# Patient Record
Sex: Female | Born: 1970
Health system: Southern US, Community
[De-identification: ages and names within clinical notes are randomized; demographics above are authoritative.]

## PROBLEM LIST (undated history)

## (undated) DIAGNOSIS — F32A Depression, unspecified: Secondary | ICD-10-CM

## (undated) DIAGNOSIS — M549 Dorsalgia, unspecified: Secondary | ICD-10-CM

## (undated) DIAGNOSIS — E559 Vitamin D deficiency, unspecified: Secondary | ICD-10-CM

## (undated) DIAGNOSIS — K219 Gastro-esophageal reflux disease without esophagitis: Secondary | ICD-10-CM

## (undated) DIAGNOSIS — T7840XA Allergy, unspecified, initial encounter: Secondary | ICD-10-CM

## (undated) DIAGNOSIS — K589 Irritable bowel syndrome without diarrhea: Secondary | ICD-10-CM

## (undated) DIAGNOSIS — N83209 Unspecified ovarian cyst, unspecified side: Secondary | ICD-10-CM

## (undated) DIAGNOSIS — Z15068 Genetic susceptibility to other malignant neoplasm of digestive system: Secondary | ICD-10-CM

## (undated) DIAGNOSIS — F329 Major depressive disorder, single episode, unspecified: Secondary | ICD-10-CM

## (undated) DIAGNOSIS — R600 Localized edema: Secondary | ICD-10-CM

## (undated) DIAGNOSIS — G43909 Migraine, unspecified, not intractable, without status migrainosus: Secondary | ICD-10-CM

## (undated) DIAGNOSIS — J45909 Unspecified asthma, uncomplicated: Secondary | ICD-10-CM

## (undated) DIAGNOSIS — C801 Malignant (primary) neoplasm, unspecified: Secondary | ICD-10-CM

## (undated) DIAGNOSIS — F419 Anxiety disorder, unspecified: Secondary | ICD-10-CM

## (undated) DIAGNOSIS — E669 Obesity, unspecified: Secondary | ICD-10-CM

## (undated) DIAGNOSIS — R0600 Dyspnea, unspecified: Secondary | ICD-10-CM

## (undated) DIAGNOSIS — K760 Fatty (change of) liver, not elsewhere classified: Secondary | ICD-10-CM

## (undated) DIAGNOSIS — Z8041 Family history of malignant neoplasm of ovary: Secondary | ICD-10-CM

## (undated) DIAGNOSIS — K8020 Calculus of gallbladder without cholecystitis without obstruction: Secondary | ICD-10-CM

## (undated) DIAGNOSIS — Z1509 Genetic susceptibility to other malignant neoplasm: Secondary | ICD-10-CM

## (undated) DIAGNOSIS — Z803 Family history of malignant neoplasm of breast: Secondary | ICD-10-CM

## (undated) DIAGNOSIS — Z808 Family history of malignant neoplasm of other organs or systems: Secondary | ICD-10-CM

## (undated) HISTORY — PX: COLONOSCOPY: SHX174

## (undated) HISTORY — DX: Irritable bowel syndrome, unspecified: K58.9

## (undated) HISTORY — DX: Anxiety disorder, unspecified: F41.9

## (undated) HISTORY — DX: Vitamin D deficiency, unspecified: E55.9

## (undated) HISTORY — PX: POLYPECTOMY: SHX149

## (undated) HISTORY — DX: Unspecified asthma, uncomplicated: J45.909

## (undated) HISTORY — DX: Family history of malignant neoplasm of breast: Z80.3

## (undated) HISTORY — DX: Calculus of gallbladder without cholecystitis without obstruction: K80.20

## (undated) HISTORY — DX: Migraine, unspecified, not intractable, without status migrainosus: G43.909

## (undated) HISTORY — DX: Family history of malignant neoplasm of ovary: Z80.41

## (undated) HISTORY — PX: WISDOM TOOTH EXTRACTION: SHX21

## (undated) HISTORY — DX: Dyspnea, unspecified: R06.00

## (undated) HISTORY — DX: Localized edema: R60.0

## (undated) HISTORY — DX: Allergy, unspecified, initial encounter: T78.40XA

## (undated) HISTORY — DX: Family history of malignant neoplasm of other organs or systems: Z80.8

## (undated) HISTORY — DX: Dorsalgia, unspecified: M54.9

## (undated) HISTORY — DX: Fatty (change of) liver, not elsewhere classified: K76.0

## (undated) HISTORY — DX: Obesity, unspecified: E66.9

---

## 1999-06-24 ENCOUNTER — Inpatient Hospital Stay (HOSPITAL_COMMUNITY): Admission: EM | Admit: 1999-06-24 | Discharge: 1999-06-28 | Payer: Self-pay | Admitting: *Deleted

## 1999-07-29 ENCOUNTER — Encounter: Payer: Self-pay | Admitting: Emergency Medicine

## 1999-07-29 ENCOUNTER — Emergency Department (HOSPITAL_COMMUNITY): Admission: EM | Admit: 1999-07-29 | Discharge: 1999-07-29 | Payer: Self-pay | Admitting: Emergency Medicine

## 1999-10-02 ENCOUNTER — Emergency Department (HOSPITAL_COMMUNITY): Admission: EM | Admit: 1999-10-02 | Discharge: 1999-10-02 | Payer: Self-pay

## 1999-12-08 ENCOUNTER — Emergency Department (HOSPITAL_COMMUNITY): Admission: EM | Admit: 1999-12-08 | Discharge: 1999-12-08 | Payer: Self-pay | Admitting: Emergency Medicine

## 2000-06-15 ENCOUNTER — Inpatient Hospital Stay (HOSPITAL_COMMUNITY): Admission: AD | Admit: 2000-06-15 | Discharge: 2000-06-15 | Payer: Self-pay | Admitting: Obstetrics and Gynecology

## 2000-06-15 ENCOUNTER — Encounter: Payer: Self-pay | Admitting: Obstetrics and Gynecology

## 2000-10-20 ENCOUNTER — Other Ambulatory Visit: Admission: RE | Admit: 2000-10-20 | Discharge: 2000-10-20 | Payer: Self-pay | Admitting: Obstetrics and Gynecology

## 2000-10-25 ENCOUNTER — Emergency Department (HOSPITAL_COMMUNITY): Admission: EM | Admit: 2000-10-25 | Discharge: 2000-10-26 | Payer: Self-pay | Admitting: Emergency Medicine

## 2000-10-26 ENCOUNTER — Encounter: Payer: Self-pay | Admitting: Emergency Medicine

## 2000-11-07 ENCOUNTER — Ambulatory Visit (HOSPITAL_COMMUNITY): Admission: RE | Admit: 2000-11-07 | Discharge: 2000-11-07 | Payer: Self-pay | Admitting: Obstetrics and Gynecology

## 2000-11-07 ENCOUNTER — Encounter: Payer: Self-pay | Admitting: Obstetrics and Gynecology

## 2001-11-21 ENCOUNTER — Other Ambulatory Visit: Admission: RE | Admit: 2001-11-21 | Discharge: 2001-11-21 | Payer: Self-pay | Admitting: Obstetrics and Gynecology

## 2003-03-10 ENCOUNTER — Other Ambulatory Visit: Admission: RE | Admit: 2003-03-10 | Discharge: 2003-03-10 | Payer: Self-pay | Admitting: Obstetrics and Gynecology

## 2003-04-17 ENCOUNTER — Inpatient Hospital Stay (HOSPITAL_COMMUNITY): Admission: AD | Admit: 2003-04-17 | Discharge: 2003-04-17 | Payer: Self-pay | Admitting: Obstetrics and Gynecology

## 2003-05-05 ENCOUNTER — Ambulatory Visit (HOSPITAL_COMMUNITY): Admission: RE | Admit: 2003-05-05 | Discharge: 2003-05-05 | Payer: Self-pay | Admitting: Obstetrics and Gynecology

## 2003-05-05 IMAGING — US US OB COMP +14 WK
1 series · 13 of 28 positions shown · non-contrast
Comparison: none

CLINICAL DATA: Assess fetal anatomy.

[Series 1: unknown · 0.28mm/px · 13 of 81 slices shown]
[im 3/81]
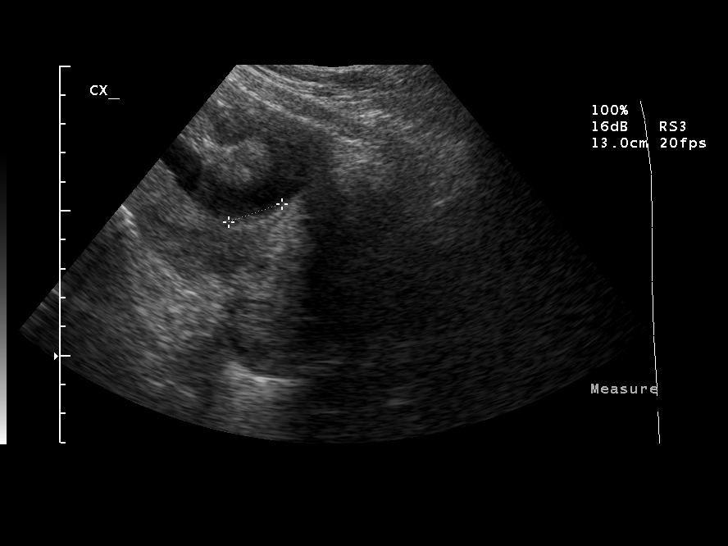
[im 9/81]
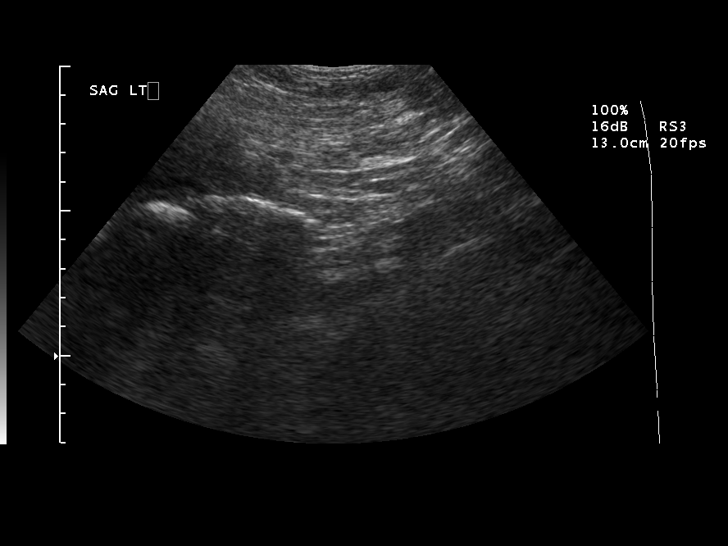
[im 15/81]
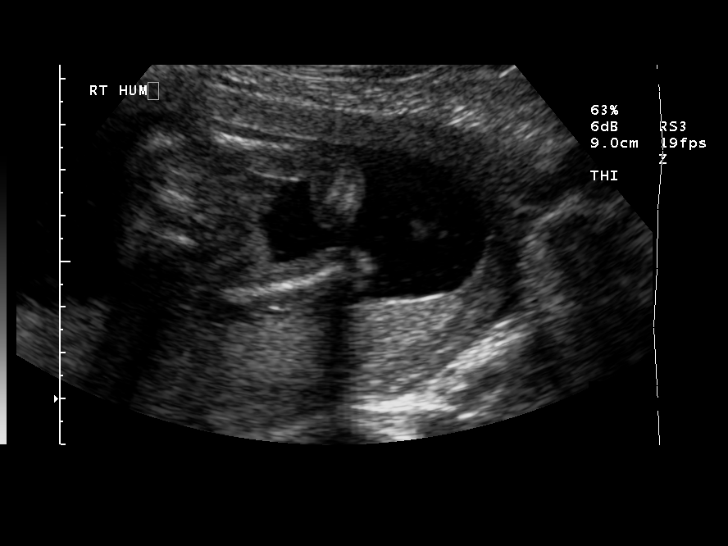
[im 21/81]
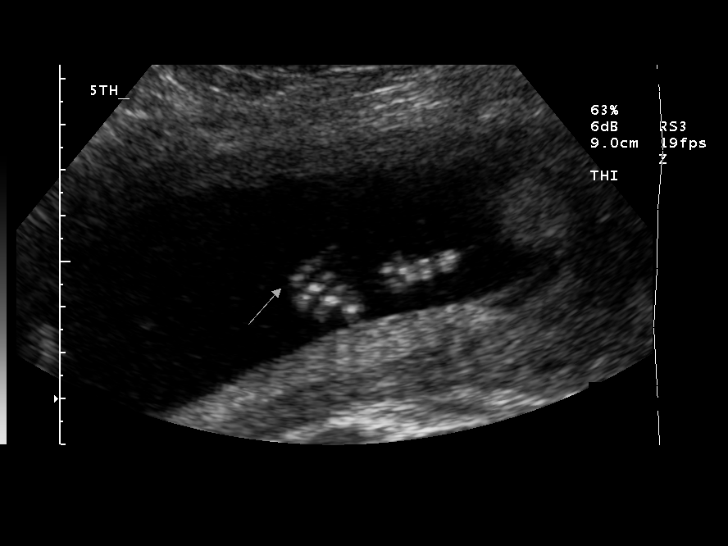
[im 27/81]
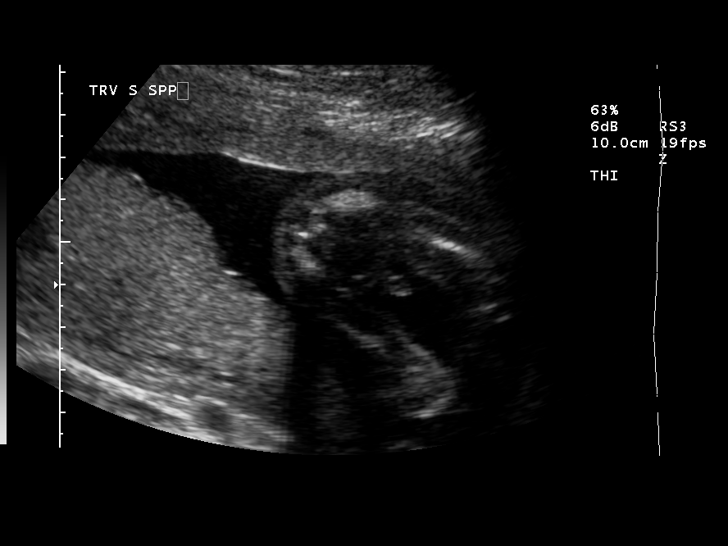
[im 33/81]
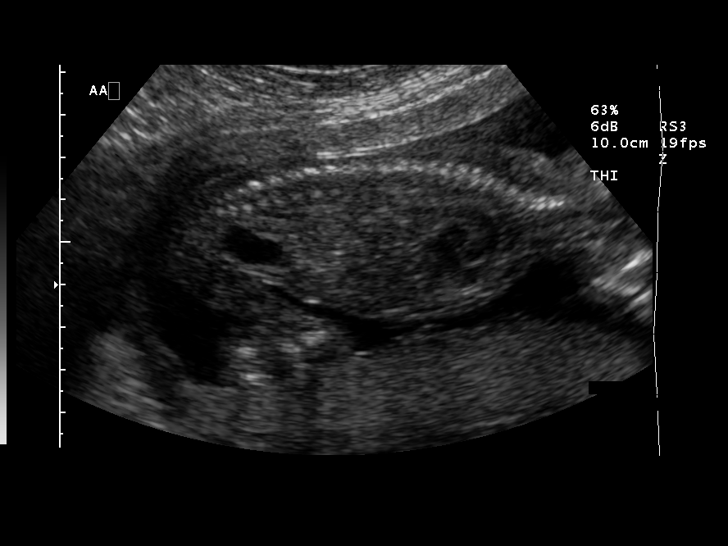
[im 42/81]
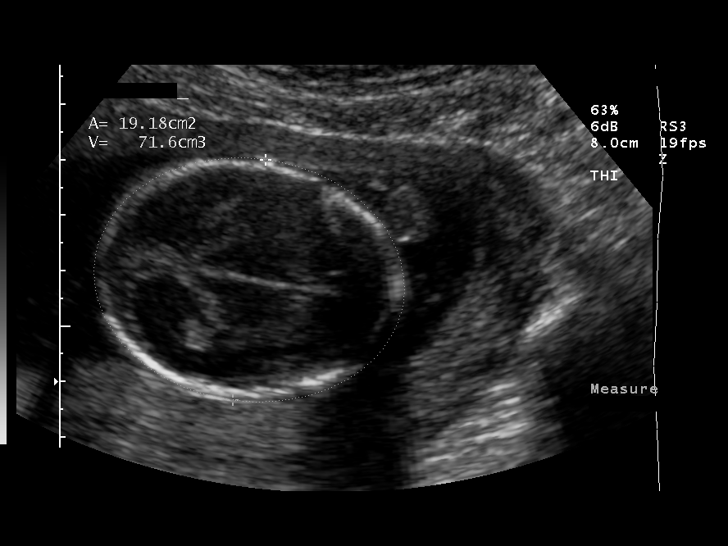
[im 48/81]
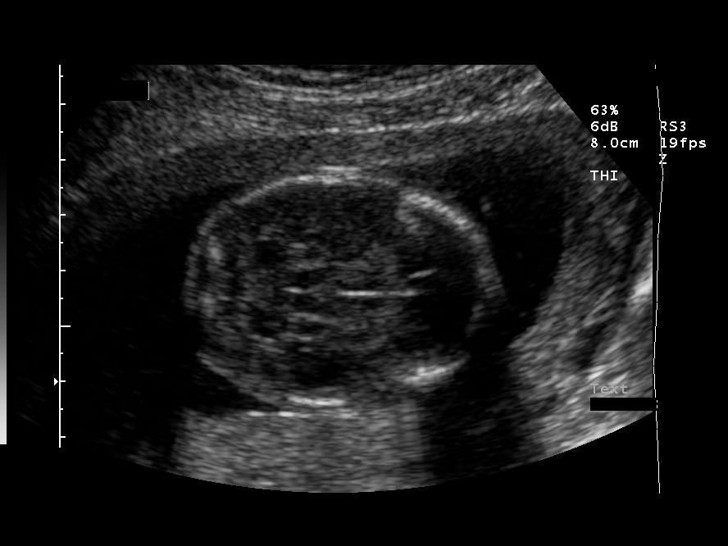
[im 54/81]
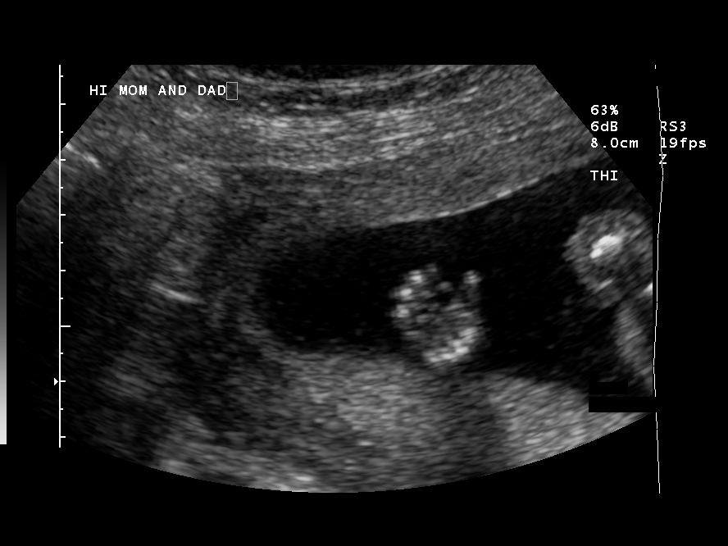
[im 60/81]
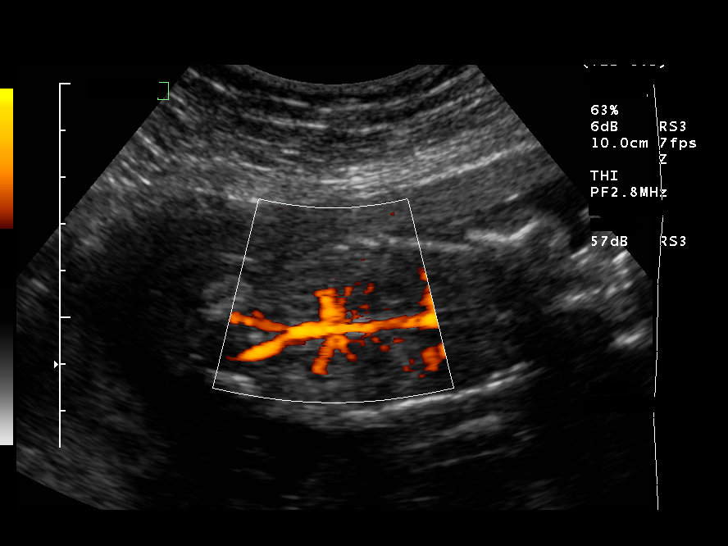
[im 66/81]
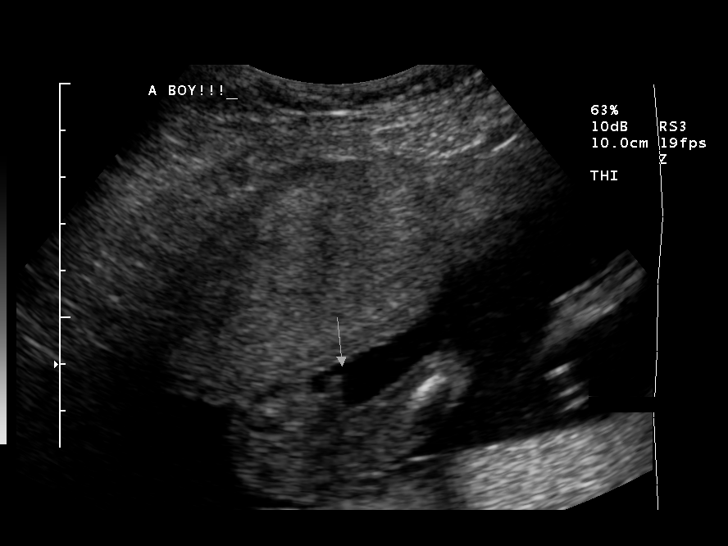
[im 72/81]
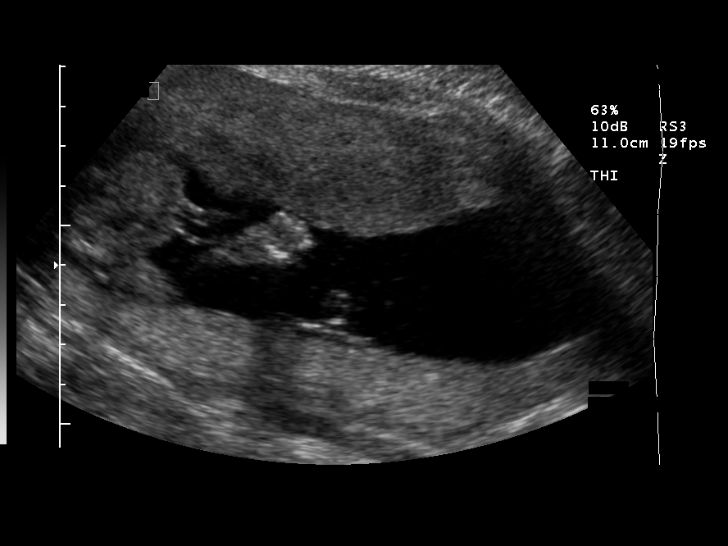
[im 78/81]
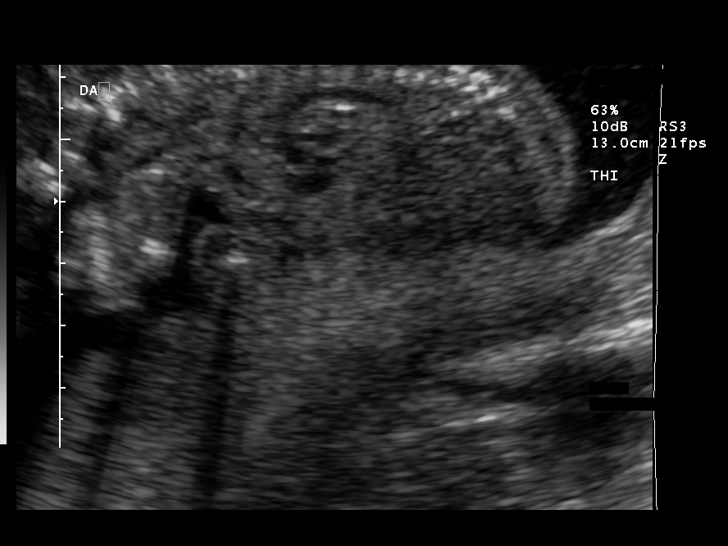

[13 of 28 positions shown; findings below may reference images not displayed]

OBSTETRICAL ULTRASOUND
Number of Fetuses:  1
Heart Rate:  137
Movement:  Yes
Breathing:  No  
Presentation:  Cephalic
Placental Location:  Posterior, low-lying 
Grade:  I
Previa:  No
Comment:  Noted is the presence of low-lying placenta with the inferior margin currently lying 1.9 cm from the internal cervical os.
Amniotic Fluid (Subjective):  Normal
Amniotic Fluid (Objective):   3.4 cm Vertical pocket 

FETAL BIOMETRY
BPD:  4.2 cm  18 w 4 d
HC:  15.7 cm  18 w 4 d
AC:  13.1 cm  18 w 5 d
FL:  2.9 cm  18 w 6 d

MEAN GA:  18 w 5 d
GA  BY LMP:  18 w 1 d

FETAL ANATOMY
Lateral Ventricles:    Visualized 
Thalami/CSP:      Visualized 
Posterior Fossa:  Visualized 
Nuchal Region:    Visualized 
Spine:      Visualized 
4 Chamber Heart on Left:      Visualized 
Stomach on Left:      Visualized 
3 Vessel Cord:    Visualized 
Cord Insertion site:    Visualized 
Kidneys:  Visualized 
Bladder:  Visualized 
Extremities:      Visualized 

ADDITIONAL ANATOMY VISUALIZED:  LVOT, RVOT, upper lip, orbits, profile, diaphragm, heel, 5th digit, ductal arch, aortic arch, and male genitalia

MATERNAL FINDINGS
Cervix:   3.2 cm Transabdominally
IMPRESSION: Single intrauterine pregnancy demonstrating an estimated gestational age by ultrasound of 18 weeks and 5 days.  Correlation with assigned gestational age by LMP of 18 weeks and 1 day suggests appropriate growth.  
No focal fetal abnormalities are noted with a good anatomic exam possible.  
Current low-lying placental position.  Follow-up evaluation at greater than 28 weeks is recommended for initial short term reassessment.

## 2003-07-07 ENCOUNTER — Inpatient Hospital Stay (HOSPITAL_COMMUNITY): Admission: AD | Admit: 2003-07-07 | Discharge: 2003-07-07 | Payer: Self-pay | Admitting: Obstetrics and Gynecology

## 2003-08-08 ENCOUNTER — Emergency Department (HOSPITAL_COMMUNITY): Admission: EM | Admit: 2003-08-08 | Discharge: 2003-08-08 | Payer: Self-pay | Admitting: Emergency Medicine

## 2003-09-15 ENCOUNTER — Inpatient Hospital Stay (HOSPITAL_COMMUNITY): Admission: AD | Admit: 2003-09-15 | Discharge: 2003-09-15 | Payer: Self-pay | Admitting: Obstetrics and Gynecology

## 2003-10-08 ENCOUNTER — Inpatient Hospital Stay (HOSPITAL_COMMUNITY): Admission: AD | Admit: 2003-10-08 | Discharge: 2003-10-12 | Payer: Self-pay | Admitting: Obstetrics and Gynecology

## 2003-10-08 IMAGING — US US OB FOLLOW-UP
1 series · 13 of 25 positions shown · non-contrast
Comparison: none

CLINICAL DATA: Postdates.  Assess growth, amniotic fluid volume, and fetal well being.  G3 P2.  LMP [DATE].

[Series 1: unknown · 0.32mm/px · 13 of 25 slices shown]
[im 1/25]
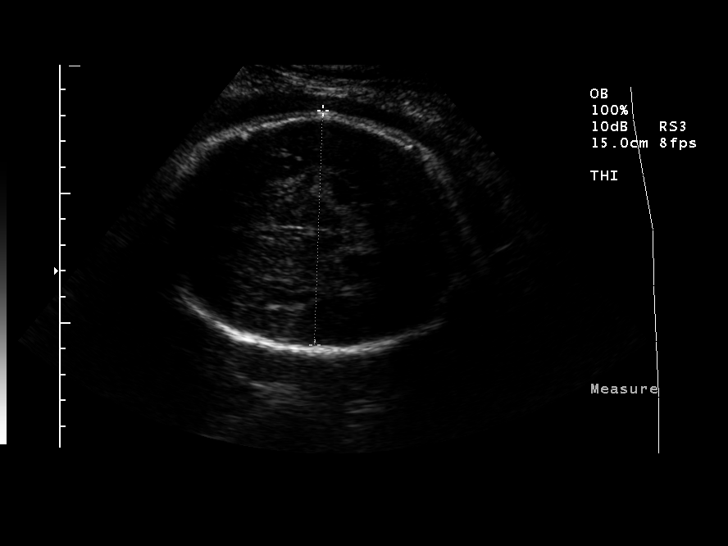
[im 3/25]
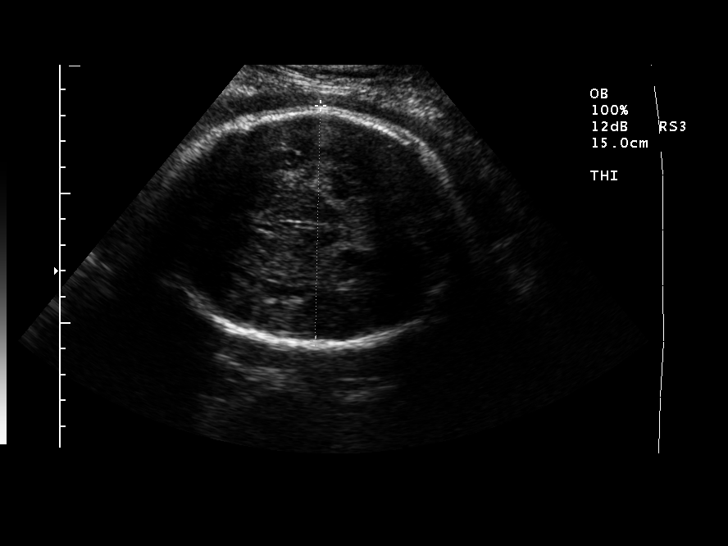
[im 5/25]
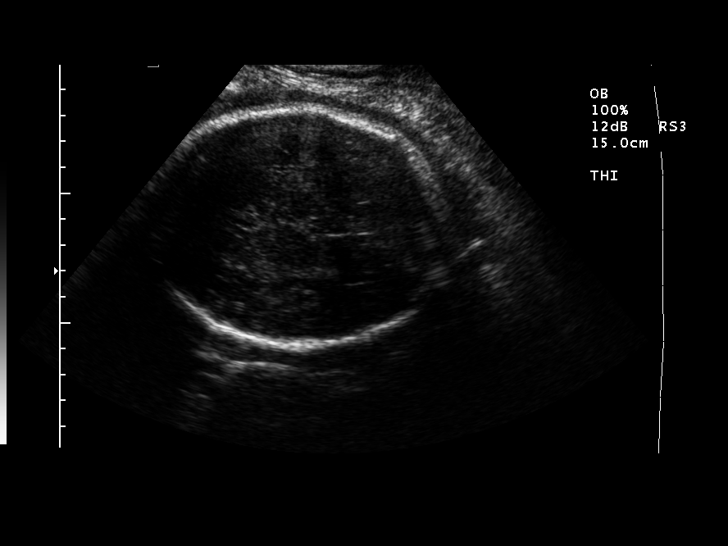
[im 7/25]
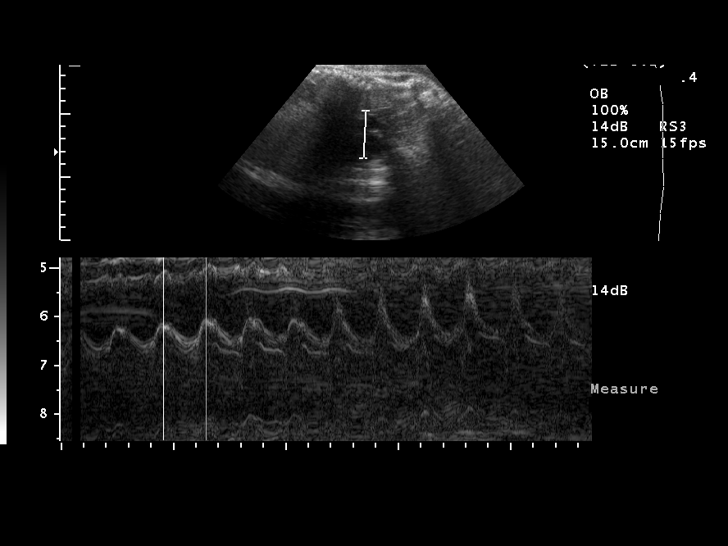
[im 9/25]
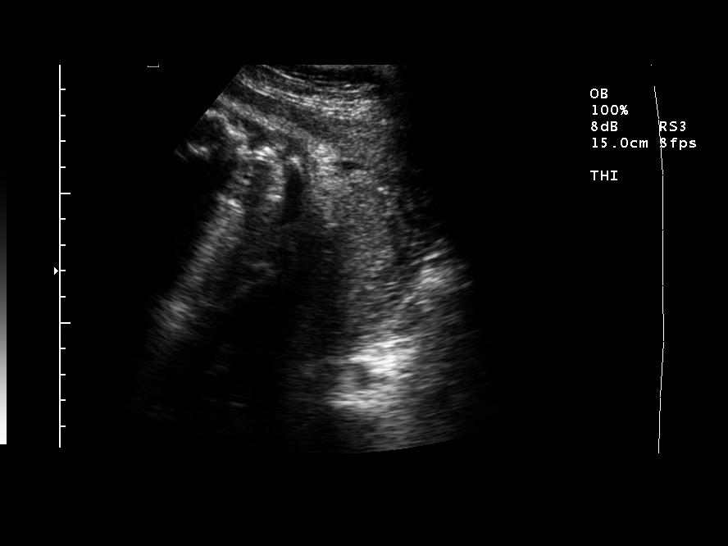
[im 11/25]
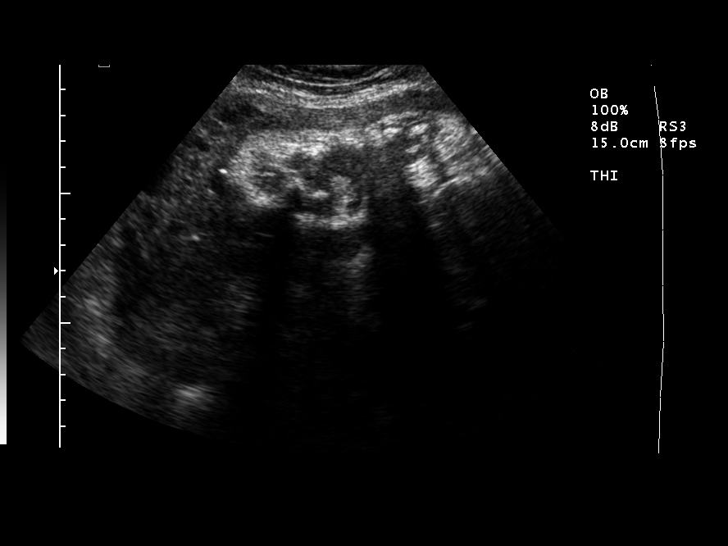
[im 13/25]
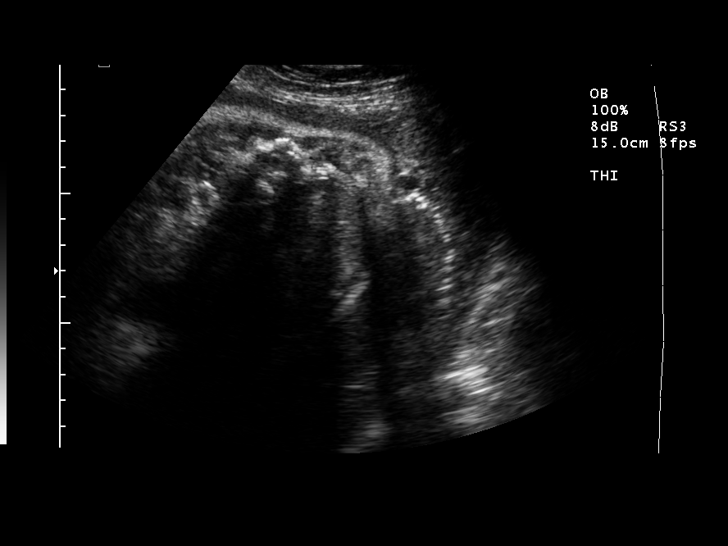
[im 15/25]
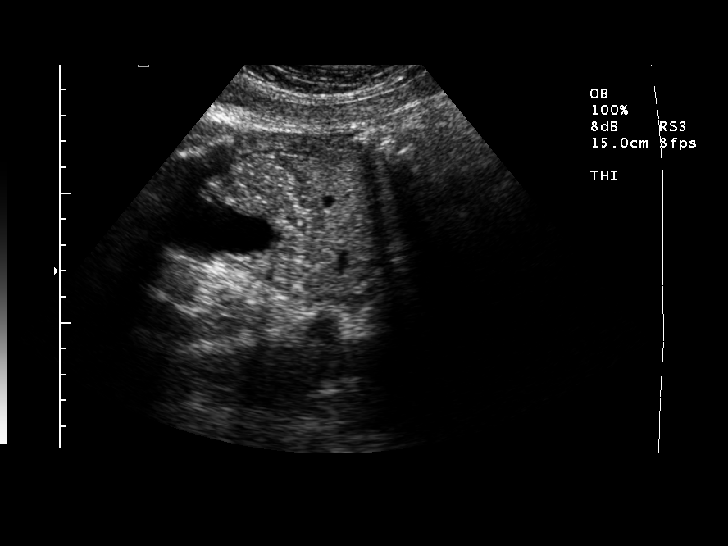
[im 17/25]
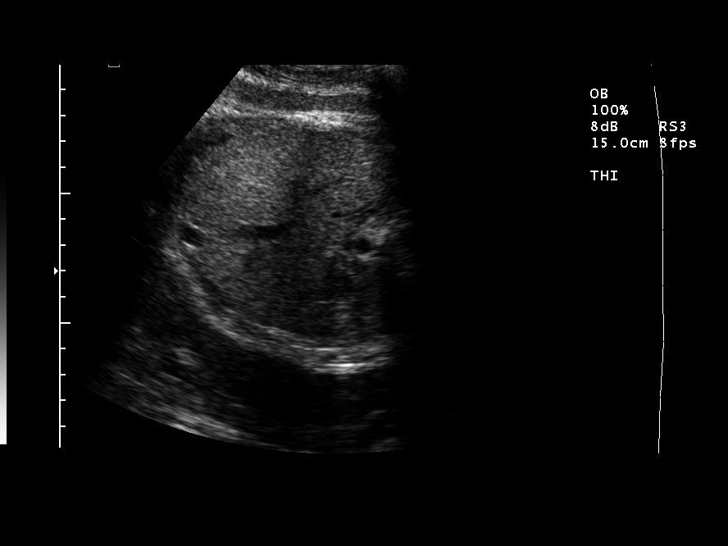
[im 19/25]
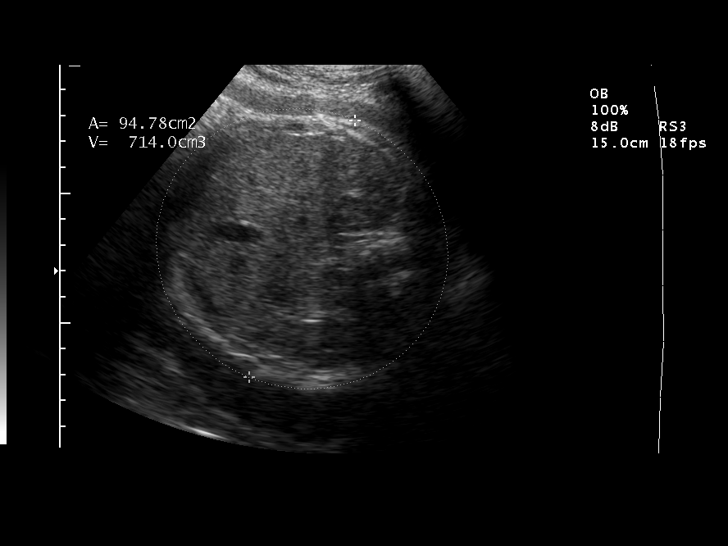
[im 21/25]
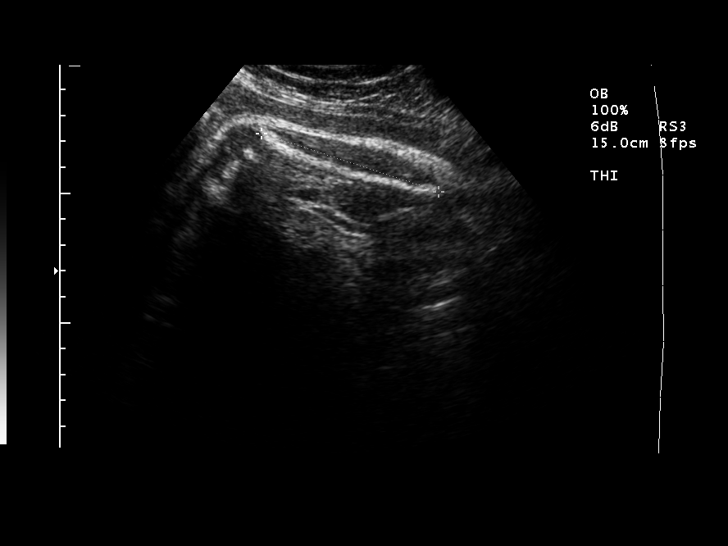
[im 23/25]
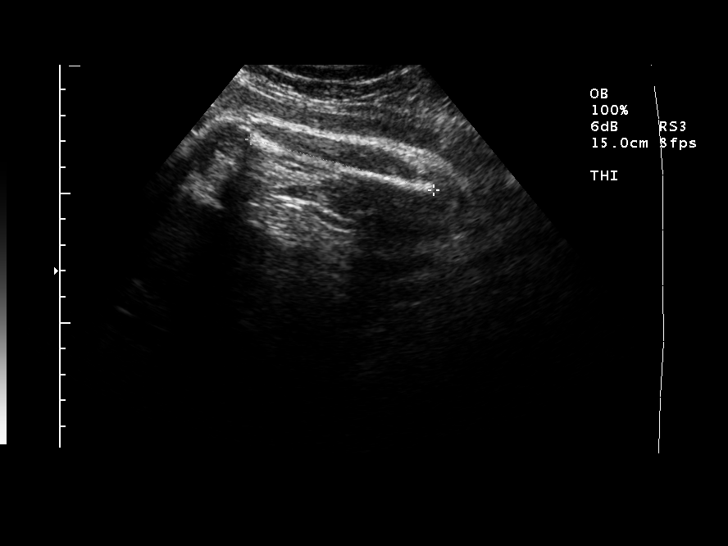
[im 25/25]
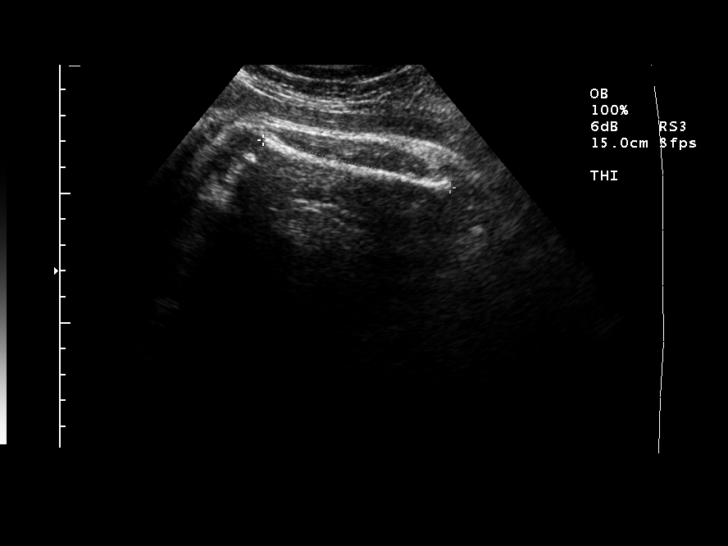

[13 of 25 positions shown; findings below may reference images not displayed]

OBSTETRICAL ULTRASOUND RE-EVALUATION
Number of Fetuses:  1
Heart Rate:  150 BPM
Movement:  Yes
Breathing:  Yes
Presentation:  Cephalic
Placental Location:  Posterior
Grade:  II
Previa:  No
Amniotic Fluid (subjective):  Decreased
Amniotic Fluid (objective):  AFI 0 cm ([JN] %ile = 7.1 to 21.4 cm for 40 weeks) 

FETAL BIOMETRY
BPD:  9.1 cm, 36 W 5 D
HC:  34.2 cm, 39 W 3 D
AC:  34.2 cm, 38 W 1D
FL:  7.4 cm, 37 W 5D

Mean GA:      38 W 0 D
Assigned GA:      40 W 5 D

EFW:  [JN] grams (H) [JN] %ile ([JN] ? [JN] g) for 40 weeks 

FETAL ANATOMY
Lateral Ventricles:  Visualized 
Thalami/CSP:  Previously seen 
Posterior Fossa:  Previously seen 
Nuchal Region:  Previously seen 
Spine:  Previously seen 
4 Chamber Heart on Left:  Previously seen   
Stomach on Left:  Visualized 
3 Vessel Cord:  Previously seen 
Cord Insertion Site:  Previously seen 
Kidneys:  Visualized 
Bladder:  Visualized 
Extremities:    Previously seen 

MATERNAL FINDINGS
Cervix:  Not evaluated.
IMPRESSION: Single living intrauterine fetus in cephalic presentation.  Patient is 40 weeks 3 days by LMP dating and measures 38 weeks today with a fetal weight of [JN] grams, falling between the 50th and 75th percentile for 40 weeks.  Growth is appropriate.
Markedly decreased amniotic fluid volume with AFI of 0.
BIOPHYSICAL PROFILE

Movement:  2  Time:  20 minutes
Breathing:  2
Tone:  2
Amniotic Fluid:  0

Total Score:  [DATE]
IMPRESSION 
Biophysical profile score is [DATE] in 20 minutes of scanning, with two points deducted for lack of a 2 cm pocket of fluid.

## 2003-10-09 ENCOUNTER — Encounter (INDEPENDENT_AMBULATORY_CARE_PROVIDER_SITE_OTHER): Payer: Self-pay | Admitting: *Deleted

## 2003-10-09 IMAGING — CR DG ABD PORTABLE 1V
1 series · 1 of 1 positions shown · non-contrast
Comparison: none

CLINICAL DATA: Status post stat C-section.  No pre instrument count.
 KUB, [DATE]
 Surgical clips are identified over the inferior portion of the abdomen.  No radiopaque foreign bodies are noted.  Stool is present throughout the colon.  Bony structures are intact.
 IMPRESSION 
 No radiographic evidence of foreign body.

[view not recorded]
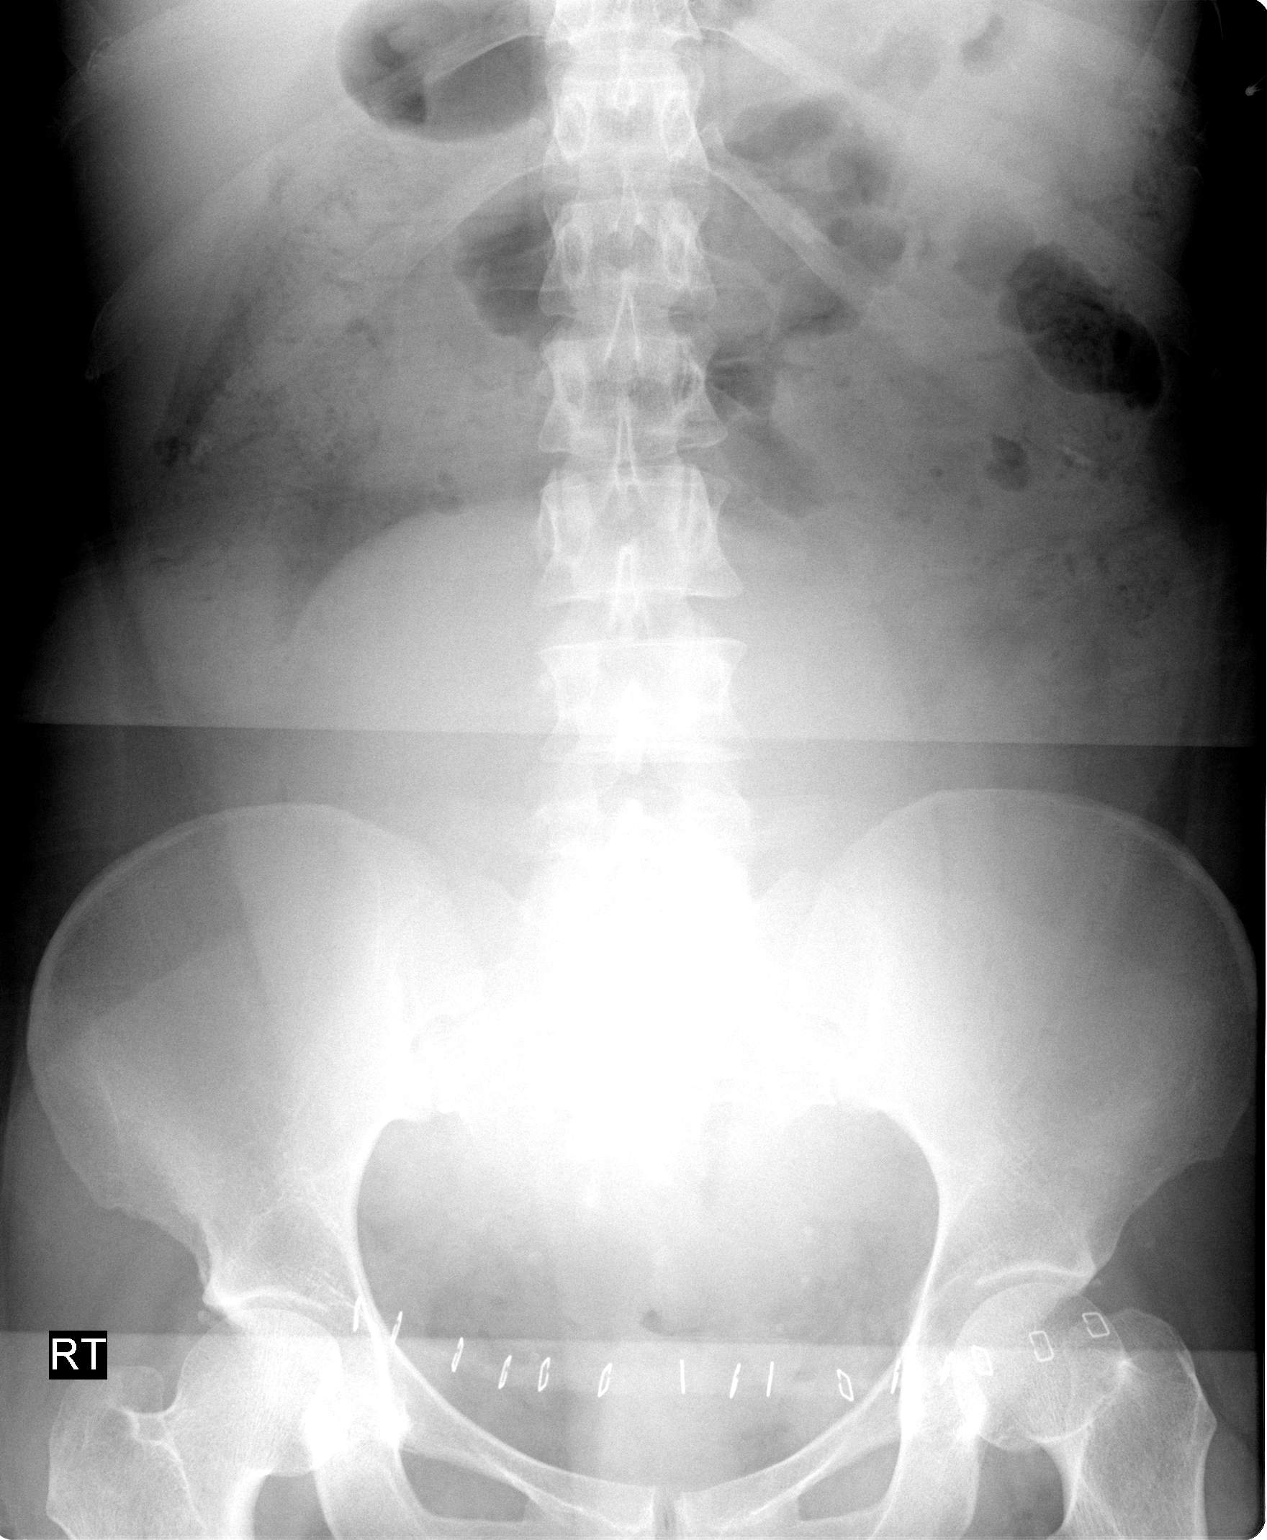

[1 of 1 positions shown; findings below may reference images not displayed]

## 2003-11-03 ENCOUNTER — Encounter: Admission: RE | Admit: 2003-11-03 | Discharge: 2003-12-03 | Payer: Self-pay | Admitting: Obstetrics and Gynecology

## 2003-12-31 ENCOUNTER — Other Ambulatory Visit: Admission: RE | Admit: 2003-12-31 | Discharge: 2003-12-31 | Payer: Self-pay | Admitting: Obstetrics and Gynecology

## 2004-11-09 ENCOUNTER — Encounter: Admission: RE | Admit: 2004-11-09 | Discharge: 2004-11-09 | Payer: Self-pay | Admitting: Obstetrics and Gynecology

## 2007-01-17 ENCOUNTER — Other Ambulatory Visit
Admission: RE | Admit: 2007-01-17 | Discharge: 2007-01-17 | Payer: Self-pay | Admitting: Physical Medicine & Rehabilitation

## 2007-10-01 ENCOUNTER — Emergency Department (HOSPITAL_COMMUNITY): Admission: EM | Admit: 2007-10-01 | Discharge: 2007-10-02 | Payer: Self-pay | Admitting: Emergency Medicine

## 2007-10-01 IMAGING — CT CT PELVIS W/ CM
2 of 5 series · 17 of 46 positions shown, 19 images · IV contrast (APPLIED)
Comparison: None available

CT ABDOMEN

CLINICAL DATA: Abdominal pain radiating to the back.  Pain on both
sides.  Nausea and vomiting.

CT ABDOMEN AND PELVIS WITH CONTRAST
TECHNIQUE: Multidetector CT imaging of the abdomen and pelvis was
performed using the standard protocol following bolus
administration of intravenous contrast.
Contrast: 100 ml [9X].  Omnipaque and water S P O
contrast.

[Series 2: abd/pelv with 5.0 b31f st · axial · 0.79mm/px · z∈[-258,+167]mm · 14 of 97 slices shown, 16 images]
[im 6/97  soft-tissue]
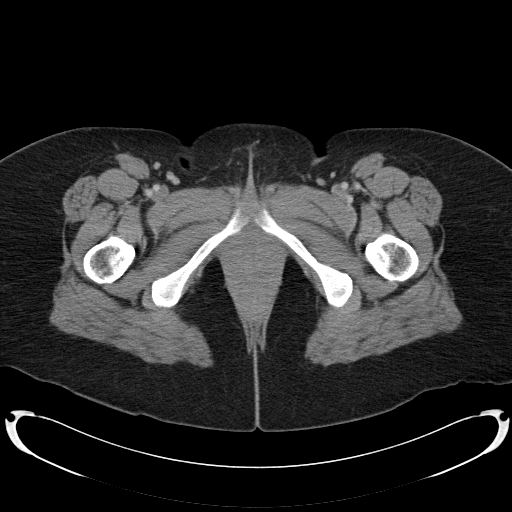
[im 6/97  bone]
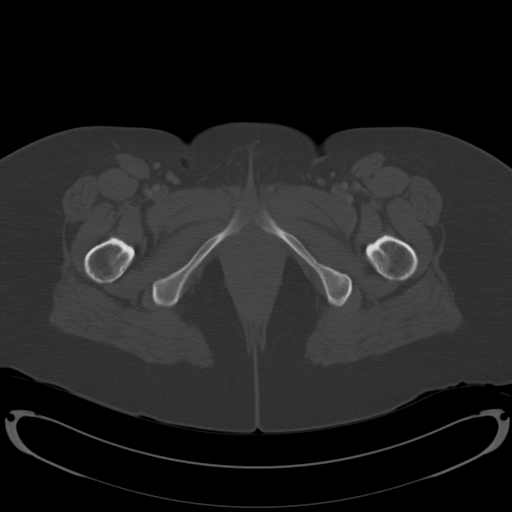
[im 11/97  soft-tissue]
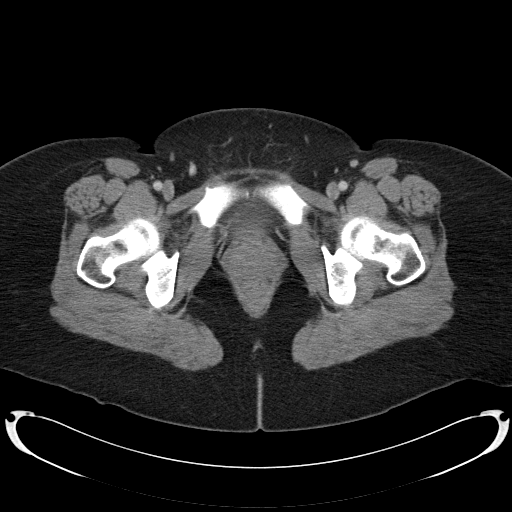
[im 22/97  soft-tissue]
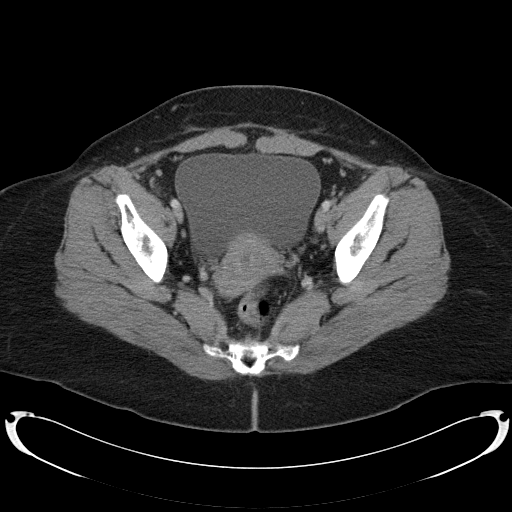
[im 27/97  soft-tissue]
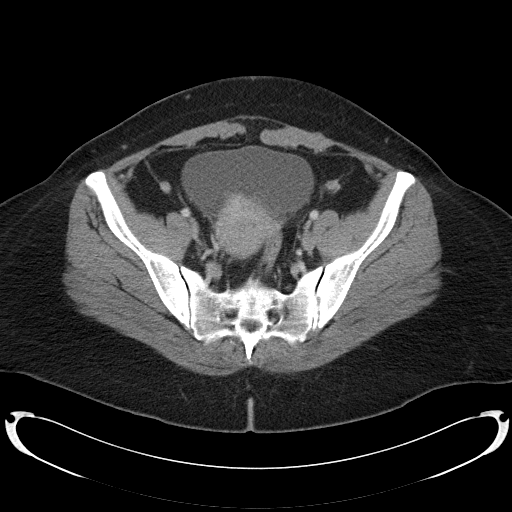
[im 33/97  soft-tissue]
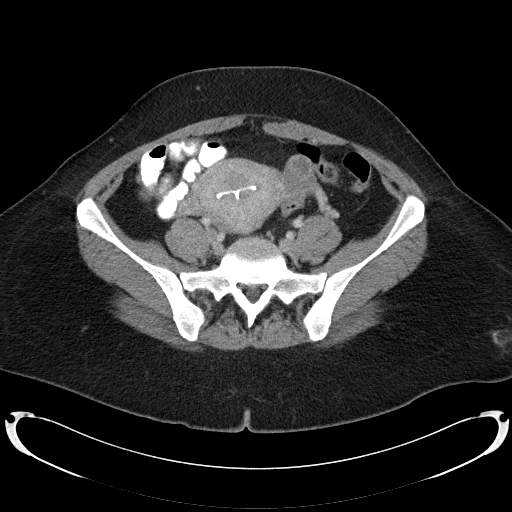
[im 38/97  soft-tissue]
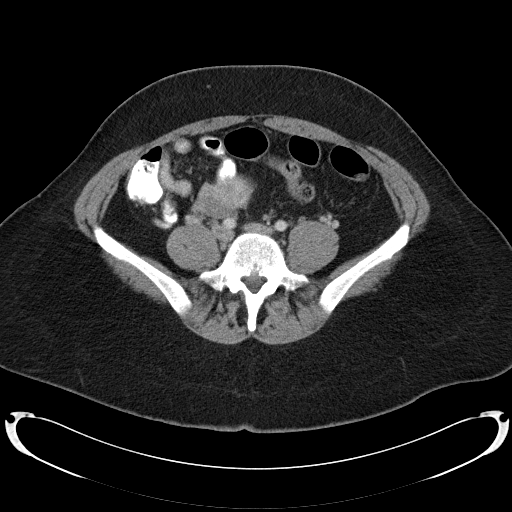
[im 43/97  soft-tissue]
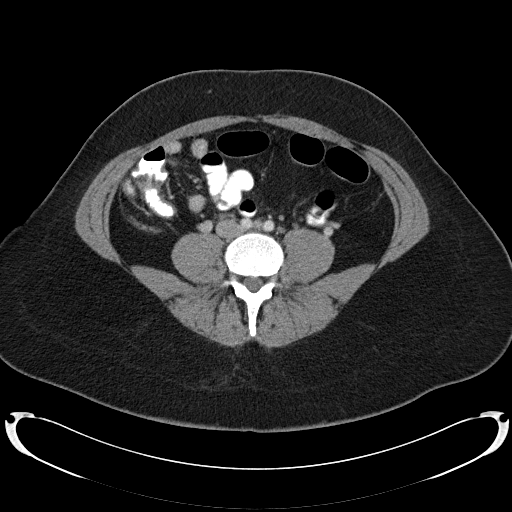
[im 54/97  soft-tissue]
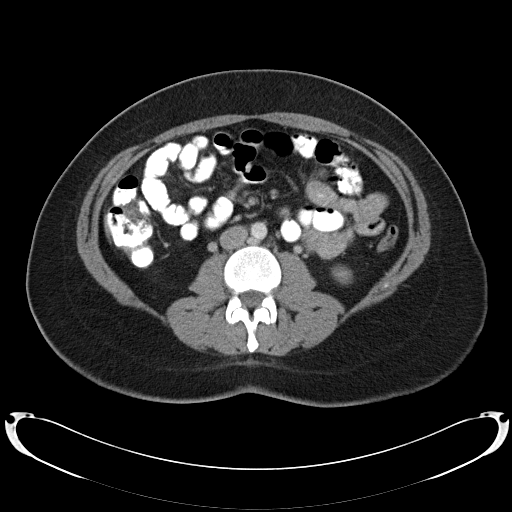
[im 59/97  soft-tissue]
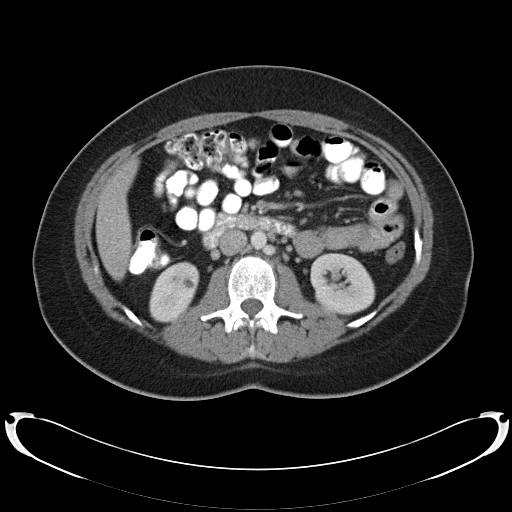
[im 59/97  bone]
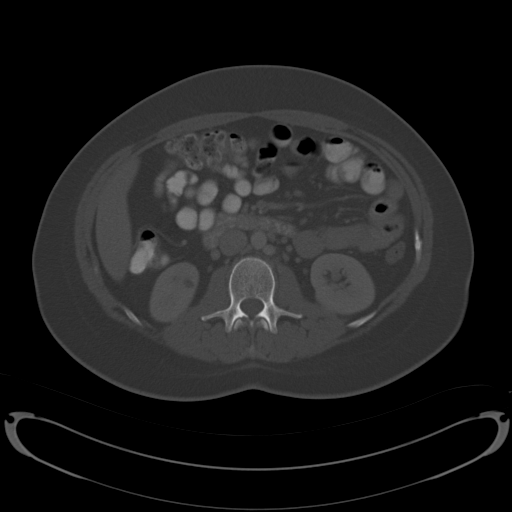
[im 65/97  soft-tissue]
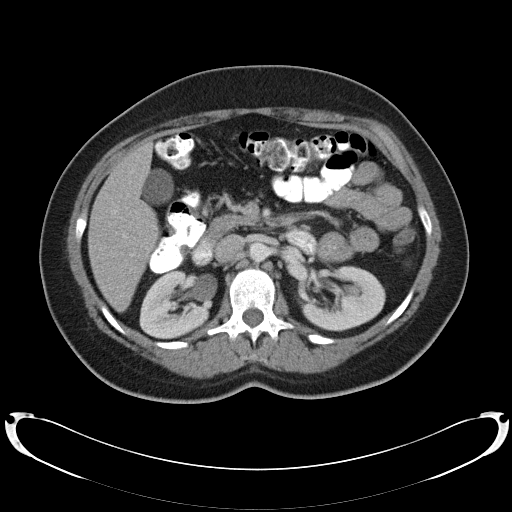
[im 70/97  soft-tissue]
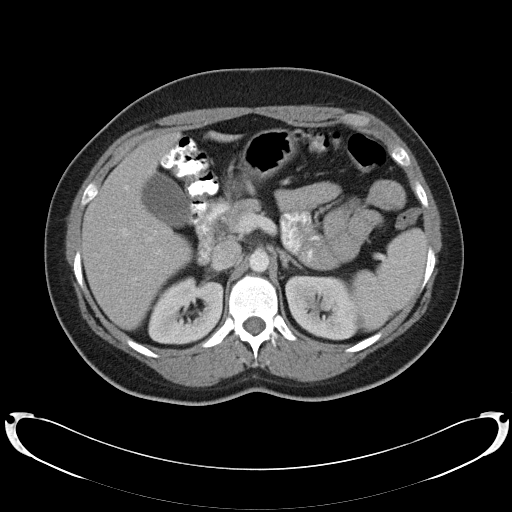
[im 75/97  soft-tissue]
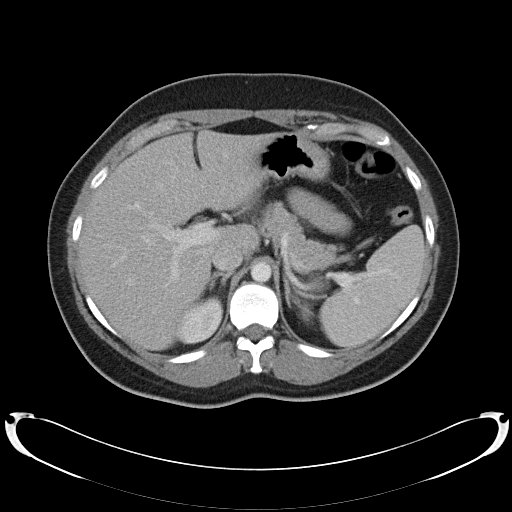
[im 86/97  soft-tissue]
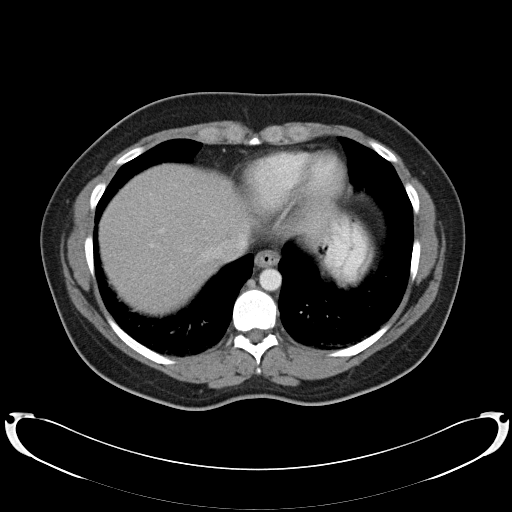
[im 91/97  soft-tissue]
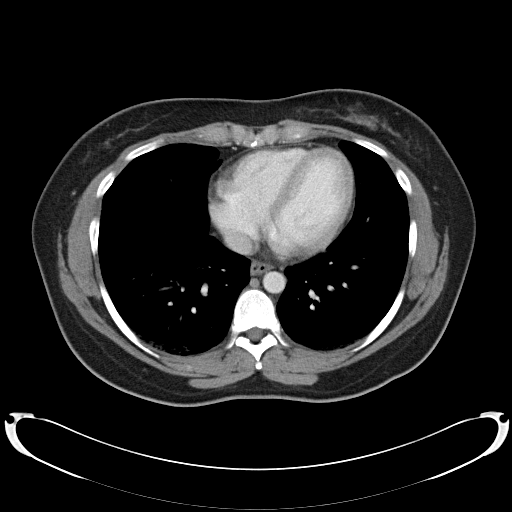

[Series 5: abd/pelv with 2.0 spo cor st · coronal · 0.94mm/px · 3 of 121 slices shown]
[im 41/121  soft-tissue]
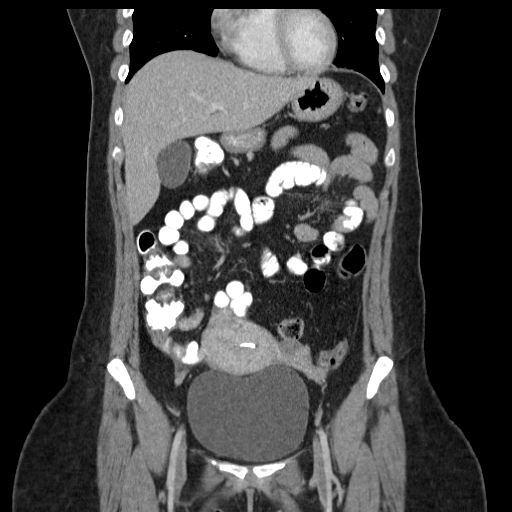
[im 54/121  soft-tissue]
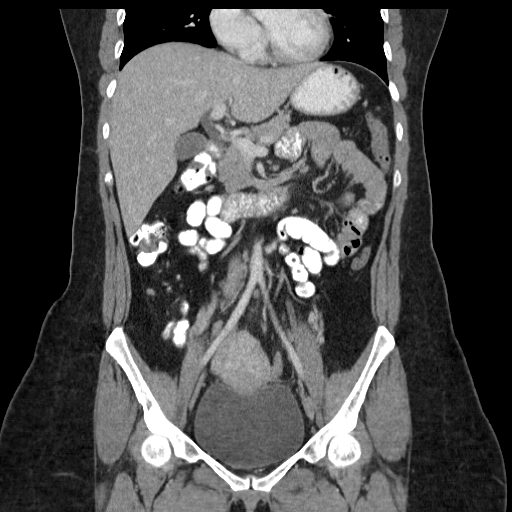
[im 67/121  soft-tissue]
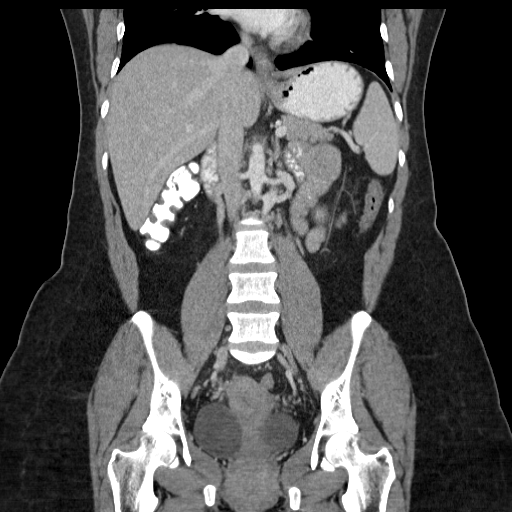

[17 of 46 positions shown; findings below may reference images not displayed]

FINDINGS: Dependent atelectasis is present in the lung bases.
Liver, gallbladder, liver, gallbladder and spleen appear within
normal limits.  Adrenals bilaterally are normal.  Simple left upper
and right lower pole renal cysts, each measuring under 1 cm.
Normal renal excretion of contrast on delayed images.  Mild
prominence of the distal common bile duct at 8 mm, which may relate
to prior passage of stone.  Just before entering the pancreas, it
measures 1 cm in diameter, which is abnormal for a patient with the
gallbladder.  It is difficult to exclude biliary obstruction.
Correlation with liver function studies is recommended, and a
follow-up MRCP may be useful. Other considerations include
choledochal cyst.  Stomach and proximal small bowel appears within
normal limits.  Small periaortic lymph nodes are identified,
without pathologic enlargement.
IMPRESSION: 1.  Dilation of the common bile duct.  Correlate with liver enzymes
as described above.  Consider follow-up abdominal ultrasound or
MRCP.
2.  No other significant abdominal abnormalities accounting for
pain.
3.  Bilateral simple renal cysts.

CT PELVIS
FINDINGS: Normal appendix is identified.  IUD is present within
the uterus in good position, without complicating features.
Physiologic appearance of the uterus and adnexa.  No free fluid is
identified.  Urinary bladder appears within normal limits.  Bone
windows appear within normal limits.
IMPRESSION: No acute pelvic abnormality.  IUD in good position.

## 2007-10-02 IMAGING — US US ABDOMEN COMPLETE
1 series · 14 of 25 positions shown · non-contrast
Comparison: CT of 1 day prior

CLINICAL DATA: Abdominal pain radiating to the back.

ABDOMEN ULTRASOUND
TECHNIQUE: Complete abdominal ultrasound examination was performed
including evaluation of the liver, gallbladder, bile ducts,
pancreas, kidneys, spleen, IVC, and abdominal aorta.

[Series 1: unknown · 0.34mm/px · 14 of 57 slices shown]
[im 1/57]
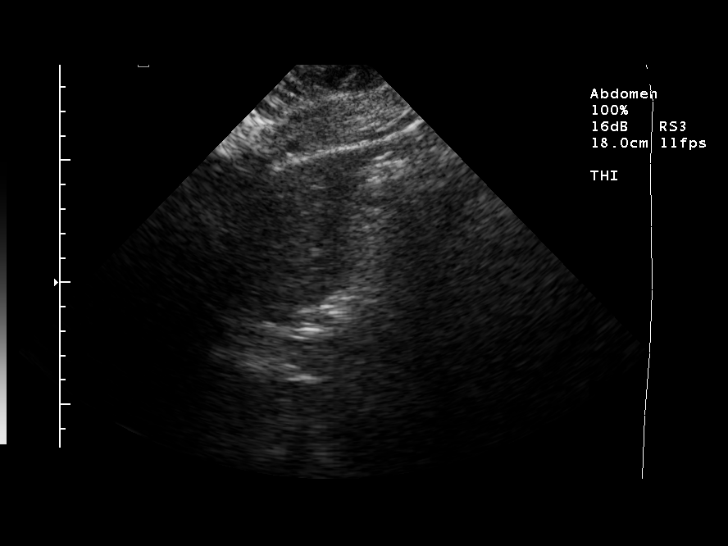
[im 5/57]
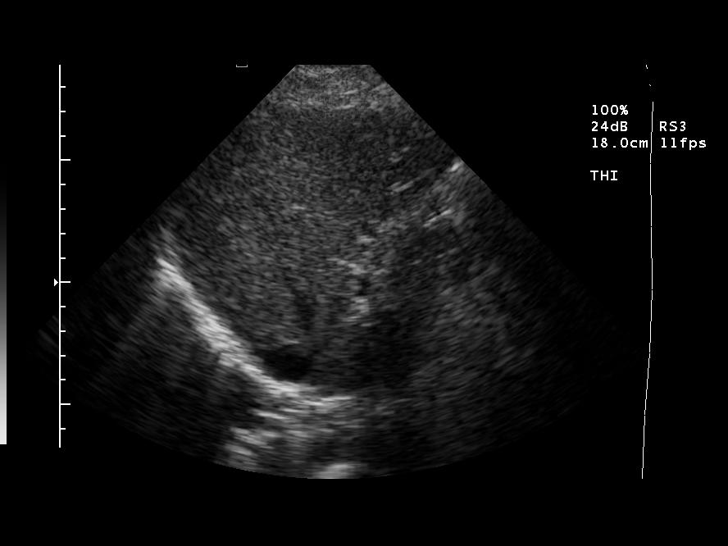
[im 10/57]
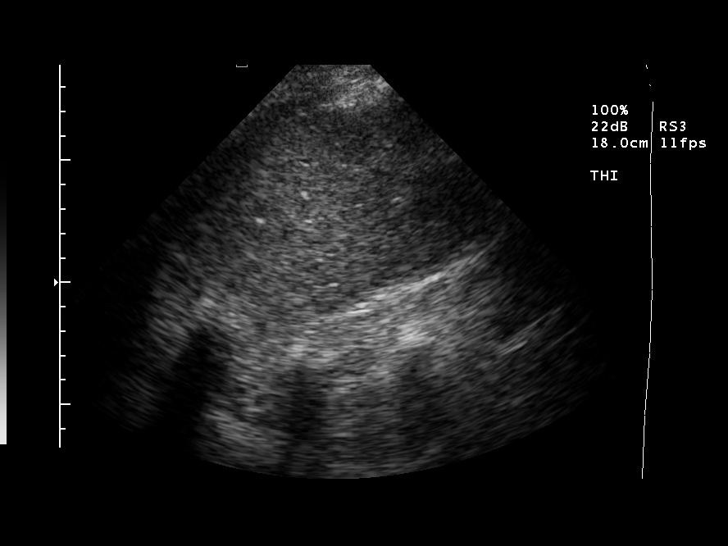
[im 15/57]
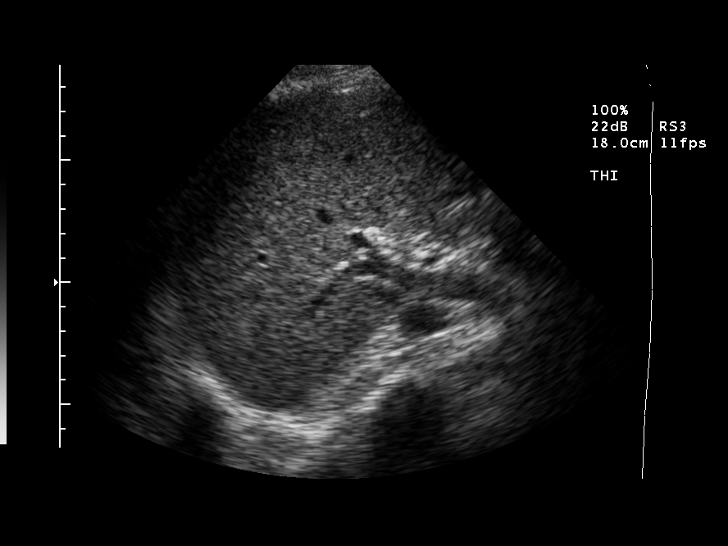
[im 19/57]
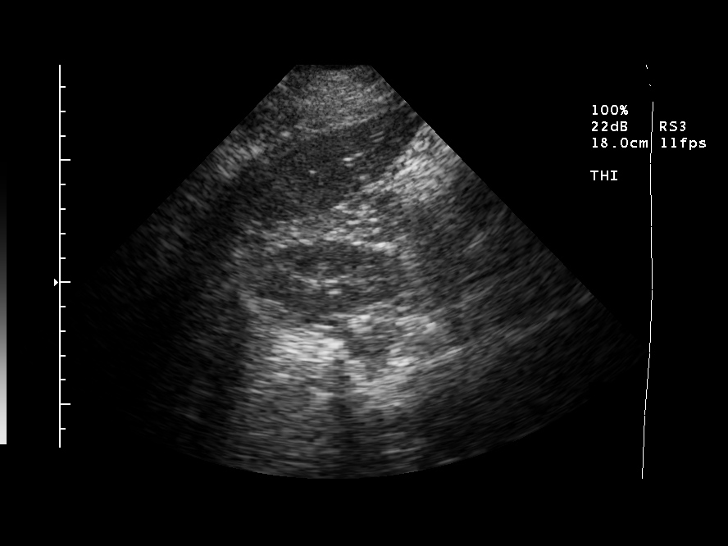
[im 22/57]
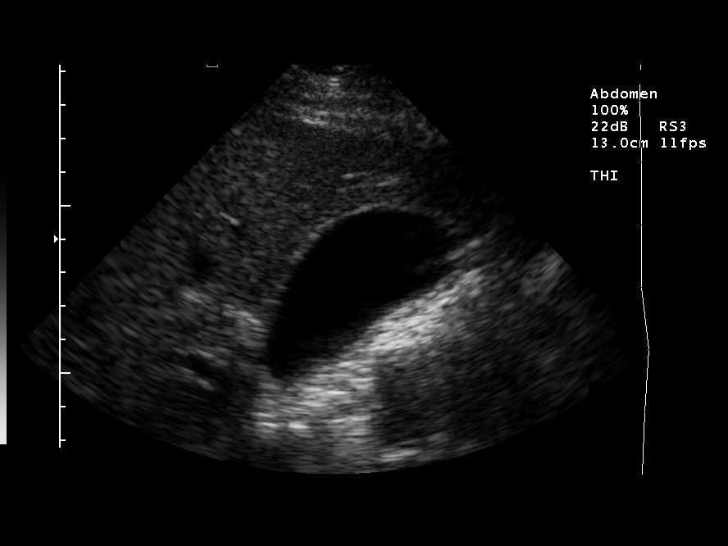
[im 26/57]
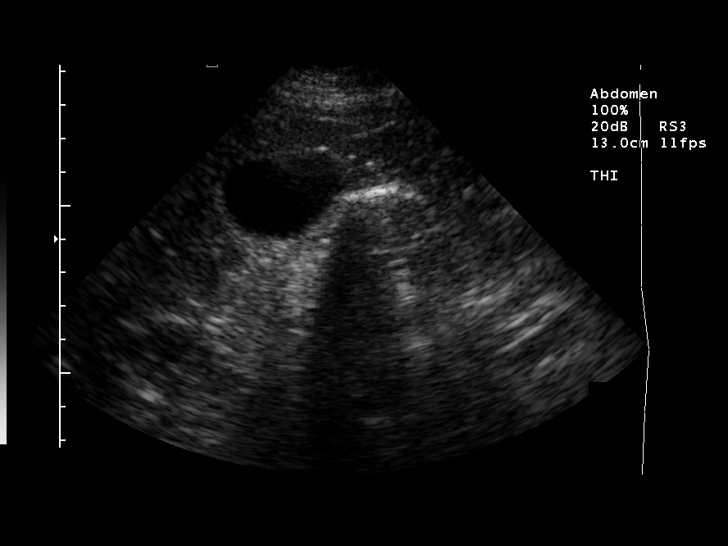
[im 31/57]
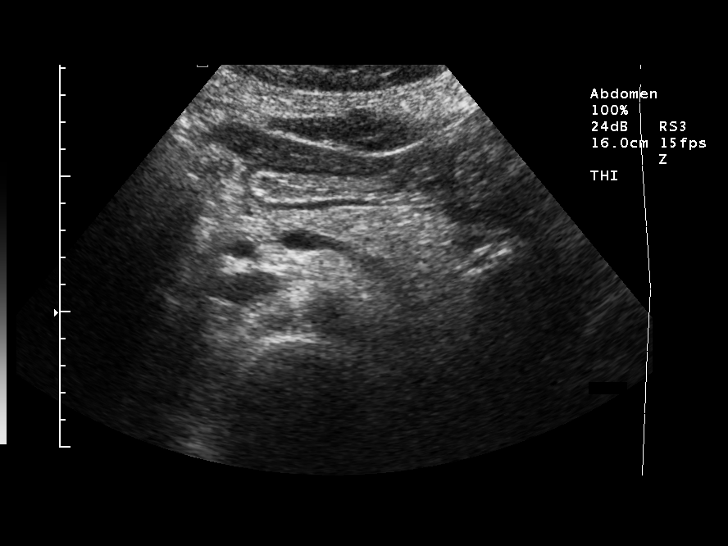
[im 36/57]
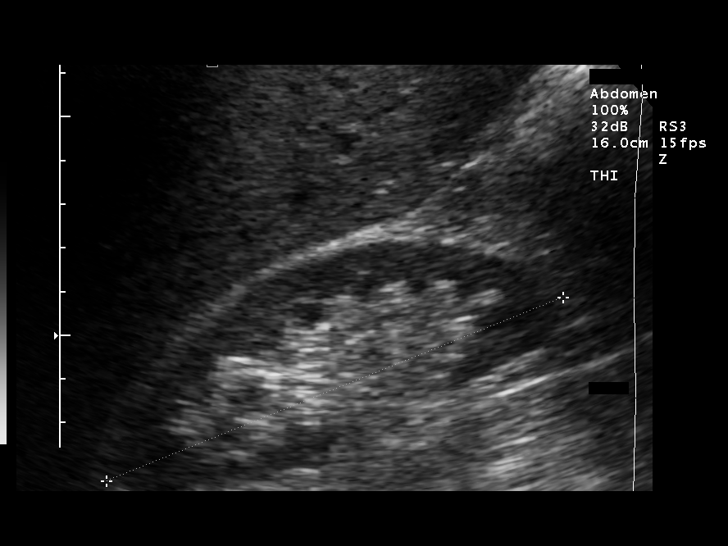
[im 38/57]
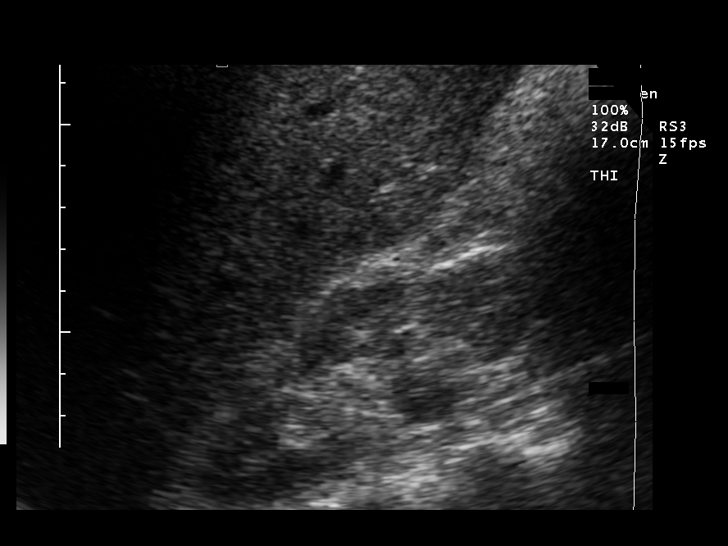
[im 43/57]
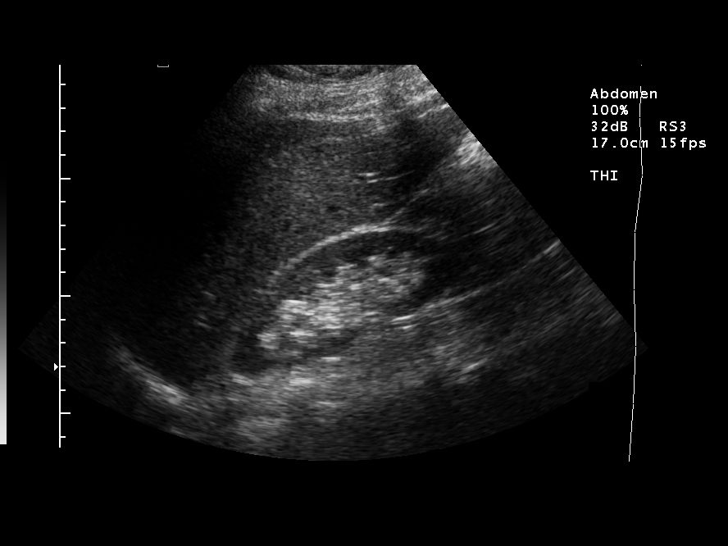
[im 47/57]
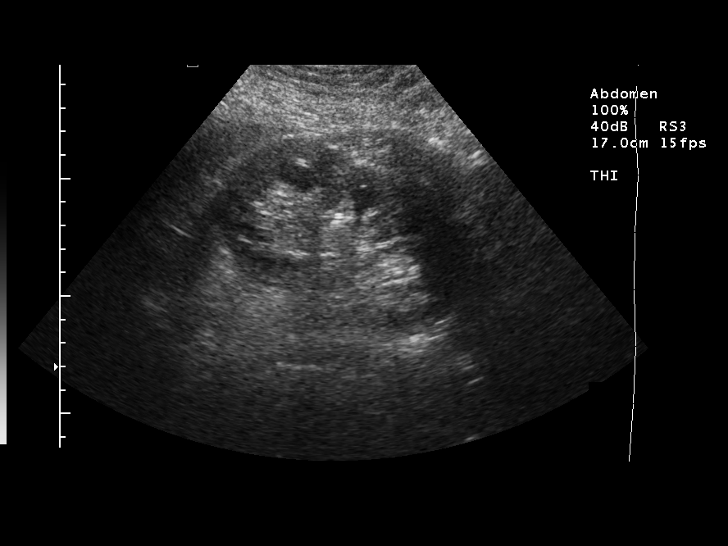
[im 52/57]
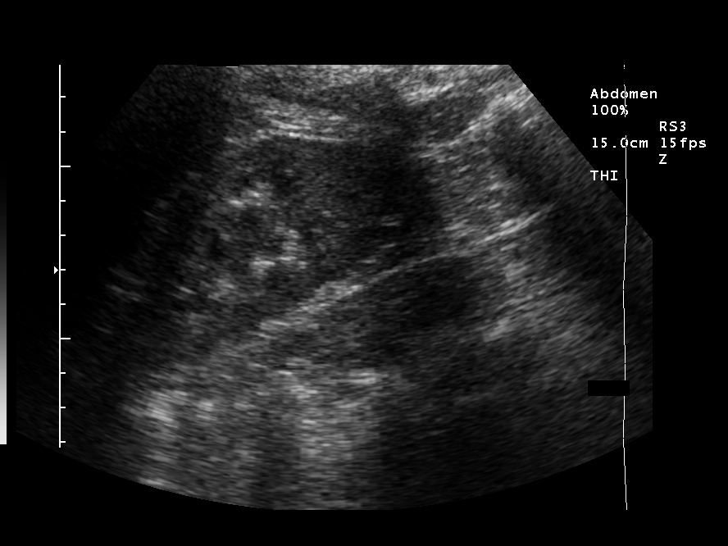
[im 57/57]
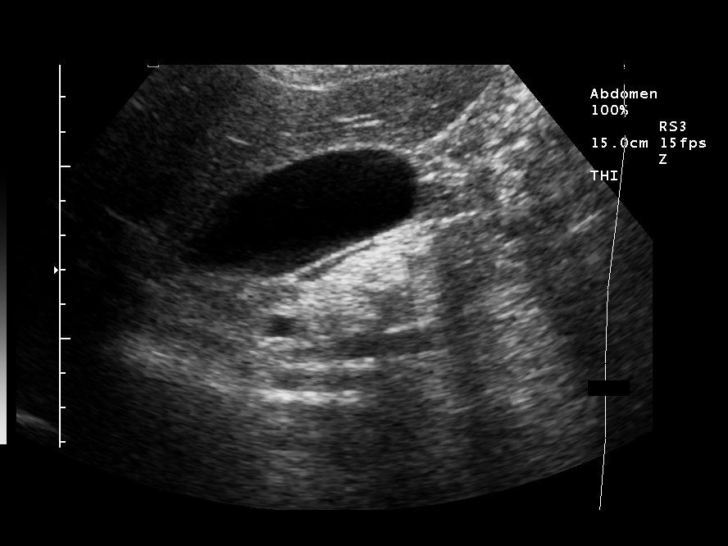

[14 of 25 positions shown; findings below may reference images not displayed]

FINDINGS: Gallbladder is normal without wall thickening, stone, or
pericholecystic fluid.  Sonographic Murphy's sign was not elicited.

No intrahepatic biliary ductal dilatation.  As described on CT, the
common duct is mildly prominent proximally.  This measures
maximally 8 mm on image 28.  Liver, IVC, pancreas all within normal
limits.

Spleen normal in size and echotexture.

Right kidney 11.8 cm and left kidney 11.9 cm.  No hydronephrosis.

Abdominal aorta nonaneurysmal without ascites.]
IMPRESSION: 1.  No evidence of cholelithiasis or cholecystitis.
2.  Mild/borderline common duct dilatation without intrahepatic
biliary ductal dilatation.  This may be within normal variation for
this patient.  If the bilirubin level is elevated, consider further
evaluation with non emergent MRCP.

## 2007-10-09 ENCOUNTER — Encounter: Admission: RE | Admit: 2007-10-09 | Discharge: 2007-10-09 | Payer: Self-pay | Admitting: Family Medicine

## 2007-10-09 IMAGING — US US ABDOMEN COMPLETE
1 series · 14 of 25 positions shown · non-contrast
Comparison: [REDACTED] abdominal CT [DATE]

CLINICAL DATA: Dilated distal common bile duct on [REDACTED] abdominal CT
[DATE] for follow-up.  Abdominal pain.  Nausea and vomiting.

ABDOMEN ULTRASOUND
TECHNIQUE: Complete abdominal ultrasound examination was performed
including evaluation of the liver, gallbladder, bile ducts,
pancreas, kidneys, spleen, IVC, and abdominal aorta.

[Series 1: us abdomen complete · 0.27mm/px · 14 of 62 slices shown]
[im 1/62]
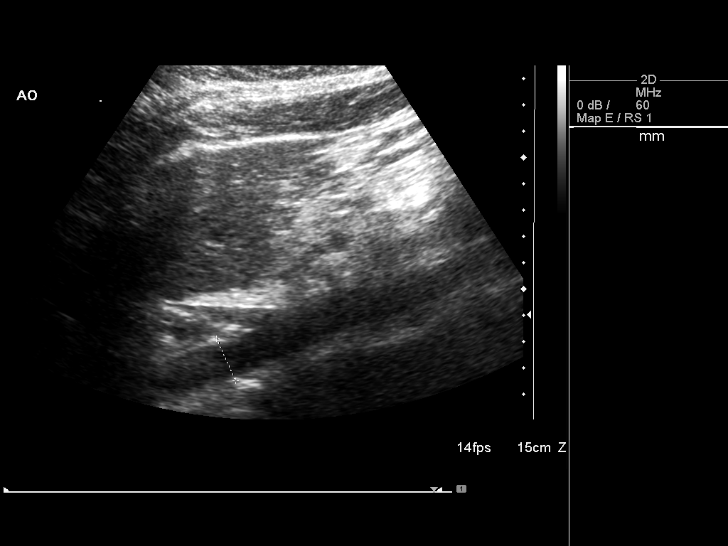
[im 6/62]
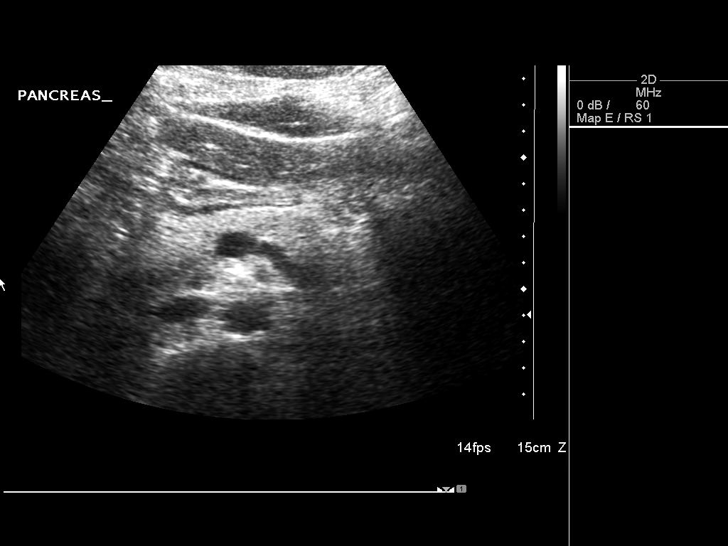
[im 11/62]
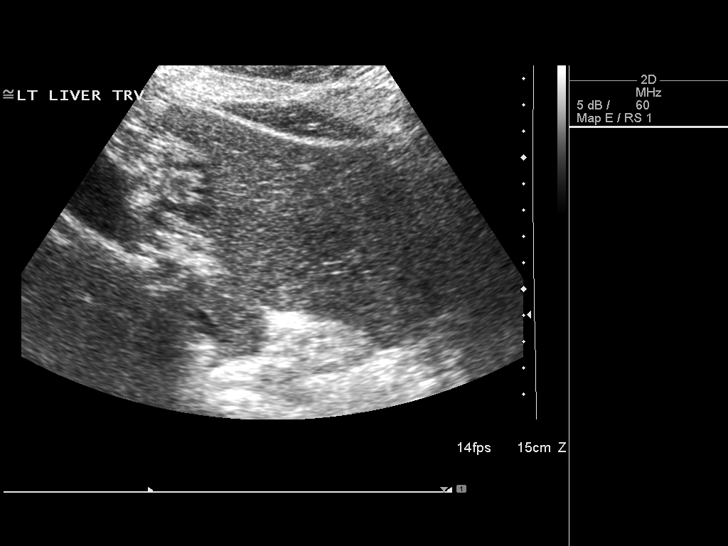
[im 16/62]
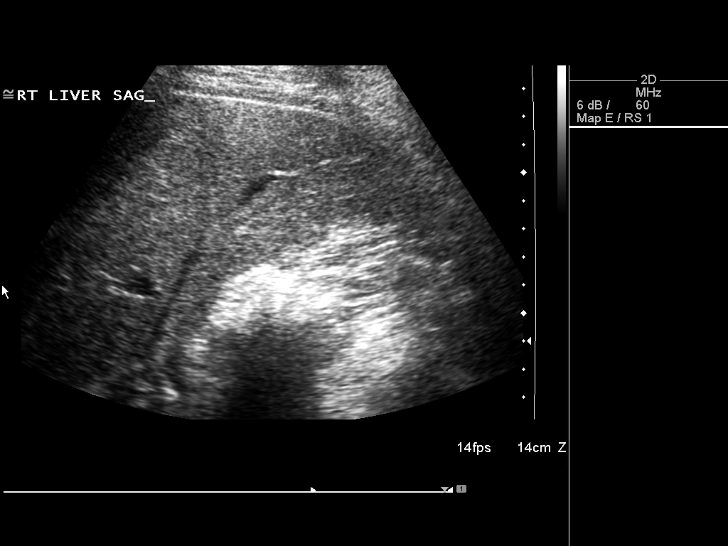
[im 21/62]
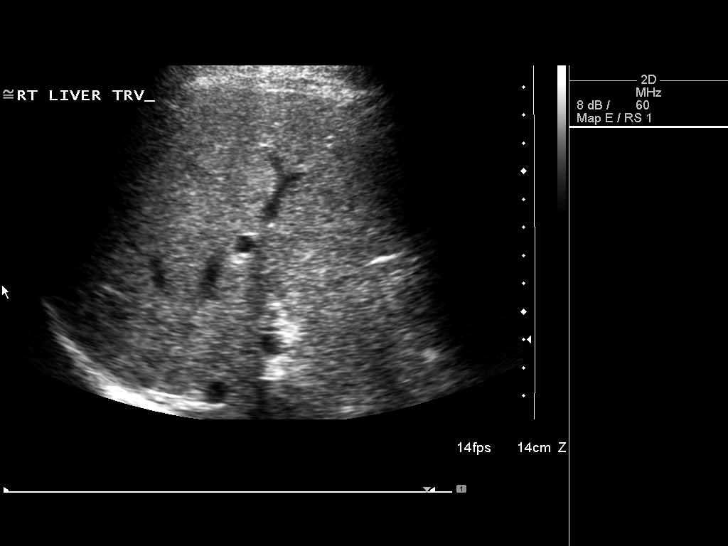
[im 23/62]
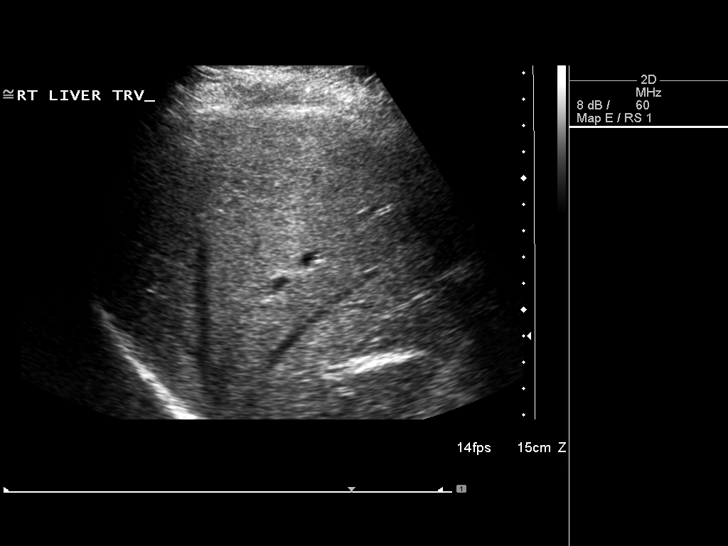
[im 28/62]
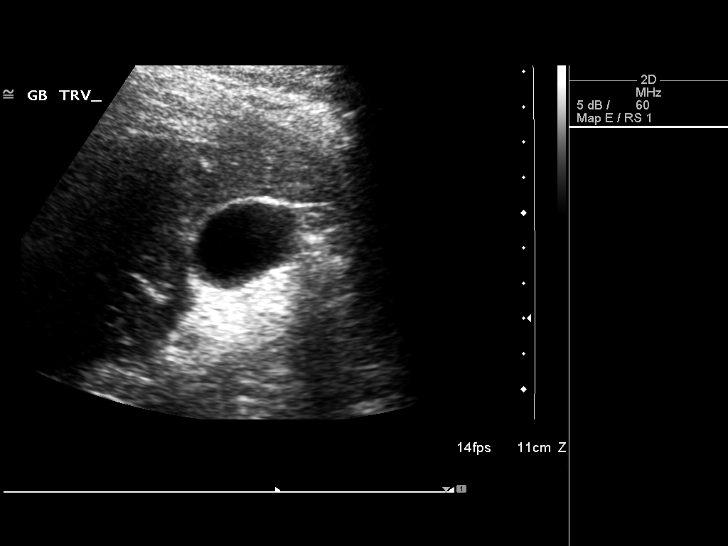
[im 34/62]
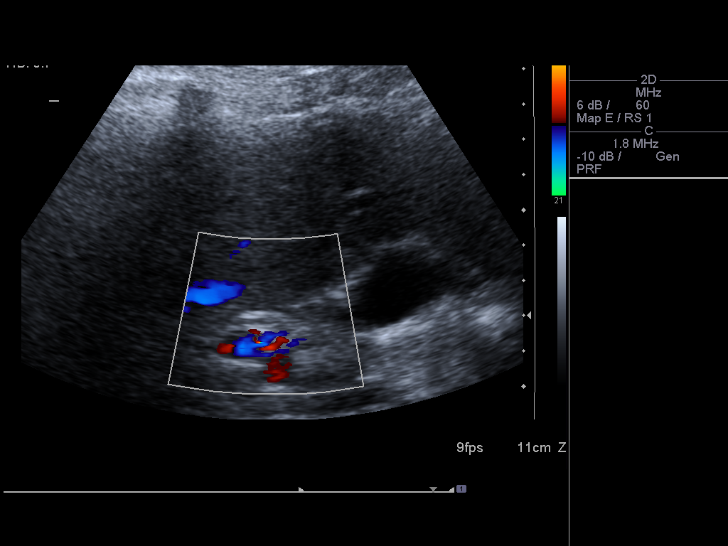
[im 39/62]
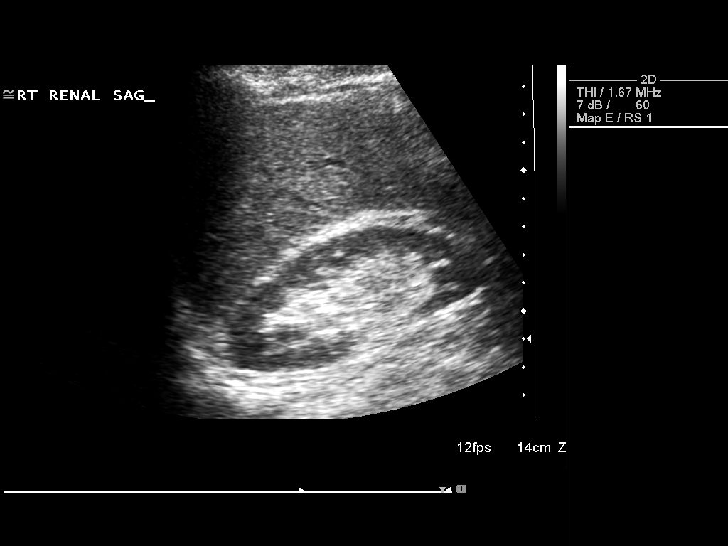
[im 41/62]
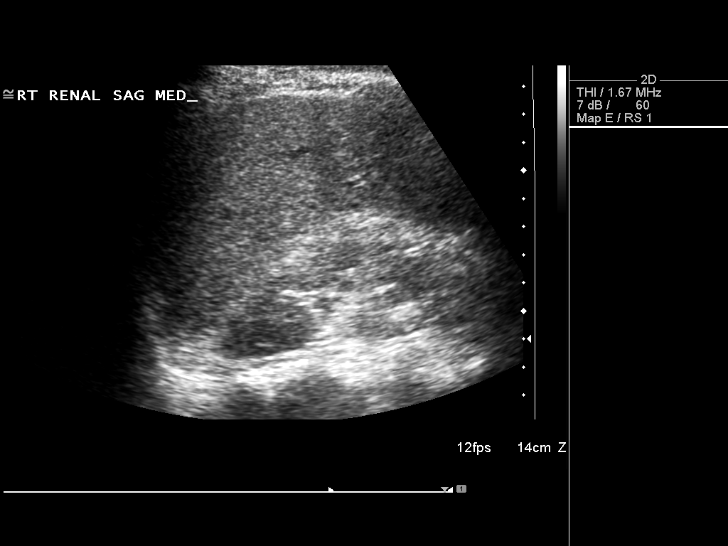
[im 46/62]
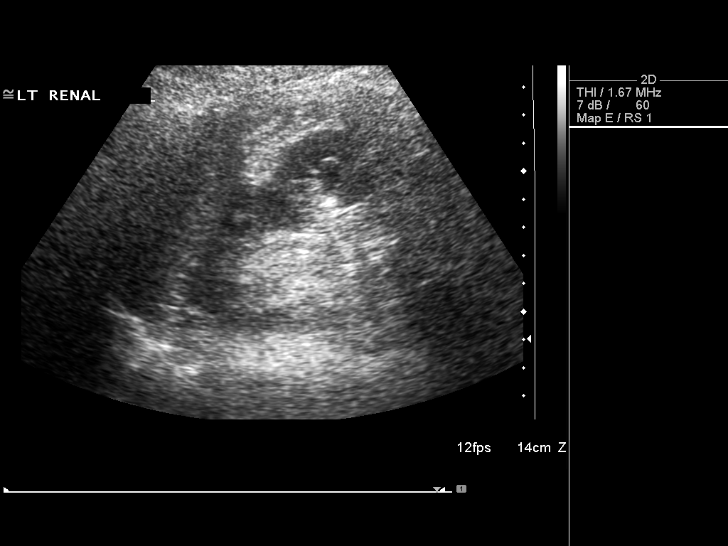
[im 51/62]
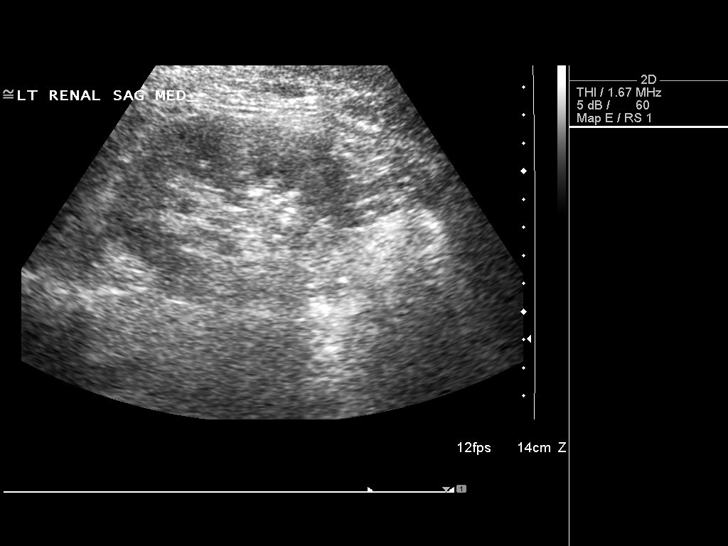
[im 56/62]
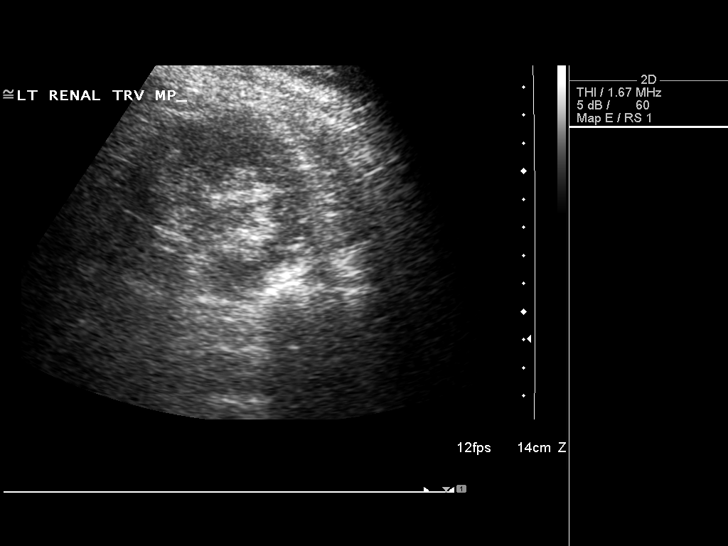
[im 62/62]
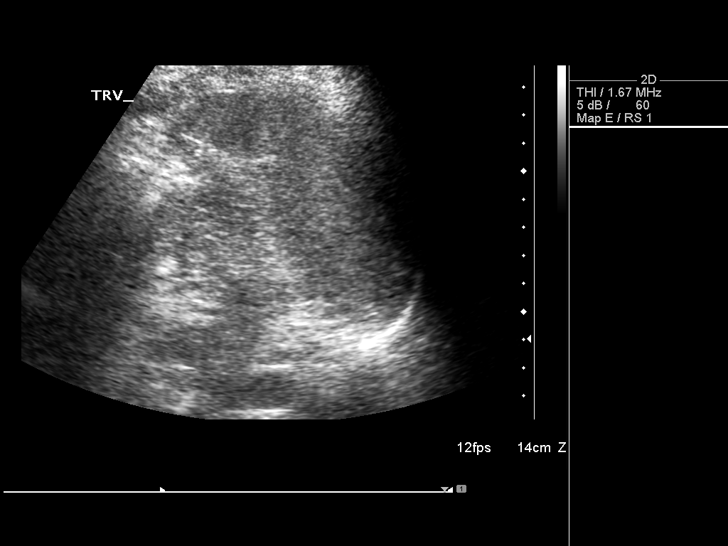

[14 of 25 positions shown; findings below may reference images not displayed]

FINDINGS: Gallbladder appears sonographically normal without sludge
or gallstones with normal 3 mm wall thickness.  No dilated
intrahepatic or extrahepatic bile ducts are seen with common bile
duct measuring upper limits of normal at 6 mm.  Visualized liver,
inferior vena cava, pancreas, spleen (11.1 cm long), right kidney
(11.1 cm long), left kidney (10.6 cm long), and abdominal aorta
(maximum diameter 1.7 cm) appear sonographically normal.
IMPRESSION: Normal - no distal common bile duct dilatation identified-- no
further imaging follow-up deemed necessary unless otherwise
clinically indicated.  (Tiny bilateral renal cysts on prior CT too
small to visualize on ultrasound modality).

## 2008-10-20 IMAGING — CR DG CHEST 2V
2 series · 2 of 2 positions shown · non-contrast
Comparison: None

CLINICAL DATA: Chest and abdominal pain

CHEST - 2 VIEW

[view not recorded (1 of 2)]
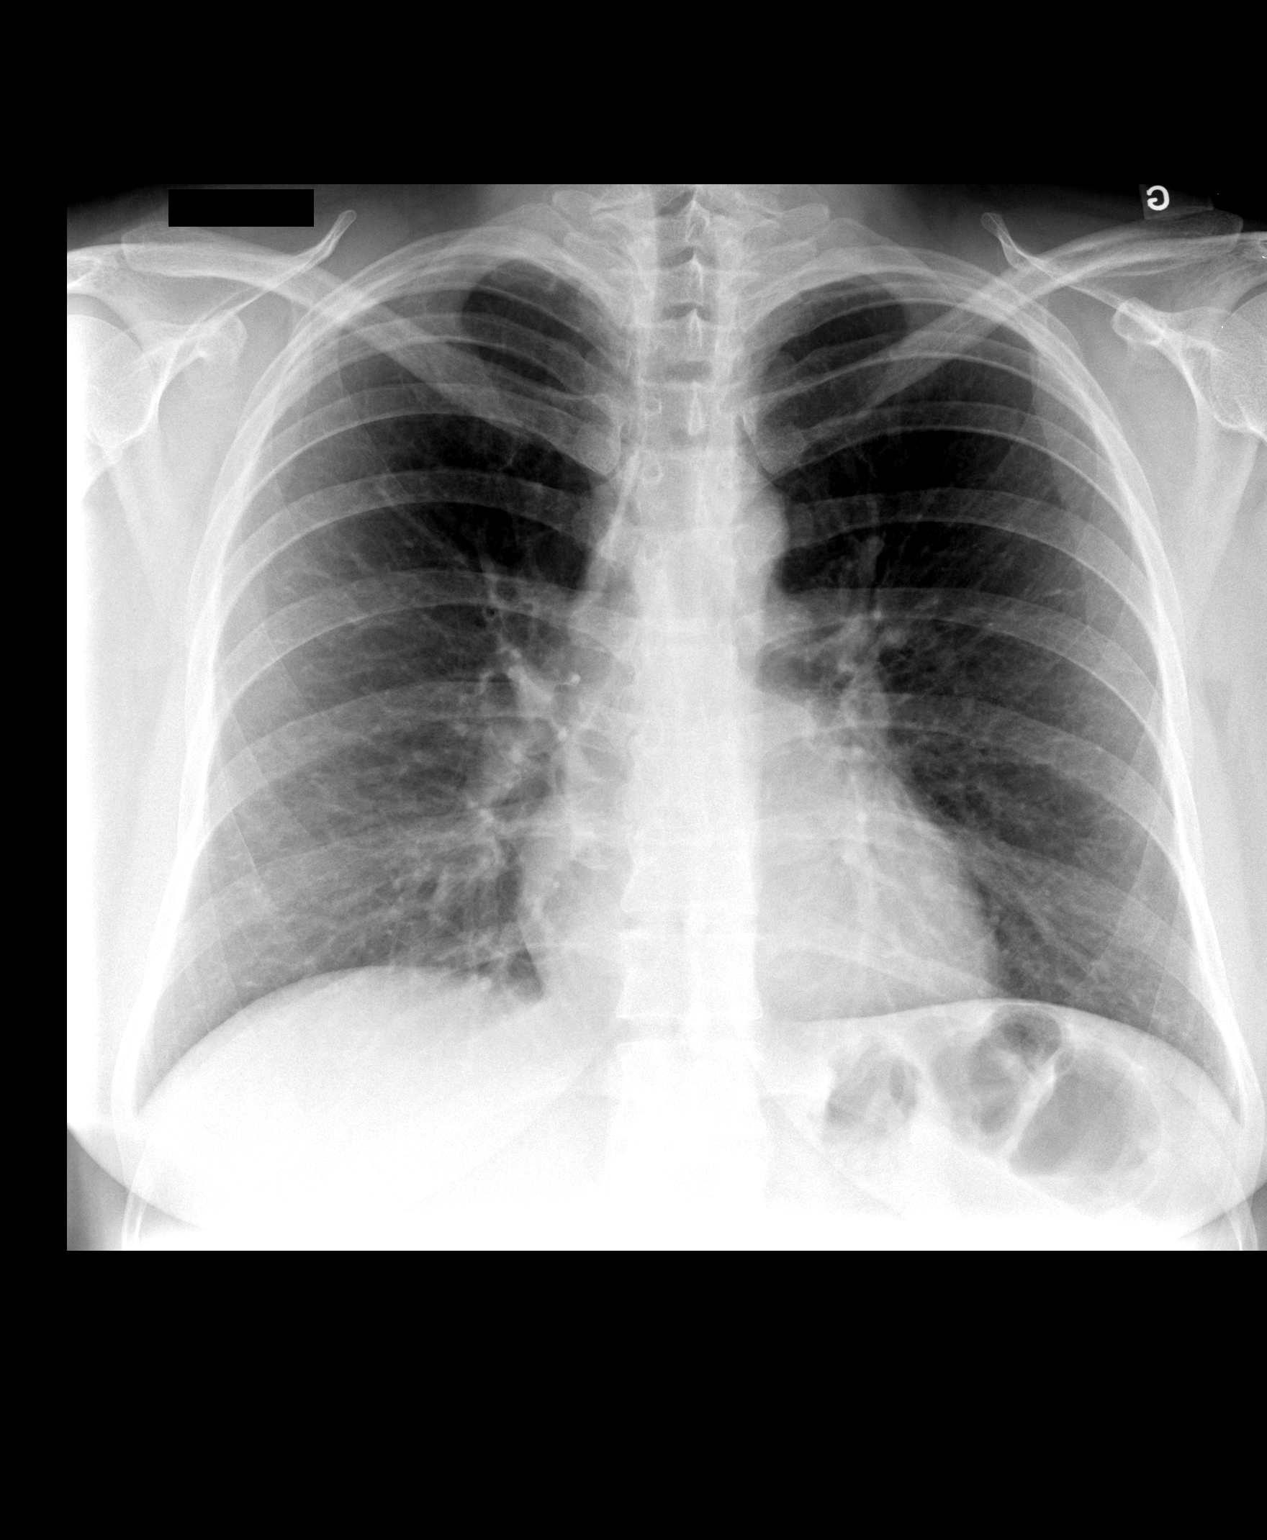

[view not recorded (2 of 2)]
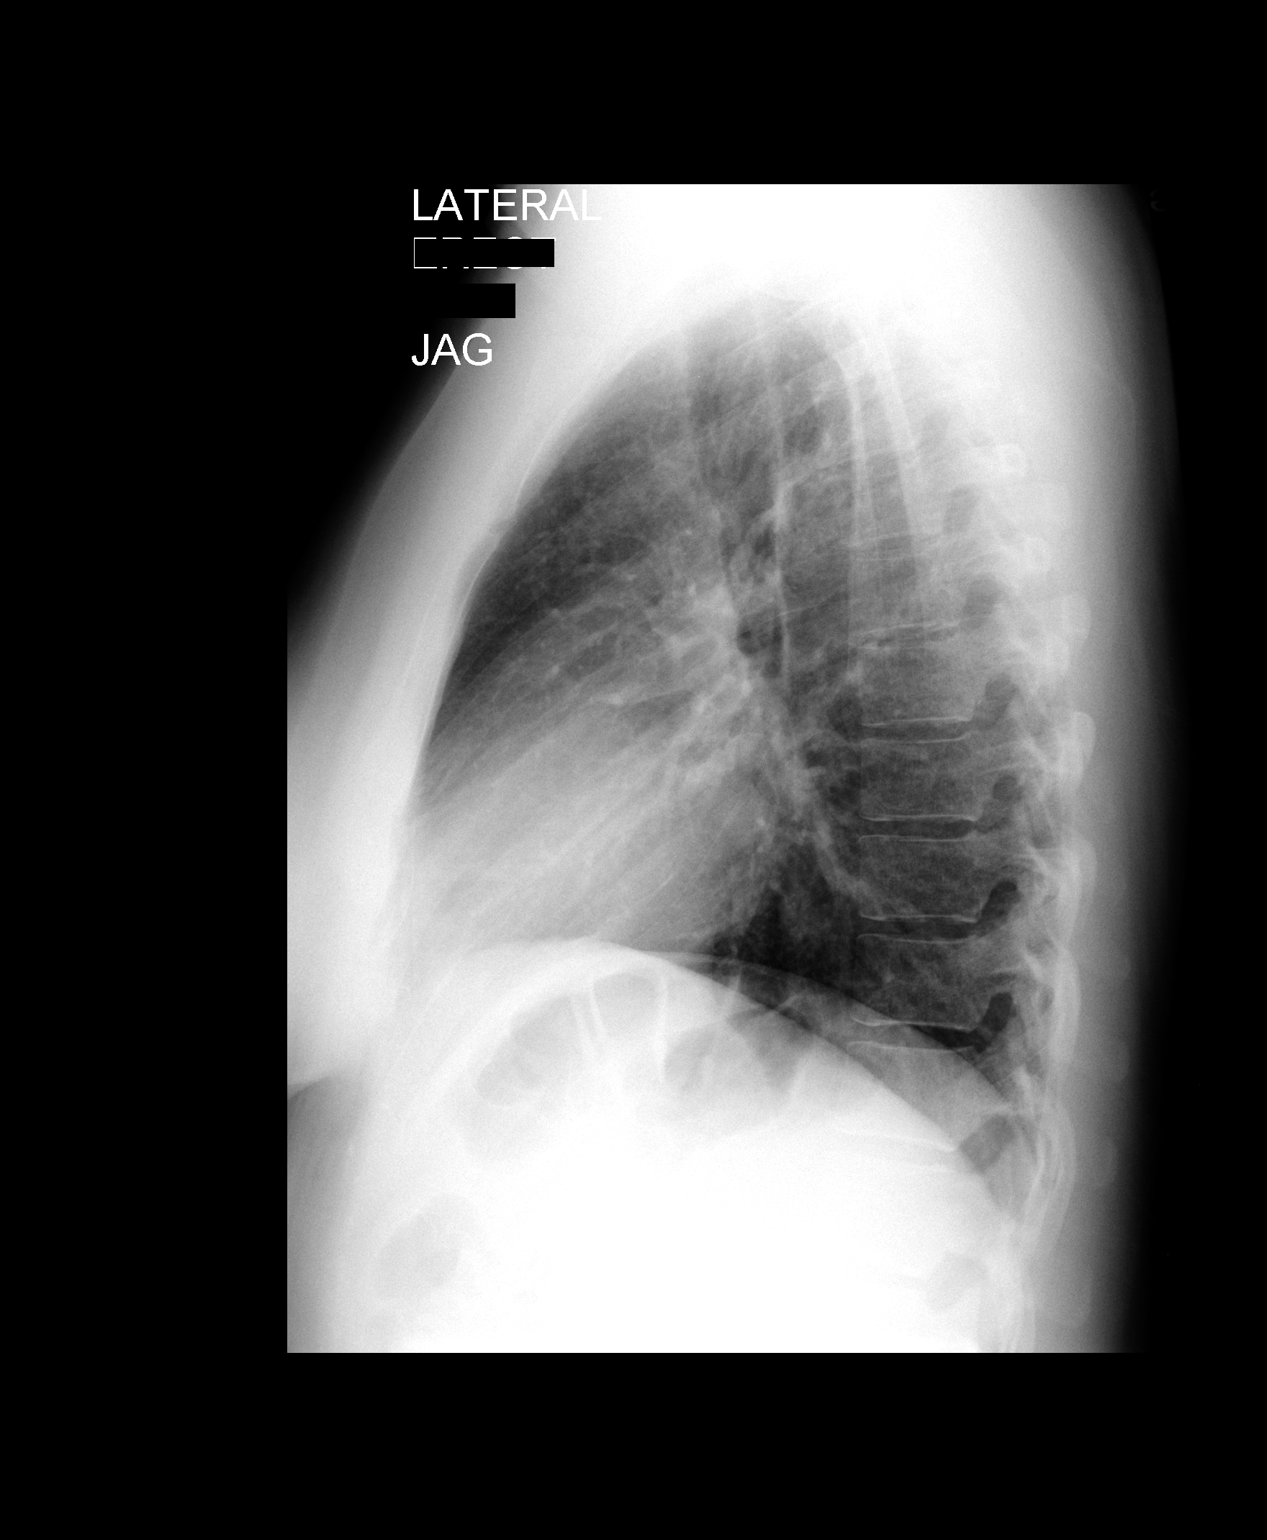

[2 of 2 positions shown; findings below may reference images not displayed]

FINDINGS: The cardiac silhouette, mediastinal and hilar contours
are within normal limits.  The lungs are clear.  The bony thorax is
intact.
IMPRESSION: 1.  No acute cardiopulmonary findings.

## 2009-01-02 ENCOUNTER — Other Ambulatory Visit: Admission: RE | Admit: 2009-01-02 | Discharge: 2009-01-02 | Payer: Self-pay | Admitting: Obstetrics and Gynecology

## 2009-09-12 ENCOUNTER — Emergency Department (HOSPITAL_COMMUNITY): Admission: EM | Admit: 2009-09-12 | Discharge: 2009-09-12 | Payer: Self-pay | Admitting: Emergency Medicine

## 2009-09-12 IMAGING — CT CT ABD-PELV W/ CM
2 of 4 series · 16 of 46 positions shown, 18 images · IV contrast (water & 100 ML OMNI 300)
Comparison: [DATE]

CLINICAL DATA: Lower abdominal pain, nausea, vomiting, elevated
white count, negative pregnancy test

CT ABDOMEN AND PELVIS WITH CONTRAST
TECHNIQUE: Multidetector CT imaging of the abdomen and pelvis was
performed following the standard protocol during bolus
administration of intravenous contrast.
Contrast: 100 ml [I6]

[Series 2: routine abdomen · axial · 0.81mm/px · z∈[-420,-35]mm · 13 of 85 slices shown, 15 images]
[im 4/85  soft-tissue]
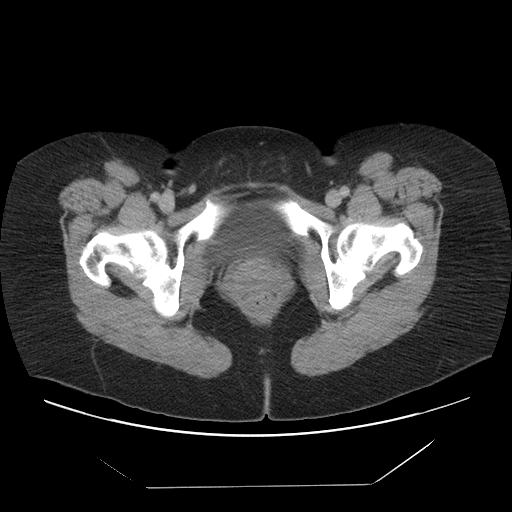
[im 4/85  bone]
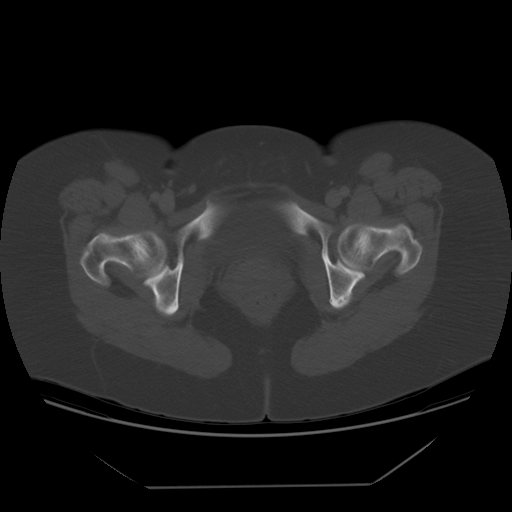
[im 11/85  soft-tissue]
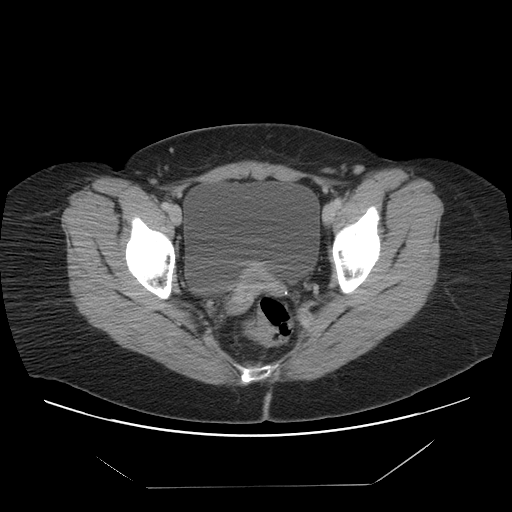
[im 19/85  soft-tissue]
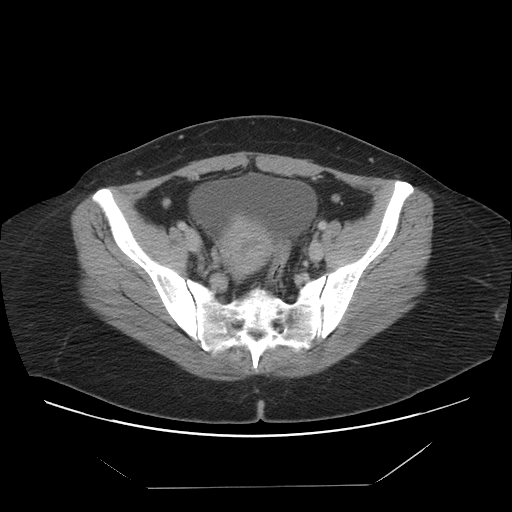
[im 22/85  soft-tissue]
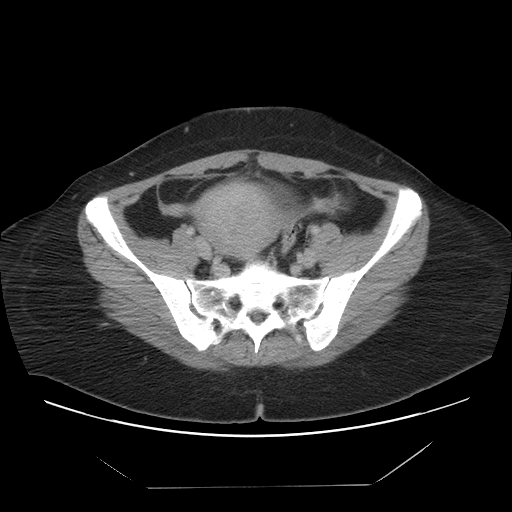
[im 30/85  soft-tissue]
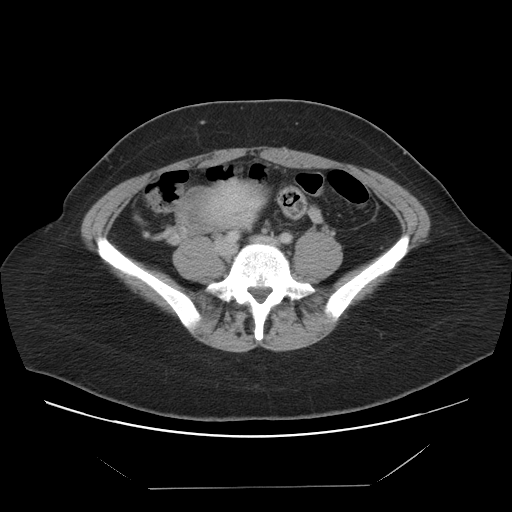
[im 37/85  soft-tissue]
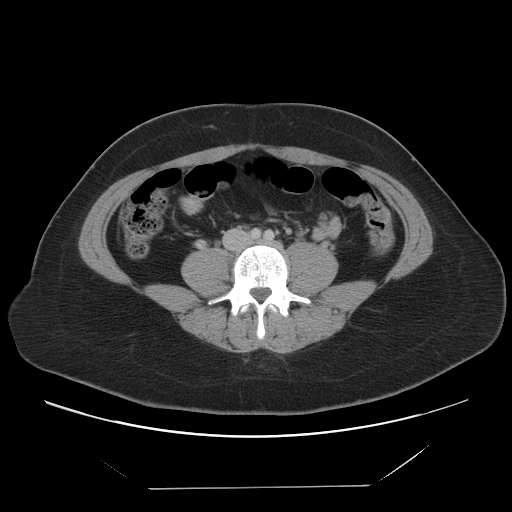
[im 44/85  soft-tissue]
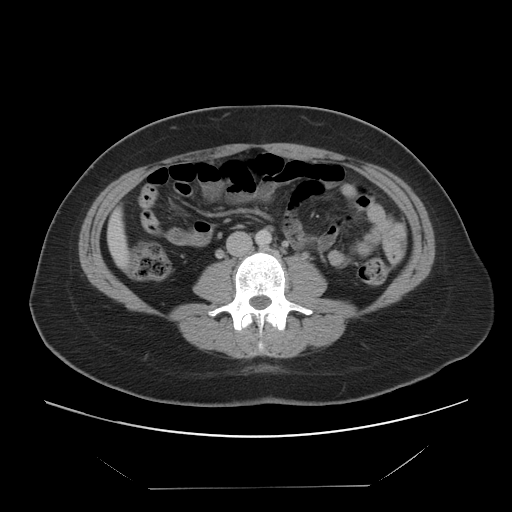
[im 48/85  soft-tissue]
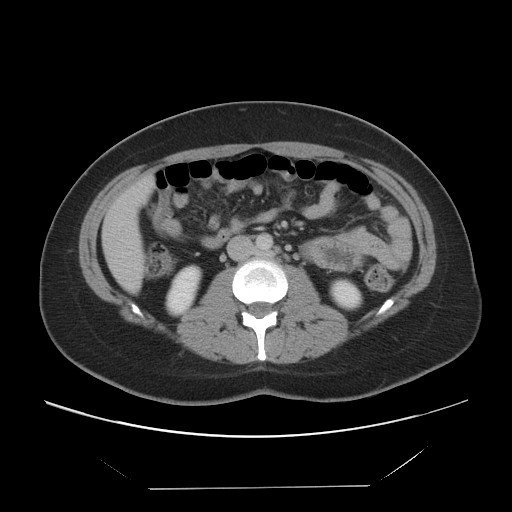
[im 55/85  soft-tissue]
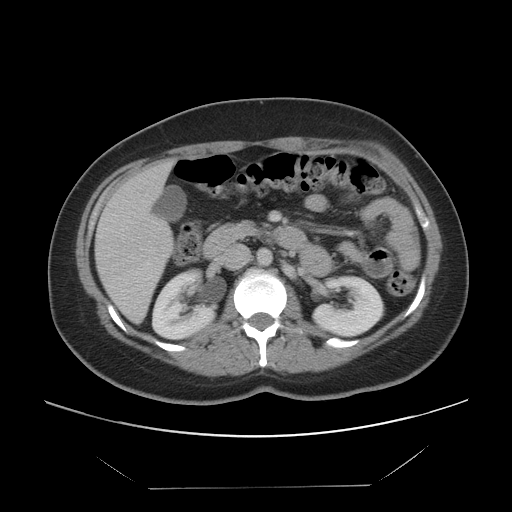
[im 55/85  bone]
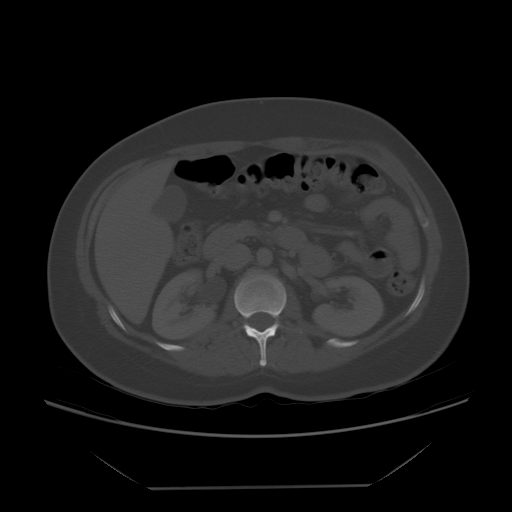
[im 63/85  soft-tissue]
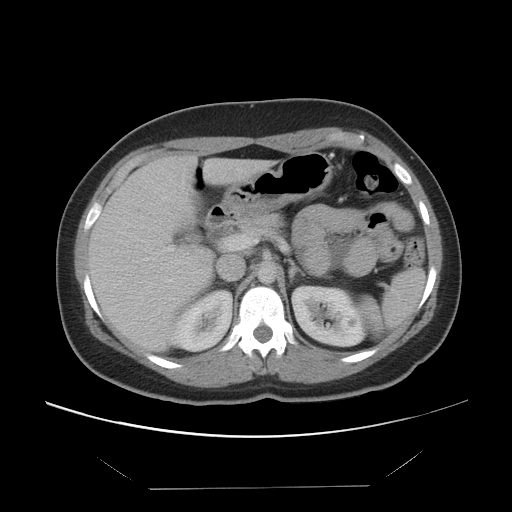
[im 66/85  soft-tissue]
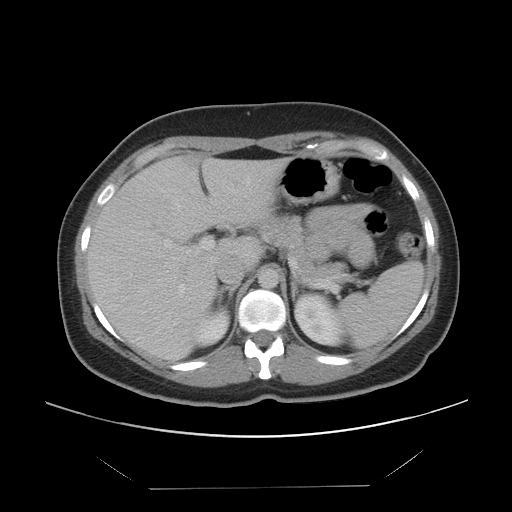
[im 74/85  soft-tissue]
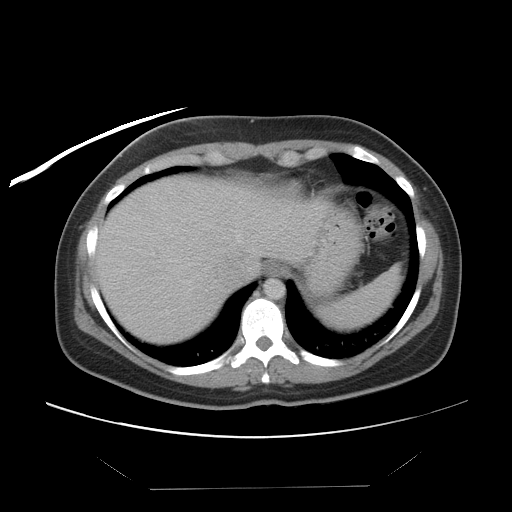
[im 81/85  soft-tissue]
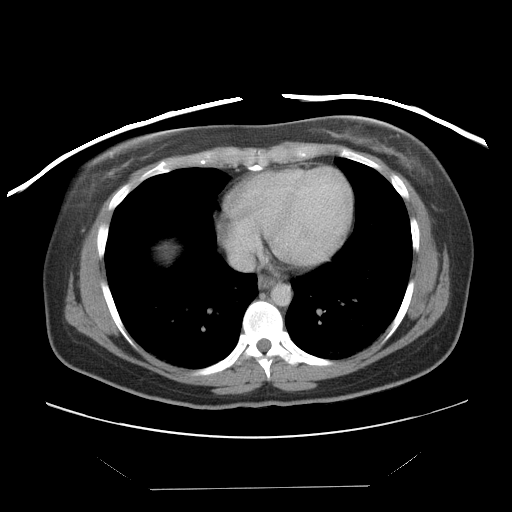

[Series 401: cor · coronal · 0.90mm/px · 3 of 86 slices shown]
[im 29/86  soft-tissue]
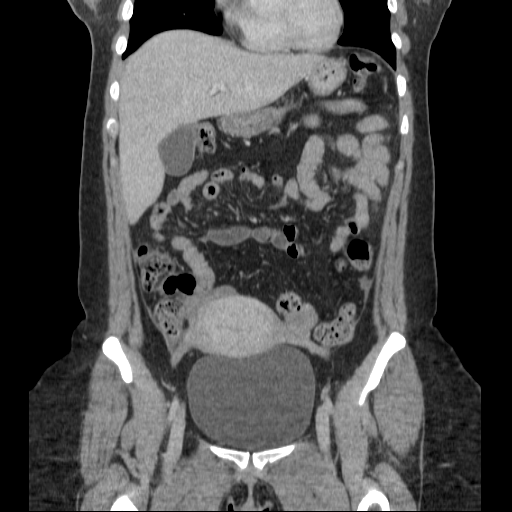
[im 38/86  soft-tissue]
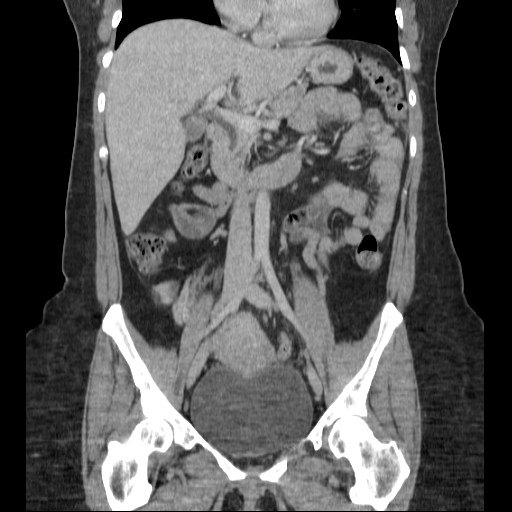
[im 48/86  soft-tissue]
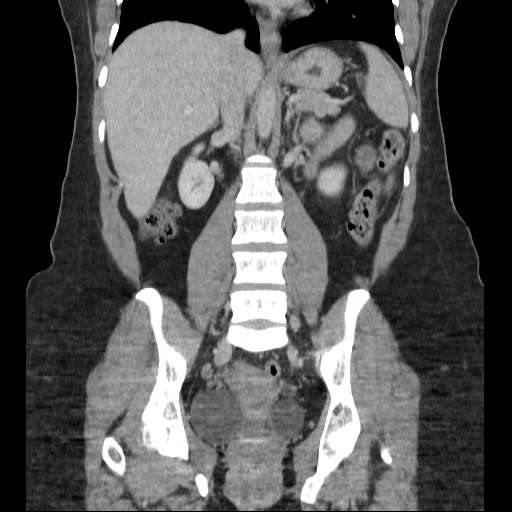

[16 of 46 positions shown; findings below may reference images not displayed]

FINDINGS: Minimal dependent basilar atelectasis.  Normal heart
size.  No pericardial or pleural effusion.

Abdomen:  Liver demonstrates focal fatty infiltration along the
falciform ligament, image 20.  No other hepatic abnormality.
Gallbladder, biliary system, pancreas, spleen, and adrenal glands
within normal limits.  Kidneys demonstrate small hypodense sub
centimeter lesions bilaterally suspicious for small renal cysts.
No acute obstruction or hydronephrosis.  In the left upper
quadrant, there is a small amount of free fluid along loops of
small bowel within the mesentery and along the spleen, nonspecific
in appearance.  No free air, bowel obstruction, dilatation, ileus.

Pelvis:  In the right lower quadrant, there is heterogeneous fluid
attenuation surrounding the right ovary suspicious for a component
of fluid and blood products, images 51 through 61.  Adjacent to
this area, the appendix appears normal.  Although no ovarian cystic
lesion is identified, this could be related to a ruptured
hemorrhagic cyst or simply physiologic free fluid in a menstruating
female.  Uterus and left ovary normal in size.  Urinary bladder
unremarkable.  Previous IUD has been removed.  No dependent pelvic
free fluid, fluid collection, hemorrhage or abscess.  No adenopathy
or inguinal hernia.

No acute osseous finding.
IMPRESSION: Heterogeneous fluid surrounding the right ovary with a small amount
of free fluid in the abdomen, these may be physiologic changes in a
menstruating female.  Other considerations would include a ruptured
ovarian cyst.

Normal appendix.

## 2010-04-23 ENCOUNTER — Other Ambulatory Visit: Payer: Self-pay | Admitting: Family Medicine

## 2010-04-23 ENCOUNTER — Other Ambulatory Visit: Payer: Self-pay

## 2010-04-30 ENCOUNTER — Ambulatory Visit
Admission: RE | Admit: 2010-04-30 | Discharge: 2010-04-30 | Disposition: A | Discharge status: Home / Self Care | Payer: No Typology Code available for payment source | Source: Ambulatory Visit

## 2010-04-30 IMAGING — US US ABDOMEN COMPLETE
1 series · 13 of 25 positions shown · non-contrast
Comparison: [HOSPITAL] abdominal pelvic CT [DATE] and
[HOSPITAL] at [REDACTED] ultrasound
[DATE].

CLINICAL DATA: Right upper and lower abdominal pain with nausea,
vomiting, diarrhea.

COMPLETE ABDOMINAL ULTRASOUND

[Series 1: us abdomen complete · 0.33mm/px · 13 of 67 slices shown]
[im 1/67]
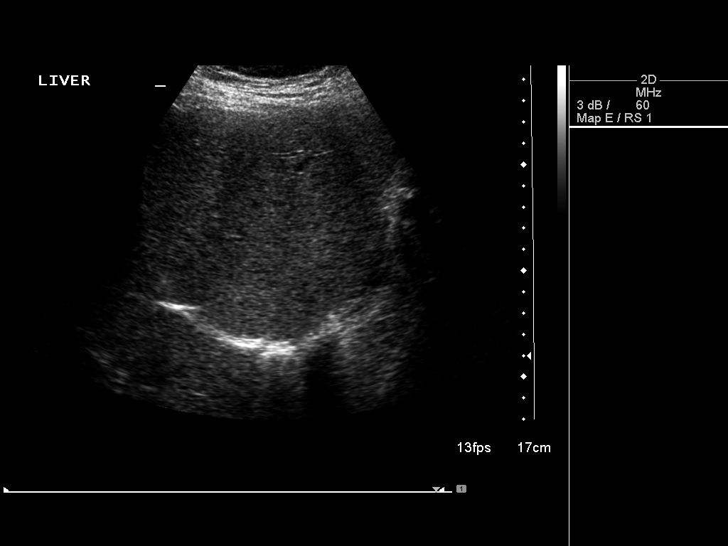
[im 6/67]
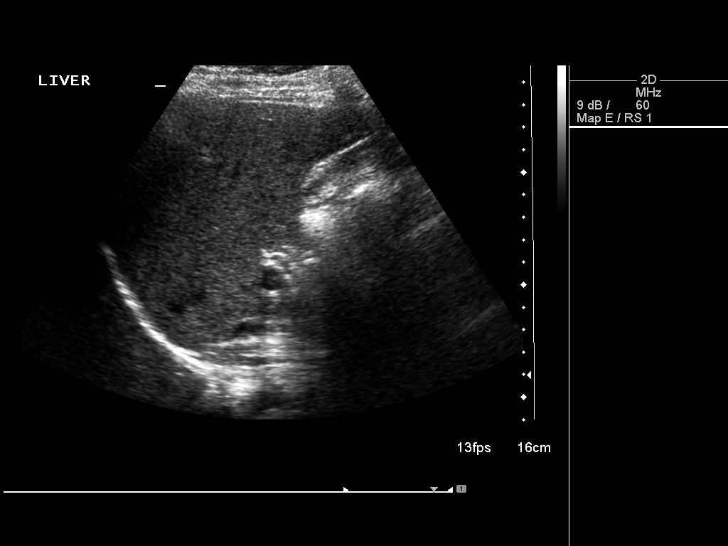
[im 12/67]
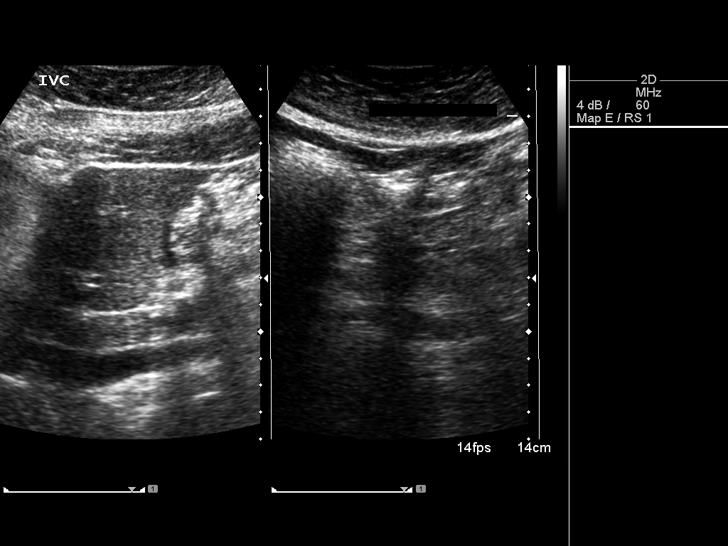
[im 17/67]
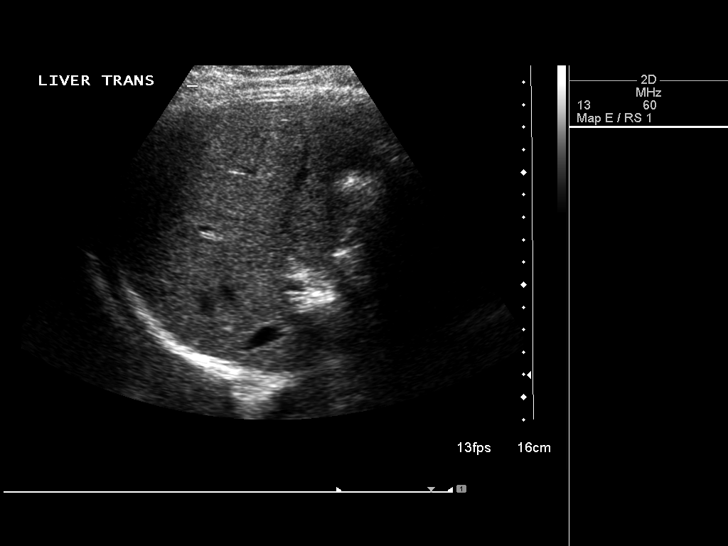
[im 23/67]
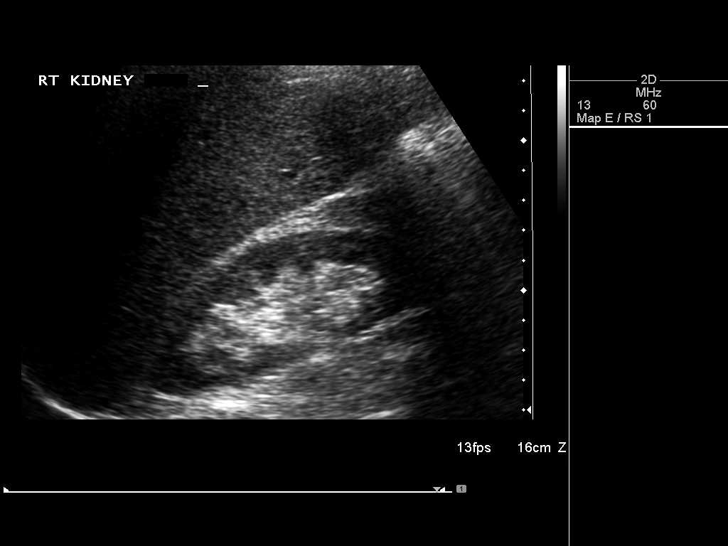
[im 28/67]
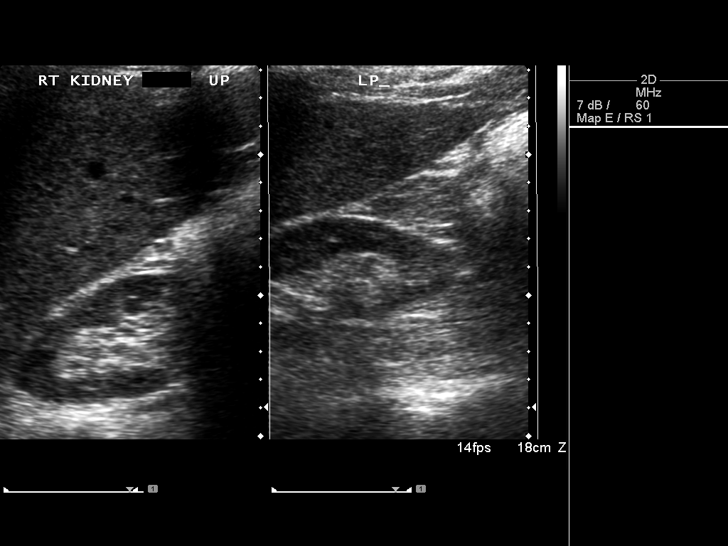
[im 34/67]
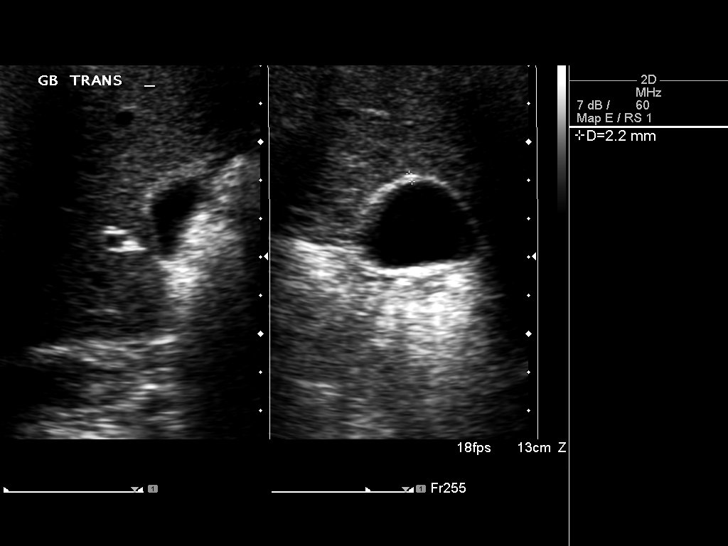
[im 39/67]
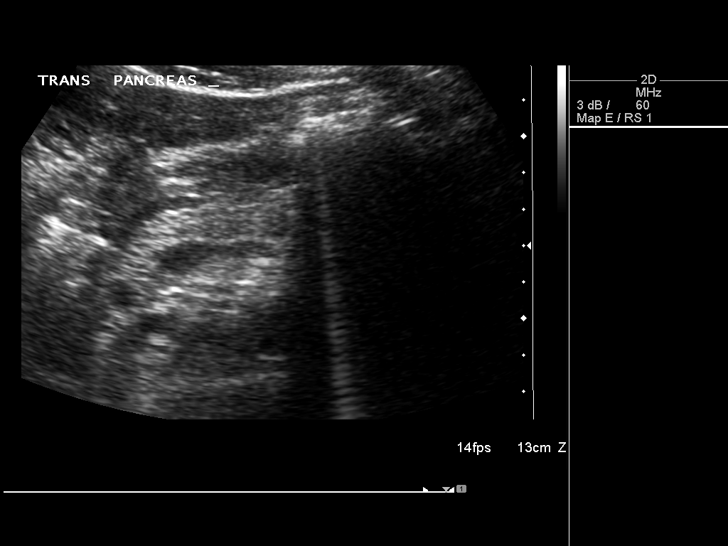
[im 45/67]
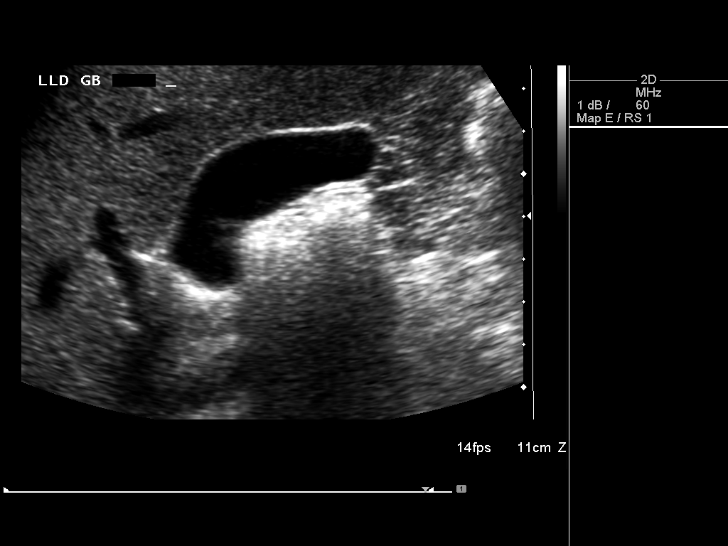
[im 50/67]
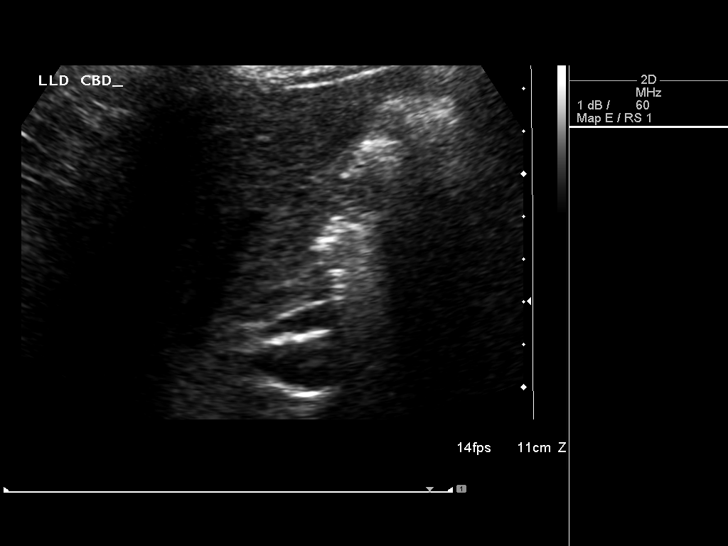
[im 56/67]
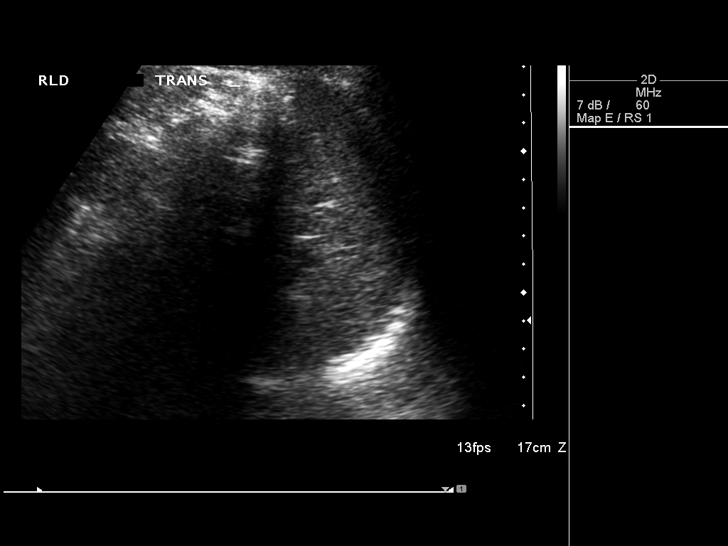
[im 61/67]
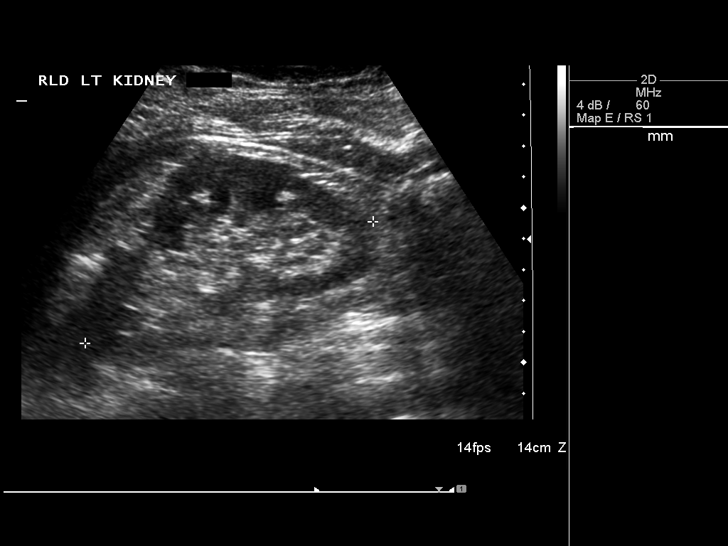
[im 67/67]
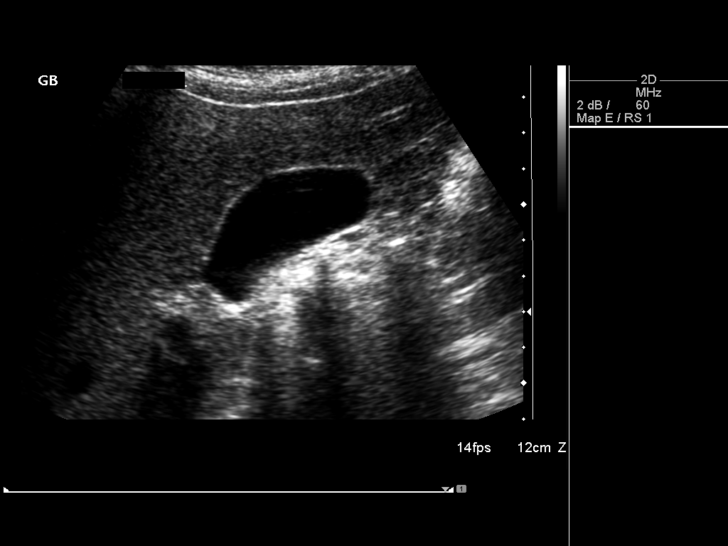

[13 of 25 positions shown; findings below may reference images not displayed]

FINDINGS: Gallbladder:  Sonographically normal without sludge or gallstones
with normal 2 mm wall thickness.

Common bile duct:  No dilated intrahepatic or extrahepatic bile
ducts with common bile duct measuring normally at 6 mm.

Liver:  Normal in size, echotexture with no focal lesions.

IVC:  Appears normal.

Pancreas:  Visualized pancreatic head and body appear normal with
tail obscured due to overlying gastrointestinal gas.

Spleen:  Sonographically normal measuring 3.2 cm long.

Right Kidney:  Sonographically normal measuring 10.8 cm long
(subcentimeter lower pole right renal cyst on CT too small to
visualize on current ultrasound).

Left Kidney:  Sonographically normal measuring 10.7 cm long with
subcentimeter upper pole CT manifest cyst not visualized on current
ultrasound.

Abdominal aorta:  No aneurysm identified with maximum proximal
diameter 1.7 cm.

No free fluid.
IMPRESSION: 1.  Tiny bilateral renal cysts on CT too small to resolve on
current ultrasound.
2.  Nonvisualization pancreatic tail.
3.  Otherwise, normal.

## 2010-05-05 ENCOUNTER — Other Ambulatory Visit (HOSPITAL_COMMUNITY): Payer: Self-pay | Admitting: Gastroenterology

## 2010-05-06 ENCOUNTER — Other Ambulatory Visit: Payer: Self-pay | Admitting: Gastroenterology

## 2010-05-13 ENCOUNTER — Ambulatory Visit (HOSPITAL_COMMUNITY)
Admission: RE | Admit: 2010-05-13 | Discharge: 2010-05-13 | Disposition: A | Discharge status: Home / Self Care | Payer: BC Managed Care – PPO | Source: Ambulatory Visit | Attending: Gastroenterology | Admitting: Gastroenterology

## 2010-05-13 DIAGNOSIS — R1084 Generalized abdominal pain: Secondary | ICD-10-CM | POA: Insufficient documentation

## 2010-05-13 DIAGNOSIS — R110 Nausea: Secondary | ICD-10-CM | POA: Insufficient documentation

## 2010-05-13 IMAGING — NM NM HEPATO W/GB/PHARM/[PERSON_NAME]
3 series · 13 of 13 positions shown · non-contrast
Comparison: Abdominal ultrasound [DATE] and abdominal CT
[DATE].

CLINICAL DATA: Abdominal pain.

NUCLEAR MEDICINE HEPATOBILIARY IMAGING WITH GALLBLADDER EF
TECHNIQUE: Sequential images of the abdomen were obtained [DATE]
minutes following intravenous administration of
radiopharmaceutical.  After the slow intravenous infusion of
uCg Cholecystokinin, the gallbladder ejection fraction was
determined.
Radiopharmaceutical:  5.4 mCi [NN] Choletec

[he hepatobiliary · 3.43mm/px · 6 of 46 frames shown (1 of 3)]
[frame 4/46]
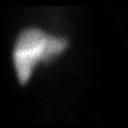
[frame 12/46]
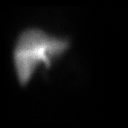
[frame 20/46]
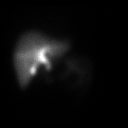
[frame 27/46]
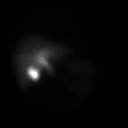
[frame 35/46]
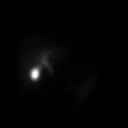
[frame 43/46]
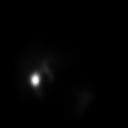

[he hepatobiliary · 1 of 1 slices shown (2 of 3)]
[im 1/1]
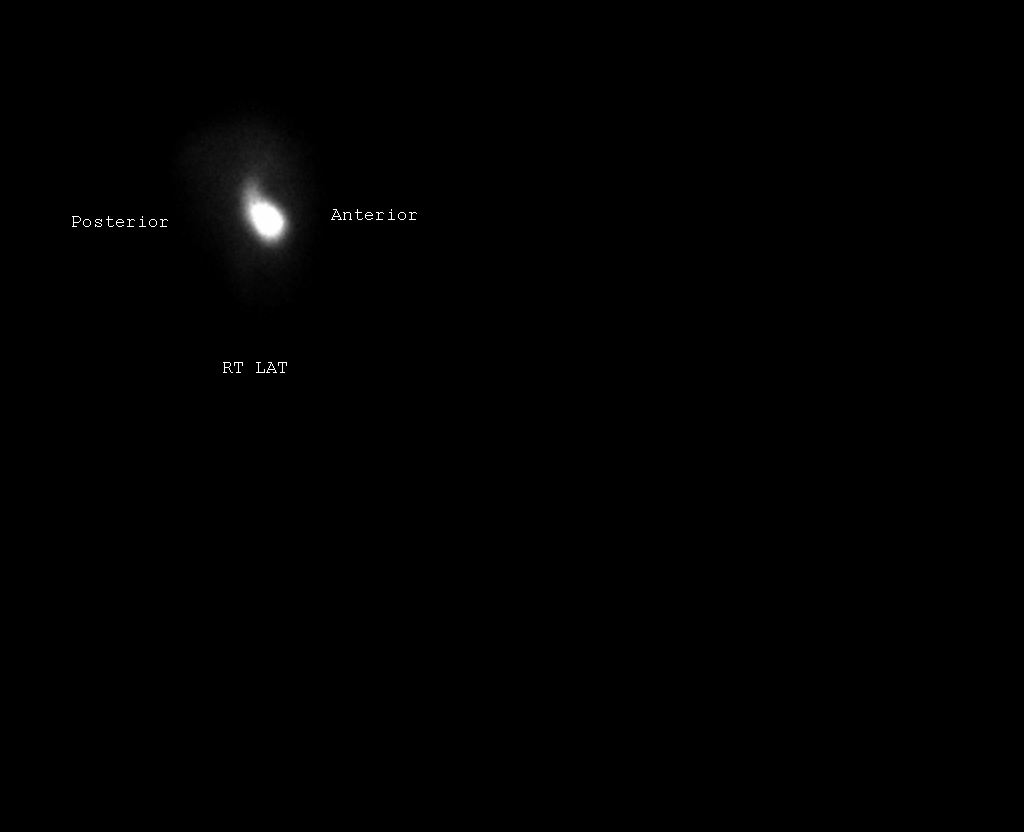

[he hepatobiliary · 3.43mm/px · 6 of 30 frames shown (3 of 3)]
[frame 3/30]
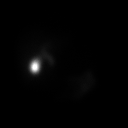
[frame 8/30]
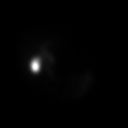
[frame 13/30]
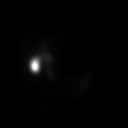
[frame 18/30]
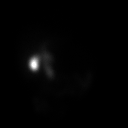
[frame 23/30]
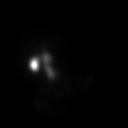
[frame 28/30]
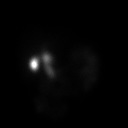

[13 of 13 positions shown; findings below may reference images not displayed]

FINDINGS: Initial images demonstrate homogenous hepatic activity
and prompt visualization of the gallbladder and biliary system.

The stimulated portion of the study demonstrates adequate
gallbladder contraction and progressive small bowel activity.  The
gallbladder ejection fraction calculated at approximately 30
minutes is 48.8%.  Normal ejection fractions are considered greater
than 30%.

The patient did experience symptoms (abdominal pain and nausea)
during CCK administration.
IMPRESSION: 1.  The cystic and common bile ducts are patent.
2.  Normal gallbladder ejection fraction of 49%.  The patient did
experience nausea and abdominal pain during CCK administration.

## 2010-05-13 MED ORDER — XENON XE 133 GAS
10.0000 | GAS_FOR_INHALATION | Freq: Once | RESPIRATORY_TRACT | Status: AC | PRN
  Administered 2010-05-13: 10 via RESPIRATORY_TRACT

## 2010-05-13 MED ORDER — TECHNETIUM TO 99M ALBUMIN AGGREGATED
6.0000 | Freq: Once | INTRAVENOUS | Status: AC | PRN
  Administered 2010-05-13: 6 via INTRAVENOUS

## 2010-05-13 MED ORDER — TECHNETIUM TC 99M MEBROFENIN IV KIT
5.0000 | PACK | Freq: Once | INTRAVENOUS | Status: AC | PRN
  Administered 2010-05-13: 5 via INTRAVENOUS

## 2010-06-06 LAB — BASIC METABOLIC PANEL WITH GFR
BUN: 9 mg/dL (ref 6–23)
CO2: 23 meq/L (ref 19–32)
Calcium: 9 mg/dL (ref 8.4–10.5)
Chloride: 106 meq/L (ref 96–112)
Creatinine, Ser: 0.74 mg/dL (ref 0.4–1.2)
GFR calc non Af Amer: >60 mL/min
Glucose, Bld: 89 mg/dL (ref 70–99)
Potassium: 3.8 meq/L (ref 3.5–5.1)
Sodium: 139 meq/L (ref 135–145)

## 2010-06-06 LAB — DIFFERENTIAL
Basophils Absolute: 0.1 10*3/uL (ref 0.0–0.1)
Basophils Relative: 0 % (ref 0–1)
Eosinophils Absolute: 0.1 10*3/uL (ref 0.0–0.7)
Eosinophils Relative: 1 % (ref 0–5)
Lymphocytes Relative: 16 % (ref 12–46)
Lymphs Abs: 2.2 10*3/uL (ref 0.7–4.0)
Monocytes Absolute: 0.6 10*3/uL (ref 0.1–1.0)
Monocytes Relative: 5 % (ref 3–12)
Neutro Abs: 10.5 10*3/uL — ABNORMAL HIGH (ref 1.7–7.7)
Neutrophils Relative %: 78 % — ABNORMAL HIGH (ref 43–77)

## 2010-06-06 LAB — CBC
HCT: 39.7 % (ref 36.0–46.0)
Hemoglobin: 14 g/dL (ref 12.0–15.0)
MCH: 33.2 pg (ref 26.0–34.0)
MCHC: 35.3 g/dL (ref 30.0–36.0)
MCV: 94.3 fL (ref 78.0–100.0)
Platelets: 138 10*3/uL — ABNORMAL LOW (ref 150–400)
RBC: 4.21 MIL/uL (ref 3.87–5.11)
RDW: 11.9 % (ref 11.5–15.5)
WBC: 13.5 10*3/uL — ABNORMAL HIGH (ref 4.0–10.5)

## 2010-06-06 LAB — HEPATIC FUNCTION PANEL
ALT: 21 U/L (ref 0–35)
AST: 22 U/L (ref 0–37)
Albumin: 3.8 g/dL (ref 3.5–5.2)
Alkaline Phosphatase: 62 U/L (ref 39–117)
Bilirubin, Direct: <0.1 mg/dL (ref 0.0–0.3)
Total Bilirubin: 0.7 mg/dL (ref 0.3–1.2)
Total Protein: 6.5 g/dL (ref 6.0–8.3)

## 2010-06-06 LAB — URINALYSIS, ROUTINE W REFLEX MICROSCOPIC
Glucose, UA: NEGATIVE mg/dL
Hgb urine dipstick: NEGATIVE
Ketones, ur: 15 mg/dL — AB
Nitrite: NEGATIVE
Protein, ur: NEGATIVE mg/dL
Specific Gravity, Urine: 1.029 (ref 1.005–1.030)
Urobilinogen, UA: 0.2 mg/dL (ref 0.0–1.0)
pH: 6 (ref 5.0–8.0)

## 2010-06-06 LAB — WET PREP, GENITAL
Trich, Wet Prep: NONE SEEN
WBC, Wet Prep HPF POC: NONE SEEN
Yeast Wet Prep HPF POC: NONE SEEN

## 2010-06-06 LAB — GC/CHLAMYDIA PROBE AMP, GENITAL
Chlamydia, DNA Probe: NEGATIVE
GC Probe Amp, Genital: NEGATIVE

## 2010-06-06 LAB — POCT PREGNANCY, URINE: Preg Test, Ur: NEGATIVE

## 2010-08-06 NOTE — Discharge Summary (Signed)
Lynn Simpson, Lynn Simpson                        ACCOUNT NO.:  [id number]   MEDICAL RECORD NO.:  [id number]                   PATIENT TYPE:  INP   LOCATION:  9126                                 FACILITY:  [facility]   PHYSICIAN:  Charles A. Sydnee Cabal, MD            DATE OF BIRTH:  September 25, 1970   DATE OF ADMISSION:  10/08/2003  DATE OF DISCHARGE:  10/12/2003                                 DISCHARGE SUMMARY   PRIMARY DISCHARGE DIAGNOSES:  1. Intrauterine pregnancy 40 weeks 3 days.  2. Oligohydramnios.   ADDITIONAL DIAGNOSIS:  Fetal intolerance of labor.   PROCEDURE:  Primary low transverse cesarean section.   DISPOSITION:  The patient discharged home to follow up in the office in 24  hours to have staples discontinued.  She was given precautions to notify for  temperature greater than 100 degrees.  Increased pain, bleeding, or drainage  from the incision to notify physician.  Convalesce at home.  No driving for  2 weeks.  No lifting greater than 25 pounds for 4 weeks.  She was given a  prescription for Percocet 1-2 p.o. q.4h. p.r.n. #40 and instructed to take  iron one tablet a day in addition to prenatal vitamins once a day.   LABORATORY DATA:  Postoperative hemoglobin 9.5, hematocrit 28.8.   HOSPITAL COURSE:  The patient was admitted, underwent induction secondary to  oligohydramnios.  She had Cytotec induction.  Was noted to have some  intolerance of induction at times but did dilate up to 6 cm.  At that time  had developed regular late decelerations and was taken for cesarean section.  She had a vigorous female, Apgars 5 and 9.  The baby's weight was 6 pounds 13  ounces.  Arterial cord pH returned 7.15.  Postoperative day #1 the patient  did well, Foley catheter was discontinued.  She voided without difficulty.  She was given general diet after flatus returned.  Pain was controlled with  p.o. pain medication.  She ambulated without difficulty postoperative day  #2, continued to do well.   P.o. pain medication was working well.  She  continued with routine postoperative care and was doing well, and was  discharged home on postoperative day #3.                                               Charles A. Sydnee Cabal, MD    CAD/MEDQ  D:  11/05/2003  T:  11/05/2003  Job:  [id number]

## 2010-08-06 NOTE — H&P (Signed)
Lynn Simpson, Lynn Simpson                        ACCOUNT NO.:  [id number]   MEDICAL RECORD NO.:  [id number]                     PATIENT TYPE:  MAT   LOCATION:  [facility]                                 FACILITY:  [facility]   PHYSICIAN:  Charles A. Sydnee Cabal, MD            DATE OF BIRTH:  [birth year]   DATE OF ADMISSION:  10/13/2003  DATE OF DISCHARGE:                                HISTORY & PHYSICAL   REASON FOR ADMISSION:  This patient to be admitted on October 13, 2003 to  undergo induction secondary to post dates - 41 weeks 1 day.  She is a 40-  year-old para 2-0-2-2 with [facility] October 05, 2003 based on LMP December 29, 2002  confirmed with 9-week 4-day ultrasound.  She notes active fetal movement at  this time.  She will have a growth ultrasound, AFI, and biophysical profile  at 40 weeks 4 days secondary to history of deep variables with term delivery  last pregnancy with suspected oligohydramnios.  Oligohydramnios was not  documented but she did develop deep variables during labor and for this  reason I do want to check the amniotic fluid as she is in her 40th week.  She currently denies rupture of membranes or bleeding or significant  contractions.   OBSTETRICAL LABORATORY DATA:  Hemoglobin on May 10 11.8.  Glucose challenge  126 on Aug 08, 2003.  Group B strep negative.  Quad screen negative.  TSH  normal.  Cystic fibrosis negative.  Hepatitis C negative.  GC and chlamydia  negative.  Pap:  Low-grade, high-risk HPV positive.  HIV negative.  Hepatitis B negative.  Rubella not immune.  VDRL negative.  Antibody screen  negative.  Blood type O positive.  Platelets initially 166, hemoglobin 12.6.   PAST MEDICAL HISTORY:  History of bladder infections.   SURGICAL HISTORY:  SVD x2, induced abortion x1, miscarriage x1.   MEDICATIONS:  [place] D.   ALLERGIES:  No known drug allergies.   SOCIAL HISTORY:  Smoking.  No alcohol or drug use or STD otherwise except  HPV exposure; this was not known to her.   She is married in a monogamous  relationship with her husband.   FAMILY HISTORY:  Her mom with some type of intestinal cancer; otherwise,  noncontributory.   REVIEW OF SYSTEMS:  As noted in HPI.   PHYSICAL EXAMINATION:  GENERAL:  Alert and oriented x3, no distress.  VITAL SIGNS:  Blood pressure today 120/78, respirations 20, pulse 90, weight  179.  HEENT:  Grossly within normal limits.  NECK:  Supple without thyromegaly or adenopathy.  LUNGS:  Clear bilaterally.  HEART:  Regular rate and rhythm, 2/6 systolic ejection murmur left sternal  border.  BREASTS:  No masses, tenderness, discharge, skin or nipple changes  bilaterally.  ABDOMEN:  Gravid, fundal height 38 cm, somewhat oblique lie with vertex in  the pelvis on Leopold's as well as cervical exam with  the baby's lie  somewhat over to the right obliquely.  PELVIC:  Cervix 1 cm dilated, 25% effaced, vertex intact, -2 station.  EXTREMITIES:  Mild edema, lower extremities bilaterally.  Deep tendon  reflexes 1-2+, symmetrical.   ASSESSMENT:  Intrauterine pregnancy at 40 weeks 1 day today, planned to be  41 weeks 1 day upon admission for induction secondary to post dates.   PLAN:  Admission.  Pitocin induction, high-dose Pitocin protocol planned at  0500 on October 13, 2003.  All questions are answered.  The patient will  proceed as outlined.                                               Charles A. Sydnee Cabal, MD    CAD/MEDQ  D:  10/06/2003  T:  10/06/2003  Job:  [id number]

## 2010-08-06 NOTE — Op Note (Signed)
Lynn Simpson, Lynn Simpson                        ACCOUNT NO.:  [id number]   MEDICAL RECORD NO.:  [id number]                   PATIENT TYPE:  INP   LOCATION:  9168                                 FACILITY:  [facility]   PHYSICIAN:  James A. Ashley Royalty, M.D.             DATE OF BIRTH:  October 02, 1970   DATE OF PROCEDURE:  DATE OF DISCHARGE:                                 OPERATIVE REPORT   PREOPERATIVE DIAGNOSES:  1. Intrauterine pregnancy at 40 weeks 3 days gestation.  2. Oligohydramnios.  3. Meconium-stained amniotic fluid.  4. Late decelerations.   POSTOPERATIVE DIAGNOSES:  1. Intrauterine pregnancy at 40 weeks 3 days gestation.  2. Oligohydramnios.  3. Meconium-stained amniotic fluid.  4. Late decelerations.  5. Pathology pending.   OPERATION/PROCEDURE:  Primary low transverse cesarean section.   SURGEON:  Rudy Jew. Ashley Royalty, M.D.   ANESTHESIA:  Epidural.   FINDINGS:  A 6 pound 13 ounce female, Apgars 5 at one minute, 9 at five  minutes.  Sent to the newborn nursery.  Cord pH 7.15, PCO2 61, PO2 16.6, bicarb 20.2, base excess -9.7.   ESTIMATED BLOOD LOSS:  800 mL.   COMPLICATIONS:  None.   PACKS AND DRAINS:  Foley catheter.  X-ray pending.   DESCRIPTION OF PROCEDURE:  The patient was taken to the operating room in an  emergent fashion.  She had a functioning epidural and a Foley catheter in  place.  The epidural was dosed to surgical levels and the patient was  prepped and draped in the usual manner for abdominal surgery.   Pfannenstiel incision was made down to the level of the fascia which was  nicked in the midline and incised transversely with Mayo scissors.  Underlying rectus muscle was separated from the overlying fashion using  sharp and blunt dissection.  The rectus muscles were separated in the  midline exposing the peritoneum which was elevated with hemostats and  __________  Metzenbaum scissors.  The incision was extended longitudinally.  The uterus was identified and a  bladder flap created __________  uterine  serosa and sharply and bluntly dissected the bladder inferiorly.  It was  held in place with a bladder blade.  The uterus was then entered through a  low transverse incision using sharp and blunt dissection.  Thick meconium  was encountered.  The fetal cortex was delivered.  DeLee trap was used to  suction the oropharynx and nasopharynx.  Full delivery was accomplished.  Cord was then triply clamped, cut and infant given immediately to the  waiting pediatrics team.  Arterial cord pH was obtained through an isolated  segment of cord.  Regular cord blood was obtained.  Placenta and membranes  were removed in their entirety and submitted to pathology for histologic  studies.  The uterus is exteriorized.  The uterus was then closed in two  running layers of #1 Vicryl.  The first was a running locking layer.  The  second was a running, intermittently locking, and imbricating layer.  Two  additional figure-of-eight sutures were required to obtain hemostasis.  Hemostasis was noted.  Uterus, tubes and ovaries were inspected and found to  be otherwise quite normal.  They are returned to the abdominal cavity.  Copious irrigation was accomplished.  Hemostasis was noted.   The fascia was then closed with 0 Vicryl in a running fashion.  The skin was  closed with staples.  The patient tolerated the procedure extremely well and  was returned to the recovery room in good condition.                                               James A. Ashley Royalty, M.D.    JAM/MEDQ  D:  10/09/2003  T:  10/09/2003  Job:  [id number]

## 2010-12-16 LAB — CBC
HCT: 41.8
Hemoglobin: 14.5
MCHC: 34.7
MCV: 91
Platelets: 136 — ABNORMAL LOW
RBC: 4.6
RDW: 12.1
WBC: 5.1

## 2010-12-16 LAB — DIFFERENTIAL
Basophils Absolute: 0
Basophils Relative: 0
Eosinophils Absolute: 0.1
Eosinophils Relative: 1
Lymphocytes Relative: 6 — ABNORMAL LOW
Lymphs Abs: 0.3 — ABNORMAL LOW
Monocytes Absolute: 0.3
Monocytes Relative: 6
Neutro Abs: 4.4
Neutrophils Relative %: 87 — ABNORMAL HIGH

## 2010-12-16 LAB — URINALYSIS, ROUTINE W REFLEX MICROSCOPIC
Bilirubin Urine: NEGATIVE
Glucose, UA: NEGATIVE
Hgb urine dipstick: NEGATIVE
Ketones, ur: NEGATIVE
Nitrite: NEGATIVE
Protein, ur: NEGATIVE
Specific Gravity, Urine: 1.021
Urobilinogen, UA: 0.2
pH: 6.5

## 2010-12-16 LAB — BASIC METABOLIC PANEL
BUN: 7
CO2: 26
Calcium: 9.1
Chloride: 103
Creatinine, Ser: 0.68
GFR calc Af Amer: >60
GFR calc non Af Amer: >60
Glucose, Bld: 80
Potassium: 3.8
Sodium: 137

## 2010-12-16 LAB — HEPATIC FUNCTION PANEL
ALT: 16
AST: 22
Albumin: 4.2
Alkaline Phosphatase: 72
Bilirubin, Direct: 0.2
Indirect Bilirubin: 0.6
Total Bilirubin: 0.8
Total Protein: 6.6

## 2010-12-16 LAB — WET PREP, GENITAL
Clue Cells Wet Prep HPF POC: NONE SEEN
Trich, Wet Prep: NONE SEEN
Yeast Wet Prep HPF POC: NONE SEEN

## 2010-12-16 LAB — LIPASE, BLOOD: Lipase: 22

## 2010-12-16 LAB — GC/CHLAMYDIA PROBE AMP, GENITAL
Chlamydia, DNA Probe: NEGATIVE
GC Probe Amp, Genital: NEGATIVE

## 2010-12-16 LAB — PREGNANCY, URINE: Preg Test, Ur: NEGATIVE

## 2013-06-04 ENCOUNTER — Other Ambulatory Visit: Payer: Self-pay | Admitting: Internal Medicine

## 2013-06-04 DIAGNOSIS — H539 Unspecified visual disturbance: Secondary | ICD-10-CM

## 2013-06-04 DIAGNOSIS — R209 Unspecified disturbances of skin sensation: Secondary | ICD-10-CM

## 2013-06-05 ENCOUNTER — Ambulatory Visit
Admission: RE | Admit: 2013-06-05 | Discharge: 2013-06-05 | Disposition: A | Discharge status: Home / Self Care | Payer: Private Health Insurance - Indemnity | Source: Ambulatory Visit | Attending: Internal Medicine | Admitting: Internal Medicine

## 2013-06-05 DIAGNOSIS — R209 Unspecified disturbances of skin sensation: Secondary | ICD-10-CM

## 2013-06-05 DIAGNOSIS — H539 Unspecified visual disturbance: Secondary | ICD-10-CM

## 2013-06-05 IMAGING — CT CT HEAD W/O CM
2 series · 16 of 30 positions shown, 20 images · non-contrast
Comparison: None.

CLINICAL DATA: Loss of vision in right eye 2 days ago, with
numbness in face and head for 1 week

EXAM:
CT HEAD WITHOUT CONTRAST
TECHNIQUE: Contiguous axial images were obtained from the base of the skull
through the vertex without intravenous contrast.

[Series 2: head w/o · axial · non-contrast · 0.49mm/px · z∈[+16,+137]mm · 13 of 28 slices shown, 17 images]
[im 2/28  brain]
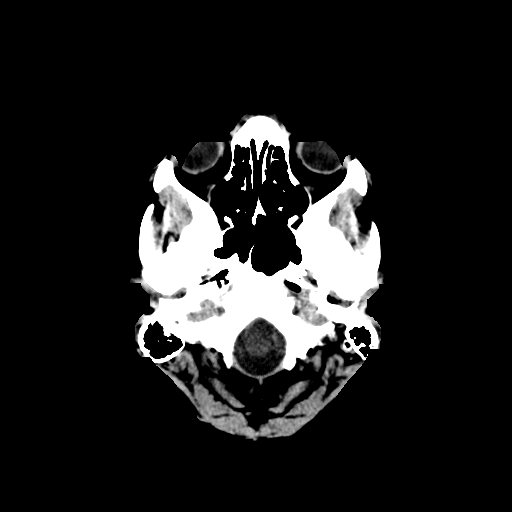
[im 2/28  bone]
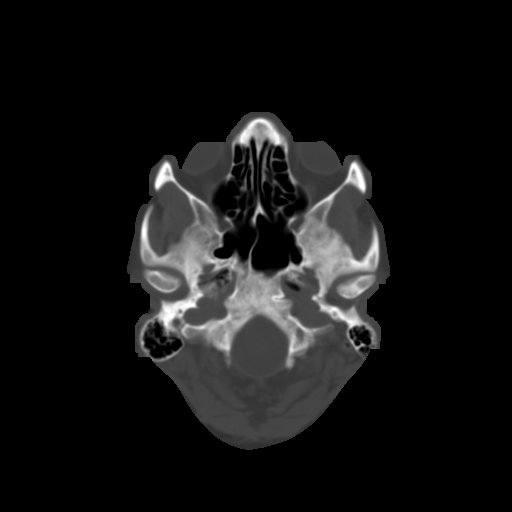
[im 4/28  brain]
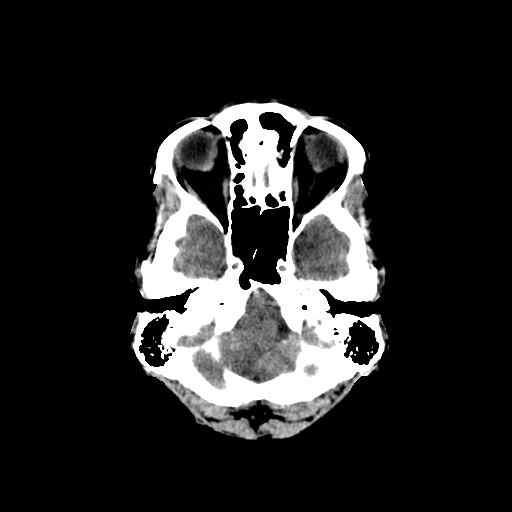
[im 6/28  brain]
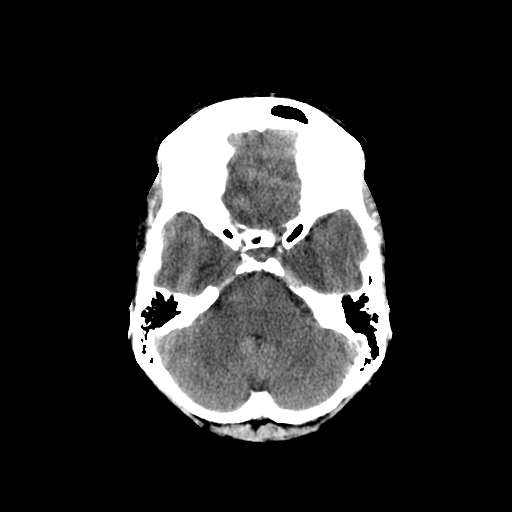
[im 8/28  brain]
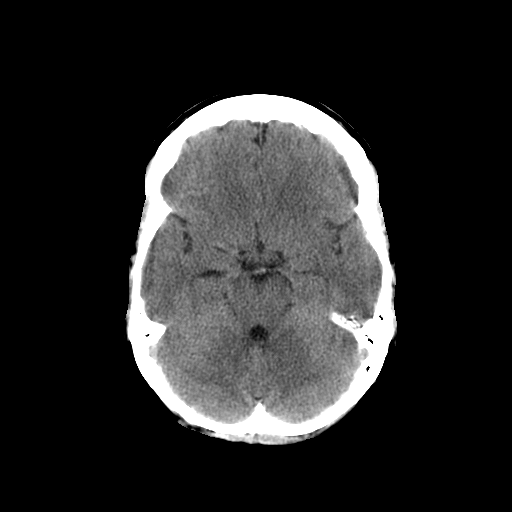
[im 10/28  brain]
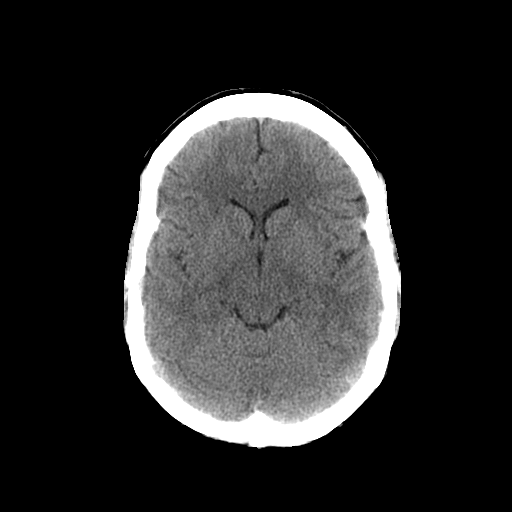
[im 10/28  bone]
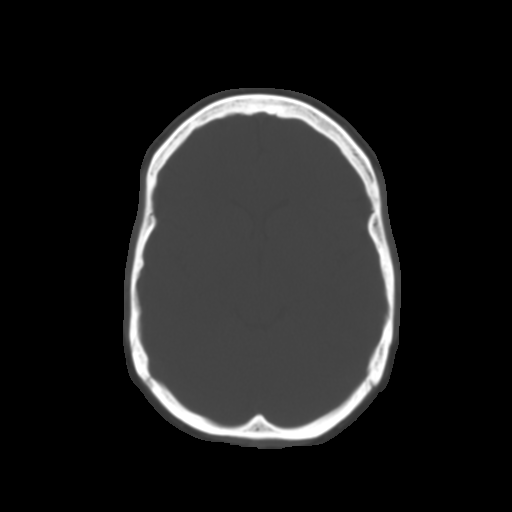
[im 12/28  brain]
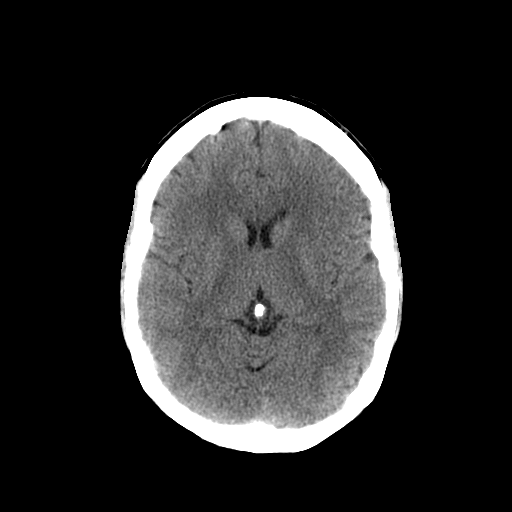
[im 14/28  brain]
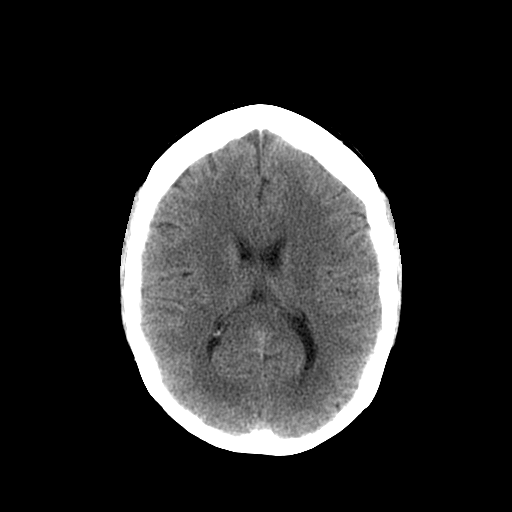
[im 16/28  brain]
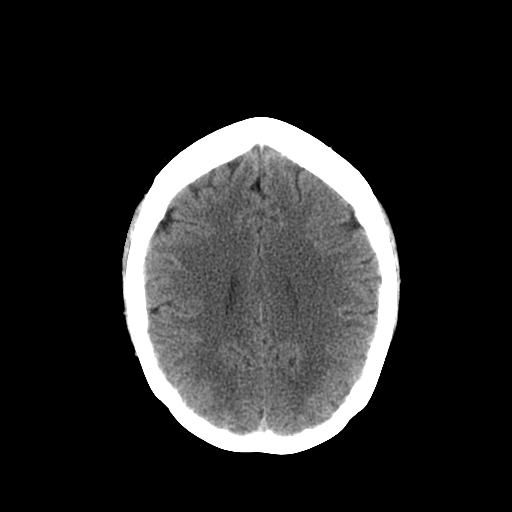
[im 18/28  brain]
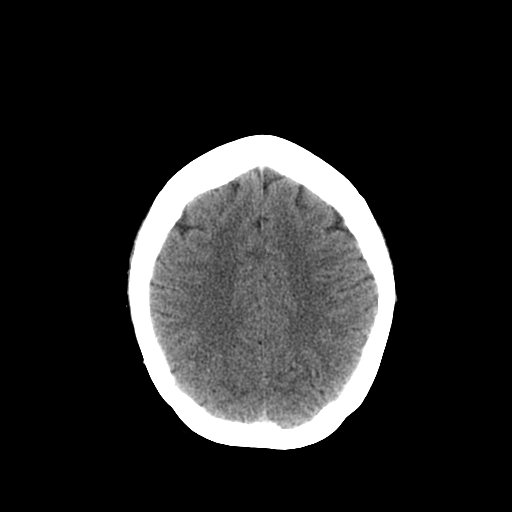
[im 18/28  bone]
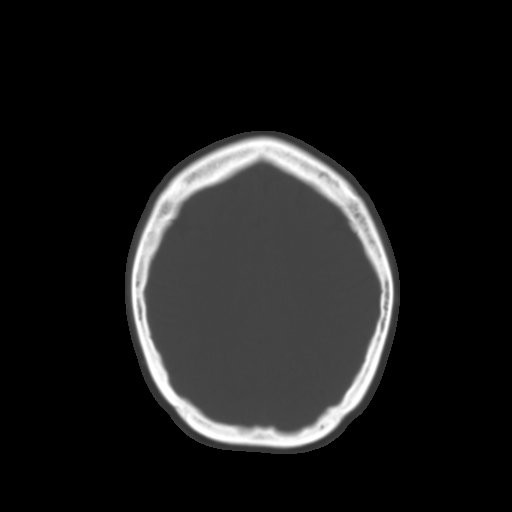
[im 20/28  brain]
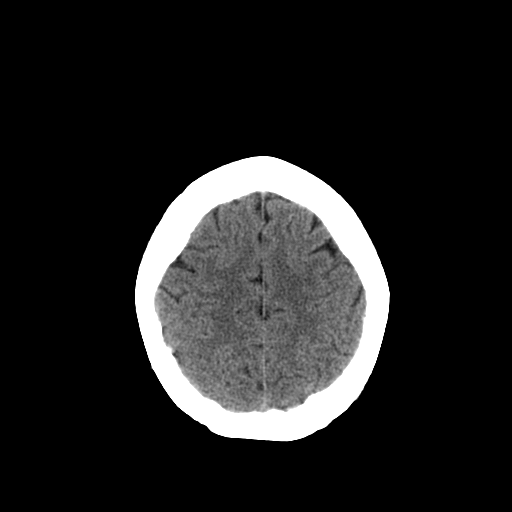
[im 22/28  brain]
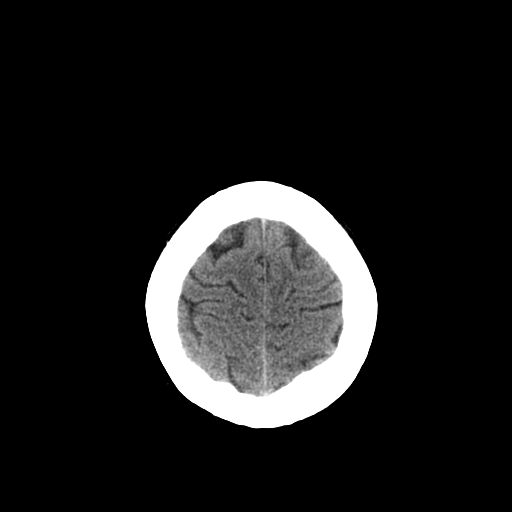
[im 24/28  brain]
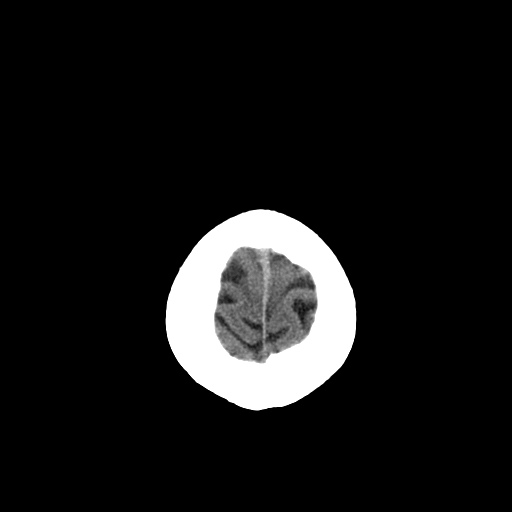
[im 26/28  brain]
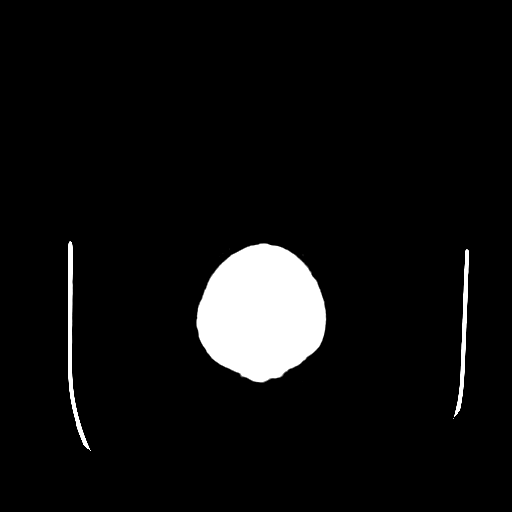
[im 26/28  bone]
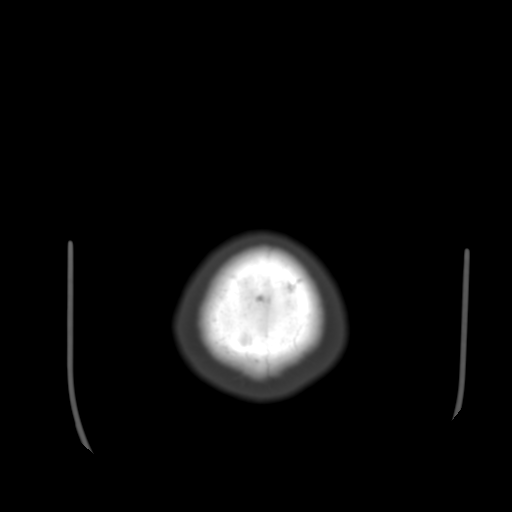

[Series 3: head bone · axial · 0.49mm/px · z∈[+16,+57]mm · 3 of 28 slices shown]
[im 2/28  bone]
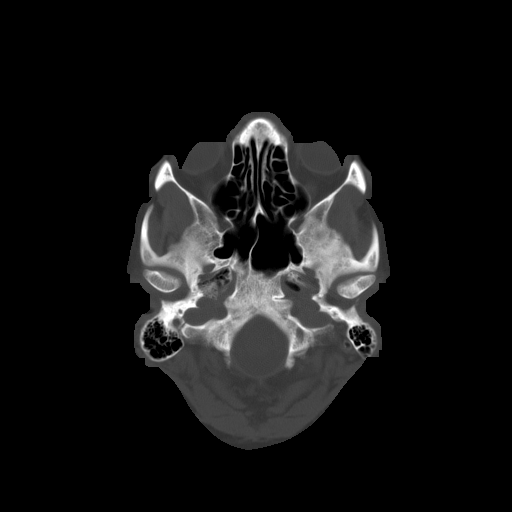
[im 6/28  bone]
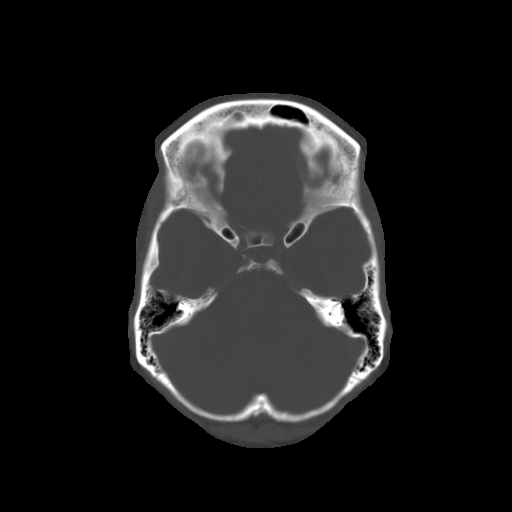
[im 10/28  bone]
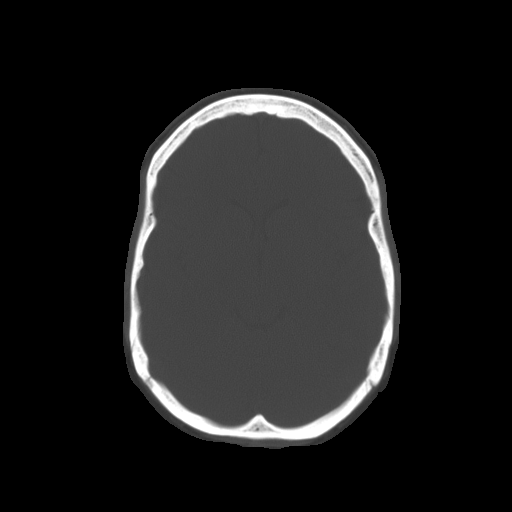

[16 of 30 positions shown; findings below may reference images not displayed]

FINDINGS: No mass lesion. No midline shift. No acute hemorrhage or hematoma.
No extra-axial fluid collections. No evidence of acute infarction.
No periorbital abnormalities. No abnormalities in the occipital
lobes or in the region of the sella turcica. Calvarium is intact.
IMPRESSION: Negative

## 2013-06-12 ENCOUNTER — Encounter (INDEPENDENT_AMBULATORY_CARE_PROVIDER_SITE_OTHER): Payer: Self-pay | Admitting: General Surgery

## 2013-06-20 ENCOUNTER — Ambulatory Visit (INDEPENDENT_AMBULATORY_CARE_PROVIDER_SITE_OTHER): Payer: Private Health Insurance - Indemnity | Admitting: General Surgery

## 2013-06-24 ENCOUNTER — Ambulatory Visit (INDEPENDENT_AMBULATORY_CARE_PROVIDER_SITE_OTHER): Payer: Private Health Insurance - Indemnity | Admitting: General Surgery

## 2013-06-24 ENCOUNTER — Encounter (INDEPENDENT_AMBULATORY_CARE_PROVIDER_SITE_OTHER): Payer: Self-pay | Admitting: General Surgery

## 2013-06-24 VITALS — BP 116/80 | HR 80 | Temp 98.7°F | Resp 14 | Ht 62.0 in | Wt 209.6 lb

## 2013-06-24 DIAGNOSIS — K644 Residual hemorrhoidal skin tags: Secondary | ICD-10-CM

## 2013-06-24 DIAGNOSIS — K648 Other hemorrhoids: Secondary | ICD-10-CM | POA: Insufficient documentation

## 2013-06-24 NOTE — Progress Notes (Signed)
Patient ID: Lynn Simpson, female   DOB: [birth year], 43 y.o.   MRN: [id number]  Chief Complaint  Patient presents with  . New Evaluation    eval hemms    HPI Lynn Simpson is a 43 y.o. female.  We are asked to see the patient in consultation by Dr. Matthew Saras to evaluate her for hemorrhoids. The patient is a 43 year old white female who states that she has had problems with hemorrhoids for the last 20 years since her children were born. She mostly reports periods of constipation words. Difficult to pass the stool. She will intermittently have episodes of rectal bleeding with bowel movements. She denies any itching. She does have some occasional burning. She denies any rectal pain today.  HPI  Past Medical History  Diagnosis Date  . Migraine   . IBS (irritable bowel syndrome)     Past Surgical History  Procedure Laterality Date  . Cesarean section      10/09/03    Family History  Problem Relation Age of Onset  . Cancer Mother 44    breast  . Cancer Father 61    lung  . Cancer Sister 70    breast  . Cancer Maternal Grandmother     breast    Social History History  Substance Use Topics  . Smoking status: Former Smoker    Quit date: 01/25/2012  . Smokeless tobacco: Not on file  . Alcohol Use: No    Allergies  Allergen Reactions  . Wellbutrin [Bupropion]     No current outpatient prescriptions on file.   No current facility-administered medications for this visit.    Review of Systems Review of Systems  Constitutional: Negative.   HENT: Negative.   Eyes: Negative.   Respiratory: Negative.   Cardiovascular: Negative.   Gastrointestinal: Positive for constipation and anal bleeding.  Endocrine: Negative.   Genitourinary: Negative.   Musculoskeletal: Negative.   Skin: Negative.   Allergic/Immunologic: Negative.   Neurological: Negative.   Hematological: Negative.   Psychiatric/Behavioral: Negative.     Blood pressure 116/80, pulse 80, temperature 98.7 F  (37.1 C), temperature source Temporal, resp. rate 14, height 5\' 2"  (1.575 m), weight 209 lb 9.6 oz (95.074 kg), last menstrual period 05/24/2013.  Physical Exam Physical Exam  Constitutional: She is oriented to person, place, and time. She appears well-developed and well-nourished.  HENT:  Head: Normocephalic and atraumatic.  Eyes: Conjunctivae and EOM are normal. Pupils are equal, round, and reactive to light.  Neck: Normal range of motion. Neck supple.  Cardiovascular: Normal rate, regular rhythm and normal heart sounds.   Pulmonary/Chest: Effort normal and breath sounds normal.  Abdominal: Soft. Bowel sounds are normal.  Genitourinary:  She has some small soft external hemorrhoidal skin tags. On digital exam she has good rectal tone and no palpable mass. She does have a lot of hard stool in the rectal vault. On anoscopic exam she did have some mild internal hemorrhoidal disease mostly posterior  Musculoskeletal: Normal range of motion.  Lymphadenopathy:    She has no cervical adenopathy.  Neurological: She is alert and oriented to person, place, and time.  Skin: Skin is warm and dry.  Psychiatric: She has a normal mood and affect. Her behavior is normal.    Data Reviewed As above  Assessment    The patient has some small soft external hemorrhoidal skin tags and I suspect most of her bleeding is coming from her significant constipation and small internal hemorrhoids. I suspect that if  she can Avoid periods of constipation her bowel function may be better and she may not bleed     Plan    At this point I will have her start on daily MiraLAX and she will call us if her symptoms do not improve.        TOTH III,Tyreona Panjwani S 06/24/2013, 1:44 PM

## 2013-06-24 NOTE — Patient Instructions (Signed)
Use miralax daily so stool is soft and moving daily

## 2013-07-04 ENCOUNTER — Ambulatory Visit (HOSPITAL_COMMUNITY)
Admission: RE | Admit: 2013-07-04 | Discharge: 2013-07-04 | Disposition: A | Discharge status: Home / Self Care | Payer: Private Health Insurance - Indemnity | Source: Ambulatory Visit | Attending: Vascular Surgery | Admitting: Vascular Surgery

## 2013-07-04 ENCOUNTER — Other Ambulatory Visit (HOSPITAL_COMMUNITY): Payer: Self-pay | Admitting: Internal Medicine

## 2013-07-04 DIAGNOSIS — G510 Bell's palsy: Secondary | ICD-10-CM | POA: Insufficient documentation

## 2013-07-04 DIAGNOSIS — H539 Unspecified visual disturbance: Secondary | ICD-10-CM | POA: Diagnosis present

## 2014-06-18 ENCOUNTER — Other Ambulatory Visit: Payer: Self-pay | Admitting: Obstetrics and Gynecology

## 2014-06-19 LAB — CYTOLOGY - PAP

## 2014-08-07 ENCOUNTER — Other Ambulatory Visit: Payer: Self-pay | Admitting: Obstetrics and Gynecology

## 2015-06-23 DIAGNOSIS — M545 Low back pain: Secondary | ICD-10-CM | POA: Diagnosis not present

## 2015-06-23 DIAGNOSIS — E669 Obesity, unspecified: Secondary | ICD-10-CM | POA: Diagnosis not present

## 2015-06-23 DIAGNOSIS — Z6841 Body Mass Index (BMI) 40.0 and over, adult: Secondary | ICD-10-CM | POA: Diagnosis not present

## 2015-06-23 DIAGNOSIS — R5383 Other fatigue: Secondary | ICD-10-CM | POA: Diagnosis not present

## 2015-06-23 DIAGNOSIS — R600 Localized edema: Secondary | ICD-10-CM | POA: Diagnosis not present

## 2015-06-23 DIAGNOSIS — R635 Abnormal weight gain: Secondary | ICD-10-CM | POA: Diagnosis not present

## 2015-07-31 DIAGNOSIS — R600 Localized edema: Secondary | ICD-10-CM | POA: Diagnosis not present

## 2015-07-31 DIAGNOSIS — J309 Allergic rhinitis, unspecified: Secondary | ICD-10-CM | POA: Diagnosis not present

## 2015-07-31 DIAGNOSIS — J449 Chronic obstructive pulmonary disease, unspecified: Secondary | ICD-10-CM | POA: Diagnosis not present

## 2015-07-31 DIAGNOSIS — F17200 Nicotine dependence, unspecified, uncomplicated: Secondary | ICD-10-CM | POA: Diagnosis not present

## 2015-09-01 DIAGNOSIS — J449 Chronic obstructive pulmonary disease, unspecified: Secondary | ICD-10-CM | POA: Diagnosis not present

## 2015-09-01 DIAGNOSIS — J309 Allergic rhinitis, unspecified: Secondary | ICD-10-CM | POA: Diagnosis not present

## 2015-09-01 DIAGNOSIS — F17200 Nicotine dependence, unspecified, uncomplicated: Secondary | ICD-10-CM | POA: Diagnosis not present

## 2015-09-01 DIAGNOSIS — J019 Acute sinusitis, unspecified: Secondary | ICD-10-CM | POA: Diagnosis not present

## 2015-09-28 DIAGNOSIS — Z3200 Encounter for pregnancy test, result unknown: Secondary | ICD-10-CM | POA: Diagnosis not present

## 2015-09-28 DIAGNOSIS — R1031 Right lower quadrant pain: Secondary | ICD-10-CM | POA: Diagnosis not present

## 2015-09-28 DIAGNOSIS — R102 Pelvic and perineal pain: Secondary | ICD-10-CM | POA: Diagnosis not present

## 2015-09-28 DIAGNOSIS — Z01419 Encounter for gynecological examination (general) (routine) without abnormal findings: Secondary | ICD-10-CM | POA: Diagnosis not present

## 2015-09-30 ENCOUNTER — Encounter (HOSPITAL_BASED_OUTPATIENT_CLINIC_OR_DEPARTMENT_OTHER): Payer: Self-pay

## 2015-09-30 ENCOUNTER — Emergency Department (HOSPITAL_BASED_OUTPATIENT_CLINIC_OR_DEPARTMENT_OTHER): Payer: BLUE CROSS/BLUE SHIELD

## 2015-09-30 ENCOUNTER — Emergency Department (HOSPITAL_BASED_OUTPATIENT_CLINIC_OR_DEPARTMENT_OTHER)
Admission: EM | Admit: 2015-09-30 | Discharge: 2015-09-30 | Disposition: A | Discharge status: Home / Self Care | Payer: BLUE CROSS/BLUE SHIELD | Attending: Emergency Medicine | Admitting: Emergency Medicine

## 2015-09-30 DIAGNOSIS — F17200 Nicotine dependence, unspecified, uncomplicated: Secondary | ICD-10-CM | POA: Diagnosis not present

## 2015-09-30 DIAGNOSIS — Z79899 Other long term (current) drug therapy: Secondary | ICD-10-CM | POA: Diagnosis not present

## 2015-09-30 DIAGNOSIS — R1031 Right lower quadrant pain: Secondary | ICD-10-CM | POA: Diagnosis not present

## 2015-09-30 DIAGNOSIS — D3502 Benign neoplasm of left adrenal gland: Secondary | ICD-10-CM | POA: Diagnosis not present

## 2015-09-30 HISTORY — DX: Unspecified ovarian cyst, unspecified side: N83.209

## 2015-09-30 LAB — CBC WITH DIFFERENTIAL/PLATELET
Basophils Absolute: 0 10*3/uL (ref 0.0–0.1)
Basophils Relative: 0 %
Eosinophils Absolute: 0.3 10*3/uL (ref 0.0–0.7)
Eosinophils Relative: 4 %
HCT: 42.4 % (ref 36.0–46.0)
Hemoglobin: 14.6 g/dL (ref 12.0–15.0)
Lymphocytes Relative: 30 %
Lymphs Abs: 2.4 10*3/uL (ref 0.7–4.0)
MCH: 31.2 pg (ref 26.0–34.0)
MCHC: 34.4 g/dL (ref 30.0–36.0)
MCV: 90.6 fL (ref 78.0–100.0)
Monocytes Absolute: 0.6 10*3/uL (ref 0.1–1.0)
Monocytes Relative: 7 %
Neutro Abs: 4.8 10*3/uL (ref 1.7–7.7)
Neutrophils Relative %: 59 %
Platelets: 194 10*3/uL (ref 150–400)
RBC: 4.68 MIL/uL (ref 3.87–5.11)
RDW: 12.4 % (ref 11.5–15.5)
WBC: 8.1 10*3/uL (ref 4.0–10.5)

## 2015-09-30 LAB — COMPREHENSIVE METABOLIC PANEL
ALT: 18 U/L (ref 14–54)
AST: 17 U/L (ref 15–41)
Albumin: 4.1 g/dL (ref 3.5–5.0)
Alkaline Phosphatase: 98 U/L (ref 38–126)
Anion gap: 7 (ref 5–15)
BUN: 9 mg/dL (ref 6–20)
CO2: 25 mmol/L (ref 22–32)
Calcium: 9.2 mg/dL (ref 8.9–10.3)
Chloride: 104 mmol/L (ref 101–111)
Creatinine, Ser: 0.63 mg/dL (ref 0.44–1.00)
GFR calc Af Amer: >60 mL/min (ref >60)
GFR calc non Af Amer: >60 mL/min (ref >60)
Glucose, Bld: 86 mg/dL (ref 65–99)
Potassium: 4 mmol/L (ref 3.5–5.1)
Sodium: 136 mmol/L (ref 135–145)
Total Bilirubin: 0.5 mg/dL (ref 0.3–1.2)
Total Protein: 7.2 g/dL (ref 6.5–8.1)

## 2015-09-30 LAB — URINALYSIS, ROUTINE W REFLEX MICROSCOPIC
Bilirubin Urine: NEGATIVE
Glucose, UA: NEGATIVE mg/dL
Hgb urine dipstick: NEGATIVE
Ketones, ur: NEGATIVE mg/dL
Leukocytes, UA: NEGATIVE
Nitrite: NEGATIVE
Protein, ur: NEGATIVE mg/dL
Specific Gravity, Urine: 1.018 (ref 1.005–1.030)
pH: 5.5 (ref 5.0–8.0)

## 2015-09-30 LAB — LIPASE, BLOOD: Lipase: 25 U/L (ref 11–51)

## 2015-09-30 LAB — PREGNANCY, URINE: Preg Test, Ur: NEGATIVE

## 2015-09-30 IMAGING — CT CT ABD-PELV W/ CM
2 of 5 series · 16 of 46 positions shown, 18 images · IV contrast (APPLIED)
Comparison: [DATE] there is mild bibasilar lung atelectasis.
Lung bases otherwise are clear.

CLINICAL DATA: Right lower quadrant pain for 9 days. Intermittent
diarrhea and constipation

EXAM:
CT ABDOMEN AND PELVIS WITH CONTRAST
TECHNIQUE: Multidetector CT imaging of the abdomen and pelvis was performed
using the standard protocol following bolus administration of
intravenous contrast. Oral contrast was also administered.
CONTRAST:  100mL [BN] IOPAMIDOL ([BN]) INJECTION 61%

[Series 2: axial st · axial · 0.98mm/px · z∈[-544,-24]mm · 13 of 116 slices shown, 15 images]
[im 6/116  soft-tissue]
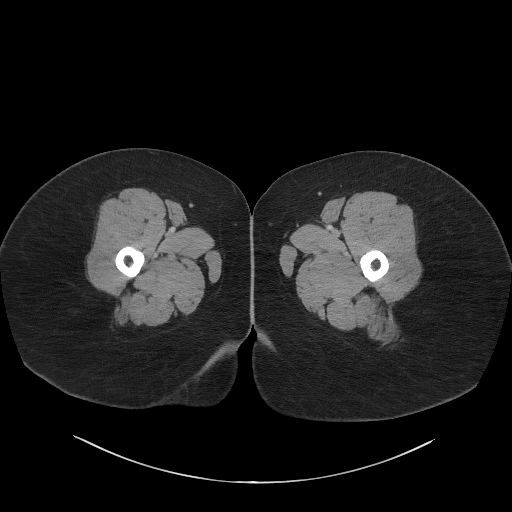
[im 6/116  bone]
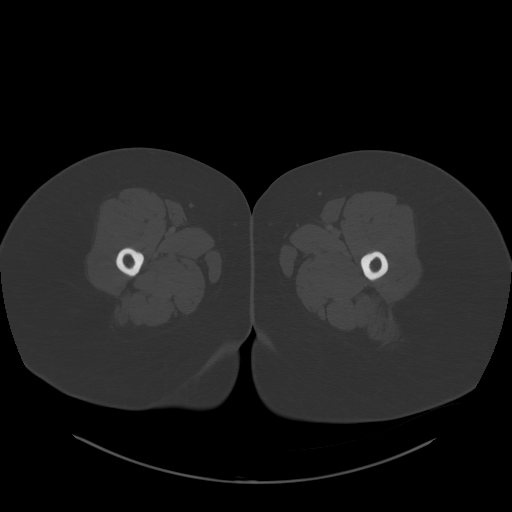
[im 18/116  soft-tissue]
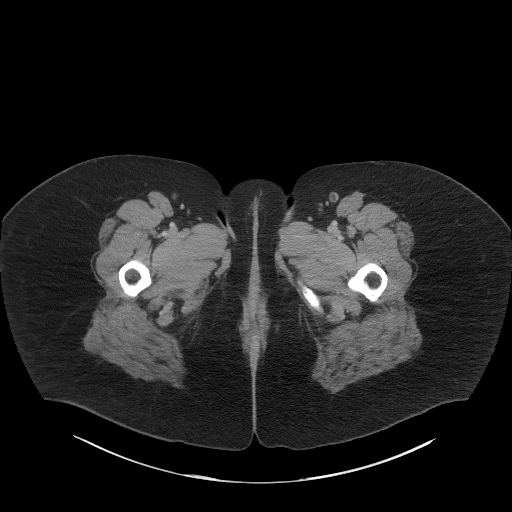
[im 24/116  soft-tissue]
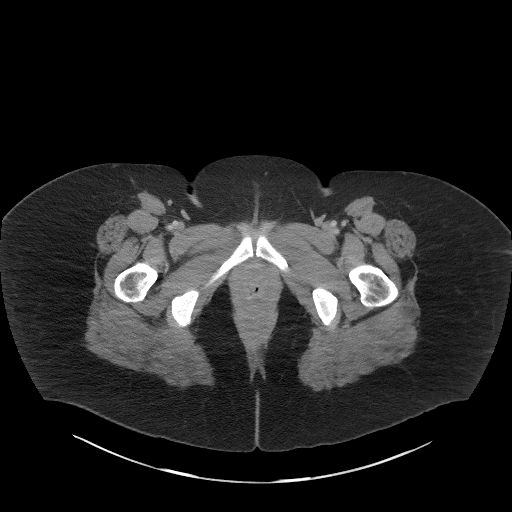
[im 35/116  soft-tissue]
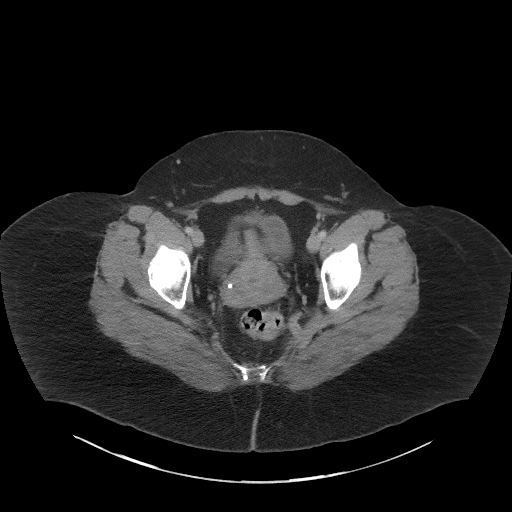
[im 41/116  soft-tissue]
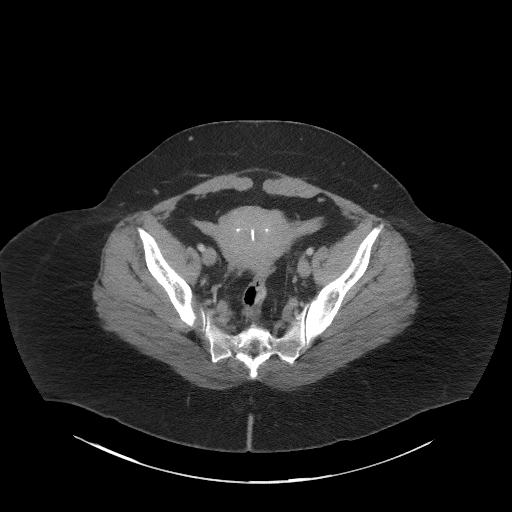
[im 52/116  soft-tissue]
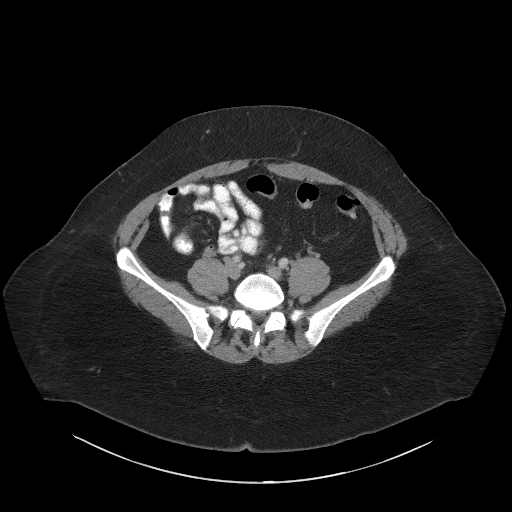
[im 58/116  soft-tissue]
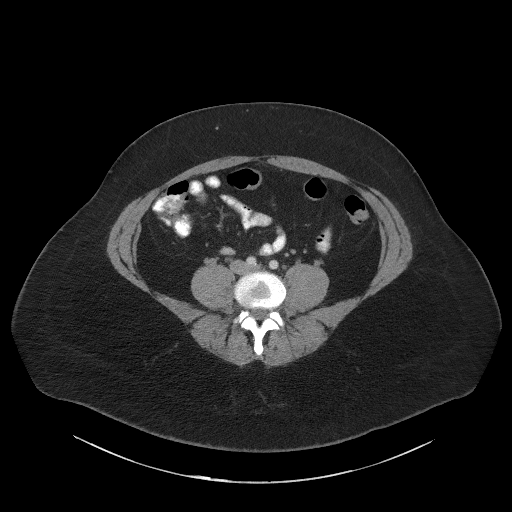
[im 64/116  soft-tissue]
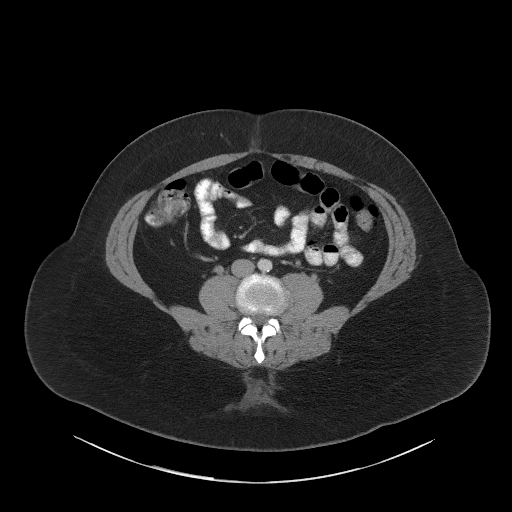
[im 75/116  soft-tissue]
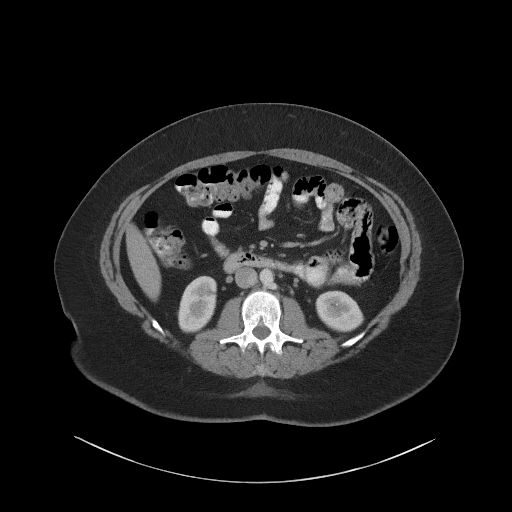
[im 75/116  bone]
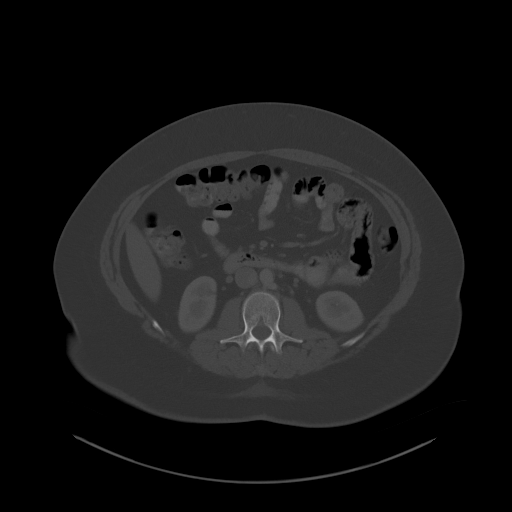
[im 81/116  soft-tissue]
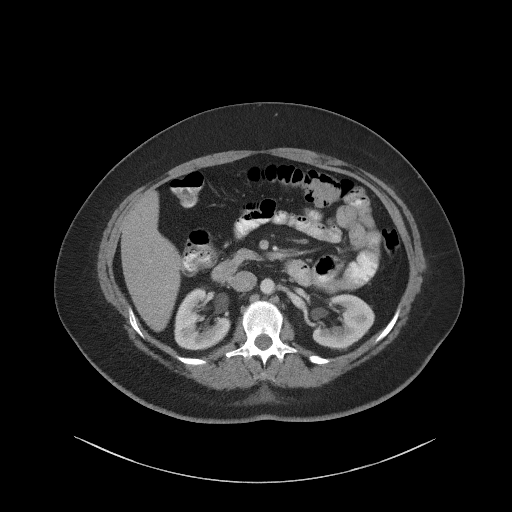
[im 93/116  soft-tissue]
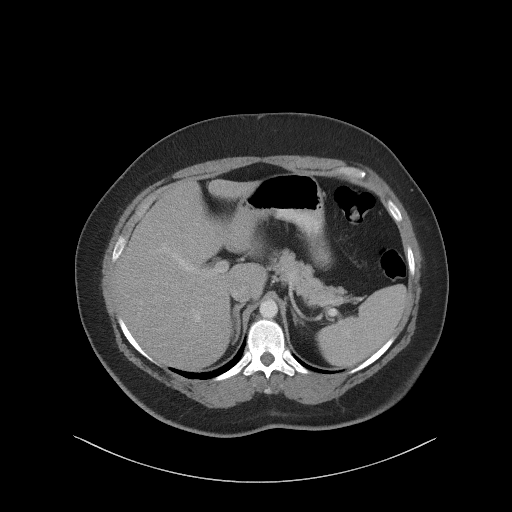
[im 98/116  soft-tissue]
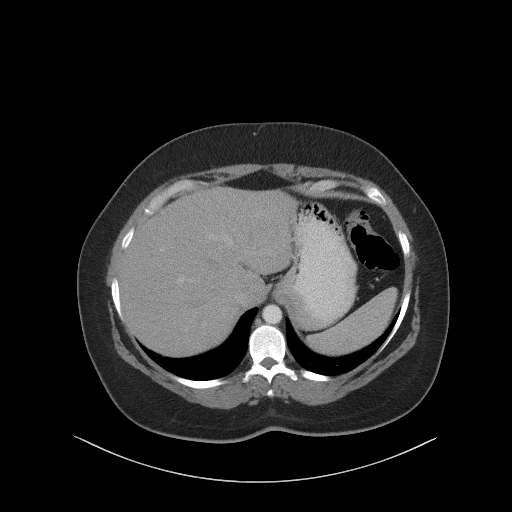
[im 110/116  soft-tissue]
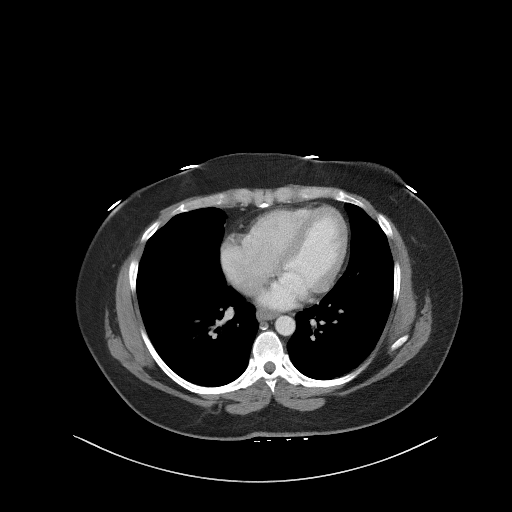

[Series 5: coronal st · coronal · 1.08mm/px · 3 of 125 slices shown]
[im 42/125  soft-tissue]
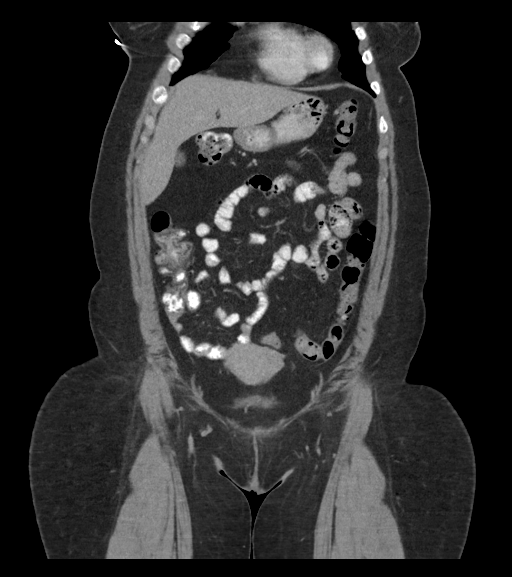
[im 56/125  soft-tissue]
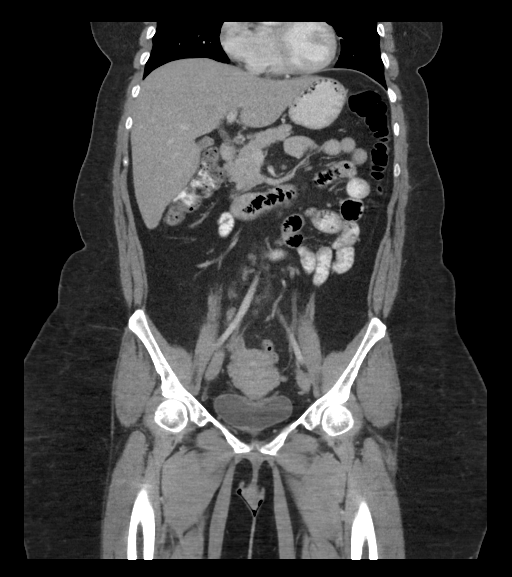
[im 69/125  soft-tissue]
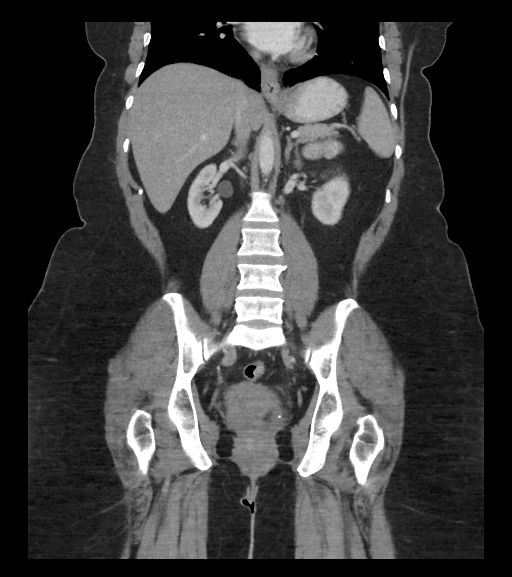

[16 of 46 positions shown; findings below may reference images not displayed]

FINDINGS: Lower chest:  There is no lung base edema or consolidation.

Hepatobiliary: No focal liver lesions are evident. Gallbladder wall
is not appreciably thickened. There is no biliary duct dilatation.

Pancreas: There is no pancreatic mass or inflammatory focus.

Spleen: No splenic lesions are evident.

Adrenals/Urinary Tract: Right adrenal appears normal. There is a
stable 6 mm left adrenal adenoma. Kidneys bilaterally show no
appreciable mass or hydronephrosis on either side. There is no renal
or ureteral calculus on either side. Urinary bladder is midline with
wall thickness within normal limits.

Stomach/Bowel: There is no appreciable bowel wall or mesenteric
thickening. There is no bowel obstruction. No free air or portal
venous air.

Vascular/Lymphatic: There is minimal calcification in the distal
abdominal aorta proximal right common iliac artery. The major
mesenteric vessels appear patent. Incidental note is made of a
circumaortic left renal vein, an anatomic variant.

Reproductive: The uterus is anteverted. There is an intrauterine
device within the endometrium. There is no pelvic mass or pelvic
fluid collection.

Other: Appendix appears normal. There is no abscess or ascites in
the abdomen or pelvis. There is a small ventral hernia containing
only fat.

Musculoskeletal: There are no blastic or lytic bone lesions. No
intramuscular lesions are evident. There is midline soft tissue
thickening in the back region, separate from the muscles. No
well-defined fluid is seen in this area.
IMPRESSION: Appendix appears normal. No bowel wall thickening or bowel
obstruction. No abscess. There is no adnexal lesion appreciable on
either side.

No renal or ureteral calculus.  No hydronephrosis.

Soft tissue thickening in the posterior back region without
well-defined fluid collection. Question residua of trauma in this
area. The bony structures in this region appear normal.

Rather minimal ventral hernia containing only fat.

Tiny left adrenal adenoma, stable.

Slight degree of aortic atherosclerosis.

## 2015-09-30 MED ORDER — IOPAMIDOL (ISOVUE-300) INJECTION 61%
100.0000 mL | Freq: Once | INTRAVENOUS | Status: AC | PRN
  Administered 2015-09-30: 100 mL via INTRAVENOUS

## 2015-09-30 MED ORDER — KETOROLAC TROMETHAMINE 30 MG/ML IJ SOLN
30.0000 mg | Freq: Once | INTRAMUSCULAR | Status: AC
  Administered 2015-09-30: 30 mg via INTRAVENOUS
  Filled 2015-09-30: qty 1

## 2015-09-30 MED ORDER — SODIUM CHLORIDE 0.9 % IV SOLN
INTRAVENOUS | Status: DC
  Administered 2015-09-30: 12:00:00 via INTRAVENOUS

## 2015-09-30 MED ORDER — NAPROXEN 500 MG PO TABS: 500.0000 mg | ORAL_TABLET | Freq: Two times a day (BID) | ORAL | Status: DC

## 2015-09-30 NOTE — ED Notes (Signed)
Pt given d/c instructions as per chart. Verbalizes understanding. No questions. Rx x 1 

## 2015-09-30 NOTE — ED Notes (Signed)
C/o right lower abd pain with rectal pressure-started July-was seen by GYN Monday-neg US-awaiting labs-pt c/o cont'd pain-NAD-steady gait

## 2015-09-30 NOTE — ED Notes (Signed)
Patient transported to CT 

## 2015-09-30 NOTE — ED Notes (Signed)
Pt states she was seen at her OB-GYN on Mon. Labs done. "Not OB related". Continues to have RLQ pain which radiates to her rectum at times. "Feels like I need to urinate or have a BM." Describes as pressure. Last BM was yesterday "formed, but thin" Nausea with drinking fluids. Decreased appetite.

## 2015-09-30 NOTE — Discharge Instructions (Signed)
°  Abdominal Pain, Adult Many things can cause abdominal pain. Usually, abdominal pain is not caused by a disease and will improve without treatment. It can often be observed and treated at home. Your health care provider will do a physical exam and possibly order blood tests and X-rays to help determine the seriousness of your pain. However, in many cases, more time must pass before a clear cause of the pain can be found. Before that point, your health care provider may not know if you need more testing or further treatment. HOME CARE INSTRUCTIONS Monitor your abdominal pain for any changes. The following actions may help to alleviate any discomfort you are experiencing:  Only take over-the-counter or prescription medicines as directed by your health care provider.  Do not take laxatives unless directed to do so by your health care provider.  Try a clear liquid diet (broth, tea, or water) as directed by your health care provider. Slowly move to a bland diet as tolerated. SEEK MEDICAL CARE IF:  You have unexplained abdominal pain.  You have abdominal pain associated with nausea or diarrhea.  You have pain when you urinate or have a bowel movement.  You experience abdominal pain that wakes you in the night.  You have abdominal pain that is worsened or improved by eating food.  You have abdominal pain that is worsened with eating fatty foods.  You have a fever. SEEK IMMEDIATE MEDICAL CARE IF:  Your pain does not go away within 2 hours.  You keep throwing up (vomiting).  Your pain is felt only in portions of the abdomen, such as the right side or the left lower portion of the abdomen.  You pass bloody or black tarry stools. MAKE SURE YOU:  Understand these instructions.  Will watch your condition.  Will get help right away if you are not doing well or get worse.   This information is not intended to replace advice given to you by your health care provider. Make sure you discuss  any questions you have with your health care provider.   Document Released: 12/15/2004 Document Revised: 11/26/2014 Document Reviewed: 11/14/2012 Elsevier Interactive Patient Education 2016 [employer].  Chartered certified accountant Patient Education [employer].

## 2015-09-30 NOTE — ED Provider Notes (Signed)
CSN: [id number]     Arrival date & time 09/30/15  1105 History   First MD Initiated Contact with Patient 09/30/15 1116     CC: Abdominal pain HPI Pt has been having pain in her abdomen since Monday.  She saw her OB GYN doctor on Monday.   She had an exam, lab tests and an ultrasound.  She continues to have pain though.  THe pain is in the lower abdomen.  It gets worse in intensity.  She feels a pressure as if something is going to pop.  She also has the urge to have a BM.  No vomiting.  Some loose stools.  Some nausea.  No dysuria. Past Medical History  Diagnosis Date  . Migraine   . IBS (irritable bowel syndrome)   . Ovarian cyst    Past Surgical History  Procedure Laterality Date  . Cesarean section      10/09/03   Family History  Problem Relation Age of Onset  . Cancer Mother 37    breast  . Cancer Father 56    lung  . Cancer Sister 8    breast  . Cancer Maternal Grandmother     breast   Social History  Substance Use Topics  . Smoking status: Current Every Day Smoker  . Smokeless tobacco: None  . Alcohol Use: Yes     Comment: occ   OB History    No data available     Review of Systems  All other systems reviewed and are negative.     Allergies  Bee venom and Wellbutrin  Home Medications   Prior to Admission medications   Medication Sig Start Date End Date Taking? Authorizing Provider  Sertraline HCl (ZOLOFT PO) Take by mouth.   Yes Historical Provider, MD  naproxen (NAPROSYN) 500 MG tablet Take 1 tablet (500 mg total) by mouth 2 (two) times daily. 09/30/15   Dorie Rank, MD   BP 136/78 mmHg  Pulse 68  Temp(Src) 97.6 F (36.4 C) (Oral)  Resp 20  Ht 5\' 2"  (1.575 m)  Wt 108.863 kg  BMI 43.89 kg/m2  SpO2 99% Physical Exam  Constitutional: She appears well-developed and well-nourished. No distress.  HENT:  Head: Normocephalic and atraumatic.  Right Ear: External ear normal.  Left Ear: External ear normal.  Eyes: Conjunctivae are normal. Right eye  exhibits no discharge. Left eye exhibits no discharge. No scleral icterus.  Neck: Neck supple. No tracheal deviation present.  Cardiovascular: Normal rate, regular rhythm and intact distal pulses.   Pulmonary/Chest: Effort normal and breath sounds normal. No stridor. No respiratory distress. She has no wheezes. She has no rales.  Abdominal: Soft. Bowel sounds are normal. She exhibits no distension. There is tenderness in the right lower quadrant. There is no rebound and no guarding.  Genitourinary:  Pelvic exam deferred, saw her OB GYN on monday  Musculoskeletal: She exhibits no edema or tenderness.  Neurological: She is alert. She has normal strength. No cranial nerve deficit (no facial droop, extraocular movements intact, no slurred speech) or sensory deficit. She exhibits normal muscle tone. She displays no seizure activity. Coordination normal.  Skin: Skin is warm and dry. No rash noted.  Psychiatric: She has a normal mood and affect.  Nursing note and vitals reviewed.   ED Course  Procedures (including critical care time) Labs Review Labs Reviewed  PREGNANCY, URINE  URINALYSIS, ROUTINE W REFLEX MICROSCOPIC (NOT AT [facility])  CBC WITH DIFFERENTIAL/PLATELET  COMPREHENSIVE METABOLIC PANEL  LIPASE, BLOOD  Imaging Review Ct Abdomen Pelvis W Contrast  09/30/2015  CLINICAL DATA:  Right lower quadrant pain for 9 days. Intermittent diarrhea and constipation EXAM: CT ABDOMEN AND PELVIS WITH CONTRAST TECHNIQUE: Multidetector CT imaging of the abdomen and pelvis was performed using the standard protocol following bolus administration of intravenous contrast. Oral contrast was also administered. CONTRAST:  175mL ISOVUE-300 IOPAMIDOL (ISOVUE-300) INJECTION 61% COMPARISON:  September 12, 2009 there is mild bibasilar lung atelectasis. Lung bases otherwise are clear. FINDINGS: Lower chest:  There is no lung base edema or consolidation. Hepatobiliary: No focal liver lesions are evident. Gallbladder wall is  not appreciably thickened. There is no biliary duct dilatation. Pancreas: There is no pancreatic mass or inflammatory focus. Spleen: No splenic lesions are evident. Adrenals/Urinary Tract: Right adrenal appears normal. There is a stable 6 mm left adrenal adenoma. Kidneys bilaterally show no appreciable mass or hydronephrosis on either side. There is no renal or ureteral calculus on either side. Urinary bladder is midline with wall thickness within normal limits. Stomach/Bowel: There is no appreciable bowel wall or mesenteric thickening. There is no bowel obstruction. No free air or portal venous air. Vascular/Lymphatic: There is minimal calcification in the distal abdominal aorta proximal right common iliac artery. The major mesenteric vessels appear patent. Incidental note is made of a circumaortic left renal vein, an anatomic variant. Reproductive: The uterus is anteverted. There is an intrauterine device within the endometrium. There is no pelvic mass or pelvic fluid collection. Other: Appendix appears normal. There is no abscess or ascites in the abdomen or pelvis. There is a small ventral hernia containing only fat. Musculoskeletal: There are no blastic or lytic bone lesions. No intramuscular lesions are evident. There is midline soft tissue thickening in the back region, separate from the muscles. No well-defined fluid is seen in this area. IMPRESSION: Appendix appears normal. No bowel wall thickening or bowel obstruction. No abscess. There is no adnexal lesion appreciable on either side. No renal or ureteral calculus.  No hydronephrosis. Soft tissue thickening in the posterior back region without well-defined fluid collection. Question residua of trauma in this area. The bony structures in this region appear normal. Rather minimal ventral hernia containing only fat. Tiny left adrenal adenoma, stable. Slight degree of aortic atherosclerosis. Electronically Signed   By: Lowella Grip III M.D.   On:  09/30/2015 13:44   I have personally reviewed and evaluated these images and lab results as part of my medical decision-making.   Discussed the CT findings with the patient.  She denies any back pain or recent injury.  MDM   Final diagnoses:  Right lower quadrant abdominal pain    Pt has been having pain this week.  Saw her OB GYN who felt there were no acute gynecological issues.  Sx persisted so patient came to the ED.  Labs are unremarkable.  CT scan without acute findings.  No definite source for her sx however no acute emergent issues identified today.  Rx nsaids, follow up with PCP    Dorie Rank, MD 09/30/15 1406

## 2015-10-19 DIAGNOSIS — R8299 Other abnormal findings in urine: Secondary | ICD-10-CM | POA: Diagnosis not present

## 2015-10-19 DIAGNOSIS — Z0000 Encounter for general adult medical examination without abnormal findings: Secondary | ICD-10-CM | POA: Diagnosis not present

## 2015-10-30 DIAGNOSIS — K219 Gastro-esophageal reflux disease without esophagitis: Secondary | ICD-10-CM | POA: Diagnosis not present

## 2015-10-30 DIAGNOSIS — J449 Chronic obstructive pulmonary disease, unspecified: Secondary | ICD-10-CM | POA: Diagnosis not present

## 2015-10-30 DIAGNOSIS — J309 Allergic rhinitis, unspecified: Secondary | ICD-10-CM | POA: Diagnosis not present

## 2015-10-30 DIAGNOSIS — F17200 Nicotine dependence, unspecified, uncomplicated: Secondary | ICD-10-CM | POA: Diagnosis not present

## 2015-10-30 DIAGNOSIS — Z1389 Encounter for screening for other disorder: Secondary | ICD-10-CM | POA: Diagnosis not present

## 2015-10-30 DIAGNOSIS — Z0000 Encounter for general adult medical examination without abnormal findings: Secondary | ICD-10-CM | POA: Diagnosis not present

## 2015-12-01 DIAGNOSIS — Z6841 Body Mass Index (BMI) 40.0 and over, adult: Secondary | ICD-10-CM | POA: Diagnosis not present

## 2015-12-01 DIAGNOSIS — Z1231 Encounter for screening mammogram for malignant neoplasm of breast: Secondary | ICD-10-CM | POA: Diagnosis not present

## 2015-12-01 DIAGNOSIS — Z01419 Encounter for gynecological examination (general) (routine) without abnormal findings: Secondary | ICD-10-CM | POA: Diagnosis not present

## 2015-12-08 ENCOUNTER — Other Ambulatory Visit: Payer: Self-pay | Admitting: Obstetrics and Gynecology

## 2015-12-08 DIAGNOSIS — R928 Other abnormal and inconclusive findings on diagnostic imaging of breast: Secondary | ICD-10-CM

## 2015-12-10 ENCOUNTER — Ambulatory Visit
Admission: RE | Admit: 2015-12-10 | Discharge: 2015-12-10 | Disposition: A | Discharge status: Home / Self Care | Payer: BLUE CROSS/BLUE SHIELD | Source: Ambulatory Visit | Attending: Obstetrics and Gynecology | Admitting: Obstetrics and Gynecology

## 2015-12-10 ENCOUNTER — Other Ambulatory Visit: Payer: Self-pay | Admitting: Obstetrics and Gynecology

## 2015-12-10 DIAGNOSIS — N63 Unspecified lump in breast: Secondary | ICD-10-CM | POA: Diagnosis not present

## 2015-12-10 DIAGNOSIS — R928 Other abnormal and inconclusive findings on diagnostic imaging of breast: Secondary | ICD-10-CM

## 2015-12-10 DIAGNOSIS — IMO0002 Reserved for concepts with insufficient information to code with codable children: Secondary | ICD-10-CM

## 2015-12-10 DIAGNOSIS — R921 Mammographic calcification found on diagnostic imaging of breast: Secondary | ICD-10-CM

## 2015-12-10 DIAGNOSIS — R229 Localized swelling, mass and lump, unspecified: Principal | ICD-10-CM

## 2015-12-10 DIAGNOSIS — Z23 Encounter for immunization: Secondary | ICD-10-CM | POA: Diagnosis not present

## 2015-12-15 ENCOUNTER — Other Ambulatory Visit: Payer: Self-pay | Admitting: Obstetrics and Gynecology

## 2015-12-15 DIAGNOSIS — R229 Localized swelling, mass and lump, unspecified: Principal | ICD-10-CM

## 2015-12-15 DIAGNOSIS — IMO0002 Reserved for concepts with insufficient information to code with codable children: Secondary | ICD-10-CM

## 2015-12-16 ENCOUNTER — Other Ambulatory Visit: Payer: Self-pay | Admitting: Obstetrics and Gynecology

## 2015-12-16 ENCOUNTER — Ambulatory Visit
Admission: RE | Admit: 2015-12-16 | Discharge: 2015-12-16 | Disposition: A | Discharge status: Home / Self Care | Payer: BLUE CROSS/BLUE SHIELD | Source: Ambulatory Visit | Attending: Obstetrics and Gynecology | Admitting: Obstetrics and Gynecology

## 2015-12-16 ENCOUNTER — Ambulatory Visit: Admission: RE | Admit: 2015-12-16 | Payer: BLUE CROSS/BLUE SHIELD | Source: Ambulatory Visit

## 2015-12-16 DIAGNOSIS — R921 Mammographic calcification found on diagnostic imaging of breast: Secondary | ICD-10-CM

## 2015-12-16 DIAGNOSIS — N62 Hypertrophy of breast: Secondary | ICD-10-CM | POA: Diagnosis not present

## 2015-12-16 DIAGNOSIS — IMO0002 Reserved for concepts with insufficient information to code with codable children: Secondary | ICD-10-CM

## 2015-12-16 DIAGNOSIS — R229 Localized swelling, mass and lump, unspecified: Principal | ICD-10-CM

## 2015-12-16 DIAGNOSIS — N6012 Diffuse cystic mastopathy of left breast: Secondary | ICD-10-CM | POA: Diagnosis not present

## 2015-12-16 DIAGNOSIS — N6489 Other specified disorders of breast: Secondary | ICD-10-CM | POA: Diagnosis not present

## 2015-12-16 DIAGNOSIS — N6320 Unspecified lump in the left breast, unspecified quadrant: Secondary | ICD-10-CM

## 2016-01-01 DIAGNOSIS — N6092 Unspecified benign mammary dysplasia of left breast: Secondary | ICD-10-CM | POA: Diagnosis not present

## 2016-01-06 ENCOUNTER — Telehealth: Payer: Self-pay | Admitting: Genetic Counselor

## 2016-01-06 NOTE — Telephone Encounter (Signed)
Received a call from [facility] regarding scheduling an urgent genetics appt.  Confirmed new fax number and they will resend referral. Pt confirmed appt, completed intake, mailed pt letter, fax referring provider appt date/time

## 2016-01-07 ENCOUNTER — Encounter: Payer: Self-pay | Admitting: Genetic Counselor

## 2016-01-11 ENCOUNTER — Other Ambulatory Visit: Payer: BLUE CROSS/BLUE SHIELD

## 2016-01-11 ENCOUNTER — Ambulatory Visit (HOSPITAL_BASED_OUTPATIENT_CLINIC_OR_DEPARTMENT_OTHER): Payer: BLUE CROSS/BLUE SHIELD | Admitting: Genetic Counselor

## 2016-01-11 ENCOUNTER — Encounter: Payer: Self-pay | Admitting: Genetic Counselor

## 2016-01-11 DIAGNOSIS — Z8041 Family history of malignant neoplasm of ovary: Secondary | ICD-10-CM

## 2016-01-11 DIAGNOSIS — Z803 Family history of malignant neoplasm of breast: Secondary | ICD-10-CM | POA: Diagnosis not present

## 2016-01-11 DIAGNOSIS — Z808 Family history of malignant neoplasm of other organs or systems: Secondary | ICD-10-CM

## 2016-01-11 DIAGNOSIS — Z315 Encounter for genetic counseling: Secondary | ICD-10-CM

## 2016-01-11 NOTE — Progress Notes (Signed)
REFERRING PROVIDER: Haywood Pao, MD [place], [place] [address]   Autumn Messing, MD  PRIMARY PROVIDER:  Haywood Pao, MD  PRIMARY REASON FOR VISIT:  1. Family history of breast cancer   2. Family history of ovarian cancer   3. Family history of melanoma      HISTORY OF PRESENT ILLNESS:   Ms. Earp, a 45 y.o. female, was seen for a [facility] cancer genetics consultation at the request of Dr. Osborne Casco due to a family history of cancer.  Ms. Alcoser presents to clinic today to discuss the possibility of a hereditary predisposition to cancer, genetic testing, and to further clarify her future cancer risks, as well as potential cancer risks for family members. Ms. Mangen is a 45 y.o. female with no personal history of cancer.  She was diagnosed with lobular hyperplasia and is planning on having a single mastectomy based on this high risk lesion.  CANCER HISTORY:   No history exists.     HORMONAL RISK FACTORS:  Menarche was at age 89.  First live birth at age 32.  OCP use for approximately 5-10 years.  Ovaries intact: yes.  Hysterectomy: no.  Menopausal status: premenopausal.  HRT use: 0 years. Colonoscopy: no; not examined. Mammogram within the last year: yes. Number of breast biopsies: 1. Up to date with pelvic exams:  yes. Any excessive radiation exposure in the past:  no  Past Medical History:  Diagnosis Date  . Family history of breast cancer   . Family history of melanoma   . Family history of ovarian cancer   . IBS (irritable bowel syndrome)   . Migraine   . Ovarian cyst     Past Surgical History:  Procedure Laterality Date  . CESAREAN SECTION     10/09/03    Social History   Social History  . Marital status: Married    Spouse name: N/A  . Number of children: 3  . Years of education: N/A   Social History Main Topics  . Smoking status: Current Every Day Smoker    Packs/day: 0.50    Types: Cigarettes  . Smokeless tobacco: Never Used   . Alcohol use Yes     Comment: occ  . Drug use: No  . Sexual activity: Yes    Birth control/ protection: IUD   Other Topics Concern  . None   Social History Narrative  . None     FAMILY HISTORY:  We obtained a detailed, 4-generation family history.  Significant diagnoses are listed below: Family History  Problem Relation Age of Onset  . Breast cancer Mother 63  . Lung cancer Father 33  . Lung cancer Sister     dx in her 78s  . Breast cancer Maternal Grandmother   . Ovarian cancer Maternal Grandmother   . Breast cancer Maternal Aunt     mother's maternal 1/2 sister  . Cancer Paternal Grandfather     NOS  . Melanoma Maternal Aunt     mother's maternal half sister    The patient has three sons who are cancer free.  She has two sisters and three brothers.  One brother was murdered at 26, and one sister developed breast cancer in her 51's.  She has not had genetic testing.  The patent's mother had breast cancer and died at 70.  Her father died of lung cancer at 65.  The patient's mother had maternal half siblings, two brothers and two sisters.  One sister had  breast cancer and the other sister had melanoma.  The patient's maternal grandmother had breast cancer and ovarian cancer.  The patient's father had one brother.  His father had a cancer unspecified.  Patient's maternal ancestors are of Caucasian descent, and paternal ancestors are of [place] descent. There is no reported Ashkenazi Jewish ancestry. There is no known consanguinity.  GENETIC COUNSELING ASSESSMENT: ELEN ACERO is a 45 y.o. female with a family history of breast and ovarian cancer which is somewhat suggestive of a hereditary cancer syndrome and predisposition to cancer. We, therefore, discussed and recommended the following at today's visit.   DISCUSSION: We discussed that about 5-10% of breast cancer is hereditary with most cases due to BRCA mutations.  There are other genes associated with an increased risk for  breast cancer including PALB2, ATM and CHEK2.  We reviewed the characteristics, features and inheritance patterns of hereditary cancer syndromes. We also discussed genetic testing, including the appropriate family members to test, the process of testing, insurance coverage and turn-around-time for results. We discussed the implications of a negative, positive and/or variant of uncertain significant result. We recommended Ms. Bartram pursue genetic testing for the Breast/Ovarian cancer gene panel. The Breast/Ovarian gene panel offered by [facility] includes sequencing and rearrangement analysis for the following 20 genes:  ATM, BARD1, BRCA1, BRCA2, BRIP1, CDH1, CHEK2, EPCAM, FANCC, MLH1, MSH2, MSH6, NBN, PALB2, PMS2, PTEN, RAD51C, RAD51D, TP53, and XRCC2.     Based on Ms. Sauber's family history of cancer, she meets medical criteria for genetic testing. Despite that she meets criteria, she may still have an out of pocket cost. We discussed that if her out of pocket cost for testing is over $100, the laboratory will call and confirm whether she wants to proceed with testing.  If the out of pocket cost of testing is less than $100 she will be billed by the genetic testing laboratory.   In order to estimate her chance of having a BRCA mutation, we used statistical models ([employer] and [employer]) and laboratory data that take into account her personal medical history, family history and ancestry.  Because each model is different, there can be a lot of variability in the risks they give.  Therefore, these numbers must be considered a rough range and not a precise risk of having a BRCA mutation.  These models estimate that she has approximately a 13-16% chance of having a mutation. Based on this assessment of her family and personal history, genetic testing is recommended.  Based on the patient's personal and family history, statistical models ([place] [place] and [place])  and literature data were used to estimate her  risk of developing breast cancer. These estimate her lifetime risk of developing breast cancer to be approximately 35.6% to 58.5%. This estimation does not take into account any genetic testing results.  The patient's lifetime breast cancer risk is a preliminary estimate based on available information using one of several models endorsed by the [place] (ACS). The ACS recommends consideration of breast MRI screening as an adjunct to mammography for patients at high risk (defined as 20% or greater lifetime risk). A more detailed breast cancer risk assessment can be considered, if clinically indicated.   Ms. Montenegro has been determined to be at high risk for breast cancer.  Therefore, we recommend that annual screening with mammography and breast MRI begin at age 32, or 10 years prior to the age of breast cancer diagnosis in a relative (whichever is earlier).  We discussed that Ms. Ernandez should discuss her individual situation with her referring physician and determine a breast cancer screening plan with which they are both comfortable.    PLAN: After considering the risks, benefits, and limitations, Ms. Dellis  provided informed consent to pursue genetic testing and the blood sample was sent to [facility] for analysis of the Breast/Ovarian cancer gene panel. Results should be available within approximately 2-3 weeks' time, at which point they will be disclosed by telephone to Ms. Gorter, as will any additional recommendations warranted by these results. Ms. Cuneo will receive a summary of her genetic counseling visit and a copy of her results once available. This information will also be available in Epic. We encouraged Ms. Lagace to remain in contact with cancer genetics annually so that we can continuously update the family history and inform her of any changes in cancer genetics and testing that may be of benefit for her family. Ms. Leonardo questions were answered to her satisfaction  today. Our contact information was provided should additional questions or concerns arise.  Based on Ms. Scherman's family history, we recommended her sister, who was diagnosed with breast cancer at age 52, have genetic counseling and testing. Ms. Murdy will let us know if we can be of any assistance in coordinating genetic counseling and/or testing for this family member.   Lastly, we encouraged Ms. Brannick to remain in contact with cancer genetics annually so that we can continuously update the family history and inform her of any changes in cancer genetics and testing that may be of benefit for this family.   Ms.  Younker questions were answered to her satisfaction today. Our contact information was provided should additional questions or concerns arise. Thank you for the referral and allowing [place] to share in the care of your patient.   Karen P. Florene Glen, [place], [facility] Certified Genetic Counselor [email or web address] phone: [phone]  The patient was seen for a total of 45 minutes in face-to-face genetic counseling.  This patient was discussed with Drs. Magrinat, Lindi Adie and/or Burr Medico who agrees with the above.    _______________________________________________________________________ For Office Staff:  Number of people involved in session: 1 Was an Intern/ student involved with case: yes Lady Deutscher

## 2016-01-13 ENCOUNTER — Telehealth: Payer: Self-pay | Admitting: Hematology

## 2016-01-13 ENCOUNTER — Encounter: Payer: Self-pay | Admitting: Hematology

## 2016-01-13 NOTE — Telephone Encounter (Signed)
Appt schedule w/Feng on 10/31 at 11am. Pt agreed to the appt date and time. Demographics verified. Letter mailed to the pt.

## 2016-01-19 ENCOUNTER — Encounter: Payer: Self-pay | Admitting: Hematology

## 2016-01-19 ENCOUNTER — Ambulatory Visit (HOSPITAL_BASED_OUTPATIENT_CLINIC_OR_DEPARTMENT_OTHER): Payer: BLUE CROSS/BLUE SHIELD | Admitting: Hematology

## 2016-01-19 DIAGNOSIS — N62 Hypertrophy of breast: Secondary | ICD-10-CM

## 2016-01-19 DIAGNOSIS — Z800 Family history of malignant neoplasm of digestive organs: Secondary | ICD-10-CM | POA: Diagnosis not present

## 2016-01-19 DIAGNOSIS — Z803 Family history of malignant neoplasm of breast: Secondary | ICD-10-CM | POA: Diagnosis not present

## 2016-01-19 DIAGNOSIS — N6092 Unspecified benign mammary dysplasia of left breast: Secondary | ICD-10-CM

## 2016-01-19 NOTE — Progress Notes (Signed)
[place]  Telephone:([phone]) [phone] Fax:([phone]) [place] Note   Patient Care Team: Haywood Pao, MD as PCP - General (Internal Medicine) Molli Posey, MD as Consulting Physician (Obstetrics and Gynecology) Autumn Messing III, MD as Consulting Physician (General Surgery) 01/19/2016    REFERRAL PHYSICIAN: Dr. Marlou Starks   CHIEF COMPLAINTS/PURPOSE OF CONSULTATION:  Left breast ALH   HISTORY OF PRESENTING ILLNESS:  Lynn Simpson 45 y.o. female is here because of referral by surgeon, Dr. Marlou Starks, for recently diagnosed left breast ALH. She has a strong family history of breast cancer. Her maternal grandmother also had ovarian cancer.  She experienced left breast pain over the past several years attributing this to the under-wire of her bra. She received annual mammograms so she was not concerned about this pain. Screening mammogram and left breast ultrasound on 9/21/217 showed indeterminate 4 mm nodule in the left breast at the 6:30 o'clock position. Also seen were two groups of indeterminate calcifications in the lower slightly outer quadrant and in the lower inner quadrant of the left breast.   12/16/2015 biopsy of the slight lateral lower outer quadrant showed fibrocystic changes with adenosis and calcification with pseudoangiomatous stromal hyperplasia and no evidence of malignancy. Biopsy of the left breast mass showed lobular neoplasia (atypical lobular hyperplasia) involving a complex sclerosing lesion.   She met with genetic counseling on 01/11/2016 and had genetic testing, the results still pending.  She smokes half a pack per day. This has been off and on. She restarted smoking after her recent diagnosis of breast cancer due to stress.   She is doing well after her breast biopsy. She has no concerns or complaints at this time.    GYN HISTORY  Menarchal: 45 years of age LMP: one year ago; pre-menopausal Contraceptive: IUD place one year ago HRT:  n/a  GP: 3 pregnancies and 3 births  Mother died at 68 yo of gastrointestinal. She had breast cancer. Sister breast cancer diagnosed in her 34s Maternal aunt breast cancer diagnosed in her 13s. Early stage. Maternal grandmother had breast cancer and ovarian cancer   MEDICAL HISTORY:  Past Medical History:  Diagnosis Date  . Family history of breast cancer   . Family history of melanoma   . Family history of ovarian cancer   . IBS (irritable bowel syndrome)   . Migraine   . Ovarian cyst     SURGICAL HISTORY: Past Surgical History:  Procedure Laterality Date  . CESAREAN SECTION     10/09/03  . CESAREAN SECTION  2005    SOCIAL HISTORY: Social History   Social History  . Marital status: Married    Spouse name: N/A  . Number of children: 3  . Years of education: N/A   Occupational History  . Not on file.   Social History Main Topics  . Smoking status: Current Every Day Smoker    Packs/day: 0.50    Years: 20.00    Types: Cigarettes  . Smokeless tobacco: Never Used  . Alcohol use Yes     Comment: occ  . Drug use: No  . Sexual activity: Yes    Birth control/ protection: IUD   Other Topics Concern  . Not on file   Social History Narrative  . No narrative on file    FAMILY HISTORY: Family History  Problem Relation Age of Onset  . Breast cancer Mother 78  . Lung cancer Father 40  . Breast cancer Sister 86  breast cancer   . Breast cancer Maternal Grandmother   . Ovarian cancer Maternal Grandmother   . Breast cancer Maternal Aunt     mother's maternal 1/2 sister  . Cancer Paternal Grandfather     NOS  . Melanoma Maternal Aunt     mother's maternal half sister    ALLERGIES:  is allergic to bee venom and wellbutrin [bupropion].  MEDICATIONS:  Current Outpatient Prescriptions  Medication Sig Dispense Refill  . clonazePAM (KLONOPIN) 0.5 MG tablet Take 0.5 mg by mouth 2 (two) times daily.  0  . Sertraline HCl (ZOLOFT PO) Take by mouth.    . naproxen  (NAPROSYN) 500 MG tablet Take 1 tablet (500 mg total) by mouth 2 (two) times daily. (Patient not taking: Reported on 01/19/2016) 30 tablet 0   No current facility-administered medications for this visit.     REVIEW OF SYSTEMS:   Constitutional: Denies fevers, chills or abnormal night sweats Eyes: Denies blurriness of vision, double vision or watery eyes Ears, nose, mouth, throat, and face: Denies mucositis or sore throat Respiratory: Denies cough, dyspnea or wheezes Cardiovascular: Denies palpitation, chest discomfort or lower extremity swelling Gastrointestinal:  Denies nausea, heartburn or change in bowel habits Skin: Denies abnormal skin rashes Lymphatics: Denies new lymphadenopathy or easy bruising Neurological:Denies numbness, tingling or new weaknesses Behavioral/Psych: Mood is stable, no new changes  All other systems were reviewed with the patient and are negative.  PHYSICAL EXAMINATION: ECOG PERFORMANCE STATUS: 0 - Asymptomatic  Vitals:   01/19/16 1152  BP: (!) 149/73  Pulse: 77  Resp: 18  Temp: 98.3 F (36.8 C)   Filed Weights   01/19/16 1152  Weight: 246 lb 3.2 oz (111.7 kg)    GENERAL:alert, no distress and comfortable SKIN: skin color, texture, turgor are normal, no rashes or significant lesions EYES: normal, conjunctiva are pink and non-injected, sclera clear OROPHARYNX:no exudate, no erythema and lips, buccal mucosa, and tongue normal  NECK: supple, thyroid normal size, non-tender, without nodularity LYMPH:  no palpable lymphadenopathy in the cervical, axillary or inguinal LUNGS: clear to auscultation and percussion with normal breathing effort HEART: regular rate & rhythm and no murmurs and no lower extremity edema ABDOMEN:abdomen soft, non-tender and normal bowel sounds Musculoskeletal:no cyanosis of digits and no clubbing  PSYCH: alert & oriented x 3 with fluent speech NEURO: no focal motor/sensory deficits  Breasts: Breast inspection showed them to be  symmetrical with no nipple discharge. Palpation of the breasts and axilla revealed no obvious mass that I could appreciate.  LABORATORY DATA:  I have reviewed the data as listed CBC Latest Ref Rng & Units 09/30/2015 09/12/2009 10/01/2007  WBC 4.0 - 10.5 K/uL 8.1 13.5(H) 5.1  Hemoglobin 12.0 - 15.0 g/dL 14.6 14.0 14.5  Hematocrit 36.0 - 46.0 % 42.4 39.7 41.8  Platelets 150 - 400 K/uL 194 138(L) 136(L)   CMP Latest Ref Rng & Units 09/30/2015 09/12/2009 10/01/2007  Glucose 65 - 99 mg/dL 86 89 80  BUN 6 - 20 mg/dL _0 Creatinine 0.44 - 1.00 mg/dL 0.63 0.74 0.68  Sodium 135 - 145 mmol/L 136 139 137  Potassium 3.5 - 5.1 mmol/L 4.0 3.8 3.8  Chloride 101 - 111 mmol/L 104 106 103  CO2 22 - 32 mmol/L _1 Calcium 8.9 - 10.3 mg/dL 9.2 9.0 9.1  Total Protein 6.5 - 8.1 g/dL 7.2 6.5 6.6  Total Bilirubin 0.3 - 1.2 mg/dL 0.5 0.7 0.8  Alkaline Phos 38 - 126 U/L 98 62  72  AST 15 - 41 U/L _0 ALT 14 - 54 U/L _1 PATHOLOGY REPORT  Diagnosis 12/16/2015 1. Breast, left, needle core biopsy, slight lateral lower outer quadrant FIBROCYSTIC CHANCES WITH ADENOSIS AND CALCIFICATION PSEUDOANGIOMATOUS STROMAL HYPERPLASIA NO EVIDENCE OF MALIGNANCY 2. Breast, left, needle core biopsy, 5:30 o'clock posterior LOBULAR NEOPLASIA (ATYPICAL LOBULAR HYPERPLASIA) INVOLVING A COMPLEX SCLEROSING LESION Microscopic Comment 1. This case also reviewed by Dr. Lyndon Code and agree.  RADIOGRAPHIC STUDIES: I have personally reviewed the radiological images as listed and agreed with the findings in the report.  MM DIAG BREAST TOMO UNI LEFT 12/10/2015 Left breast 630 o'clock indeterminate 4 mm nodule, for which ultrasound-guided core needle biopsy is recommended.  Two groups of indeterminate calcifications in the lower slightly outer quadrant and in the lower inner quadrant of the left breast. Stereotactic core needle biopsy is recommended for the slightly lower outer quadrant group of calcifications, as these  may be slightly more accessible for stereotactic biopsy. If pathology results are benign, six-month follow-up is recommended for the second group of calcifications. If pathology results demonstrate malignancy or high risk lesion, then stereotactic core needle biopsy of this second group of calcifications is recommended.  RECOMMENDATION: Ultrasound-guided and stereotactic guided core needle biopsy of the left breast.   ASSESSMENT & PLAN:  45 year old premenopausal Caucasian female  #1 left breast atypical lobular hyperplasia -I discussed her imaging findings and left breast biopsy  pathology results with her in great details. -We reviewed the natural history of atypical hyperplasia. It is consider a benign breast disease, however it does increase the risk of breast cancer by 3-5 fold. It is considered as a high risk for breast cancer. -We discussed that biopsy has sampling error, and up to  20% [facility] breast lesion may find malignancy on final surgical path showed surgical resection is usually recommended. -Due to her strong family history of breast and ovarian cancer, she is leaning towards bilateral mastectomy. We're waiting for the genetic testing results to make further surgical recommendations. -If she has left breast lumpectomy or mastectomy, then she is at increased risk for breast cancer in the future. And we discussed risk reduction strategies. -We discussed annual screening mammogram, which will detect early stage breast cancer. She agrees to continue. She is very compliant on screening -We also discussed healthy diet and regular exercise, calcium and vitamin D supplement, to reduce her risk of breast cancer -She is currently on hormonal replacement, which can potentially increase the risk of breast cancer. I recommend her to stop. She is a currently tapering it off and agreed to stop. -We further discussed chemoprevention to breast cancer. I discussed the option of tamoxifen and  raloxifene. These endocrine therapy agent is likely going to reduce the risk of breast cancer by 30-40%, however there is no data of survival benefit so far.  -The different side effects of these two agents were discussed with patient in great details. Written material were provided to her.  -She is interested in chemoprevention.  -We also discussed the role of adjuvant radiation to reduce the risk of left breast cancer. She is scheduled to see radiation oncologist Dr. Lisbeth Renshaw next week. -If She has bilateral mastectomy, then no routine screening mammogram or chemoprevention is necessary. -She met with genetic counseling on 01/11/2016. -She is interested in bilateral mastectomies if her genetic testing comes back positive. If this comes back negative, she is interested in breast conservation and chemoprevention    #2 Genetics  -  She has strong family history of breast and colon cancer  -She underwent genetic testing for inheritable breast and ovary syndrome, results still pending at this point.   Follow-up: I plan to see her back after surgery to finalize chemoprevention and cancer surveillance. If she decides to have bilateral mastectomy, then she will follow-up with her primary care physician only.   This document serves as a record of services personally performed by Truitt Merle, MD. It was created on her behalf by Arlyce Harman, a trained medical scribe. The creation of this record is based on the scribe's personal observations and the provider's statements to them. This document has been checked and approved by the attending provider.  Truitt Merle  01/19/2016

## 2016-01-19 NOTE — Progress Notes (Deleted)
[facility]  [phone] Fax:([phone] [place] Note   Patient Care Team: Haywood Pao, MD as PCP - General (Internal Medicine) 01/19/2016  CHIEF COMPLAINTS/PURPOSE OF CONSULTATION:  ***  HISTORY OF PRESENTING ILLNESS:  Lynn Simpson 45 y.o. female is here because of ***  MEDICAL HISTORY:  Past Medical History:  Diagnosis Date  . Family history of breast cancer   . Family history of melanoma   . Family history of ovarian cancer   . IBS (irritable bowel syndrome)   . Migraine   . Ovarian cyst     SURGICAL HISTORY: Past Surgical History:  Procedure Laterality Date  . CESAREAN SECTION     10/09/03    SOCIAL HISTORY: Social History   Social History  . Marital status: Married    Spouse name: N/A  . Number of children: 3  . Years of education: N/A   Occupational History  . Not on file.   Social History Main Topics  . Smoking status: Current Every Day Smoker    Packs/day: 0.50    Types: Cigarettes  . Smokeless tobacco: Never Used  . Alcohol use Yes     Comment: occ  . Drug use: No  . Sexual activity: Yes    Birth control/ protection: IUD   Other Topics Concern  . Not on file   Social History Narrative  . No narrative on file    FAMILY HISTORY: Family History  Problem Relation Age of Onset  . Breast cancer Mother 34  . Lung cancer Father 33  . Lung cancer Sister     dx in her 45s  . Breast cancer Maternal Grandmother   . Ovarian cancer Maternal Grandmother   . Breast cancer Maternal Aunt     mother's maternal 1/2 sister  . Cancer Paternal Grandfather     NOS  . Melanoma Maternal Aunt     mother's maternal half sister    ALLERGIES:  is allergic to bee venom and wellbutrin [bupropion].  MEDICATIONS:  Current Outpatient Prescriptions  Medication Sig Dispense Refill  . naproxen (NAPROSYN) 500 MG tablet Take 1 tablet (500 mg total) by mouth 2 (two) times daily. 30 tablet 0  . Sertraline HCl  (ZOLOFT PO) Take by mouth.     No current facility-administered medications for this visit.     REVIEW OF SYSTEMS:   Constitutional: Denies fevers, chills or abnormal night sweats Eyes: Denies blurriness of vision, double vision or watery eyes Ears, nose, mouth, throat, and face: Denies mucositis or sore throat Respiratory: Denies cough, dyspnea or wheezes Cardiovascular: Denies palpitation, chest discomfort or lower extremity swelling Gastrointestinal:  Denies nausea, heartburn or change in bowel habits Skin: Denies abnormal skin rashes Lymphatics: Denies new lymphadenopathy or easy bruising Neurological:Denies numbness, tingling or new weaknesses Behavioral/Psych: Mood is stable, no new changes  All other systems were reviewed with the patient and are negative.  PHYSICAL EXAMINATION: ECOG PERFORMANCE STATUS: {CHL ONC ECOG PS:[phone]}  There were no vitals filed for this visit. There were no vitals filed for this visit.  GENERAL:alert, no distress and comfortable SKIN: skin color, texture, turgor are normal, no rashes or significant lesions EYES: normal, conjunctiva are pink and non-injected, sclera clear OROPHARYNX:no exudate, no erythema and lips, buccal mucosa, and tongue normal  NECK: supple, thyroid normal size, non-tender, without nodularity LYMPH:  no palpable lymphadenopathy in the cervical, axillary or inguinal LUNGS: clear to auscultation and percussion with normal breathing effort HEART: regular rate &  rhythm and no murmurs and no lower extremity edema ABDOMEN:abdomen soft, non-tender and normal bowel sounds Musculoskeletal:no cyanosis of digits and no clubbing  PSYCH: alert & oriented x 3 with fluent speech NEURO: no focal motor/sensory deficits  LABORATORY DATA:  I have reviewed the data as listed CBC Latest Ref Rng & Units 09/30/2015 09/12/2009 10/01/2007  WBC 4.0 - 10.5 K/uL 8.1 13.5(H) 5.1  Hemoglobin 12.0 - 15.0 g/dL 14.6 14.0 14.5  Hematocrit 36.0 - 46.0 %  42.4 39.7 41.8  Platelets 150 - 400 K/uL 194 138(L) 136(L)   CMP Latest Ref Rng & Units 09/30/2015 09/12/2009 10/01/2007  Glucose 65 - 99 mg/dL 86 89 80  BUN 6 - 20 mg/dL 9 9 7   Creatinine 0.44 - 1.00 mg/dL 0.63 0.74 0.68  Sodium 135 - 145 mmol/L 136 139 137  Potassium 3.5 - 5.1 mmol/L 4.0 3.8 3.8  Chloride 101 - 111 mmol/L 104 106 103  CO2 22 - 32 mmol/L 25 23 26   Calcium 8.9 - 10.3 mg/dL 9.2 9.0 9.1  Total Protein 6.5 - 8.1 g/dL 7.2 6.5 6.6  Total Bilirubin 0.3 - 1.2 mg/dL 0.5 0.7 0.8  Alkaline Phos 38 - 126 U/L 98 62 72  AST 15 - 41 U/L 17 22 22   ALT 14 - 54 U/L 18 21 16    PATHOLOGY REPORT  Diagnosis 12/16/2015 1. Breast, left, needle core biopsy, slight lateral lower outer quadrant FIBROCYSTIC CHANCES WITH ADENOSIS AND CALCIFICATION PSEUDOANGIOMATOUS STROMAL HYPERPLASIA NO EVIDENCE OF MALIGNANCY 2. Breast, left, needle core biopsy, 5:30 o'clock posterior LOBULAR NEOPLASIA (ATYPICAL LOBULAR HYPERPLASIA) INVOLVING A COMPLEX SCLEROSING LESION Microscopic Comment 1. This case also reviewed by Dr. Lyndon Code and agree.  RADIOGRAPHIC STUDIES: I have personally reviewed the radiological images as listed and agreed with the findings in the report.  MM DIAG BREAST TOMO UNI LEFT 12/10/2015 Left breast 630 o'clock indeterminate 4 mm nodule, for which ultrasound-guided core needle biopsy is recommended.  Two groups of indeterminate calcifications in the lower slightly outer quadrant and in the lower inner quadrant of the left breast. Stereotactic core needle biopsy is recommended for the slightly lower outer quadrant group of calcifications, as these may be slightly more accessible for stereotactic biopsy. If pathology results are benign, six-month follow-up is recommended for the second group of calcifications. If pathology results demonstrate malignancy or high risk lesion, then stereotactic core needle biopsy of this second group of calcifications is  recommended.  RECOMMENDATION: Ultrasound-guided and stereotactic guided core needle biopsy of the left breast.   ASSESSMENT & PLAN:  45 year old premenopausal Caucasian female  #1 left breast atypical lobular hyperplasia -I discussed her imaging findings and surgical pathology results with her in great details. -We reviewed the natural history of atypical hyperplasia. It is consider a benign breast disease, however it does increase the risk of breast cancer by 3-5 fold. It is considered as a high risk for breast cancer. -We discussed annual screening mammogram, which will detect early stage breast cancer. She agrees to continue. She is very compliant on screening -We also discussed healthy diet and regular exercise, calcium and vitamin D supplement, to reduce her risk of breast cancer -She is currently on hormonal replacement, which can potentially increase the risk of breast cancer. I recommend her to stop. She is a currently tapering it off and agreed to stop. -We further discussed chemoprevention to breast cancer. I discussed the option of tamoxifen, raloxifene and anastrozole. These endocrine therapy agent is likely going to reduce the risk of breast  cancer by 30-40%, however there is no data of survival benefit so far.  -The different side effects of this 3 agents were discussed with patient in great details. Written material were provided to her.  -She is interested in chemoprevention, however would like to discuss with her gynecologist when she see her in July.   #2 Bone health Her last DEXA scan was one year ago, which showed osteopenia. -She is on vitamins D and calcium supplement -I discussed tamoxifen and raloxifene can increased her bone density.  Follow-up: I plan to see her back in August, if she decides to take chemoprevention agent. Otherwise she will follow-up with her primary care physician.

## 2016-01-26 ENCOUNTER — Telehealth: Payer: Self-pay | Admitting: Genetic Counselor

## 2016-01-26 ENCOUNTER — Other Ambulatory Visit: Payer: BLUE CROSS/BLUE SHIELD

## 2016-01-26 NOTE — Telephone Encounter (Signed)
Spoke with patient today about her OOP cost.  She was called for the first time with information about her OOP cost.  It was over $5000 but they were able to get it down to $502.  I told her that we could offer OOP testing for $250.  I will try to cancel the testing and bring her in tomorrow for additional testing.  Emailed Roderic Palau to determine if she would owe anything based starting the testing and then canceling one day later.

## 2016-01-27 ENCOUNTER — Ambulatory Visit: Payer: BLUE CROSS/BLUE SHIELD

## 2016-01-29 NOTE — Telephone Encounter (Signed)
Patient came in and had her blood drawn for a 42 gene panel.  Results will take 2-3 weeks.

## 2016-02-02 DIAGNOSIS — D493 Neoplasm of unspecified behavior of breast: Secondary | ICD-10-CM | POA: Diagnosis not present

## 2016-02-02 DIAGNOSIS — N62 Hypertrophy of breast: Secondary | ICD-10-CM | POA: Diagnosis not present

## 2016-02-04 ENCOUNTER — Telehealth: Payer: Self-pay | Admitting: Genetic Counselor

## 2016-02-04 ENCOUNTER — Encounter: Payer: Self-pay | Admitting: Genetic Counselor

## 2016-02-04 DIAGNOSIS — Z1379 Encounter for other screening for genetic and chromosomal anomalies: Secondary | ICD-10-CM | POA: Insufficient documentation

## 2016-02-04 DIAGNOSIS — Z1509 Genetic susceptibility to other malignant neoplasm: Secondary | ICD-10-CM | POA: Insufficient documentation

## 2016-02-04 NOTE — Telephone Encounter (Signed)
Revealed that genetic testing found a PMS2 mutation confirming a diagnosis of Lynch syndrome.  We discussed that typically Lynch syndrome is considered to be a colon cancer syndrome, although some families, such as hers, is clearly more of a breast cancer syndrome.  There are no specific recommendations in Lynch syndrome guidelines for breast cancer screening or management.  Therefore, her decision on her breast surgery should be based on the finding and family history.  We will let Dr. Marlou Starks know of these results and will see the patient back on 02/17/2016.

## 2016-02-17 ENCOUNTER — Ambulatory Visit (HOSPITAL_BASED_OUTPATIENT_CLINIC_OR_DEPARTMENT_OTHER): Payer: BLUE CROSS/BLUE SHIELD | Admitting: Genetic Counselor

## 2016-02-17 ENCOUNTER — Encounter: Payer: Self-pay | Admitting: Genetic Counselor

## 2016-02-17 DIAGNOSIS — Z803 Family history of malignant neoplasm of breast: Secondary | ICD-10-CM | POA: Diagnosis not present

## 2016-02-17 DIAGNOSIS — Z808 Family history of malignant neoplasm of other organs or systems: Secondary | ICD-10-CM | POA: Diagnosis not present

## 2016-02-17 DIAGNOSIS — Z8041 Family history of malignant neoplasm of ovary: Secondary | ICD-10-CM | POA: Diagnosis not present

## 2016-02-17 DIAGNOSIS — Z15068 Genetic susceptibility to other malignant neoplasm of digestive system: Secondary | ICD-10-CM

## 2016-02-17 DIAGNOSIS — Z1379 Encounter for other screening for genetic and chromosomal anomalies: Secondary | ICD-10-CM

## 2016-02-17 DIAGNOSIS — Z1509 Genetic susceptibility to other malignant neoplasm: Secondary | ICD-10-CM

## 2016-02-17 DIAGNOSIS — Z800 Family history of malignant neoplasm of digestive organs: Secondary | ICD-10-CM | POA: Diagnosis not present

## 2016-02-17 DIAGNOSIS — Z315 Encounter for genetic counseling: Secondary | ICD-10-CM

## 2016-02-17 DIAGNOSIS — N6092 Unspecified benign mammary dysplasia of left breast: Secondary | ICD-10-CM

## 2016-02-17 NOTE — Progress Notes (Addendum)
GENETIC TEST RESULTS   Patient Name: Lynn Simpson Patient Age: 45 y.o. Encounter Date: 02/17/2016  Referring Provider: Truitt Merle, MD  HPI: Lynn Simpson was previously seen in the [facility] clinic due to a family of cancer and concerns regarding a hereditary predisposition to cancer. Please refer to our prior cancer genetics clinic note for more information regarding Lynn Simpson's medical, social and family histories, and our assessment and recommendations, at the time. Lynn Simpson recent genetic test results were disclosed to her, as were recommendations warranted by these results. These results and recommendations are discussed in more detail below.   FAMILY HISTORY:  We obtained a detailed, 4-generation family history.  Significant diagnoses are listed below: Family History  Problem Relation Age of Onset  . Breast cancer Mother 25  . Lung cancer Father 91  . Breast cancer Sister 8    breast cancer   . Breast cancer Maternal Grandmother   . Ovarian cancer Maternal Grandmother   . Colon cancer Maternal Grandmother   . Breast cancer Maternal Aunt     mother's maternal 1/2 sister  . Cancer Paternal Grandfather     NOS  . Melanoma Maternal Aunt     mother's maternal half sister    The patient has three sons who are cancer free.  She has two sisters and three brothers.  One brother was murdered at 8, and one sister developed breast cancer in her 15's.  She has not had genetic testing.  The patent's mother had breast cancer and died at 4.  Her father died of lung cancer at 43.  The patient's mother had maternal half siblings, two brothers and two sisters.  One sister had breast cancer and the other sister had melanoma.  The patient's maternal grandmother had breast, colon, and ovarian cancer.  The patient's father had one brother.  His father had a cancer unspecified.  Patient's maternal ancestors are of Caucasian descent, and paternal ancestors are of [place] descent.  There is no reported Ashkenazi Jewish ancestry. There is no known consanguinity.  GENETIC TESTING:  At the time of Lynn Simpson's visit, we recommended she pursue genetic testing of the common hereditary cancer test. The genetic testing February 04, 2016 through the common hereditary Cancer Panel offered by [facility] identified a single, heterozygous pathogenic gene mutation called PMS2, [id number] confirming the diagnosis of Lynch syndrome.There were no deleterious mutations in APC, ATM, AXIN2, BARD1, BMPR1A, BRCA1, BRCA2, BRIP1, CDH1, CDKN2A, CHEK2, DICER1, EPCAM, GREM1, KIT, MEN1, MLH1, MSH2, MSH6, MUTYH, NBN, NF1, PALB2, PDGFRA, POLD1, POLE, PTEN, RAD50, RAD51C, RAD51D, SDHA, SDHB, SDHC, SDHD, SMAD4, SMARCA4. STK11, TP53, TSC1, TSC2, and VHL.     A copy of the test report has been scanned into Epic for review.   DISCUSSION:  Lynn Simpson discussed telling her family over Thanksgiving about her genetic testing and their reactions.  She felt that they were angry at her for doing the testing, that they felt that testing was a money making endeavor, and that she felt guilty that she did testing and that she does not have cancer.  I explained that all of these reactions are very normal.  When testing identifies a hereditary mutation, it can cause concern and fear for the risk of cancer and that they may have passed it onto the next generation.  We discussed that this mutation was something she was born with, and therefore she did not "do" this to anyone. While she was the one who underwent testing,  this was something that has been in the family for generations.  It is normal and natural to be fearful of this information and to feel angry at the unfairness.  However, over time, these feelings should change.  Additionally, survivor guilt is a common feeling.  Lynn Simpson may feel guilty that she has not developed cancer and that she can do something to prevent this.  Other family members, should they test negative, may  feel guilty for testing negative.    Lastly, we discussed the cost of treating cancer vs doing preventive measures to keep from getting cancer.  There are several costs for cancer including emotional costs, financial toxicity and loss of life. Prevention, or surveillance that identifies cancer early when it is more treatable, helps defray the costs of cancer.  We tried putting these discussions into perspective so that she can more easily discuss this with her family members.  SCREENING RECOMMENDATIONS: We discussed the implications of Lynch syndrome for Lynn Simpson, and discussed who else in the family should have genetic testing. We recommended Lynn Simpson follow management guidelines for Lynch syndrome; all of which are outlined below. These can be coordinated by Lynn Simpson's GI doctor or her primary provider.   1. Annual colonoscopy.   2. While there is no clear evidence to support screening for stomach and small bowel cancer, an upper endoscopy can be considered at 3-5 year intervals beginning at age 9-35. However, whether to have this screening is best determined by the gastroenterologist.   3. Annual urinalysis.   Lynn Simpson does not have a gastroenterologist.  We discussed that there are many groups in town that are knowledgeable about Lynch syndrome and could provide appropriate screening.  We discussed that the [place] group attends the [facility] tumor boards and participates in the GI working group meetings and therefore is very active with the cancer center and our patients.  She would like to set up an appointment to be followed by this group.    For women with Lynch syndrome, unlike the effective surveillance plan for colorectal cancer risk, there is no professional agreement regarding management for the increased risk of uterine and ovarian cancer. However, we are available to help women and their providers establish an individualized surveillance plan. It is also important for women to  understand the following:   1. Women should seek medical attention if they experience abnormal vaginal bleeding.  2. Some providers may still recommend vaginal ultrasounds, uterine biopsies (for uterine cancer risk) and/or CA-125 analysis ( for ovarian cancer risk), even though these have not been shown to be effective.  3. A hysterectomy with removal of the ovaries and fallopian tubes should be considered once childbearing is completed (if planned).  FAMILY MEMBERS: Since we now know the mutation in Ms. Kahl, we can test at-risk relatives to determine whether or not they have inherited the mutation and are at increased risk for cancer.   Ms. Sokoloski children are have a 50% chance to have inherited this mutation.  Two of her three children could undergo genetic testing.  We would be happy to facilitate testing through our site or help identify a genetic counselor near her son who lives in [place].  One son is relatively young and this will not be of any consequence to him for several years. We do not test children because there is no risk to them until they are adults. We recommend they have genetic counseling and testing by the time they are in  their early 20's.    Ms. Vu siblings have a 50% chance to have inherited this mutation. We recommend they have genetic testing for this same mutation, as identifying the presence of this mutation would allow them to also take advantage of risk-reducing measures.   Testing was performed through [employer].  First degree family members can get tested for free through [facility] if it is performed within 90 days of Ms. Owensby's test result.  SUPPORT AND RESOURCES: We discussed the online support group "Hereditary Colon Cancer Takes Guts" (www.hcctakesguts.com).  This group is made up of families with hereditary colon cancer syndromes, including those with Lynch syndrome.  We also provided information about the cancer registry, Vivia Ewing, out of [employer].  This group collects information about families who have hereditary cancer syndromes and can connect them with researchers who may be interested in their particular syndrome.  This group also provides a bi-annual newsletter updating about hereditary cancer syndromes.  To locate genetic counselors in other cities, visit the website of the [employer] (ArtistMovie.se) and Secretary/administrator for a Social worker by zip code.  We encouraged Ms. Sabado to remain in contact with [place] on an annual basis so we can update her personal and family histories, and let her know of advances in cancer genetics that may benefit the family. Our contact number was provided. Ms. Macapagal questions were answered to her satisfaction today, and she knows she is welcome to call anytime with additional questions.   Makyiah Lie P. Florene Glen, [place], [facility] Certified Genetic Counselor [email or web address] phone: [phone]

## 2016-02-20 DIAGNOSIS — F4323 Adjustment disorder with mixed anxiety and depressed mood: Secondary | ICD-10-CM | POA: Diagnosis not present

## 2016-02-22 DIAGNOSIS — Z8481 Family history of carrier of genetic disease: Secondary | ICD-10-CM | POA: Diagnosis not present

## 2016-02-24 ENCOUNTER — Ambulatory Visit (INDEPENDENT_AMBULATORY_CARE_PROVIDER_SITE_OTHER): Payer: BLUE CROSS/BLUE SHIELD | Admitting: Nurse Practitioner

## 2016-02-24 ENCOUNTER — Encounter: Payer: Self-pay | Admitting: Nurse Practitioner

## 2016-02-24 VITALS — BP 106/76 | HR 88 | Ht 62.0 in | Wt 249.6 lb

## 2016-02-24 DIAGNOSIS — K625 Hemorrhage of anus and rectum: Secondary | ICD-10-CM

## 2016-02-24 DIAGNOSIS — K219 Gastro-esophageal reflux disease without esophagitis: Secondary | ICD-10-CM

## 2016-02-24 DIAGNOSIS — Z1509 Genetic susceptibility to other malignant neoplasm: Secondary | ICD-10-CM

## 2016-02-24 MED ORDER — RANITIDINE HCL 75 MG PO TABS: 75.0000 mg | ORAL_TABLET | Freq: Every day | ORAL | 1 refills | Status: DC

## 2016-02-24 MED ORDER — NA SULFATE-K SULFATE-MG SULF 17.5-3.13-1.6 GM/177ML PO SOLN: 1.0000 | ORAL | 0 refills | Status: DC

## 2016-02-24 NOTE — Patient Instructions (Addendum)
You have been scheduled for a colonoscopy. Please follow written instructions given to you at your visit today.  Please pick up your prep supplies at the pharmacy within the next 1-3 days. If you use inhalers (even only as needed), please bring them with you on the day of your procedure. Your physician has requested that you go to www.startemmi.com and enter the access code given to you at your visit today. This web site gives a general overview about your procedure. However, you should still follow specific instructions given to you by our office regarding your preparation for the procedure.  We have sent the following medications to your pharmacy for you to pick up at your convenience: Zantac 75 mg at bedtime

## 2016-02-24 NOTE — Progress Notes (Signed)
HPI:  Patient is a 45 year old female recently diagnosed with high risk left breast lesion and has a strong family history of breast cancer.  She has chosen to have a bilateral mastectomy in January. Through genetic testing, patient diagnosed with Lynch syndrome. Patient gives a history of IBS with alternating constipation and diarrhea since age 55. She has chronic abdominal bloating and gas and chronic RLQ pain. She has chronic intermittent rectal bleeding with bowel movements. Initially this was associated with constipation but now occurs on a regular basis. Patient denies weight loss, she has in fact gained weight.  Patient has a long-standing history of heartburn/reflux problems. She had upper endoscopy with [facility] several years ago. Patient believes the endoscopy was unremarkable. She has heartburn frequently at night, but does not take anything for it. She has upper abdominal burning, especially in the morning. At one time patient was taking excessive amounts of NSAIDs but now takes no more than 1-2 times a month as needed for headaches.  Past Medical History:  Diagnosis Date  . Anxiety   . Family history of breast cancer   . Family history of melanoma   . Family history of ovarian cancer   . Gallstones   . IBS (irritable bowel syndrome)   . Migraine   . Obesity   . Ovarian cyst     Past Surgical History:  Procedure Laterality Date  . CESAREAN SECTION  2005   Family History  Problem Relation Age of Onset  . Breast cancer Mother 66  . Liver cancer Mother   . Lung cancer Father 72  . Breast cancer Sister 34  . Breast cancer Maternal Grandmother   . Ovarian cancer Maternal Grandmother   . Colon cancer Maternal Grandmother   . Cancer Paternal Grandfather     NOS  . Melanoma Maternal Aunt     mother's maternal half sister   Social History  Substance Use Topics  . Smoking status: Current Every Day Smoker    Packs/day: 0.50    Years: 20.00    Types: Cigarettes  .  Smokeless tobacco: Never Used     Comment: form given [id number]  . Alcohol use Yes     Comment: occ   Current Outpatient Prescriptions  Medication Sig Dispense Refill  . clonazePAM (KLONOPIN) 0.5 MG tablet Take 0.5 mg by mouth 2 (two) times daily.  0  . sertraline (ZOLOFT) 100 MG tablet Take 150 mg by mouth.     No current facility-administered medications for this visit.    Allergies  Allergen Reactions  . Bee Venom   . Wellbutrin [Bupropion]     Review of Systems: Positive for allergies, sinus problems, anxiety, night sweats, swollen legs and feet. All other systems reviewed and negative except where noted in HPI.   Physical Exam: BP 106/76   Pulse 88   Ht 5\' 2"  (1.575 m)   Wt 249 lb 9.6 oz (113.2 kg)   BMI 45.65 kg/m  Constitutional:  Obese white female in no acute distress. Psychiatric: Normal mood and affect. Behavior is normal. HEENT: Normocephalic and atraumatic. Conjunctivae are normal. No scleral icterus. Neck supple.  Cardiovascular: Normal rate, regular rhythm.  Pulmonary/chest: Effort normal and breath sounds normal. No wheezing, rales or rhonchi. Abdominal: Soft, nondistended, nontender. Bowel sounds active throughout. There are no masses palpable. No hepatomegaly. Extremities: no edema Lymphadenopathy: No cervical adenopathy noted. Neurological: Alert and oriented to person place and time. Skin: Skin is warm and dry. No  rashes noted.   ASSESSMENT AND PLAN:  60. 45 year old female with chronic, intermittent rectal bleeding with bowel movements. Probably anorectal source of bleeding but recent genetic testing done following diagnosis of high risk breast lesion revealed that patient has Lynch syndrome -Patient will be scheduled for a colonoscopy with possible polypectomy.  The risks and benefits of the procedure were discussed and the patient agrees to proceed.  -Since Lynch syndrome is usually associated with colon cancer I didn't schedule patient for EGD at at  this time. Owens Loffler, MD will be patient's Gastroenterologist and if he feels strongly about proceeding with an EGD then will add on an EGD to the colonoscopy  2. GERD / epigastric burning. Symptoms worse at night in the morning. She has significantly cut back on the large amounts of NSAIDs.  -Antireflux measures discussed and GERD literature given -Trial of Zantac 75 mg at bedtime -Judicial use of NSAIDs advised -If no improvement with above, consider EGD.   3. IBS, tends toward loose stools over the last year.   4. Chronic RLQ pain. Seen in ED for this in July and CTscan with contrast was unrevealing. Appendix normal. No bowel abnormalities seen. Musculoskeletal pain?     Tye Savoy NP  , 9:01 AM

## 2016-02-26 ENCOUNTER — Telehealth: Payer: Self-pay

## 2016-02-26 NOTE — Progress Notes (Signed)
Called patient back to tell her we wanted to add on an EGD. She will await Patty's call.

## 2016-02-26 NOTE — Progress Notes (Signed)
Lynn Simpson, she should have annual colonoscopy and an EGD every 2-3 years (lynch screening guidelines).  I'll alert Patty about changing her schedule for both colonoscopy and EGD.  Gelene Mink, Can you check on her upcoming colnoscopy. She needs EGD at same time.  May need to change dates if not an opening for another procedure that day.  Thanks  Dj

## 2016-02-26 NOTE — Telephone Encounter (Signed)
-----   Message from Milus Banister, MD sent at 02/26/2016  7:17 AM EST -----   ----- Message ----- From: Willia Craze, NP Sent: 02/25/2016   6:04 PM To: Milus Banister, MD  Jacqulyn Cane, the more I think about it I probably should have set her up for an EGD given new diagnosis of Lynch. Let me know what u think. I can find a different spot for a double if u want both. Thanks

## 2016-02-26 NOTE — Telephone Encounter (Signed)
Ermon Sagan, Can you check on her upcoming colnoscopy. She needs EGD at same time.  May need to change dates if not an opening for another procedure that day.  Thanks  Dj  Pt has been set up for endo colon same date, time change from 1130 to 11 am.

## 2016-03-01 ENCOUNTER — Ambulatory Visit: Payer: Self-pay | Admitting: General Surgery

## 2016-03-04 ENCOUNTER — Encounter (INDEPENDENT_AMBULATORY_CARE_PROVIDER_SITE_OTHER): Payer: Self-pay | Admitting: Sports Medicine

## 2016-03-04 ENCOUNTER — Ambulatory Visit (INDEPENDENT_AMBULATORY_CARE_PROVIDER_SITE_OTHER): Payer: BLUE CROSS/BLUE SHIELD | Admitting: Sports Medicine

## 2016-03-04 ENCOUNTER — Ambulatory Visit (INDEPENDENT_AMBULATORY_CARE_PROVIDER_SITE_OTHER): Payer: Self-pay

## 2016-03-04 VITALS — BP 119/78 | HR 74 | Ht 62.0 in | Wt 249.0 lb

## 2016-03-04 DIAGNOSIS — M7051 Other bursitis of knee, right knee: Secondary | ICD-10-CM | POA: Diagnosis not present

## 2016-03-04 DIAGNOSIS — M25571 Pain in right ankle and joints of right foot: Secondary | ICD-10-CM | POA: Diagnosis not present

## 2016-03-04 DIAGNOSIS — M7751 Other enthesopathy of right foot: Secondary | ICD-10-CM | POA: Diagnosis not present

## 2016-03-04 DIAGNOSIS — M25561 Pain in right knee: Secondary | ICD-10-CM

## 2016-03-04 DIAGNOSIS — M25871 Other specified joint disorders, right ankle and foot: Secondary | ICD-10-CM

## 2016-03-04 MED ORDER — DICLOFENAC SODIUM 2 % TD SOLN: 1.0000 "application " | Freq: Two times a day (BID) | TRANSDERMAL | 2 refills | Status: DC

## 2016-03-04 MED ORDER — BUPIVACAINE HCL 0.5 % IJ SOLN
1.0000 mL | INTRAMUSCULAR | Status: AC | PRN
  Administered 2016-03-04: 1 mL via INTRA_ARTICULAR

## 2016-03-04 MED ORDER — METHYLPREDNISOLONE ACETATE 40 MG/ML IJ SUSP
40.0000 mg | INTRAMUSCULAR | Status: AC | PRN
  Administered 2016-03-04: 40 mg via INTRA_ARTICULAR

## 2016-03-04 MED ORDER — LIDOCAINE HCL 1 % IJ SOLN
1.0000 mL | INTRAMUSCULAR | Status: AC | PRN
  Administered 2016-03-04: 1 mL

## 2016-03-04 NOTE — Patient Instructions (Signed)
I am transferring practices as of January 1st  to [facility] at [place].  This is a great opportunity & I am saddened to be leaving [facility] however & excited for new opportunities. I will continue to be seeing patients at [facility] through the end of December. I am happy to see you at the new location but also am confident that you are in great hands with the excellent providers here at [facility].  We are not currently scheduling patients at the new location at this time but if you look on [facility] website a contact information should be available there closer to January. Additionally [email or web address] will have information when it becomes available.    The telephone number will be [phone]  - Nobody will be answering this phone number until closer to January. 

## 2016-03-04 NOTE — Progress Notes (Signed)
Lynn Simpson - 45 y.o. female MRN [id number]  Date of birth: 1970-10-24  Office Visit Note: Visit Date: 03/04/2016 PCP: Haywood Pao, MD Referred by: Haywood Pao, MD  Subjective: Chief Complaint  Patient presents with  . Right Ankle - New Patient (Initial Visit)  . Right Knee - New Patient (Initial Visit)  . Follow-up    Patient states right ankle was sprained last April and has not healed.  States right ankle looks bigger than the left ankle.  Right knee pain before Thanksgiving,  hurts to walk and bend.  No swelling.     HPI: Patient reports 8 months of right ankle pain that has not improved significantly over the past several months. She has had pain with everyday activities. She has changed the way that she is typically walking due to this & is now reports some medial knee pain she associates with the abnormal gait. She has no significant clicking, popping or locking of the knee. Pain does not keep her awake at night either of these. She has tried over-the-counter anti-inflammatories but is cautious to take too many due to recent diagnosis of lung syndrome & propensity for GI bleeds. Additionally she has a scheduled upcoming bilateral mastectomy. She is tried some therapeutic exercises for the ankle & a lace up ankle brace but no compressive devices. ROS:. Otherwise per HPI.   Clinical History: No specialty comments available.  She reports that she has quit smoking. Her smoking use included Cigarettes. She has a 10.00 pack-year smoking history. She has never used smokeless tobacco.  No results for input(s): HGBA1C, LABURIC in the last 8760 hours.  Assessment & Plan: Visit Diagnoses:    ICD-9-CM ICD-10-CM   1. Pain in right ankle and joints of right foot 719.47 M25.571 XR Ankle 2 Views Right     Medium Joint Injection/Arthrocentesis  2. Acute pain of right knee 719.46 M25.561 XR Knee 1-2 Views Right     Medium Joint Injection/Arthrocentesis  3. Pes anserinus bursitis  of right knee 726.61 M70.51   4. Ankle impingement syndrome, right 726.79 M77.51     Plan: Long discussion today regarding options. Ultimately we will go ahead & inject her ankle as below. Recommend body helix compression sleeve to the ankle. Information for this was provided today. She should continue with [facility] foot & ankle conditioning program & therapeutic trial of Pennsaid for the has anserine bursitis. Follow-up: Return if symptoms worsen or fail to improve.  Meds:  Meds ordered this encounter  Medications  . Diclofenac Sodium (PENNSAID) 2 % SOLN    Sig: Place 1 application onto the skin 2 (two) times daily.    Dispense:  112 g    Refill:  2    Home Phone      [phone] Work Phone      [phone] Mobile          [phone]    Procedures: Medium Joint Inj Date/Time: 03/04/2016 10:40 AM Performed by: Teresa Coombs D Authorized by: Teresa Coombs D   Location:  Ankle Site:  R ankle Needle Size:  25 G Needle Length:  1.5 inches Ultrasound Guided: Yes   Fluoroscopic Guidance: No   Medications:  1 mL bupivacaine 0.5 %; 1 mL lidocaine 1 %; 40 mg methylPREDNISolone acetate 40 MG/ML Aspiration Attempted: No   Comments: The patient's clinical condition is marked by substantial pain and/or significant functional disability. Other conservative therapy has not provided relief, is contraindicated, or not appropriate. There is a  reasonable likelihood that injection will significantly improve the patient's pain and/or functional impairment.  After discussing the risks, benefits and expected outcomes of the injection and all questions were reviewed and answered, the patient wished to undergo the above named procedure.  Verbal consent was obtained. The target sight was prepped with alcohol scrub. Local anesthesia was obtained with ethyl chloride.  Under real-time ultrasound guidance, Injection of the target structure was performed using the above needle and medications under sterile technique.  Band-Aid was applied. The patient tolerated this procedure well with no immediate complications. Post injection instructions were provided.        Objective:  VS:  HT:5\' 2"  (157.5 cm)   WT:249 lb (112.9 kg)  BMI:45.6    BP:119/78  HR:74bpm  TEMP: ( )  RESP:  Physical Exam:  Adult female. Alert and appropriate.  In no acute distress.  Lower extremities are overall well aligned with no significant deformity. No significant swelling.  Distal pulses 2+/4. No significant bruising/ecchymosis or erythema the skin RIGHT knee: Moderate tenderness palpation along the past bursa. No focal medial or lateral joint line pain. Full range of motion. Ligamentously stable. Negative McMurray's. RIGHT ankle: Moderate amount of anterior lateral swelling & lipomatous change consistent with anterior ankle impingement syndrome. Ankle drawer testing is stable but painful. Mild tenderness palpation along the peroneal tendons but most focally over the anterior lateral ankle. Intrinsic ankle strength is 5/5. Slight equinus contracture at 95. Imaging: Xr Ankle 2 Views Right  Result Date: 03/04/2016 Findings: 2V right ankle: Overall well aligned.  No significant deformity.  No acute fracture/dislocation.  No significant degenerative changes. Well maintained joint spaces. Impression: Normal right ankle x-ray  Xr Knee 1-2 Views Right  Result Date: 03/04/2016 Findings: 2V right knee:: Overall well aligned.  No significant deformity.  No acute fracture/dislocation.  Only mild medial compartment subchondral sclerosis. Otherwise well maintained joint spaces. Impression: Essentially normal x-ray of the knee   Past Medical/Family/Surgical/Social History: Medications & Allergies reviewed per EMR Patient Active Problem List   Diagnosis Date Noted  . Genetic testing 02/04/2016  . Lynch syndrome 02/04/2016  . Atypical lobular hyperplasia (ALH) of left breast 01/19/2016  . Family history of breast cancer   . Family  history of ovarian cancer   . Family history of melanoma   . Internal and external bleeding hemorrhoids 06/24/2013   Past Medical History:  Diagnosis Date  . Anxiety   . Family history of breast cancer   . Family history of melanoma   . Family history of ovarian cancer   . Gallstones   . IBS (irritable bowel syndrome)   . Migraine   . Obesity   . Ovarian cyst    Family History  Problem Relation Age of Onset  . Breast cancer Mother 36  . Liver cancer Mother   . Lung cancer Father 64  . Breast cancer Sister 93  . Breast cancer Maternal Grandmother   . Ovarian cancer Maternal Grandmother   . Colon cancer Maternal Grandmother   . Cancer Paternal Grandfather     NOS  . Melanoma Maternal Aunt     mother's maternal half sister   Past Surgical History:  Procedure Laterality Date  . CESAREAN SECTION  2005   Social History   Occupational History  . Not on file.   Social History Main Topics  . Smoking status: Former Smoker    Packs/day: 0.50    Years: 20.00    Types: Cigarettes  . Smokeless tobacco:  Never Used     Comment: form given [id number]  . Alcohol use Yes     Comment: occ  . Drug use: No  . Sexual activity: Yes    Birth control/ protection: IUD

## 2016-03-21 DIAGNOSIS — C801 Malignant (primary) neoplasm, unspecified: Secondary | ICD-10-CM

## 2016-03-21 HISTORY — DX: Malignant (primary) neoplasm, unspecified: C80.1

## 2016-03-22 DIAGNOSIS — F4323 Adjustment disorder with mixed anxiety and depressed mood: Secondary | ICD-10-CM | POA: Diagnosis not present

## 2016-03-25 ENCOUNTER — Encounter (HOSPITAL_BASED_OUTPATIENT_CLINIC_OR_DEPARTMENT_OTHER): Payer: Self-pay | Admitting: *Deleted

## 2016-03-25 ENCOUNTER — Ambulatory Visit: Payer: Self-pay | Admitting: Plastic Surgery

## 2016-03-25 DIAGNOSIS — C50919 Malignant neoplasm of unspecified site of unspecified female breast: Secondary | ICD-10-CM

## 2016-03-25 NOTE — H&P (Signed)
Lynn Simpson is an 46 y.o. female.   Chief Complaint: left breast cancer HPI: The patient is a 46 y.o. yrs old wf here for evaluation of bilateral breasts.  She was diagnosed with LEFT breast fibrocystic changes with adenosis and calcifications, pseudoangiomatous stromal hyperplasia and lobular neoplasia.  This was diagnosed after a routing mammogram.  Now the area is tender from the biopsies.  She has children.  She is a smoker.  Her mother died at 11 from breast cancer and her sister had it as well.  She has mild ptosis of the breasts.  Her genetic testing has been submitted.  She is 5 feet 2 inches tall and weighs 250 pounds.  Preop bra = 42 B.  She would like to be bigger.  She is interested in bilateral mastectomies and keeping her nipples.   Past Medical History:  Diagnosis Date  . Anxiety   . Family history of breast cancer   . Family history of melanoma   . Family history of ovarian cancer   . Gallstones   . IBS (irritable bowel syndrome)   . Migraine   . Obesity   . Ovarian cyst     Past Surgical History:  Procedure Laterality Date  . CESAREAN SECTION  2005    Family History  Problem Relation Age of Onset  . Breast cancer Mother 45  . Liver cancer Mother   . Lung cancer Father 80  . Breast cancer Sister 36  . Breast cancer Maternal Grandmother   . Ovarian cancer Maternal Grandmother   . Colon cancer Maternal Grandmother   . Cancer Paternal Grandfather     NOS  . Melanoma Maternal Aunt     mother's maternal half sister   Social History:  reports that she has quit smoking. Her smoking use included Cigarettes. She has a 10.00 pack-year smoking history. She has never used smokeless tobacco. She reports that she drinks alcohol. She reports that she does not use drugs.  Allergies:  Allergies  Allergen Reactions  . Bee Venom   . Wellbutrin [Bupropion]      (Not in a hospital admission)  No results found for this or any previous visit (from the past 48  hour(s)). No results found.  Review of Systems  Constitutional: Negative.  Negative for fever.  HENT: Negative.   Eyes: Negative.   Respiratory: Negative.   Cardiovascular: Negative.   Gastrointestinal: Negative.  Negative for abdominal pain.  Genitourinary: Negative.   Musculoskeletal: Negative.   Skin: Negative.   Neurological: Negative.   Psychiatric/Behavioral: Negative.     There were no vitals taken for this visit. Physical Exam  Constitutional: She appears well-developed and well-nourished.  HENT:  Head: Normocephalic and atraumatic.  Eyes: Conjunctivae and EOM are normal. Pupils are equal, round, and reactive to light.  Cardiovascular: Normal rate.   Respiratory: Effort normal. She exhibits no tenderness.  GI: Soft. She exhibits no distension. There is no guarding.  Neurological: She is alert.  Skin: Skin is warm.  Psychiatric: She has a normal mood and affect. Her behavior is normal. Judgment and thought content normal.     Assessment/Plan Bilateral immediate breast reconstruction with expanders and FlexHD.  Wallace Going, DO 03/25/2016, 7:38 AM

## 2016-03-29 NOTE — Progress Notes (Signed)
Pt in for anesthesia consult. Interview done by Dr. Lissa Hoard.

## 2016-03-31 ENCOUNTER — Encounter (HOSPITAL_BASED_OUTPATIENT_CLINIC_OR_DEPARTMENT_OTHER): Payer: Self-pay | Admitting: *Deleted

## 2016-03-31 ENCOUNTER — Ambulatory Visit (HOSPITAL_BASED_OUTPATIENT_CLINIC_OR_DEPARTMENT_OTHER): Payer: BLUE CROSS/BLUE SHIELD | Admitting: Anesthesiology

## 2016-03-31 ENCOUNTER — Ambulatory Visit (HOSPITAL_BASED_OUTPATIENT_CLINIC_OR_DEPARTMENT_OTHER)
Admission: RE | Admit: 2016-03-31 | Discharge: 2016-04-01 | Disposition: A | Discharge status: Home / Self Care | Payer: BLUE CROSS/BLUE SHIELD | Source: Ambulatory Visit | Attending: Plastic Surgery | Admitting: Plastic Surgery

## 2016-03-31 ENCOUNTER — Encounter (HOSPITAL_BASED_OUTPATIENT_CLINIC_OR_DEPARTMENT_OTHER)
Admission: RE | Disposition: A | Discharge status: Home / Self Care | Payer: Self-pay | Source: Ambulatory Visit | Attending: Plastic Surgery

## 2016-03-31 DIAGNOSIS — Z87891 Personal history of nicotine dependence: Secondary | ICD-10-CM | POA: Insufficient documentation

## 2016-03-31 DIAGNOSIS — Z1501 Genetic susceptibility to malignant neoplasm of breast: Secondary | ICD-10-CM | POA: Diagnosis not present

## 2016-03-31 DIAGNOSIS — N6012 Diffuse cystic mastopathy of left breast: Secondary | ICD-10-CM | POA: Insufficient documentation

## 2016-03-31 DIAGNOSIS — R921 Mammographic calcification found on diagnostic imaging of breast: Secondary | ICD-10-CM | POA: Diagnosis not present

## 2016-03-31 DIAGNOSIS — Z91030 Bee allergy status: Secondary | ICD-10-CM | POA: Diagnosis not present

## 2016-03-31 DIAGNOSIS — F419 Anxiety disorder, unspecified: Secondary | ICD-10-CM | POA: Diagnosis not present

## 2016-03-31 DIAGNOSIS — Z803 Family history of malignant neoplasm of breast: Secondary | ICD-10-CM | POA: Diagnosis not present

## 2016-03-31 DIAGNOSIS — D242 Benign neoplasm of left breast: Secondary | ICD-10-CM | POA: Diagnosis not present

## 2016-03-31 DIAGNOSIS — N6489 Other specified disorders of breast: Secondary | ICD-10-CM | POA: Diagnosis not present

## 2016-03-31 DIAGNOSIS — Z6841 Body Mass Index (BMI) 40.0 and over, adult: Secondary | ICD-10-CM | POA: Diagnosis not present

## 2016-03-31 DIAGNOSIS — C50912 Malignant neoplasm of unspecified site of left female breast: Secondary | ICD-10-CM | POA: Diagnosis not present

## 2016-03-31 DIAGNOSIS — Z888 Allergy status to other drugs, medicaments and biological substances status: Secondary | ICD-10-CM | POA: Diagnosis not present

## 2016-03-31 DIAGNOSIS — N6082 Other benign mammary dysplasias of left breast: Secondary | ICD-10-CM | POA: Diagnosis not present

## 2016-03-31 DIAGNOSIS — N6011 Diffuse cystic mastopathy of right breast: Secondary | ICD-10-CM | POA: Diagnosis not present

## 2016-03-31 DIAGNOSIS — F329 Major depressive disorder, single episode, unspecified: Secondary | ICD-10-CM | POA: Diagnosis not present

## 2016-03-31 DIAGNOSIS — N6022 Fibroadenosis of left breast: Secondary | ICD-10-CM | POA: Insufficient documentation

## 2016-03-31 DIAGNOSIS — C50919 Malignant neoplasm of unspecified site of unspecified female breast: Secondary | ICD-10-CM

## 2016-03-31 HISTORY — PX: NIPPLE SPARING MASTECTOMY: SHX6537

## 2016-03-31 HISTORY — PX: BREAST RECONSTRUCTION WITH PLACEMENT OF TISSUE EXPANDER AND FLEX HD (ACELLULAR HYDRATED DERMIS): SHX6295

## 2016-03-31 LAB — POCT HEMOGLOBIN-HEMACUE: Hemoglobin: 14.7 g/dL (ref 12.0–15.0)

## 2016-03-31 SURGERY — MASTECTOMY, NIPPLE SPARING
Anesthesia: General | Site: Breast | Laterality: Bilateral

## 2016-03-31 MED ORDER — ROCURONIUM BROMIDE 10 MG/ML (PF) SYRINGE
PREFILLED_SYRINGE | INTRAVENOUS | Status: AC
  Filled 2016-03-31: qty 10

## 2016-03-31 MED ORDER — NEOSTIGMINE METHYLSULFATE 10 MG/10ML IV SOLN
INTRAVENOUS | Status: DC | PRN
  Administered 2016-03-31: 4 mg via INTRAVENOUS

## 2016-03-31 MED ORDER — SODIUM CHLORIDE 0.9 % IR SOLN
Status: DC | PRN
  Administered 2016-03-31: 500 mL

## 2016-03-31 MED ORDER — SUCCINYLCHOLINE CHLORIDE 20 MG/ML IJ SOLN
INTRAMUSCULAR | Status: DC | PRN
  Administered 2016-03-31: 100 mg via INTRAVENOUS

## 2016-03-31 MED ORDER — FENTANYL CITRATE (PF) 100 MCG/2ML IJ SOLN
INTRAMUSCULAR | Status: DC | PRN
  Administered 2016-03-31 (×2): 50 ug via INTRAVENOUS
  Administered 2016-03-31 (×5): 25 ug via INTRAVENOUS
  Administered 2016-03-31: 50 ug via INTRAVENOUS
  Administered 2016-03-31: 25 ug via INTRAVENOUS

## 2016-03-31 MED ORDER — CEFAZOLIN SODIUM-DEXTROSE 2-4 GM/100ML-% IV SOLN
2.0000 g | Freq: Three times a day (TID) | INTRAVENOUS | Status: DC
  Administered 2016-03-31 – 2016-04-01 (×3): 2 g via INTRAVENOUS
  Filled 2016-03-31 (×3): qty 100

## 2016-03-31 MED ORDER — NAPROXEN 500 MG PO TABS
500.0000 mg | ORAL_TABLET | Freq: Two times a day (BID) | ORAL | Status: DC | PRN
  Administered 2016-03-31: 500 mg via ORAL
  Filled 2016-03-31: qty 2

## 2016-03-31 MED ORDER — HYDROMORPHONE HCL 1 MG/ML IJ SOLN
0.2500 mg | INTRAMUSCULAR | Status: DC | PRN
  Administered 2016-03-31: 0.5 mg via INTRAVENOUS

## 2016-03-31 MED ORDER — MIDAZOLAM HCL 2 MG/2ML IJ SOLN
INTRAMUSCULAR | Status: AC
  Filled 2016-03-31: qty 2

## 2016-03-31 MED ORDER — HYDROCODONE-ACETAMINOPHEN 5-325 MG PO TABS
1.0000 | ORAL_TABLET | ORAL | Status: DC | PRN
  Administered 2016-03-31 – 2016-04-01 (×4): 2 via ORAL
  Filled 2016-03-31 (×4): qty 2

## 2016-03-31 MED ORDER — DIAZEPAM 2 MG PO TABS
2.0000 mg | ORAL_TABLET | Freq: Two times a day (BID) | ORAL | Status: DC | PRN
  Administered 2016-03-31 – 2016-04-01 (×2): 2 mg via ORAL
  Filled 2016-03-31 (×2): qty 1

## 2016-03-31 MED ORDER — LIDOCAINE 2% (20 MG/ML) 5 ML SYRINGE
INTRAMUSCULAR | Status: AC
  Filled 2016-03-31: qty 5

## 2016-03-31 MED ORDER — LACTATED RINGERS IV SOLN
INTRAVENOUS | Status: DC
  Administered 2016-03-31: 10 mL/h via INTRAVENOUS
  Administered 2016-03-31 (×3): via INTRAVENOUS

## 2016-03-31 MED ORDER — FENTANYL CITRATE (PF) 100 MCG/2ML IJ SOLN
50.0000 ug | INTRAMUSCULAR | Status: DC | PRN
  Administered 2016-03-31: 100 ug via INTRAVENOUS

## 2016-03-31 MED ORDER — CLONAZEPAM 0.5 MG PO TABS
0.5000 mg | ORAL_TABLET | Freq: Two times a day (BID) | ORAL | Status: DC
  Administered 2016-03-31: 0.5 mg via ORAL
  Filled 2016-03-31: qty 1

## 2016-03-31 MED ORDER — METOCLOPRAMIDE HCL 5 MG/ML IJ SOLN
INTRAMUSCULAR | Status: AC
  Filled 2016-03-31: qty 2

## 2016-03-31 MED ORDER — HYDROMORPHONE HCL 1 MG/ML IJ SOLN: 1.0000 mg | INTRAMUSCULAR | Status: DC | PRN

## 2016-03-31 MED ORDER — CHLORHEXIDINE GLUCONATE CLOTH 2 % EX PADS: 6.0000 | MEDICATED_PAD | Freq: Once | CUTANEOUS | Status: DC

## 2016-03-31 MED ORDER — ROCURONIUM BROMIDE 100 MG/10ML IV SOLN
INTRAVENOUS | Status: DC | PRN
  Administered 2016-03-31 (×2): 10 mg via INTRAVENOUS
  Administered 2016-03-31: 30 mg via INTRAVENOUS
  Administered 2016-03-31: 40 mg via INTRAVENOUS
  Administered 2016-03-31 (×3): 10 mg via INTRAVENOUS

## 2016-03-31 MED ORDER — EPHEDRINE 5 MG/ML INJ
INTRAVENOUS | Status: AC
  Filled 2016-03-31: qty 10

## 2016-03-31 MED ORDER — CEFAZOLIN SODIUM-DEXTROSE 2-4 GM/100ML-% IV SOLN
INTRAVENOUS | Status: AC
  Filled 2016-03-31: qty 100

## 2016-03-31 MED ORDER — DIPHENHYDRAMINE HCL 12.5 MG/5ML PO ELIX: 12.5000 mg | ORAL_SOLUTION | Freq: Four times a day (QID) | ORAL | Status: DC | PRN

## 2016-03-31 MED ORDER — PROPOFOL 10 MG/ML IV BOLUS
INTRAVENOUS | Status: DC | PRN
  Administered 2016-03-31: 40 mg via INTRAVENOUS
  Administered 2016-03-31: 100 mg via INTRAVENOUS
  Administered 2016-03-31: 160 mg via INTRAVENOUS

## 2016-03-31 MED ORDER — DEXAMETHASONE SODIUM PHOSPHATE 10 MG/ML IJ SOLN
INTRAMUSCULAR | Status: AC
Start: 2016-03-31 — End: 2016-03-31
  Filled 2016-03-31: qty 1

## 2016-03-31 MED ORDER — FENTANYL CITRATE (PF) 100 MCG/2ML IJ SOLN
INTRAMUSCULAR | Status: AC
  Filled 2016-03-31: qty 2

## 2016-03-31 MED ORDER — HYDROMORPHONE HCL 1 MG/ML IJ SOLN
INTRAMUSCULAR | Status: AC
  Filled 2016-03-31: qty 1

## 2016-03-31 MED ORDER — CEFAZOLIN SODIUM-DEXTROSE 2-4 GM/100ML-% IV SOLN: 2.0000 g | INTRAVENOUS | Status: DC

## 2016-03-31 MED ORDER — DIPHENHYDRAMINE HCL 50 MG/ML IJ SOLN: 12.5000 mg | Freq: Four times a day (QID) | INTRAMUSCULAR | Status: DC | PRN

## 2016-03-31 MED ORDER — DEXAMETHASONE SODIUM PHOSPHATE 4 MG/ML IJ SOLN
INTRAMUSCULAR | Status: DC | PRN
  Administered 2016-03-31: 10 mg via INTRAVENOUS

## 2016-03-31 MED ORDER — SCOPOLAMINE 1 MG/3DAYS TD PT72: 1.0000 | MEDICATED_PATCH | Freq: Once | TRANSDERMAL | Status: DC | PRN

## 2016-03-31 MED ORDER — METOCLOPRAMIDE HCL 5 MG/ML IJ SOLN
INTRAMUSCULAR | Status: DC | PRN
  Administered 2016-03-31: 10 mg via INTRAVENOUS

## 2016-03-31 MED ORDER — MIDAZOLAM HCL 5 MG/5ML IJ SOLN
INTRAMUSCULAR | Status: DC | PRN
  Administered 2016-03-31: 2 mg via INTRAVENOUS

## 2016-03-31 MED ORDER — PROPOFOL 10 MG/ML IV BOLUS
INTRAVENOUS | Status: AC
  Filled 2016-03-31: qty 20

## 2016-03-31 MED ORDER — POLYETHYLENE GLYCOL 3350 17 G PO PACK: 17.0000 g | PACK | Freq: Every day | ORAL | Status: DC | PRN

## 2016-03-31 MED ORDER — LIDOCAINE HCL (CARDIAC) 20 MG/ML IV SOLN
INTRAVENOUS | Status: DC | PRN
  Administered 2016-03-31: 80 mg via INTRAVENOUS

## 2016-03-31 MED ORDER — MIDAZOLAM HCL 2 MG/2ML IJ SOLN
1.0000 mg | INTRAMUSCULAR | Status: DC | PRN
  Administered 2016-03-31: 2 mg via INTRAVENOUS

## 2016-03-31 MED ORDER — KCL IN DEXTROSE-NACL 20-5-0.45 MEQ/L-%-% IV SOLN
INTRAVENOUS | Status: DC
  Administered 2016-03-31: 15:00:00 via INTRAVENOUS
  Filled 2016-03-31: qty 1000

## 2016-03-31 MED ORDER — CEFAZOLIN SODIUM-DEXTROSE 2-4 GM/100ML-% IV SOLN
2.0000 g | INTRAVENOUS | Status: AC
  Administered 2016-03-31: 2 g via INTRAVENOUS

## 2016-03-31 MED ORDER — SERTRALINE HCL 50 MG PO TABS
150.0000 mg | ORAL_TABLET | Freq: Every day | ORAL | Status: DC
  Administered 2016-03-31: 150 mg via ORAL

## 2016-03-31 MED ORDER — ONDANSETRON HCL 4 MG/2ML IJ SOLN
INTRAMUSCULAR | Status: AC
  Filled 2016-03-31: qty 2

## 2016-03-31 MED ORDER — BUPIVACAINE-EPINEPHRINE (PF) 0.5% -1:200000 IJ SOLN
INTRAMUSCULAR | Status: DC | PRN
  Administered 2016-03-31 (×2): 20 mL

## 2016-03-31 MED ORDER — ONDANSETRON HCL 4 MG/2ML IJ SOLN: 4.0000 mg | Freq: Four times a day (QID) | INTRAMUSCULAR | Status: DC | PRN

## 2016-03-31 MED ORDER — NITROGLYCERIN 2 % TD OINT
0.5000 [in_us] | TOPICAL_OINTMENT | Freq: Four times a day (QID) | TRANSDERMAL | Status: DC
  Administered 2016-03-31 – 2016-04-01 (×3): 0.5 [in_us] via TOPICAL

## 2016-03-31 MED ORDER — ONDANSETRON 4 MG PO TBDP: 4.0000 mg | ORAL_TABLET | Freq: Four times a day (QID) | ORAL | Status: DC | PRN

## 2016-03-31 MED ORDER — SENNA 8.6 MG PO TABS
1.0000 | ORAL_TABLET | Freq: Two times a day (BID) | ORAL | Status: DC
  Administered 2016-03-31: 8.6 mg via ORAL
  Filled 2016-03-31: qty 1

## 2016-03-31 MED ORDER — GLYCOPYRROLATE 0.2 MG/ML IJ SOLN
INTRAMUSCULAR | Status: DC | PRN
  Administered 2016-03-31: 0.6 mg via INTRAVENOUS

## 2016-03-31 MED ORDER — EPHEDRINE SULFATE 50 MG/ML IJ SOLN
INTRAMUSCULAR | Status: DC | PRN
  Administered 2016-03-31 (×2): 10 mg via INTRAVENOUS

## 2016-03-31 MED ORDER — ACETAMINOPHEN 500 MG PO TABS
1000.0000 mg | ORAL_TABLET | Freq: Four times a day (QID) | ORAL | Status: DC
Start: 2016-03-31 — End: 2016-04-01
  Filled 2016-03-31: qty 2

## 2016-03-31 MED ORDER — ONDANSETRON HCL 4 MG/2ML IJ SOLN
INTRAMUSCULAR | Status: DC | PRN
  Administered 2016-03-31: 4 mg via INTRAVENOUS

## 2016-03-31 SURGICAL SUPPLY — 90 items
ADH SKN CLS APL DERMABOND .7 (GAUZE/BANDAGES/DRESSINGS) ×2
APPLIER CLIP 11 MED OPEN (CLIP)
APPLIER CLIP 9.375 MED OPEN (MISCELLANEOUS) ×2
APR CLP MED 11 20 MLT OPN (CLIP)
APR CLP MED 9.3 20 MLT OPN (MISCELLANEOUS) ×1
BAG DECANTER FOR FLEXI CONT (MISCELLANEOUS) ×2 IMPLANT
BINDER BREAST LRG (GAUZE/BANDAGES/DRESSINGS) IMPLANT
BINDER BREAST MEDIUM (GAUZE/BANDAGES/DRESSINGS) IMPLANT
BINDER BREAST XLRG (GAUZE/BANDAGES/DRESSINGS) IMPLANT
BINDER BREAST XXLRG (GAUZE/BANDAGES/DRESSINGS) ×1 IMPLANT
BIOPATCH RED 1 DISK 7.0 (GAUZE/BANDAGES/DRESSINGS) ×3 IMPLANT
BLADE CLIPPER SURG (BLADE) IMPLANT
BLADE HEX COATED 2.75 (ELECTRODE) ×1 IMPLANT
BLADE SURG 10 STRL SS (BLADE) ×2 IMPLANT
BLADE SURG 15 STRL LF DISP TIS (BLADE) ×2 IMPLANT
BLADE SURG 15 STRL SS (BLADE) ×6
BNDG GAUZE ELAST 4 BULKY (GAUZE/BANDAGES/DRESSINGS) ×2 IMPLANT
CANISTER SUCT 1200ML W/VALVE (MISCELLANEOUS) ×2 IMPLANT
CHLORAPREP W/TINT 26ML (MISCELLANEOUS) ×3 IMPLANT
CLIP APPLIE 11 MED OPEN (CLIP) ×1 IMPLANT
CLIP APPLIE 9.375 MED OPEN (MISCELLANEOUS) IMPLANT
CONT SPECI 4OZ STER CLIK (MISCELLANEOUS) ×2 IMPLANT
COVER BACK TABLE 60X90IN (DRAPES) ×4 IMPLANT
COVER MAYO STAND STRL (DRAPES) ×3 IMPLANT
DECANTER SPIKE VIAL GLASS SM (MISCELLANEOUS) ×1 IMPLANT
DERMABOND ADVANCED (GAUZE/BANDAGES/DRESSINGS) ×2
DERMABOND ADVANCED .7 DNX12 (GAUZE/BANDAGES/DRESSINGS) IMPLANT
DEVICE DISSECT PLASMABLAD 3.0S (MISCELLANEOUS) ×1 IMPLANT
DRAIN CHANNEL 19F RND (DRAIN) ×4 IMPLANT
DRAIN HEMOVAC 1/8 X 5 (WOUND CARE) IMPLANT
DRAPE LAPAROSCOPIC ABDOMINAL (DRAPES) ×3 IMPLANT
DRAPE UTILITY XL STRL (DRAPES) ×2 IMPLANT
DRSG PAD ABDOMINAL 8X10 ST (GAUZE/BANDAGES/DRESSINGS) ×6 IMPLANT
ELECT BLADE 4.0 EZ CLEAN MEGAD (MISCELLANEOUS) ×2
ELECT COATED BLADE 2.86 ST (ELECTRODE) ×2 IMPLANT
ELECT REM PT RETURN 9FT ADLT (ELECTROSURGICAL) ×4
ELECTRODE BLDE 4.0 EZ CLN MEGD (MISCELLANEOUS) ×1 IMPLANT
ELECTRODE REM PT RTRN 9FT ADLT (ELECTROSURGICAL) ×2 IMPLANT
EVACUATOR SILICONE 100CC (DRAIN) ×4 IMPLANT
GLOVE BIO SURGEON STRL SZ 6.5 (GLOVE) ×6 IMPLANT
GLOVE BIO SURGEON STRL SZ7 (GLOVE) ×2 IMPLANT
GLOVE BIO SURGEON STRL SZ7.5 (GLOVE) ×2 IMPLANT
GLOVE BIOGEL PI IND STRL 6.5 (GLOVE) IMPLANT
GLOVE BIOGEL PI IND STRL 7.0 (GLOVE) IMPLANT
GLOVE BIOGEL PI INDICATOR 6.5 (GLOVE) ×1
GLOVE BIOGEL PI INDICATOR 7.0 (GLOVE) ×3
GOWN STRL REUS W/ TWL LRG LVL3 (GOWN DISPOSABLE) ×5 IMPLANT
GOWN STRL REUS W/TWL LRG LVL3 (GOWN DISPOSABLE) ×12
GRAFT FLEX HD BILAT 4X16 THICK (Tissue Mesh) ×2 IMPLANT
ILLUMINATOR WAVEGUIDE N/F (MISCELLANEOUS) IMPLANT
IMPL BREAST TIS EXP M 350CC (Breast) IMPLANT
IMPLANT BREAST TIS EXP M 350CC (Breast) ×4 IMPLANT
IV NS 1000ML (IV SOLUTION) ×2
IV NS 1000ML BAXH (IV SOLUTION) IMPLANT
IV NS 500ML (IV SOLUTION)
IV NS 500ML BAXH (IV SOLUTION) ×1 IMPLANT
KIT FILL SYSTEM UNIVERSAL (SET/KITS/TRAYS/PACK) IMPLANT
LIGHT WAVEGUIDE WIDE FLAT (MISCELLANEOUS) ×1 IMPLANT
NDL HYPO 25X1 1.5 SAFETY (NEEDLE) ×1 IMPLANT
NEEDLE HYPO 25X1 1.5 SAFETY (NEEDLE) IMPLANT
NS IRRIG 1000ML POUR BTL (IV SOLUTION) ×2 IMPLANT
PACK BASIN DAY SURGERY FS (CUSTOM PROCEDURE TRAY) ×4 IMPLANT
PENCIL BUTTON HOLSTER BLD 10FT (ELECTRODE) ×3 IMPLANT
PIN SAFETY STERILE (MISCELLANEOUS) ×3 IMPLANT
PLASMABLADE 3.0S (MISCELLANEOUS) ×2
SET ASEPTIC TRANSFER (MISCELLANEOUS) ×2 IMPLANT
SLEEVE SCD COMPRESS KNEE MED (MISCELLANEOUS) ×3 IMPLANT
SPONGE GAUZE 4X4 12PLY STER LF (GAUZE/BANDAGES/DRESSINGS) IMPLANT
SPONGE LAP 18X18 X RAY DECT (DISPOSABLE) ×7 IMPLANT
STAPLER VISISTAT 35W (STAPLE) ×2 IMPLANT
SUT ETHILON 2 0 FS 18 (SUTURE) ×1 IMPLANT
SUT MNCRL AB 4-0 PS2 18 (SUTURE) ×3 IMPLANT
SUT MON AB 3-0 SH 27 (SUTURE) ×4
SUT MON AB 3-0 SH27 (SUTURE) ×1 IMPLANT
SUT MON AB 5-0 PS2 18 (SUTURE) ×3 IMPLANT
SUT PDS 3-0 CT2 (SUTURE)
SUT PDS AB 2-0 CT2 27 (SUTURE) ×3 IMPLANT
SUT PDS II 3-0 CT2 27 ABS (SUTURE) IMPLANT
SUT SILK 2 0 SH (SUTURE) IMPLANT
SUT SILK 3 0 PS 1 (SUTURE) ×3 IMPLANT
SUT VIC AB 3-0 SH 27 (SUTURE)
SUT VIC AB 3-0 SH 27X BRD (SUTURE) IMPLANT
SUT VICRYL 3-0 CR8 SH (SUTURE) ×1 IMPLANT
SYR BULB IRRIGATION 50ML (SYRINGE) ×2 IMPLANT
SYR CONTROL 10ML LL (SYRINGE) ×1 IMPLANT
TOWEL OR 17X24 6PK STRL BLUE (TOWEL DISPOSABLE) ×8 IMPLANT
TOWEL OR NON WOVEN STRL DISP B (DISPOSABLE) ×2 IMPLANT
TUBE CONNECTING 20X1/4 (TUBING) ×4 IMPLANT
UNDERPAD 30X30 (UNDERPADS AND DIAPERS) ×4 IMPLANT
YANKAUER SUCT BULB TIP NO VENT (SUCTIONS) ×4 IMPLANT

## 2016-03-31 NOTE — Op Note (Signed)
03/31/2016  1:37 PM  PATIENT:  Lynn Simpson  46 y.o. female  PRE-OPERATIVE DIAGNOSIS:  Complex sclerosing lesion left breast, PMS2 MUTATION, LYNCH SYMDROME  POST-OPERATIVE DIAGNOSIS:  Complex sclerosing lesion left breast, PMS2 MUTATION, LYNCH SYMDROME  PROCEDURE:  Procedure(s): BILATERAL NIPPLE SPARING MASTECTOMIES (Bilateral)   SURGEON:  Surgeon(s) and Role: Panel 1:    * Jovita Kussmaul, MD - Primary  Panel 2:    * Loel Lofty Dillingham, DO - Primary  PHYSICIAN ASSISTANT:   ASSISTANTS: Dr. Marla Roe   ANESTHESIA:   general  EBL:  Total I/O In: 3500 [I.V.:3500] Out: 350 [Urine:150; Blood:200]  BLOOD ADMINISTERED:none  DRAINS: none   LOCAL MEDICATIONS USED:  NONE  SPECIMEN:  Source of Specimen:  bilateral nipple sparing mastectomies  DISPOSITION OF SPECIMEN:  PATHOLOGY  COUNTS:  YES  TOURNIQUET:  * No tourniquets in log *  DICTATION: .Dragon Dictation   After informed consent was obtained the patient was brought to the operating room and placed in the supine position on the operating table. After adequate induction of general anesthesia the patient's bilateral chest, breast, and axillary areas were prepped with ChloraPrep, allowed to dry, and draped in the usual sterile manner. An appropriate timeout was performed. Attention was first turned to the right breast. An inframammary incision was made with a 10 blade knife. The incision was carried through the skin and subcutaneous tissue sharply with electrocautery. The dissection was initially carried along the inferior edge of the breast tissue until the chest wall was encountered. The breast was then separated from the pectoralis muscle with the pectoralis fascia sharply with the plasma blade. Once the posterior portion of the breast was free the dissection was then carried out anteriorly between the breast tissue in the subcutaneous fat. This was done by a combination of sharp dissection with the plasma blade and some  sharp dissection with the Metzenbaum scissors. When the dissection reached the nipple a small piece of tissue was removed sharply with the Metzenbaum scissors from the nipple and then sent pathology for frozen section which was negative. The area of the nipple was marked with a short double stitch. Once the entire breast was freed and removed it was oriented with a short stitch on the superior surface and a long stitch on the lateral surface. The breast was then sent to pathology for further evaluation. Hemostasis was achieved using the plasma blade. The wound was packed with a moistened lap sponge. The skin flaps and nipple appeared viable. Attention was then turned to the left breast. A similar inframammary incision was made with a 10 blade knife. The incision was carried through the skin and subcutaneous tissue sharply with the plasma blade. The dissection was carried along the inferior edge of the breast tissue until the chest wall was encountered. The dissection was then carried superiorly separating the breast tissue from the pectoralis muscle with the pectoralis fascia. Once the posterior aspect of the breast was freed the dissection was then carried out anteriorly. The breast tissue was separated from the subcutaneous tissue by a combination of sharp dissection with the plasma blade and some sharp dissection with Metzenbaum scissors. Once the dissection reached the nipple than a small piece of tissue from the posterior aspect of the nipple was excised sharply with the Metzenbaum scissors and sent to pathology for frozen section which was negative. Once the dissection reached the superior portion of the breast and the breast was freed from its attachments then the breast was removed  and oriented with a short single stitch on the superior surface, a long single stitch on the lateral surface, and a short double stitch at the site of the nipple. Hemostasis was achieved using the plasma blade. The wound was then  packed with a moistened lap sponge. The skin flaps and nipple appeared viable. At this point the operation was turned over to Dr. Marla Roe for the reconstruction part of the operation. Her portion of the case will be dictated separately. The patient had tolerated the procedure well and was in stable condition. At this point all needle sponge counts were correct.  PLAN OF CARE: Admit for overnight observation  PATIENT DISPOSITION:  PACU - hemodynamically stable.   Delay start of Pharmacological VTE agent (>24hrs) due to surgical blood loss or risk of bleeding: no

## 2016-03-31 NOTE — Discharge Instructions (Addendum)
Sink bath No heavy lifting Continue binder or sports bra Continue .5 inch of Nitroglycerin to right nipple only, twice a day Continue all until follow up with Dr Marla Roe  About my Jackson-Pratt Bulb Drain  What is a Jackson-Pratt bulb? A Jackson-Pratt is a soft, round device used to collect drainage. It is connected to a long, thin drainage catheter, which is held in place by one or two small stiches near your surgical incision site. When the bulb is squeezed, it forms a vacuum, forcing the drainage to empty into the bulb.  Emptying the Jackson-Pratt bulb- To empty the bulb: 1. Release the plug on the top of the bulb. 2. Pour the bulb's contents into a measuring container which your nurse will provide. 3. Record the time emptied and amount of drainage. Empty the drain(s) as often as your     doctor or nurse recommends.  Date                  Time                    Amount (Drain 1)                 Amount (Drain 2)  _____________________________________________________________________  _____________________________________________________________________  _____________________________________________________________________  _____________________________________________________________________  _____________________________________________________________________  _____________________________________________________________________  _____________________________________________________________________  _____________________________________________________________________  Squeezing the Jackson-Pratt Bulb- To squeeze the bulb: 1. Make sure the plug at the top of the bulb is open. 2. Squeeze the bulb tightly in your fist. You will hear air squeezing from the bulb. 3. Replace the plug while the bulb is squeezed. 4. Use a safety pin to attach the bulb to your clothing. This will keep the catheter from     pulling at the bulb insertion site.  When to call your doctor- Call  your doctor if:  Drain site becomes red, swollen or hot.  You have a fever greater than 101 degrees F.  There is oozing at the drain site.  Drain falls out (apply a guaze bandage over the drain hole and secure it with tape).  Drainage increases daily not related to activity patterns. (You will usually have more drainage when you are active than when you are resting.)  Drainage has a bad odor.

## 2016-03-31 NOTE — Anesthesia Procedure Notes (Signed)
Procedure Name: Intubation Date/Time: 03/31/2016 8:06 AM Performed by: Justice Rocher Pre-anesthesia Checklist: Patient identified, Emergency Drugs available, Suction available, Patient being monitored and Timeout performed Patient Re-evaluated:Patient Re-evaluated prior to inductionOxygen Delivery Method: Circle system utilized Preoxygenation: Pre-oxygenation with 100% oxygen Intubation Type: IV induction Ventilation: Mask ventilation without difficulty Tube type: Oral Tube size: 7.0 mm Number of attempts: 1 Airway Equipment and Method: Stylet and Oral airway Placement Confirmation: ETT inserted through vocal cords under direct vision,  positive ETCO2 and breath sounds checked- equal and bilateral Secured at: 20 cm Tube secured with: Tape Dental Injury: Teeth and Oropharynx as per pre-operative assessment

## 2016-03-31 NOTE — Anesthesia Procedure Notes (Addendum)
Anesthesia Regional Block:  Pectoralis block  Pre-Anesthetic Checklist: ,, timeout performed, Correct Patient, Correct Site, Correct Laterality, Correct Procedure, Correct Position, site marked, Risks and benefits discussed, pre-op evaluation,  At surgeon's request and post-op pain management  Laterality: Left and Right  Prep: Maximum Sterile Barrier Precautions used, chloraprep       Needles:  Injection technique: Single-shot  Needle Type: Echogenic Stimulator Needle     Needle Length: 9cm 9 cm Needle Gauge: 21 and 21 G    Additional Needles:  Procedures: ultrasound guided (picture in chart) Pectoralis block Narrative:  Start time: 03/31/2016 7:30 AM End time: 03/31/2016 7:40 AM Injection made incrementally with aspirations every 5 mL. Anesthesiologist: Roderic Palau  Additional Notes: 2% Lidocaine skin wheel.

## 2016-03-31 NOTE — Interval H&P Note (Signed)
History and Physical Interval Note:  03/31/2016 10:14 AM  Lynn Simpson  has presented today for surgery, with the diagnosis of ALH LEFT BREAST, PMS2 MUTATION, LYNCH SYMDROME  The various methods of treatment have been discussed with the patient and family. After consideration of risks, benefits and other options for treatment, the patient has consented to  Procedure(s): BILATERAL NIPPLE SPARING MASTECTOMIES (Bilateral) BILATERAL BREAST RECONSTRUCTION WITH PLACEMENT OF TISSUE EXPANDER AND FLEX HD (ACELLULAR HYDRATED DERMIS) (Bilateral) as a surgical intervention .  The patient's history has been reviewed, patient examined, no change in status, stable for surgery.  I have reviewed the patient's chart and labs.  Questions were answered to the patient's satisfaction.     Wallace Going

## 2016-03-31 NOTE — Anesthesia Preprocedure Evaluation (Addendum)
Anesthesia Evaluation  Patient identified by MRN, date of birth, ID band Patient awake    Reviewed: Allergy & Precautions, H&P , NPO status , Patient's Chart, lab work & pertinent test results  Airway Mallampati: II  TM Distance: >3 FB Neck ROM: Full    Dental no notable dental hx. (+) Teeth Intact, Dental Advisory Given   Pulmonary neg pulmonary ROS, former smoker,    Pulmonary exam normal breath sounds clear to auscultation       Cardiovascular negative cardio ROS   Rhythm:Regular Rate:Normal     Neuro/Psych  Headaches, negative psych ROS   GI/Hepatic negative GI ROS, Neg liver ROS,   Endo/Other  Morbid obesity  Renal/GU negative Renal ROS  negative genitourinary   Musculoskeletal   Abdominal   Peds  Hematology negative hematology ROS (+)   Anesthesia Other Findings   Reproductive/Obstetrics negative OB ROS                            Anesthesia Physical Anesthesia Plan  ASA: III  Anesthesia Plan: General   Post-op Pain Management:    Induction: Intravenous  Airway Management Planned: Oral ETT  Additional Equipment:   Intra-op Plan:   Post-operative Plan: Extubation in OR  Informed Consent: I have reviewed the patients History and Physical, chart, labs and discussed the procedure including the risks, benefits and alternatives for the proposed anesthesia with the patient or authorized representative who has indicated his/her understanding and acceptance.   Dental advisory given  Plan Discussed with: CRNA  Anesthesia Plan Comments:         Anesthesia Quick Evaluation

## 2016-03-31 NOTE — Op Note (Signed)
Op report    DATE OF OPERATION:  03/31/2016  LOCATION: [place]  SURGICAL DIVISION: Plastic Surgery  PREOPERATIVE DIAGNOSES:  1. Left Breast cancer.    POSTOPERATIVE DIAGNOSES:  1. Left Breast cancer.   PROCEDURE:  1. Bilateral immediate breast reconstruction with placement of Acellular Dermal Matrix and tissue expanders.  SURGEON: Claire Sanger Dillingham, DO  ASSISTANT: Shawn Rayburn, PA  ANESTHESIA:  General.   COMPLICATIONS: None.   IMPLANTS: Left - Mentor 350 cc. Ref [id number].  Serial Number [id number],  200 cc of saline placed Right - Mentor 350 cc. Ref [id number].  Serial Number [id number], 250 cc of saline placed Acellular Dermal Matrix 4 x 16 cm  INDICATIONS FOR PROCEDURE:  The patient, Lynn Simpson, is a 46 y.o. female born on 1970/07/23, is here for  immediate first stage breast reconstruction with placement of bilateral tissue expander and Acellular dermal matrix. MRN: [id number]  CONSENT:  Informed consent was obtained directly from the patient. Risks, benefits and alternatives were fully discussed. Specific risks including but not limited to bleeding, infection, hematoma, seroma, scarring, pain, implant infection, implant extrusion, capsular contracture, asymmetry, wound healing problems, and need for further surgery were all discussed. The patient did have an ample opportunity to have her questions answered to her satisfaction.   DESCRIPTION OF PROCEDURE:  The patient was taken to the operating room by the general surgery team. SCDs were placed and IV antibiotics were given. The patient's chest was prepped and draped in a sterile fashion. A time out was performed and the implants to be used were identified.  Bilateral mastectomies were performed.  Once the general surgery team had completed their portion of the case the patient was rendered to the plastic and reconstructive surgery team.  Right: The pectoralis major muscle was lifted  from the chest wall with release of the lateral edge and lateral inframammary fold.  The pocket was irrigated with antibiotic solution and hemostasis was achieved with electrocautery.  The ADM was then prepared according to the manufacture guidelines and slits placed to help with postoperative fluid management.  The ADM was then sutured to the inferior and lateral edge of the inframammary fold with 2-0 PDS starting with an interrupted stitch and then a running stitch.  The lateral portion was sutured to with interrupted sutures after the expander was placed.  The expander was prepared according to the manufacture guidelines, the air evacuated and then it was placed under the ADM and pectoralis major muscle.  The inferior and lateral tabs were used to secure the expander to the chest wall with 2-0 PDS.  The drain was placed at the inframammary fold over the ADM and secured to the skin with 3-0 Silk.   The deep layers were closed with 3-0 Monocryl followed by 4-0 Monocryl.  The skin was closed with 5-0 Monocryl and then dermabond was applied.    Left: Right: The pectoralis major muscle was lifted from the chest wall with release of the lateral edge and lateral inframammary fold.  The pocket was irrigated with antibiotic solution and hemostasis was achieved with electrocautery.  The ADM was then prepared according to the manufacture guidelines and slits placed to help with postoperative fluid management.  The ADM was then sutured to the inferior and lateral edge of the inframammary fold with 2-0 PDS starting with an interrupted stitch and then a running stitch.  The lateral portion was sutured to with interrupted sutures after the expander was placed.  The expander  was prepared according to the manufacture guidelines, the air evacuated and then it was placed under the ADM and pectoralis major muscle.  The inferior and lateral tabs were used to secure the expander to the chest wall with 2-0 PDS.  The drain was placed  at the inframammary fold over the ADM and secured to the skin with 3-0 Silk.  The deep layers were closed with 3-0 Monocryl followed by 4-0 Monocryl.  The skin was closed with 5-0 Monocryl and then dermabond was applied.  The ABDs and breast binder were placed.  The patient tolerated the procedure well and there were no complications.  The patient was allowed to wake from anesthesia and taken to the recovery room in satisfactory condition.

## 2016-03-31 NOTE — Progress Notes (Signed)
Assisted Dr. Edmond Fitzgerald with right, left, ultrasound guided, pectoralis block. Side rails up, monitors on throughout procedure. See vital signs in flow sheet. Tolerated Procedure well. 

## 2016-03-31 NOTE — H&P (Signed)
[facility]  Location: [facility] Patient #: [id number] DOB: Jan 31, 1971 Married / Language: English / Race: White Female   History of Present Illness  The patient is a 46 year old female who presents with a breast mass. We are asked to see the patient in consultation by Dr. Molli Posey to evaluate her for a left breast complex sclerosing lesion. The patient is a 46 year old white female who has been experiencing breast pain for quite some time. She recently went for a routine screening mammogram at which time 2 abnormalities were noted in the lower outer left breast. Both were biopsied. 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 She denies any nipple discharge. She does have an inverted left nipple that occurred after her third pregnancy. She also has a significant family history of breast cancer in her mother and sister both of whom were diagnosed in their 61s. She does smoke about a pack her cigarettes a day.   Other Problems Anxiety Disorder  Cholelithiasis  Depression  Hemorrhoids  Migraine Headache   Past Surgical History  Breast Biopsy  Left. Cesarean Section - 1  Oral Surgery   Diagnostic Studies History Colonoscopy  never Mammogram  within last year Pap Smear  1-5 years ago  Allergies  Wellbutrin *ANTIDEPRESSANTS*   Medication History  Sertraline HCl (100MG Tablet, Oral) Active. Naproxen (250MG Tablet, Oral) Active. Medications Reconciled  Social History  Alcohol use  Occasional alcohol use. Caffeine use  Carbonated beverages, Coffee, Tea. Illicit drug use  Remotely quit drug use. Tobacco use  Current some day smoker.  Family History Alcohol Abuse  Mother. Breast Cancer  Mother, Sister. Cancer  Father, Mother.  Pregnancy / Birth History  Age at menarche  5 years. Contraceptive History  Intrauterine  device. Gravida  3 Irregular periods  Length (months) of breastfeeding  3-6 Maternal age  61-25 Para  3    Review of Systems  General Not Present- Appetite Loss, Chills, Fatigue, Fever, Night Sweats, Weight Gain and Weight Loss. Skin Not Present- Change in Wart/Mole, Dryness, Hives, Jaundice, New Lesions, Non-Healing Wounds, Rash and Ulcer. HEENT Present- Seasonal Allergies. Not Present- Earache, Hearing Loss, Hoarseness, Nose Bleed, Oral Ulcers, Ringing in the Ears, Sinus Pain, Sore Throat, Visual Disturbances, Wears glasses/contact lenses and Yellow Eyes. Respiratory Present- Chronic Cough. Not Present- Bloody sputum, Difficulty Breathing, Snoring and Wheezing. Breast Present- Breast Mass. Not Present- Breast Pain, Nipple Discharge and Skin Changes. Cardiovascular Present- Swelling of Extremities. Not Present- Chest Pain, Difficulty Breathing Lying Down, Leg Cramps, Palpitations, Rapid Heart Rate and Shortness of Breath. Gastrointestinal Present- Abdominal Pain, Hemorrhoids and Rectal Pain. Not Present- Bloating, Bloody Stool, Change in Bowel Habits, Chronic diarrhea, Constipation, Difficulty Swallowing, Excessive gas, Gets full quickly at meals, Indigestion, Nausea and Vomiting. Musculoskeletal Present- Joint Stiffness and Swelling of Extremities. Not Present- Back Pain, Joint Pain, Muscle Pain and Muscle Weakness. Neurological Present- Headaches. Not Present- Decreased Memory, Fainting, Numbness, Seizures, Tingling, Tremor, Trouble walking and Weakness. Psychiatric Present- Anxiety and Depression. Not Present- Bipolar, Change in Sleep Pattern, Fearful and Frequent crying. Hematology Not Present- Blood Thinners, Easy Bruising, Excessive bleeding, Gland problems, HIV and Persistent Infections.  Vitals Weight: 248 lb Height: 66in Body Surface Area: 2.19 m Body Mass Index: 40.03 kg/m  Temp.: 98.1F(Temporal)  Pulse: 79 (Regular)  BP: 128/80 (Sitting, Left Arm,  Standard)       Physical Exam  General Mental Status-Alert. General Appearance-Consistent with  stated age. Hydration-Well hydrated. Voice-Normal.  Head and Neck Head-normocephalic, atraumatic with no lesions or palpable masses. Trachea-midline. Thyroid Gland Characteristics - normal size and consistency.  Eye Eyeball - Bilateral-Extraocular movements intact. Sclera/Conjunctiva - Bilateral-No scleral icterus.  Chest and Lung Exam Chest and lung exam reveals -quiet, even and easy respiratory effort with no use of accessory muscles and on auscultation, normal breath sounds, no adventitious sounds and normal vocal resonance. Inspection Chest Wall - Normal. Back - normal.  Breast Note: There is no palpable mass in either breast. There is no palpable axillary, supraclavicular, or cervical lymphadenopathy. There is some inversion to the left nipple.   Cardiovascular Cardiovascular examination reveals -normal heart sounds, regular rate and rhythm with no murmurs and normal pedal pulses bilaterally.  Abdomen Inspection Inspection of the abdomen reveals - No Hernias. Skin - Scar - no surgical scars. Palpation/Percussion Palpation and Percussion of the abdomen reveal - Soft, Non Tender, No Rebound tenderness, No Rigidity (guarding) and No hepatosplenomegaly. Auscultation Auscultation of the abdomen reveals - Bowel sounds normal.  Neurologic Neurologic evaluation reveals -alert and oriented x 3 with no impairment of recent or remote memory. Mental Status-Normal.  Musculoskeletal Normal Exam - Left-Upper Extremity Strength Normal and Lower Extremity Strength Normal. Normal Exam - Right-Upper Extremity Strength Normal and Lower Extremity Strength Normal.  Lymphatic Head & Neck  General Head & Neck Lymphatics: Bilateral - Description - Normal. Axillary  General Axillary Region: Bilateral - Description - Normal. Tenderness - Non Tender. Femoral  & Inguinal  Generalized Femoral & Inguinal Lymphatics: Bilateral - Description - Normal. Tenderness - Non Tender.     ATYPICAL LOBULAR HYPERPLASIA OF LEFT BREAST (N60.92) Impression: The patient appears to have 2 areas of concern in the lower outer left breast. One area has pseudoangiomatous stromal hyperplasia in the other has atypical lobular hyperplasia. Both of these areas are considered high risk lesions. Because of her strong family history of breast cancer and would recommend that she be evaluated by genetics and the oncologists at the high-risk clinic prior to planning any surgical intervention. I will see her back once her evaluation is complete and we can talk about options for surgery. Current Plans Follow Up - Call [facility] office after tests / studies done to discuss further plans Referred to Genetic Counseling, for evaluation and follow up [facility]). Routine. Referred to Oncology, for evaluation and follow up (Oncology). Routine. Pt Education - Breast Diseases: discussed with patient and provided information.  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 She has increased risk of breast cancer. She has elected to have bilateral nipple sparing mastectomies with reconstruction. Risks and benefits of the surgery have been discussed with her in detail and she understands and wishes to proceed.

## 2016-03-31 NOTE — H&P (View-Only) (Signed)
Lynn Simpson is an 47 y.o. female.   Chief Complaint: left breast cancer HPI: The patient is a 46 y.o. yrs old wf here for evaluation of bilateral breasts.  She was diagnosed with LEFT breast fibrocystic changes with adenosis and calcifications, pseudoangiomatous stromal hyperplasia and lobular neoplasia.  This was diagnosed after a routing mammogram.  Now the area is tender from the biopsies.  She has children.  She is a smoker.  Her mother died at 89 from breast cancer and her sister had it as well.  She has mild ptosis of the breasts.  Her genetic testing has been submitted.  She is 5 feet 2 inches tall and weighs 250 pounds.  Preop bra = 42 B.  She would like to be bigger.  She is interested in bilateral mastectomies and keeping her nipples.   Past Medical History:  Diagnosis Date  . Anxiety   . Family history of breast cancer   . Family history of melanoma   . Family history of ovarian cancer   . Gallstones   . IBS (irritable bowel syndrome)   . Migraine   . Obesity   . Ovarian cyst     Past Surgical History:  Procedure Laterality Date  . CESAREAN SECTION  2005    Family History  Problem Relation Age of Onset  . Breast cancer Mother 9  . Liver cancer Mother   . Lung cancer Father 68  . Breast cancer Sister 6  . Breast cancer Maternal Grandmother   . Ovarian cancer Maternal Grandmother   . Colon cancer Maternal Grandmother   . Cancer Paternal Grandfather     NOS  . Melanoma Maternal Aunt     mother's maternal half sister   Social History:  reports that she has quit smoking. Her smoking use included Cigarettes. She has a 10.00 pack-year smoking history. She has never used smokeless tobacco. She reports that she drinks alcohol. She reports that she does not use drugs.  Allergies:  Allergies  Allergen Reactions  . Bee Venom   . Wellbutrin [Bupropion]      (Not in a hospital admission)  No results found for this or any previous visit (from the past 48  hour(s)). No results found.  Review of Systems  Constitutional: Negative.  Negative for fever.  HENT: Negative.   Eyes: Negative.   Respiratory: Negative.   Cardiovascular: Negative.   Gastrointestinal: Negative.  Negative for abdominal pain.  Genitourinary: Negative.   Musculoskeletal: Negative.   Skin: Negative.   Neurological: Negative.   Psychiatric/Behavioral: Negative.     There were no vitals taken for this visit. Physical Exam  Constitutional: She appears well-developed and well-nourished.  HENT:  Head: Normocephalic and atraumatic.  Eyes: Conjunctivae and EOM are normal. Pupils are equal, round, and reactive to light.  Cardiovascular: Normal rate.   Respiratory: Effort normal. She exhibits no tenderness.  GI: Soft. She exhibits no distension. There is no guarding.  Neurological: She is alert.  Skin: Skin is warm.  Psychiatric: She has a normal mood and affect. Her behavior is normal. Judgment and thought content normal.     Assessment/Plan Bilateral immediate breast reconstruction with expanders and FlexHD.  Wallace Going, DO 03/25/2016, 7:38 AM

## 2016-03-31 NOTE — Transfer of Care (Signed)
Immediate Anesthesia Transfer of Care Note  Patient: Lynn Simpson  Procedure(s) Performed: Procedure(s) (LRB): BILATERAL NIPPLE SPARING MASTECTOMIES (Bilateral) BILATERAL BREAST RECONSTRUCTION WITH PLACEMENT OF TISSUE EXPANDER AND FLEX HD (ACELLULAR HYDRATED DERMIS) (Bilateral)  Patient Location: PACU  Anesthesia Type: General  Level of Consciousness: awake, sedated, patient cooperative and responds to stimulation  Airway & Oxygen Therapy: Patient Spontanous Breathing and Patient connected to face mask oxygen  Post-op Assessment: Report given to PACU RN, Post -op Vital signs reviewed and stable and Patient moving all extremities  Post vital signs: Reviewed and stable  Complications: No apparent anesthesia complications

## 2016-03-31 NOTE — Anesthesia Postprocedure Evaluation (Signed)
Anesthesia Post Note  Patient: Lynn Simpson  Procedure(s) Performed: Procedure(s) (LRB): BILATERAL NIPPLE SPARING MASTECTOMIES (Bilateral) BILATERAL BREAST RECONSTRUCTION WITH PLACEMENT OF TISSUE EXPANDER AND FLEX HD (ACELLULAR HYDRATED DERMIS) (Bilateral)  Patient location during evaluation: PACU Anesthesia Type: General and Regional Level of consciousness: awake and alert Pain management: pain level controlled Vital Signs Assessment: post-procedure vital signs reviewed and stable Respiratory status: spontaneous breathing, nonlabored ventilation, respiratory function stable and patient connected to nasal cannula oxygen Cardiovascular status: blood pressure returned to baseline and stable Postop Assessment: no signs of nausea or vomiting Anesthetic complications: no       Last Vitals:  Vitals:   03/31/16 1437 03/31/16 1450  BP: 121/64 135/79  Pulse: 94 86  Resp: 19 18  Temp: 36.7 C 36.9 C    Last Pain:  Vitals:   03/31/16 1650  TempSrc:   PainSc: [place]

## 2016-04-01 ENCOUNTER — Encounter (HOSPITAL_BASED_OUTPATIENT_CLINIC_OR_DEPARTMENT_OTHER): Payer: Self-pay | Admitting: General Surgery

## 2016-04-01 DIAGNOSIS — Z803 Family history of malignant neoplasm of breast: Secondary | ICD-10-CM | POA: Diagnosis not present

## 2016-04-01 DIAGNOSIS — N6011 Diffuse cystic mastopathy of right breast: Secondary | ICD-10-CM | POA: Diagnosis not present

## 2016-04-01 DIAGNOSIS — Z1501 Genetic susceptibility to malignant neoplasm of breast: Secondary | ICD-10-CM | POA: Diagnosis not present

## 2016-04-01 DIAGNOSIS — F329 Major depressive disorder, single episode, unspecified: Secondary | ICD-10-CM | POA: Diagnosis not present

## 2016-04-01 DIAGNOSIS — N6012 Diffuse cystic mastopathy of left breast: Secondary | ICD-10-CM | POA: Diagnosis not present

## 2016-04-01 DIAGNOSIS — Z87891 Personal history of nicotine dependence: Secondary | ICD-10-CM | POA: Diagnosis not present

## 2016-04-01 DIAGNOSIS — N6022 Fibroadenosis of left breast: Secondary | ICD-10-CM | POA: Diagnosis not present

## 2016-04-01 DIAGNOSIS — D242 Benign neoplasm of left breast: Secondary | ICD-10-CM | POA: Diagnosis not present

## 2016-04-01 DIAGNOSIS — Z6841 Body Mass Index (BMI) 40.0 and over, adult: Secondary | ICD-10-CM | POA: Diagnosis not present

## 2016-04-01 DIAGNOSIS — Z888 Allergy status to other drugs, medicaments and biological substances status: Secondary | ICD-10-CM | POA: Diagnosis not present

## 2016-04-01 DIAGNOSIS — Z91030 Bee allergy status: Secondary | ICD-10-CM | POA: Diagnosis not present

## 2016-04-01 DIAGNOSIS — F419 Anxiety disorder, unspecified: Secondary | ICD-10-CM | POA: Diagnosis not present

## 2016-04-01 MED ORDER — HYDROCODONE-ACETAMINOPHEN 5-325 MG PO TABS
1.0000 | ORAL_TABLET | ORAL | 0 refills | Status: DC | PRN
Start: 2016-04-01 — End: 2016-09-16

## 2016-04-01 NOTE — Progress Notes (Signed)
Discharge Instructions given to patient and husband.  Understanding verbalized.  Drain education completed and demonstrated by family.  IV removed, belongings packed, and transportation arranged with husband.

## 2016-04-01 NOTE — Progress Notes (Signed)
1 Day Post-Op  Subjective: No complaints. Feels good  Objective: Vital signs in last 24 hours: Temp:  [97.3 F (36.3 C)-98.7 F (37.1 C)] 98.3 F (36.8 C) (01/12 0330) Pulse Rate:  [83-108] 83 (01/12 0330) Resp:  [16-26] 16 (01/12 0330) BP: (112-138)/(62-79) 127/69 (01/12 0330) SpO2:  [92 %-98 %] 97 % (01/12 0330)    Intake/Output from previous day: 01/11 0701 - 01/12 0700 In: 5333.3 [P.O.:1140; I.V.:3993.3; IV Piggyback:200] Out: [id number] [Urine:3800; Drains:382; Blood:200] Intake/Output this shift: No intake/output data recorded.  Resp: clear to auscultation bilaterally Chest wall: most of skin flaps look viable. some bruising Cardio: regular rate and rhythm GI: soft, non-tender; bowel sounds normal; no masses,  no organomegaly  Lab Results:   Recent Labs  03/31/16 0707  HGB 14.7   BMET No results for input(s): NA, K, CL, CO2, GLUCOSE, BUN, CREATININE, CALCIUM in the last 72 hours. PT/INR No results for input(s): LABPROT, INR in the last 72 hours. ABG No results for input(s): PHART, HCO3 in the last 72 hours.  Invalid input(s): PCO2, PO2  Studies/Results: No results found.  Anti-infectives: Anti-infectives    Start     Dose/Rate Route Frequency Ordered Stop   03/31/16 1500  ceFAZolin (ANCEF) IVPB 2g/100 mL premix     2 g 200 mL/hr over 30 Minutes Intravenous Every 8 hours 03/31/16 1457     03/31/16 0838  polymyxin B 500,000 Units, bacitracin 50,000 Units in sodium chloride irrigation 0.9 % 500 mL irrigation  Status:  Discontinued       As needed 03/31/16 0838 03/31/16 1335   03/31/16 0623  ceFAZolin (ANCEF) IVPB 2g/100 mL premix     2 g 200 mL/hr over 30 Minutes Intravenous On call to O.R. 03/31/16 [id number] 03/31/16 0831   03/31/16 [id number]  ceFAZolin (ANCEF) IVPB 2g/100 mL premix  Status:  Discontinued     2 g 200 mL/hr over 30 Minutes Intravenous On call to O.R. 03/31/16 0624 03/31/16 1457      Assessment/Plan: s/p Procedure(s): BILATERAL NIPPLE SPARING  MASTECTOMIES (Bilateral) BILATERAL BREAST RECONSTRUCTION WITH PLACEMENT OF TISSUE EXPANDER AND FLEX HD (ACELLULAR HYDRATED DERMIS) (Bilateral) Advance diet Discharge if ok with Plastics   LOS: 0 days    TOTH III,PAUL S 04/01/2016

## 2016-04-05 NOTE — Addendum Note (Signed)
Addendum  created 04/05/16 1132 by Tawni Millers, CRNA   Charge Capture section accepted

## 2016-04-22 ENCOUNTER — Encounter: Payer: BLUE CROSS/BLUE SHIELD | Admitting: Gastroenterology

## 2016-04-26 DIAGNOSIS — C50912 Malignant neoplasm of unspecified site of left female breast: Secondary | ICD-10-CM | POA: Diagnosis not present

## 2016-05-02 ENCOUNTER — Encounter (HOSPITAL_COMMUNITY): Payer: Self-pay

## 2016-05-06 ENCOUNTER — Encounter: Payer: Self-pay | Admitting: Gastroenterology

## 2016-05-16 ENCOUNTER — Telehealth: Payer: Self-pay | Admitting: Gastroenterology

## 2016-05-16 NOTE — Telephone Encounter (Signed)
Lynn Simpson saw pt and scheduled procedure

## 2016-05-16 NOTE — Telephone Encounter (Signed)
Left message on machine to call back  

## 2016-05-16 NOTE — Telephone Encounter (Signed)
Offered $50 coupon. At front desk for pick up. Patient was seen in December. Karin Golden primary for Tye Savoy NP.

## 2016-05-17 ENCOUNTER — Telehealth: Payer: Self-pay | Admitting: Gastroenterology

## 2016-05-17 NOTE — Telephone Encounter (Signed)
Spoke with pt and reviewed medications with her. Pt had questions regarding laying on her left side. She had a recent mastectomy and will need pillows on her left side. Informed pt to let the nurse and the doctor aware of the surgery. Pt understood

## 2016-05-18 ENCOUNTER — Ambulatory Visit (AMBULATORY_SURGERY_CENTER): Payer: BLUE CROSS/BLUE SHIELD | Admitting: Gastroenterology

## 2016-05-18 ENCOUNTER — Encounter: Payer: Self-pay | Admitting: Gastroenterology

## 2016-05-18 VITALS — BP 113/74 | HR 84 | Temp 98.7°F | Resp 25 | Ht 62.0 in | Wt 249.0 lb

## 2016-05-18 DIAGNOSIS — D124 Benign neoplasm of descending colon: Secondary | ICD-10-CM | POA: Diagnosis not present

## 2016-05-18 DIAGNOSIS — Z1509 Genetic susceptibility to other malignant neoplasm: Secondary | ICD-10-CM

## 2016-05-18 DIAGNOSIS — K219 Gastro-esophageal reflux disease without esophagitis: Secondary | ICD-10-CM

## 2016-05-18 DIAGNOSIS — D120 Benign neoplasm of cecum: Secondary | ICD-10-CM | POA: Diagnosis not present

## 2016-05-18 DIAGNOSIS — K209 Esophagitis, unspecified without bleeding: Secondary | ICD-10-CM

## 2016-05-18 DIAGNOSIS — K625 Hemorrhage of anus and rectum: Secondary | ICD-10-CM | POA: Diagnosis not present

## 2016-05-18 DIAGNOSIS — K449 Diaphragmatic hernia without obstruction or gangrene: Secondary | ICD-10-CM

## 2016-05-18 DIAGNOSIS — D123 Benign neoplasm of transverse colon: Secondary | ICD-10-CM | POA: Diagnosis not present

## 2016-05-18 DIAGNOSIS — D122 Benign neoplasm of ascending colon: Secondary | ICD-10-CM | POA: Diagnosis not present

## 2016-05-18 DIAGNOSIS — D125 Benign neoplasm of sigmoid colon: Secondary | ICD-10-CM

## 2016-05-18 MED ORDER — SODIUM CHLORIDE 0.9 % IV SOLN: 500.0000 mL | INTRAVENOUS | Status: DC

## 2016-05-18 NOTE — Patient Instructions (Signed)
YOU HAD AN ENDOSCOPIC PROCEDURE TODAY AT [facility]:   Refer to the procedure report that was given to you for any specific questions about what was found during the examination.  If the procedure report does not answer your questions, please call your gastroenterologist to clarify.  If you requested that your care partner not be given the details of your procedure findings, then the procedure report has been included in a sealed envelope for you to review at your convenience later.  YOU SHOULD EXPECT: Some feelings of bloating in the abdomen. Passage of more gas than usual.  Walking can help get rid of the air that was put into your GI tract during the procedure and reduce the bloating. If you had a lower endoscopy (such as a colonoscopy or flexible sigmoidoscopy) you may notice spotting of blood in your stool or on the toilet paper. If you underwent a bowel prep for your procedure, you may not have a normal bowel movement for a few days.  Please Note:  You might notice some irritation and congestion in your nose or some drainage.  This is from the oxygen used during your procedure.  There is no need for concern and it should clear up in a day or so.  SYMPTOMS TO REPORT IMMEDIATELY:   Following lower endoscopy (colonoscopy or flexible sigmoidoscopy):  Excessive amounts of blood in the stool  Significant tenderness or worsening of abdominal pains  Swelling of the abdomen that is new, acute  Fever of 100F or higher   Following upper endoscopy (EGD)  Vomiting of blood or coffee ground material  New chest pain or pain under the shoulder blades  Painful or persistently difficult swallowing  New shortness of breath  Fever of 100F or higher  Black, tarry-looking stools  For urgent or emergent issues, a gastroenterologist can be reached at any hour by calling [phone].   DIET:  We do recommend a small meal at first, but then you may proceed to your regular diet.  Drink  plenty of fluids but you should avoid alcoholic beverages for 24 hours.  ACTIVITY:  You should plan to take it easy for the rest of today and you should NOT DRIVE or use heavy machinery until tomorrow (because of the sedation medicines used during the test).    FOLLOW UP: Our staff will call the number listed on your records the next business day following your procedure to check on you and address any questions or concerns that you may have regarding the information given to you following your procedure. If we do not reach you, we will leave a message.  However, if you are feeling well and you are not experiencing any problems, there is no need to return our call.  We will assume that you have returned to your regular daily activities without incident.  If any biopsies were taken you will be contacted by phone or by letter within the next 1-3 weeks.  Please call us at [phone] if you have not heard about the biopsies in 3 weeks.    SIGNATURES/CONFIDENTIALITY: You and/or your care partner have signed paperwork which will be entered into your electronic medical record.  These signatures attest to the fact that that the information above on your After Visit Summary has been reviewed and is understood.  Full responsibility of the confidentiality of this discharge information lies with you and/or your care-partner.  Omeprazole 20mg . One pill 20-30 minutes prior to breakfast daily. Zantac  at bedtime.  Esophagitis, hiatal hernia, polyp, diverticulosis and high fiber information given,  Most probably will need colonoscopy in 1 year.

## 2016-05-18 NOTE — Op Note (Signed)
[facility] Patient Name: Lynn Simpson Procedure Date: 05/18/2016 10:32 AM MRN: [id number] Endoscopist: Milus Banister , MD Age: 46 Referring MD:  Date of Birth: 1971-03-06 Gender: Female Account #: [id number] Procedure:                Colonoscopy Indications:              Personal history of hereditary nonpolyposis                            colorectal cancer (PMS2 mutation, Lynch syndrome),                            personal h/o breast cancer Medicines:                Monitored Anesthesia Care Procedure:                Pre-Anesthesia Assessment:                           - Prior to the procedure, a History and Physical                            was performed, and patient medications and                            allergies were reviewed. The patient's tolerance of                            previous anesthesia was also reviewed. The risks                            and benefits of the procedure and the sedation                            options and risks were discussed with the patient.                            All questions were answered, and informed consent                            was obtained. Prior Anticoagulants: The patient has                            taken no previous anticoagulant or antiplatelet                            agents. ASA Grade Assessment: II - A patient with                            mild systemic disease. After reviewing the risks                            and benefits, the patient was deemed in  satisfactory condition to undergo the procedure.                           After obtaining informed consent, the colonoscope                            was passed under direct vision. Throughout the                            procedure, the patient's blood pressure, pulse, and                            oxygen saturations were monitored continuously. The                            Colonoscope was introduced through the  anus and                            advanced to the the cecum, identified by                            appendiceal orifice and ileocecal valve. The                            colonoscopy was performed without difficulty. The                            patient tolerated the procedure well. The quality                            of the bowel preparation was good. The ileocecal                            valve, appendiceal orifice, and rectum were                            photographed. Scope In: 10:37:50 AM Scope Out: 10:54:26 AM Scope Withdrawal Time: 0 hours 13 minutes 52 seconds  Total Procedure Duration: 0 hours 16 minutes 36 seconds  Findings:                 Two pedunculated polyps were found in the sigmoid                            colon. The polyps were 5 to 10 mm in size. These                            polyps were removed with a hot snare. Resection and                            retrieval were complete.                           Five sessile polyps were found in the sigmoid  colon, descending colon, transverse colon,                            ascending colon and cecum. The polyps were 3 to 6                            mm in size. These polyps were removed with a cold                            snare. Resection and retrieval were complete.                           Diverticulosis in the left colon.                           The exam was otherwise without abnormality on                            direct and retroflexion views. Complications:            No immediate complications. Estimated blood loss:                            None. Estimated Blood Loss:     Estimated blood loss: none. Impression:               - Two 5 to 10 mm polyps in the sigmoid colon,                            removed with a hot snare. Resected and retrieved.                           - Five 3 to 6 mm polyps in the sigmoid colon, in                            the descending  colon, in the transverse colon, in                            the ascending colon and in the cecum, removed with                            a cold snare. Resected and retrieved.                           - Diverticulosis.                           - The examination was otherwise normal on direct                            and retroflexion views. Recommendation:           - Patient has a contact number available for  emergencies. The signs and symptoms of potential                            delayed complications were discussed with the                            patient. Return to normal activities tomorrow.                            Written discharge instructions were provided to the                            patient.                           - Resume previous diet.                           - Continue present medications.                           - Repeat colonoscopy is recommended. The                            colonoscopy date will be determined after pathology                            results from today's exam become available for                            review. Almost certainly you will need repeat                            colonosopy in 1 year given your known Lynch                            Syndrome. Milus Banister, MD 05/18/2016 11:03:21 AM This report has been signed electronically.

## 2016-05-18 NOTE — Op Note (Signed)
[facility] Patient Name: Lynn Simpson Procedure Date: 05/18/2016 10:32 AM MRN: [id number] Endoscopist: Milus Banister , MD Age: 46 Referring MD:  Date of Birth: 1970/12/25 Gender: Female Account #: [id number] Procedure:                Upper GI endoscopy Indications:              Screening procedure (PMS2 mutation, known Lynch                            Syndrome), personal history of breast cancer. Medicines:                Monitored Anesthesia Care Procedure:                Pre-Anesthesia Assessment:                           - Prior to the procedure, a History and Physical                            was performed, and patient medications and                            allergies were reviewed. The patient's tolerance of                            previous anesthesia was also reviewed. The risks                            and benefits of the procedure and the sedation                            options and risks were discussed with the patient.                            All questions were answered, and informed consent                            was obtained. Prior Anticoagulants: The patient has                            taken no previous anticoagulant or antiplatelet                            agents. ASA Grade Assessment: II - A patient with                            mild systemic disease. After reviewing the risks                            and benefits, the patient was deemed in                            satisfactory condition to undergo the procedure.  After obtaining informed consent, the endoscope was                            passed under direct vision. Throughout the                            procedure, the patient's blood pressure, pulse, and                            oxygen saturations were monitored continuously. The                            Model GIF-[id number]) scope was introduced                            through the  mouth, and advanced to the second part                            of duodenum. The upper GI endoscopy was                            accomplished without difficulty. The patient                            tolerated the procedure well. Scope In: Scope Out: Findings:                 LA Grade B (one or more mucosal breaks greater than                            5 mm, not extending between the tops of two mucosal                            folds) esophagitis with no bleeding was found in                            the lower third of the esophagus.                           A small to medium-sized hiatal hernia was present.                           The examined duodenum was normal. Complications:            No immediate complications. Estimated blood loss:                            None. Estimated Blood Loss:     Estimated blood loss: none. Impression:               - LA Grade B reflux esophagitis.                           - Small to medium-sized hiatal hernia.                           -  Normal examined duodenum.                           - No specimens collected. Recommendation:           - Patient has a contact number available for                            emergencies. The signs and symptoms of potential                            delayed complications were discussed with the                            patient. Return to normal activities tomorrow.                            Written discharge instructions were provided to the                            patient.                           - Resume previous diet.                           - Continue present medications. Continue bedtime                            zantac, also start OTC 2m omeprazole, one pill                            20-30 min prior to breakfast meal daily.                           - Repeat upper endoscopy in 3 years for screening                            purposes. DMilus Banister MD 05/18/2016 11:08:13  AM This report has been signed electronically.

## 2016-05-18 NOTE — Progress Notes (Signed)
Called to room to assist during endoscopic procedure.  Patient ID and intended procedure confirmed with present staff. Received instructions for my participation in the procedure from the performing physician.  

## 2016-05-18 NOTE — Progress Notes (Signed)
Report to PACU, RN, vss, BBS= Clear.  

## 2016-05-19 ENCOUNTER — Encounter: Payer: Self-pay | Admitting: *Deleted

## 2016-05-19 ENCOUNTER — Telehealth: Payer: Self-pay

## 2016-05-19 NOTE — Telephone Encounter (Signed)
  Follow up Call-  Call back number 05/18/2016  Post procedure Call Back phone  # [phone]  Permission to leave phone message Yes  Some recent data might be hidden     Patient questions:  Do you have a fever, pain , or abdominal swelling? No. Pain Score  0 *  Have you tolerated food without any problems? Yes.    Have you been able to return to your normal activities? Yes.    Do you have any questions about your discharge instructions: Diet   No. Medications  No. Follow up visit  No.  Do you have questions or concerns about your Care? No.  Actions: * If pain score is 4 or above: No action needed, pain <4.

## 2016-05-23 NOTE — Telephone Encounter (Signed)
Call entered in error

## 2016-05-24 ENCOUNTER — Encounter: Payer: Self-pay | Admitting: Gastroenterology

## 2016-06-21 ENCOUNTER — Ambulatory Visit: Payer: Self-pay | Admitting: Plastic Surgery

## 2016-06-21 DIAGNOSIS — Z9010 Acquired absence of unspecified breast and nipple: Secondary | ICD-10-CM

## 2016-06-24 ENCOUNTER — Encounter (HOSPITAL_BASED_OUTPATIENT_CLINIC_OR_DEPARTMENT_OTHER): Payer: Self-pay | Admitting: *Deleted

## 2016-06-24 NOTE — Progress Notes (Signed)
Patient had surgery here in January for mastectomies, had anesthesia consult then for BMI 45.5. Cleared for surgery then by Dr Lissa Hoard. I did not bring pt back for repeat consult.

## 2016-06-30 ENCOUNTER — Encounter (HOSPITAL_BASED_OUTPATIENT_CLINIC_OR_DEPARTMENT_OTHER): Payer: Self-pay | Admitting: *Deleted

## 2016-06-30 ENCOUNTER — Ambulatory Visit (HOSPITAL_BASED_OUTPATIENT_CLINIC_OR_DEPARTMENT_OTHER): Payer: BLUE CROSS/BLUE SHIELD | Admitting: Anesthesiology

## 2016-06-30 ENCOUNTER — Encounter (HOSPITAL_BASED_OUTPATIENT_CLINIC_OR_DEPARTMENT_OTHER)
Admission: RE | Disposition: A | Discharge status: Home / Self Care | Payer: Self-pay | Source: Ambulatory Visit | Attending: Plastic Surgery

## 2016-06-30 ENCOUNTER — Ambulatory Visit (HOSPITAL_BASED_OUTPATIENT_CLINIC_OR_DEPARTMENT_OTHER)
Admission: RE | Admit: 2016-06-30 | Discharge: 2016-06-30 | Disposition: A | Discharge status: Home / Self Care | Payer: BLUE CROSS/BLUE SHIELD | Source: Ambulatory Visit | Attending: Plastic Surgery | Admitting: Plastic Surgery

## 2016-06-30 DIAGNOSIS — Z91030 Bee allergy status: Secondary | ICD-10-CM | POA: Diagnosis not present

## 2016-06-30 DIAGNOSIS — Z87891 Personal history of nicotine dependence: Secondary | ICD-10-CM | POA: Insufficient documentation

## 2016-06-30 DIAGNOSIS — Z6841 Body Mass Index (BMI) 40.0 and over, adult: Secondary | ICD-10-CM | POA: Insufficient documentation

## 2016-06-30 DIAGNOSIS — Z888 Allergy status to other drugs, medicaments and biological substances status: Secondary | ICD-10-CM | POA: Insufficient documentation

## 2016-06-30 DIAGNOSIS — Z800 Family history of malignant neoplasm of digestive organs: Secondary | ICD-10-CM | POA: Diagnosis not present

## 2016-06-30 DIAGNOSIS — F329 Major depressive disorder, single episode, unspecified: Secondary | ICD-10-CM | POA: Diagnosis not present

## 2016-06-30 DIAGNOSIS — K589 Irritable bowel syndrome without diarrhea: Secondary | ICD-10-CM | POA: Insufficient documentation

## 2016-06-30 DIAGNOSIS — N62 Hypertrophy of breast: Secondary | ICD-10-CM | POA: Diagnosis not present

## 2016-06-30 DIAGNOSIS — K219 Gastro-esophageal reflux disease without esophagitis: Secondary | ICD-10-CM | POA: Diagnosis not present

## 2016-06-30 DIAGNOSIS — K8020 Calculus of gallbladder without cholecystitis without obstruction: Secondary | ICD-10-CM | POA: Insufficient documentation

## 2016-06-30 DIAGNOSIS — N6082 Other benign mammary dysplasias of left breast: Secondary | ICD-10-CM | POA: Diagnosis not present

## 2016-06-30 DIAGNOSIS — G43909 Migraine, unspecified, not intractable, without status migrainosus: Secondary | ICD-10-CM | POA: Insufficient documentation

## 2016-06-30 DIAGNOSIS — Z853 Personal history of malignant neoplasm of breast: Secondary | ICD-10-CM | POA: Diagnosis not present

## 2016-06-30 DIAGNOSIS — F419 Anxiety disorder, unspecified: Secondary | ICD-10-CM | POA: Diagnosis not present

## 2016-06-30 DIAGNOSIS — Z421 Encounter for breast reconstruction following mastectomy: Secondary | ICD-10-CM | POA: Insufficient documentation

## 2016-06-30 DIAGNOSIS — Z79899 Other long term (current) drug therapy: Secondary | ICD-10-CM | POA: Insufficient documentation

## 2016-06-30 DIAGNOSIS — Z801 Family history of malignant neoplasm of trachea, bronchus and lung: Secondary | ICD-10-CM | POA: Insufficient documentation

## 2016-06-30 DIAGNOSIS — Z8041 Family history of malignant neoplasm of ovary: Secondary | ICD-10-CM | POA: Insufficient documentation

## 2016-06-30 DIAGNOSIS — N6481 Ptosis of breast: Secondary | ICD-10-CM | POA: Diagnosis not present

## 2016-06-30 DIAGNOSIS — Z9010 Acquired absence of unspecified breast and nipple: Secondary | ICD-10-CM

## 2016-06-30 DIAGNOSIS — Z808 Family history of malignant neoplasm of other organs or systems: Secondary | ICD-10-CM | POA: Diagnosis not present

## 2016-06-30 DIAGNOSIS — Z803 Family history of malignant neoplasm of breast: Secondary | ICD-10-CM | POA: Diagnosis not present

## 2016-06-30 DIAGNOSIS — Z9013 Acquired absence of bilateral breasts and nipples: Secondary | ICD-10-CM | POA: Diagnosis not present

## 2016-06-30 DIAGNOSIS — N6081 Other benign mammary dysplasias of right breast: Secondary | ICD-10-CM | POA: Diagnosis not present

## 2016-06-30 HISTORY — DX: Depression, unspecified: F32.A

## 2016-06-30 HISTORY — PX: REMOVAL OF BILATERAL TISSUE EXPANDERS WITH PLACEMENT OF BILATERAL BREAST IMPLANTS: SHX6431

## 2016-06-30 HISTORY — PX: LIPOSUCTION: SHX10

## 2016-06-30 HISTORY — DX: Gastro-esophageal reflux disease without esophagitis: K21.9

## 2016-06-30 HISTORY — DX: Malignant (primary) neoplasm, unspecified: C80.1

## 2016-06-30 HISTORY — DX: Major depressive disorder, single episode, unspecified: F32.9

## 2016-06-30 LAB — POCT HEMOGLOBIN-HEMACUE: Hemoglobin: 13.7 g/dL (ref 12.0–15.0)

## 2016-06-30 SURGERY — REMOVAL, TISSUE EXPANDER, BREAST, BILATERAL, WITH BILATERAL IMPLANT IMPLANT INSERTION
Anesthesia: General | Site: Breast | Laterality: Bilateral

## 2016-06-30 MED ORDER — LACTATED RINGERS IV SOLN
INTRAVENOUS | Status: DC
  Administered 2016-06-30 (×2): via INTRAVENOUS

## 2016-06-30 MED ORDER — ACETAMINOPHEN 325 MG PO TABS: 650.0000 mg | ORAL_TABLET | ORAL | Status: DC | PRN

## 2016-06-30 MED ORDER — SCOPOLAMINE 1 MG/3DAYS TD PT72: 1.0000 | MEDICATED_PATCH | Freq: Once | TRANSDERMAL | Status: DC | PRN

## 2016-06-30 MED ORDER — CHLORHEXIDINE GLUCONATE CLOTH 2 % EX PADS
6.0000 | MEDICATED_PAD | Freq: Once | CUTANEOUS | Status: DC
Start: 2016-06-30 — End: 2016-06-30

## 2016-06-30 MED ORDER — SODIUM CHLORIDE 0.9% FLUSH: 3.0000 mL | INTRAVENOUS | Status: DC | PRN

## 2016-06-30 MED ORDER — OXYCODONE HCL 5 MG PO TABS
5.0000 mg | ORAL_TABLET | ORAL | Status: DC | PRN
  Administered 2016-06-30: 5 mg via ORAL

## 2016-06-30 MED ORDER — EPINEPHRINE 30 MG/30ML IJ SOLN
INTRAMUSCULAR | Status: AC
  Filled 2016-06-30: qty 1

## 2016-06-30 MED ORDER — MIDAZOLAM HCL 2 MG/2ML IJ SOLN
1.0000 mg | INTRAMUSCULAR | Status: DC | PRN
  Administered 2016-06-30: 2 mg via INTRAVENOUS

## 2016-06-30 MED ORDER — LIDOCAINE-EPINEPHRINE 1 %-1:100000 IJ SOLN
INTRAMUSCULAR | Status: DC | PRN
  Administered 2016-06-30: 10 mL

## 2016-06-30 MED ORDER — FENTANYL CITRATE (PF) 100 MCG/2ML IJ SOLN: 50.0000 ug | INTRAMUSCULAR | Status: DC | PRN

## 2016-06-30 MED ORDER — OXYCODONE HCL 5 MG PO TABS
ORAL_TABLET | ORAL | Status: AC
  Filled 2016-06-30: qty 1

## 2016-06-30 MED ORDER — DEXAMETHASONE SODIUM PHOSPHATE 4 MG/ML IJ SOLN
INTRAMUSCULAR | Status: DC | PRN
  Administered 2016-06-30: 10 mg via INTRAVENOUS

## 2016-06-30 MED ORDER — CEFAZOLIN SODIUM-DEXTROSE 2-4 GM/100ML-% IV SOLN
INTRAVENOUS | Status: AC
  Filled 2016-06-30: qty 100

## 2016-06-30 MED ORDER — BUPIVACAINE HCL (PF) 0.5 % IJ SOLN
INTRAMUSCULAR | Status: AC
  Filled 2016-06-30: qty 30

## 2016-06-30 MED ORDER — ONDANSETRON HCL 4 MG/2ML IJ SOLN
INTRAMUSCULAR | Status: DC | PRN
  Administered 2016-06-30: 4 mg via INTRAVENOUS

## 2016-06-30 MED ORDER — LACTATED RINGERS IV SOLN
INTRAVENOUS | Status: DC | PRN
  Administered 2016-06-30: 1000 mL via INTRAVENOUS

## 2016-06-30 MED ORDER — PROMETHAZINE HCL 25 MG/ML IJ SOLN
6.2500 mg | Freq: Four times a day (QID) | INTRAMUSCULAR | Status: DC | PRN
  Administered 2016-06-30: 6.25 mg via INTRAVENOUS

## 2016-06-30 MED ORDER — SUCCINYLCHOLINE CHLORIDE 200 MG/10ML IV SOSY
PREFILLED_SYRINGE | INTRAVENOUS | Status: AC
  Filled 2016-06-30: qty 10

## 2016-06-30 MED ORDER — SUFENTANIL CITRATE 50 MCG/ML IV SOLN
INTRAVENOUS | Status: DC | PRN
  Administered 2016-06-30: 5 ug via INTRAVENOUS
  Administered 2016-06-30 (×2): 10 ug via INTRAVENOUS

## 2016-06-30 MED ORDER — PROMETHAZINE HCL 25 MG/ML IJ SOLN
INTRAMUSCULAR | Status: AC
  Filled 2016-06-30: qty 1

## 2016-06-30 MED ORDER — SODIUM CHLORIDE 0.9 % IV SOLN: 250.0000 mL | INTRAVENOUS | Status: DC | PRN

## 2016-06-30 MED ORDER — LIDOCAINE-EPINEPHRINE (PF) 1 %-1:200000 IJ SOLN
INTRAMUSCULAR | Status: AC
  Filled 2016-06-30: qty 30

## 2016-06-30 MED ORDER — SUCCINYLCHOLINE CHLORIDE 20 MG/ML IJ SOLN
INTRAMUSCULAR | Status: DC | PRN
  Administered 2016-06-30: 100 mg via INTRAVENOUS

## 2016-06-30 MED ORDER — SODIUM CHLORIDE 0.9 % IR SOLN
Status: DC | PRN
  Administered 2016-06-30: 500 mL

## 2016-06-30 MED ORDER — LIDOCAINE HCL (CARDIAC) 20 MG/ML IV SOLN
INTRAVENOUS | Status: DC | PRN
  Administered 2016-06-30 (×2): 50 mg via INTRAVENOUS

## 2016-06-30 MED ORDER — CEFAZOLIN SODIUM-DEXTROSE 2-4 GM/100ML-% IV SOLN
2.0000 g | INTRAVENOUS | Status: AC
  Administered 2016-06-30: 2 g via INTRAVENOUS

## 2016-06-30 MED ORDER — EPINEPHRINE PF 1 MG/ML IJ SOLN
INTRAMUSCULAR | Status: DC | PRN
  Administered 2016-06-30: 1 mL

## 2016-06-30 MED ORDER — SODIUM CHLORIDE 0.9% FLUSH: 3.0000 mL | Freq: Two times a day (BID) | INTRAVENOUS | Status: DC

## 2016-06-30 MED ORDER — LIDOCAINE HCL (PF) 1 % IJ SOLN
INTRAMUSCULAR | Status: AC
  Filled 2016-06-30: qty 60

## 2016-06-30 MED ORDER — LIDOCAINE 2% (20 MG/ML) 5 ML SYRINGE
INTRAMUSCULAR | Status: AC
  Filled 2016-06-30: qty 5

## 2016-06-30 MED ORDER — ACETAMINOPHEN 650 MG RE SUPP: 650.0000 mg | RECTAL | Status: DC | PRN

## 2016-06-30 MED ORDER — EPHEDRINE 5 MG/ML INJ
INTRAVENOUS | Status: AC
  Filled 2016-06-30: qty 10

## 2016-06-30 MED ORDER — MIDAZOLAM HCL 2 MG/2ML IJ SOLN
INTRAMUSCULAR | Status: AC
  Filled 2016-06-30: qty 2

## 2016-06-30 MED ORDER — LIDOCAINE HCL 1 % IJ SOLN
INTRAMUSCULAR | Status: DC | PRN
  Administered 2016-06-30: 50 mL

## 2016-06-30 MED ORDER — PROPOFOL 10 MG/ML IV BOLUS
INTRAVENOUS | Status: DC | PRN
  Administered 2016-06-30: 200 mg via INTRAVENOUS

## 2016-06-30 MED ORDER — SUFENTANIL CITRATE 50 MCG/ML IV SOLN
INTRAVENOUS | Status: AC
  Filled 2016-06-30: qty 1

## 2016-06-30 SURGICAL SUPPLY — 78 items
ADH SKN CLS APL DERMABOND .7 (GAUZE/BANDAGES/DRESSINGS) ×2
BAG DECANTER FOR FLEXI CONT (MISCELLANEOUS) ×2 IMPLANT
BINDER ABDOMINAL  9 SM 30-45 (SOFTGOODS)
BINDER ABDOMINAL 10 UNV 27-48 (MISCELLANEOUS) IMPLANT
BINDER ABDOMINAL 12 ML 46-62 (SOFTGOODS) IMPLANT
BINDER ABDOMINAL 9 SM 30-45 (SOFTGOODS) IMPLANT
BINDER BREAST LRG (GAUZE/BANDAGES/DRESSINGS) IMPLANT
BINDER BREAST MEDIUM (GAUZE/BANDAGES/DRESSINGS) IMPLANT
BINDER BREAST XLRG (GAUZE/BANDAGES/DRESSINGS) IMPLANT
BINDER BREAST XXLRG (GAUZE/BANDAGES/DRESSINGS) ×1 IMPLANT
BIOPATCH RED 1 DISK 7.0 (GAUZE/BANDAGES/DRESSINGS) IMPLANT
BLADE HEX COATED 2.75 (ELECTRODE) ×2 IMPLANT
BLADE SURG 15 STRL LF DISP TIS (BLADE) ×2 IMPLANT
BLADE SURG 15 STRL SS (BLADE) ×6
BNDG GAUZE ELAST 4 BULKY (GAUZE/BANDAGES/DRESSINGS) ×4 IMPLANT
CANISTER SUCT 1200ML W/VALVE (MISCELLANEOUS) ×2 IMPLANT
CHLORAPREP W/TINT 26ML (MISCELLANEOUS) ×3 IMPLANT
CORDS BIPOLAR (ELECTRODE) IMPLANT
COVER BACK TABLE 60X90IN (DRAPES) ×2 IMPLANT
COVER MAYO STAND STRL (DRAPES) ×2 IMPLANT
DECANTER SPIKE VIAL GLASS SM (MISCELLANEOUS) IMPLANT
DERMABOND ADVANCED (GAUZE/BANDAGES/DRESSINGS) ×2
DERMABOND ADVANCED .7 DNX12 (GAUZE/BANDAGES/DRESSINGS) IMPLANT
DRAIN CHANNEL 19F RND (DRAIN) IMPLANT
DRAPE LAPAROSCOPIC ABDOMINAL (DRAPES) ×2 IMPLANT
DRSG PAD ABDOMINAL 8X10 ST (GAUZE/BANDAGES/DRESSINGS) ×4 IMPLANT
ELECT BLADE 4.0 EZ CLEAN MEGAD (MISCELLANEOUS) ×2
ELECT REM PT RETURN 9FT ADLT (ELECTROSURGICAL) ×2
ELECTRODE BLDE 4.0 EZ CLN MEGD (MISCELLANEOUS) ×1 IMPLANT
ELECTRODE REM PT RTRN 9FT ADLT (ELECTROSURGICAL) ×1 IMPLANT
EVACUATOR SILICONE 100CC (DRAIN) IMPLANT
FILTER LIPOSUCTION (MISCELLANEOUS) ×2 IMPLANT
GAUZE SPONGE 4X4 12PLY STRL LF (GAUZE/BANDAGES/DRESSINGS) IMPLANT
GLOVE BIO SURGEON STRL SZ 6.5 (GLOVE) ×7 IMPLANT
GLOVE BIO SURGEON STRL SZ7 (GLOVE) ×1 IMPLANT
GOWN STRL REUS W/ TWL LRG LVL3 (GOWN DISPOSABLE) ×2 IMPLANT
GOWN STRL REUS W/TWL LRG LVL3 (GOWN DISPOSABLE) ×4
IMPL GEL SMOOTH RND 700CC (Breast) IMPLANT
IMPLANT GEL SMOOTH RND 700CC (Breast) ×4 IMPLANT
IV NS 1000ML (IV SOLUTION)
IV NS 1000ML BAXH (IV SOLUTION) IMPLANT
IV NS 500ML (IV SOLUTION)
IV NS 500ML BAXH (IV SOLUTION) IMPLANT
KIT FILL SYSTEM UNIVERSAL (SET/KITS/TRAYS/PACK) IMPLANT
LINER CANISTER 1000CC FLEX (MISCELLANEOUS) ×2 IMPLANT
NDL HYPO 25X1 1.5 SAFETY (NEEDLE) IMPLANT
NDL SAFETY ECLIPSE 18X1.5 (NEEDLE) ×1 IMPLANT
NEEDLE HYPO 18GX1.5 SHARP (NEEDLE) ×2
NEEDLE HYPO 25X1 1.5 SAFETY (NEEDLE) ×2 IMPLANT
NS IRRIG 1000ML POUR BTL (IV SOLUTION) ×1 IMPLANT
PACK BASIN DAY SURGERY FS (CUSTOM PROCEDURE TRAY) ×2 IMPLANT
PAD ALCOHOL SWAB (MISCELLANEOUS) ×2 IMPLANT
PENCIL BUTTON HOLSTER BLD 10FT (ELECTRODE) ×2 IMPLANT
PIN SAFETY STERILE (MISCELLANEOUS) IMPLANT
SIZER BREAST GEL HP 700CC (SIZER) ×2
SIZER BREAST REUSE 650CC (SIZER) ×2
SIZER BRST GEL HP 700CC (SIZER) IMPLANT
SIZER BRST REUSE P6.4 650CC (SIZER) IMPLANT
SLEEVE SCD COMPRESS KNEE MED (MISCELLANEOUS) ×2 IMPLANT
SPONGE LAP 18X18 X RAY DECT (DISPOSABLE) ×5 IMPLANT
SUT MNCRL AB 4-0 PS2 18 (SUTURE) ×3 IMPLANT
SUT MON AB 3-0 SH 27 (SUTURE) ×2
SUT MON AB 3-0 SH27 (SUTURE) ×1 IMPLANT
SUT MON AB 5-0 PS2 18 (SUTURE) ×4 IMPLANT
SUT PDS AB 2-0 CT2 27 (SUTURE) IMPLANT
SUT VIC AB 3-0 SH 27 (SUTURE) ×8
SUT VIC AB 3-0 SH 27X BRD (SUTURE) IMPLANT
SUT VICRYL 4-0 PS2 18IN ABS (SUTURE) ×2 IMPLANT
SYR 50ML LL SCALE MARK (SYRINGE) ×1 IMPLANT
SYR BULB IRRIGATION 50ML (SYRINGE) ×2 IMPLANT
SYR CONTROL 10ML LL (SYRINGE) ×1 IMPLANT
SYR TB 1ML LL NO SAFETY (SYRINGE) ×2 IMPLANT
TOWEL OR 17X24 6PK STRL BLUE (TOWEL DISPOSABLE) ×4 IMPLANT
TUBE CONNECTING 20X1/4 (TUBING) ×2 IMPLANT
TUBING INFILTRATION IT-10001 (TUBING) ×1 IMPLANT
TUBING SET GRADUATE ASPIR 12FT (MISCELLANEOUS) ×2 IMPLANT
UNDERPAD 30X30 (UNDERPADS AND DIAPERS) ×4 IMPLANT
YANKAUER SUCT BULB TIP NO VENT (SUCTIONS) ×2 IMPLANT

## 2016-06-30 NOTE — Op Note (Signed)
Op report Bilateral Exchange   DATE OF OPERATION: 06/30/2016  LOCATION: [facility]  SURGICAL DIVISION: Plastic Surgery  PREOPERATIVE DIAGNOSES:  1.History of left breast cancer.  2. Acquired absence of bilateral breast.   POSTOPERATIVE DIAGNOSES:  1. History of left breast cancer.  2. Acquired absence of bilateral breast.   PROCEDURE:  1. Bilateral exchange of tissue expanders for implants.  2. Bilateral capsulotomies for implant respositioning.  SURGEON: Claire Sanger Dillingham, DO  ASSISTANT: Shawn Rayburn, PA  ANESTHESIA:  General.   COMPLICATIONS: None.   IMPLANTS: Left - Mentor Smooth Round Ultra High Profile Gel 700cc. Ref #[id number].  Serial Number [id number] Right - Mentor Smooth Round Ultra High Profile Gel 700cc. Ref #[id number].  Serial Number [id number]  INDICATIONS FOR PROCEDURE:  The patient, Lynn Simpson, is a 46 y.o. female born on 04-04-1970, is here for treatment after bilateral nipple sparing mastectomies.  She had tissue expanders placed at the time of mastectomies. She now presents for exchange of her expanders for implants.  She requires capsulotomies to better position the implants. MRN: [id number]  CONSENT:  Informed consent was obtained directly from the patient. Risks, benefits and alternatives were fully discussed. Specific risks including but not limited to bleeding, infection, hematoma, seroma, scarring, pain, implant infection, implant extrusion, capsular contracture, asymmetry, wound healing problems, and need for further surgery were all discussed. The patient did have an ample opportunity to have her questions answered to her satisfaction.   DESCRIPTION OF PROCEDURE:  The patient was taken to the operating room. SCDs were placed and IV antibiotics were given. The patient's chest was prepped and draped in a sterile fashion. A time out was performed and the implants to be used were identified.  Tumescent was placed in  the lateral breast / axillary area on both sides.  On the right breast: One percent Lidocaine with epinephrine was used to infiltrate at the incision site. The old mastectomy scar was excised.  Liposuction was done on the lateral breast / axillary area. The mastectomy flaps from the superior and inferior flaps were raised over the pectoralis major muscle for several centimeters to minimize tension for the closure. The pectoralis was split inferior to the skin incision to expose and remove the tissue expander.  Inspection of the pocket showed a normal healthy capsule and good integration of the biologic matrix.  The pocket was irrigated with antibiotic solution.  Circumferential capsulotomies were performed to allow for breast pocket expansion.  Measurements were made and a sizer used to confirm adequate pocket size for the implant dimensions.  Hemostasis was ensured with electrocautery. New gloves were placed. The implant was soaked in antibiotic solution and then placed in the pocket and oriented appropriately. The pectoralis major muscle and capsule on the anterior surface were re-closed with a 3-0 Monocryl suture. The remaining skin was closed with 4-0 Monocryl deep dermal and 5-0 Monocryl subcuticular stitches.   On the left breast: The old mastectomy scar was excised.  Liposuction was done on the lateral breast / axillary area. TThe mastectomy flaps from the superior and inferior flaps were raised over the pectoralis major muscle for several centimeters to minimize tension for the closure. The pectoralis was split inferior to the skin incision to expose and remove the tissue expander.  Inspection of the pocket showed a normal healthy capsule and good integration of the biologic matrix.   Circumferential capsulotomies were performed to allow for breast pocket expansion.  Measurements were made and a sizer utilized  to confirm adequate pocket size for the implant dimensions.  Hemostasis was ensured with  the electrocautery.  New gloves were applied. The implant was soaked in antibiotic solution and placed in the pocket and oriented appropriately. The pectoralis major muscle and capsule on the anterior surface were re-closed with a 3-0 Monocryl suture. The remaining skin was closed with 4-0 Monocryl deep dermal and 5-0 Monocryl subcuticular stitches.  Dermabond was applied to the incision site. A breast binder and ABDs were placed.  The patient was allowed to wake from anesthesia and taken to the recovery room in satisfactory condition.

## 2016-06-30 NOTE — Anesthesia Postprocedure Evaluation (Addendum)
Anesthesia Post Note  Patient: ROKIA BOSKET  Procedure(s) Performed: Procedure(s) (LRB): REMOVAL OF BILATERAL TISSUE EXPANDERS WITH PLACEMENT OF BILATERAL SILCONE BREAST IMPLANTS (Bilateral) LIPOSUCTION TO LATERAL CHEST (Bilateral)  Patient location during evaluation: PACU Anesthesia Type: General Level of consciousness: awake and alert Pain management: pain level controlled Vital Signs Assessment: post-procedure vital signs reviewed and stable Respiratory status: spontaneous breathing, nonlabored ventilation, respiratory function stable and patient connected to nasal cannula oxygen Cardiovascular status: blood pressure returned to baseline and stable Postop Assessment: no signs of nausea or vomiting Anesthetic complications: no       Last Vitals:  Vitals:   06/30/16 1103 06/30/16 1125  BP: 129/81   Pulse: 97 97  Resp: 12 12  Temp: 36.9 C 36.9 C    Last Pain:  Vitals:   06/30/16 1125  TempSrc: Oral  PainSc: 2                  Fletcher Rathbun,JAMES TERRILL

## 2016-06-30 NOTE — Discharge Instructions (Signed)
May shower No heavy lifting Continue sports bra or binder  Call your surgeon if you experience:   1.  Fever over 101.0. 2.  Inability to urinate. 3.  Nausea and/or vomiting. 4.  Extreme swelling or bruising at the surgical site. 5.  Continued bleeding from the incision. 6.  Increased pain, redness or drainage from the incision. 7.  Problems related to your pain medication. 8.  Any problems and/or concerns   Post Anesthesia Home Care Instructions  Activity: Get plenty of rest for the remainder of the day. A responsible individual must stay with you for 24 hours following the procedure.  For the next 24 hours, DO NOT: -Drive a car -Paediatric nurse -Drink alcoholic beverages -Take any medication unless instructed by your physician -Make any legal decisions or sign important papers.  Meals: Start with liquid foods such as gelatin or soup. Progress to regular foods as tolerated. Avoid greasy, spicy, heavy foods. If nausea and/or vomiting occur, drink only clear liquids until the nausea and/or vomiting subsides. Call your physician if vomiting continues.  Special Instructions/Symptoms: Your throat may feel dry or sore from the anesthesia or the breathing tube placed in your throat during surgery. If this causes discomfort, gargle with warm salt water. The discomfort should disappear within 24 hours.  If you had a scopolamine patch placed behind your ear for the management of post- operative nausea and/or vomiting:  1. The medication in the patch is effective for 72 hours, after which it should be removed.  Wrap patch in a tissue and discard in the trash. Wash hands thoroughly with soap and water. 2. You may remove the patch earlier than 72 hours if you experience unpleasant side effects which may include dry mouth, dizziness or visual disturbances. 3. Avoid touching the patch. Wash your hands with soap and water after contact with the patch.

## 2016-06-30 NOTE — Anesthesia Preprocedure Evaluation (Signed)
Anesthesia Evaluation  Patient identified by MRN, date of birth, ID band Patient awake    Reviewed: Allergy & Precautions, NPO status , Patient's Chart, lab work & pertinent test results  Airway Mallampati: II  TM Distance: >3 FB Neck ROM: Full    Dental  (+) Teeth Intact   Pulmonary neg pulmonary ROS, former smoker,    breath sounds clear to auscultation       Cardiovascular negative cardio ROS   Rhythm:Regular Rate:Normal     Neuro/Psych negative neurological ROS  negative psych ROS   GI/Hepatic negative GI ROS, Neg liver ROS, GERD  ,  Endo/Other  Morbid obesity  Renal/GU negative Renal ROS  negative genitourinary   Musculoskeletal negative musculoskeletal ROS (+)   Abdominal (+) + obese,   Peds negative pediatric ROS (+)  Hematology negative hematology ROS (+)   Anesthesia Other Findings   Reproductive/Obstetrics negative OB ROS                             Anesthesia Physical Anesthesia Plan  ASA: II  Anesthesia Plan: General   Post-op Pain Management:    Induction: Intravenous  Airway Management Planned: Oral ETT  Additional Equipment:   Intra-op Plan:   Post-operative Plan: Extubation in OR  Informed Consent: I have reviewed the patients History and Physical, chart, labs and discussed the procedure including the risks, benefits and alternatives for the proposed anesthesia with the patient or authorized representative who has indicated his/her understanding and acceptance.     Plan Discussed with: CRNA  Anesthesia Plan Comments:         Anesthesia Quick Evaluation

## 2016-06-30 NOTE — H&P (Signed)
Lynn Simpson is an 46 y.o. female.   Chief Complaint: acquired absence of breast HPI: The patient is a 46 y.o. yrs old wf here for breast reconstruction.  The expanders are in and she is pleased with the size.  She would like to be at least this size.  There is no sign of infection and no fluid retention.  All sites are well healed.  The right is slightly higher than the left side.  History: She was diagnosed with LEFT breast fibrocystic changes with adenosis and calcifications, pseudoangiomatous stromal hyperplasia and lobular neoplasia.  This was diagnosed after a routing mammogram.  Now the area is tender from the biopsies.  She has children.  She is a smoker.  Her mother died at 29 from breast cancer and her sister had it as well.  She has mild ptosis of the breasts.  Her genetic testing has been submitted.  She is 5 feet 2 inches tall and weighs 250 pounds.  Preop bra = 42 B.     Past Medical History:  Diagnosis Date  . Anxiety   . Cancer ([place])   . Depression   . Family history of breast cancer   . Family history of melanoma   . Family history of ovarian cancer   . Gallstones   . GERD (gastroesophageal reflux disease)   . IBS (irritable bowel syndrome)   . Migraine   . Obesity   . Ovarian cyst     Past Surgical History:  Procedure Laterality Date  . BREAST RECONSTRUCTION WITH PLACEMENT OF TISSUE EXPANDER AND FLEX HD (ACELLULAR HYDRATED DERMIS) Bilateral 03/31/2016   Procedure: BILATERAL BREAST RECONSTRUCTION WITH PLACEMENT OF TISSUE EXPANDER AND FLEX HD (ACELLULAR HYDRATED DERMIS);  Surgeon: Wallace Going, DO;  Location: [facility];  Service: Plastics;  Laterality: Bilateral;  . CESAREAN SECTION  2005  . NIPPLE SPARING MASTECTOMY Bilateral 03/31/2016   Procedure: BILATERAL NIPPLE SPARING MASTECTOMIES;  Surgeon: Autumn Messing III, MD;  Location: [facility];  Service: General;  Laterality: Bilateral;    Family History  Problem Relation Age of Onset   . Breast cancer Mother 43  . Liver cancer Mother   . Lung cancer Father 13  . Breast cancer Sister 13  . Breast cancer Maternal Grandmother   . Ovarian cancer Maternal Grandmother   . Colon cancer Maternal Grandmother   . Cancer Paternal Grandfather     NOS  . Melanoma Maternal Aunt     mother's maternal half sister   Social History:  reports that she quit smoking about 6 months ago. Her smoking use included Cigarettes. She has a 10.00 pack-year smoking history. She has never used smokeless tobacco. She reports that she drinks alcohol. She reports that she does not use drugs.  Allergies:  Allergies  Allergen Reactions  . Wellbutrin [Bupropion] Anaphylaxis  . Bee Venom     Medications Prior to Admission  Medication Sig Dispense Refill  . clonazePAM (KLONOPIN) 0.5 MG tablet Take 0.5 mg by mouth 2 (two) times daily.  0  . diazepam (VALIUM) 10 MG tablet Take 10 mg by mouth.    Marland Kitchen ibuprofen (ADVIL,MOTRIN) 800 MG tablet Take 800 mg by mouth every 8 (eight) hours as needed.    . ranitidine (ZANTAC 75) 75 MG tablet Take 1 tablet (75 mg total) by mouth at bedtime. 30 tablet 1  . sertraline (ZOLOFT) 100 MG tablet Take 150 mg by mouth.    Marland Kitchen HYDROcodone-acetaminophen (NORCO/VICODIN) 5-325 MG tablet  Take 1-2 tablets by mouth every 4 (four) hours as needed for moderate pain. 30 tablet 0    Results for orders placed or performed during the hospital encounter of 06/30/16 (from the past 48 hour(s))  Hemoglobin-hemacue, POC     Status: None   Collection Time: 06/30/16  6:46 AM  Result Value Ref Range   Hemoglobin 13.7 12.0 - 15.0 g/dL   No results found.  Review of Systems  Constitutional: Negative.   HENT: Negative.   Eyes: Negative.   Respiratory: Negative.   Cardiovascular: Negative.   Gastrointestinal: Negative.   Genitourinary: Negative.   Musculoskeletal: Negative.   Skin: Negative.   Neurological: Negative.   Psychiatric/Behavioral: Negative.     Blood pressure 129/66,  pulse 86, temperature 98.1 F (36.7 C), temperature source Oral, resp. rate 18, height 5\' 2"  (1.575 m), weight 115.3 kg (254 lb 3.2 oz), SpO2 97 %. Physical Exam  Constitutional: She is oriented to person, place, and time. She appears well-developed and well-nourished.  HENT:  Head: Normocephalic and atraumatic.  Eyes: EOM are normal. Pupils are equal, round, and reactive to light.  Cardiovascular: Normal rate.   Respiratory: Effort normal. No respiratory distress.  GI: Soft. She exhibits no distension.  Musculoskeletal: Normal range of motion.  Neurological: She is alert and oriented to person, place, and time.  Skin: Skin is warm. No rash noted. No erythema. No pallor.  Psychiatric: She has a normal mood and affect. Her behavior is normal. Judgment and thought content normal.     Assessment/Plan She has a total of 620/350 cc on each side.  Plan for removal of the expanders and placement of the silicone implants.  Risks and complications were reviewed and include bleeding, pain, scar, risk of anesthesia.   Wallace Going, DO 06/30/2016, 7:17 AM

## 2016-06-30 NOTE — Transfer of Care (Signed)
Immediate Anesthesia Transfer of Care Note  Patient: Lynn Simpson  Procedure(s) Performed: Procedure(s): REMOVAL OF BILATERAL TISSUE EXPANDERS WITH PLACEMENT OF BILATERAL SILCONE BREAST IMPLANTS (Bilateral) LIPOSUCTION TO LATERAL CHEST (Bilateral)  Patient Location: PACU  Anesthesia Type:General  Level of Consciousness: awake, alert  and oriented  Airway & Oxygen Therapy: Patient Spontanous Breathing and Patient connected to face mask oxygen  Post-op Assessment: Report given to RN and Post -op Vital signs reviewed and stable  Post vital signs: Reviewed and stable  Last Vitals:  Vitals:   06/30/16 0634 06/30/16 0957  BP: 129/66 139/75  Pulse: 86 (!) 134  Resp: 18 (P) 19  Temp: 36.7 C (P) 36.9 C    Last Pain:  Vitals:   06/30/16 0634  TempSrc: Oral      Patients Stated Pain Goal: 0 (85/92/92 4462)  Complications: No apparent anesthesia complications

## 2016-06-30 NOTE — Anesthesia Procedure Notes (Signed)
Procedure Name: Intubation Date/Time: 06/30/2016 7:39 AM Performed by: Melynda Ripple D Pre-anesthesia Checklist: Patient identified, Emergency Drugs available, Suction available and Patient being monitored Patient Re-evaluated:Patient Re-evaluated prior to inductionOxygen Delivery Method: Circle system utilized Preoxygenation: Pre-oxygenation with 100% oxygen Intubation Type: IV induction Ventilation: Mask ventilation without difficulty Laryngoscope Size: Mac and 3 Grade View: Grade I Tube type: Oral Tube size: 7.0 mm Number of attempts: 1 Airway Equipment and Method: Stylet and Oral airway Placement Confirmation: ETT inserted through vocal cords under direct vision,  positive ETCO2 and breath sounds checked- equal and bilateral Secured at: 22 cm Tube secured with: Tape Dental Injury: Teeth and Oropharynx as per pre-operative assessment

## 2016-07-01 ENCOUNTER — Encounter (HOSPITAL_BASED_OUTPATIENT_CLINIC_OR_DEPARTMENT_OTHER): Payer: Self-pay | Admitting: Plastic Surgery

## 2016-07-05 DIAGNOSIS — C50912 Malignant neoplasm of unspecified site of left female breast: Secondary | ICD-10-CM | POA: Diagnosis not present

## 2016-08-16 ENCOUNTER — Telehealth: Payer: Self-pay | Admitting: Gastroenterology

## 2016-08-16 NOTE — Telephone Encounter (Signed)
Pt states she had some rectal bleeding yesterday from her hemorrhoids. Reports she wiped and saw clots, concerned because this has not happened before. Pt has not seen any bleeding today. States she is quite swollen in the rectal area today. Please advise.

## 2016-08-18 NOTE — Telephone Encounter (Signed)
Bleeding has not returned.  One episode of small clots then red blood on TP for 2-3 days.  Raw at her backside but that has stopped also.  Likely hemorrhoidal.  She does not have significant constipation. She'll call if symptoms return.

## 2016-08-19 NOTE — Addendum Note (Signed)
Addendum  created 08/19/16 1342 by Rica Koyanagi, MD   Sign clinical note

## 2016-08-22 NOTE — H&P (Signed)
[facility]  DICTATION # [id number] CSN# [id number]   Margarette Asal, MD 08/22/2016 2:02 PM

## 2016-08-23 NOTE — H&P (Signed)
NAMEESSYNCE, MUNSCH NO.:  [id number]  MEDICAL RECORD NO.:  [id number]  LOCATION:                                 FACILITY:  PHYSICIAN:  Ralene Bathe. Matthew Saras, M.D.    DATE OF BIRTH:  DATE OF ADMISSION: DATE OF DISCHARGE:                             HISTORY & PHYSICAL   CHIEF COMPLAINT:  History of Lynch syndrome, for scheduled LAVH-BSO.  HISTORY OF PRESENT ILLNESS:  A 46 year old, G3, P3, 1 prior section, 2 vaginal deliveries.  In January 2018, she had bilateral mastectomy with reconstruction, BSO was recommended because of history of positive screen for Lynch syndrome.  This procedure including specific risks related to bleeding, infection, transfusion, adjacent organ injury, wound infection, phlebitis, the possible need to complete surgery by open technique; all discussed with her which she understands and accepts.  PAST MEDICAL HISTORY:  ALLERGIES:  WELLBUTRIN.  CURRENT MEDICATIONS:  Sertraline.  REVIEW OF THE FAMILY HISTORY:  Significant for history of breast, liver, and lung cancer.  PAST SURGICAL HISTORY:  Again, she has had 2 vaginal deliveries, 1 C- section.  Only other surgeries hysteroscopy, D and C in 2016.  SOCIAL HISTORY:  Smokes one-half PPD.  She is married.  Social alcohol use.  Denies drug use.  PHYSICAL EXAMINATION:  VITAL SIGNS:  Temperature 98.2, blood pressure 130/78. HEENT:  Unremarkable. NECK:  Supple without masses. LUNGS:  Clear. CARDIOVASCULAR:  Regular rate and rhythm, without murmurs, rubs, gallops. BREASTS:  She has had bilateral mastectomy reconstruction. ABDOMEN:  Soft, flat, nontender. GU:  Vulva, vagina, cervix normal.  Uterus was upper limit of normal size.  Adnexa negative. EXTREMITIES:  Unremarkable. NEUROLOGIC:  Unremarkable.  Of note, July 2017, had a pelvic CT that was read out as normal.  IMPRESSION:  History of Lynch syndrome, at risk for endometrial/ovarian cancer.  PLAN:  LAVH-BSO.  Procedure and  risks discussed as above.     Kesi Perrow M. Matthew Saras, M.D.   ______________________________ Ralene Bathe. Matthew Saras, M.D.    [facility]/MEDQ  D:  08/22/2016  T:  08/22/2016  Job:  [id number]

## 2016-09-07 ENCOUNTER — Encounter (HOSPITAL_COMMUNITY): Payer: Self-pay

## 2016-09-07 ENCOUNTER — Encounter (HOSPITAL_COMMUNITY)
Admission: RE | Admit: 2016-09-07 | Discharge: 2016-09-07 | Disposition: A | Discharge status: Home / Self Care | Payer: BLUE CROSS/BLUE SHIELD | Source: Ambulatory Visit | Attending: Obstetrics and Gynecology | Admitting: Obstetrics and Gynecology

## 2016-09-07 DIAGNOSIS — F419 Anxiety disorder, unspecified: Secondary | ICD-10-CM | POA: Insufficient documentation

## 2016-09-07 DIAGNOSIS — C50919 Malignant neoplasm of unspecified site of unspecified female breast: Secondary | ICD-10-CM | POA: Insufficient documentation

## 2016-09-07 DIAGNOSIS — Z1509 Genetic susceptibility to other malignant neoplasm: Secondary | ICD-10-CM | POA: Diagnosis not present

## 2016-09-07 DIAGNOSIS — K648 Other hemorrhoids: Secondary | ICD-10-CM | POA: Insufficient documentation

## 2016-09-07 DIAGNOSIS — Z803 Family history of malignant neoplasm of breast: Secondary | ICD-10-CM | POA: Diagnosis not present

## 2016-09-07 DIAGNOSIS — Z01812 Encounter for preprocedural laboratory examination: Secondary | ICD-10-CM | POA: Diagnosis not present

## 2016-09-07 DIAGNOSIS — Z853 Personal history of malignant neoplasm of breast: Secondary | ICD-10-CM | POA: Diagnosis not present

## 2016-09-07 DIAGNOSIS — K644 Residual hemorrhoidal skin tags: Secondary | ICD-10-CM | POA: Insufficient documentation

## 2016-09-07 DIAGNOSIS — Z8041 Family history of malignant neoplasm of ovary: Secondary | ICD-10-CM | POA: Insufficient documentation

## 2016-09-07 DIAGNOSIS — Z8481 Family history of carrier of genetic disease: Secondary | ICD-10-CM | POA: Diagnosis not present

## 2016-09-07 HISTORY — DX: Genetic susceptibility to other malignant neoplasm of digestive system: Z15.068

## 2016-09-07 HISTORY — DX: Genetic susceptibility to other malignant neoplasm: Z15.09

## 2016-09-07 LAB — TYPE AND SCREEN
ABO/RH(D): O POS
Antibody Screen: NEGATIVE

## 2016-09-07 LAB — CBC
HCT: 41.7 % (ref 36.0–46.0)
Hemoglobin: 14.2 g/dL (ref 12.0–15.0)
MCH: 30.3 pg (ref 26.0–34.0)
MCHC: 34.1 g/dL (ref 30.0–36.0)
MCV: 89.1 fL (ref 78.0–100.0)
Platelets: 182 10*3/uL (ref 150–400)
RBC: 4.68 MIL/uL (ref 3.87–5.11)
RDW: 13.4 % (ref 11.5–15.5)
WBC: 7.9 10*3/uL (ref 4.0–10.5)

## 2016-09-07 LAB — ABO/RH: ABO/RH(D): O POS

## 2016-09-07 NOTE — Patient Instructions (Signed)
Your procedure is scheduled on:  Thursday, September 15, 2016  Enter through the Main Entrance of [facility] at:  6:00 AM  Pick up the phone at the desk and dial [phone].  Call this number if you have problems the morning of surgery: [phone].  Remember: Do NOT eat food or drink after:  Midnight Wednesday  Take these medicines the morning of surgery with a SIP OF WATER:  Nexium, Clonazepam if needed  Stop ALL herbal medications at this time  Do NOT smoke the day of surgery.  Do NOT wear jewelry (body piercing), metal hair clips/bobby pins, make-up, artifical eyelashes or nail polish. Do NOT wear lotions, powders, or perfumes.  You may wear deodorant. Do NOT shave for 48 hours prior to surgery. Do NOT bring valuables to the hospital. Contacts, dentures, or bridgework may not be worn into surgery.  Leave suitcase in car.  After surgery it may be brought to your room.  For patients admitted to the hospital, checkout time is 11:00 AM the day of discharge.  Bring a copy of your healthcare power of attorney and living will documents.

## 2016-09-14 NOTE — Anesthesia Preprocedure Evaluation (Addendum)
Anesthesia Evaluation  Patient identified by MRN, date of birth, ID band Patient awake    Reviewed: Allergy & Precautions, NPO status , Patient's Chart, lab work & pertinent test results  History of Anesthesia Complications Negative for: history of anesthetic complications  Airway Mallampati: II  TM Distance: >3 FB Neck ROM: Full    Dental no notable dental hx. (+) Dental Advisory Given   Pulmonary former smoker,    Pulmonary exam normal        Cardiovascular negative cardio ROS Normal cardiovascular exam     Neuro/Psych PSYCHIATRIC DISORDERS Anxiety Depression negative neurological ROS     GI/Hepatic Neg liver ROS, GERD  Medicated,  Endo/Other  Morbid obesity  Renal/GU negative Renal ROS  negative genitourinary   Musculoskeletal negative musculoskeletal ROS (+)   Abdominal (+) - obese,   Peds negative pediatric ROS (+)  Hematology negative hematology ROS (+)   Anesthesia Other Findings   Reproductive/Obstetrics negative OB ROS                            Anesthesia Physical  Anesthesia Plan  ASA: III  Anesthesia Plan: General   Post-op Pain Management:    Induction: Intravenous  PONV Risk Score and Plan: 4 or greater and Ondansetron, Dexamethasone, Scopolamine patch - Pre-op and Diphenhydramine  Airway Management Planned: Oral ETT  Additional Equipment:   Intra-op Plan:   Post-operative Plan: Extubation in OR  Informed Consent: I have reviewed the patients History and Physical, chart, labs and discussed the procedure including the risks, benefits and alternatives for the proposed anesthesia with the patient or authorized representative who has indicated his/her understanding and acceptance.   Dental advisory given  Plan Discussed with: Anesthesiologist and CRNA  Anesthesia Plan Comments:        Anesthesia Quick Evaluation

## 2016-09-15 ENCOUNTER — Ambulatory Visit (HOSPITAL_COMMUNITY): Payer: BLUE CROSS/BLUE SHIELD | Admitting: Anesthesiology

## 2016-09-15 ENCOUNTER — Observation Stay (HOSPITAL_COMMUNITY)
Admission: RE | Admit: 2016-09-15 | Discharge: 2016-09-16 | Disposition: A | Discharge status: Home / Self Care | Payer: BLUE CROSS/BLUE SHIELD | Source: Ambulatory Visit | Attending: Obstetrics and Gynecology | Admitting: Obstetrics and Gynecology

## 2016-09-15 ENCOUNTER — Encounter (HOSPITAL_COMMUNITY): Payer: Self-pay | Admitting: *Deleted

## 2016-09-15 ENCOUNTER — Encounter (HOSPITAL_COMMUNITY)
Admission: RE | Disposition: A | Discharge status: Home / Self Care | Payer: Self-pay | Source: Ambulatory Visit | Attending: Obstetrics and Gynecology

## 2016-09-15 DIAGNOSIS — Z853 Personal history of malignant neoplasm of breast: Secondary | ICD-10-CM | POA: Diagnosis not present

## 2016-09-15 DIAGNOSIS — Z803 Family history of malignant neoplasm of breast: Secondary | ICD-10-CM | POA: Diagnosis not present

## 2016-09-15 DIAGNOSIS — Z87891 Personal history of nicotine dependence: Secondary | ICD-10-CM | POA: Insufficient documentation

## 2016-09-15 DIAGNOSIS — Z4002 Encounter for prophylactic removal of ovary: Secondary | ICD-10-CM | POA: Diagnosis not present

## 2016-09-15 DIAGNOSIS — Z978 Presence of other specified devices: Secondary | ICD-10-CM | POA: Insufficient documentation

## 2016-09-15 DIAGNOSIS — F329 Major depressive disorder, single episode, unspecified: Secondary | ICD-10-CM | POA: Diagnosis not present

## 2016-09-15 DIAGNOSIS — D259 Leiomyoma of uterus, unspecified: Secondary | ICD-10-CM | POA: Diagnosis not present

## 2016-09-15 DIAGNOSIS — G43909 Migraine, unspecified, not intractable, without status migrainosus: Secondary | ICD-10-CM | POA: Diagnosis not present

## 2016-09-15 DIAGNOSIS — Z79899 Other long term (current) drug therapy: Secondary | ICD-10-CM | POA: Insufficient documentation

## 2016-09-15 DIAGNOSIS — R5082 Postprocedural fever: Secondary | ICD-10-CM | POA: Insufficient documentation

## 2016-09-15 DIAGNOSIS — F419 Anxiety disorder, unspecified: Secondary | ICD-10-CM | POA: Insufficient documentation

## 2016-09-15 DIAGNOSIS — K219 Gastro-esophageal reflux disease without esophagitis: Secondary | ICD-10-CM | POA: Insufficient documentation

## 2016-09-15 DIAGNOSIS — K589 Irritable bowel syndrome without diarrhea: Secondary | ICD-10-CM | POA: Diagnosis not present

## 2016-09-15 DIAGNOSIS — Z1509 Genetic susceptibility to other malignant neoplasm: Secondary | ICD-10-CM | POA: Diagnosis not present

## 2016-09-15 DIAGNOSIS — Z8041 Family history of malignant neoplasm of ovary: Secondary | ICD-10-CM | POA: Diagnosis not present

## 2016-09-15 DIAGNOSIS — N888 Other specified noninflammatory disorders of cervix uteri: Secondary | ICD-10-CM | POA: Insufficient documentation

## 2016-09-15 DIAGNOSIS — M79662 Pain in left lower leg: Secondary | ICD-10-CM | POA: Insufficient documentation

## 2016-09-15 DIAGNOSIS — Z91030 Bee allergy status: Secondary | ICD-10-CM | POA: Insufficient documentation

## 2016-09-15 DIAGNOSIS — Z6841 Body Mass Index (BMI) 40.0 and over, adult: Secondary | ICD-10-CM | POA: Insufficient documentation

## 2016-09-15 DIAGNOSIS — Z808 Family history of malignant neoplasm of other organs or systems: Secondary | ICD-10-CM | POA: Diagnosis not present

## 2016-09-15 DIAGNOSIS — C50919 Malignant neoplasm of unspecified site of unspecified female breast: Secondary | ICD-10-CM | POA: Diagnosis not present

## 2016-09-15 DIAGNOSIS — F418 Other specified anxiety disorders: Secondary | ICD-10-CM | POA: Diagnosis not present

## 2016-09-15 DIAGNOSIS — K644 Residual hemorrhoidal skin tags: Secondary | ICD-10-CM | POA: Diagnosis not present

## 2016-09-15 DIAGNOSIS — K648 Other hemorrhoids: Secondary | ICD-10-CM | POA: Diagnosis not present

## 2016-09-15 DIAGNOSIS — Z888 Allergy status to other drugs, medicaments and biological substances status: Secondary | ICD-10-CM | POA: Insufficient documentation

## 2016-09-15 HISTORY — PX: LAPAROSCOPIC VAGINAL HYSTERECTOMY WITH SALPINGO OOPHORECTOMY: SHX6681

## 2016-09-15 LAB — PREGNANCY, URINE: Preg Test, Ur: NEGATIVE

## 2016-09-15 SURGERY — HYSTERECTOMY, VAGINAL, LAPAROSCOPY-ASSISTED, WITH SALPINGO-OOPHORECTOMY
Anesthesia: General | Site: Vagina | Laterality: Bilateral

## 2016-09-15 MED ORDER — ROCURONIUM BROMIDE 100 MG/10ML IV SOLN
INTRAVENOUS | Status: DC | PRN
  Administered 2016-09-15: 50 mg via INTRAVENOUS

## 2016-09-15 MED ORDER — SUGAMMADEX SODIUM 200 MG/2ML IV SOLN
INTRAVENOUS | Status: DC | PRN
  Administered 2016-09-15: 236.8 mg via INTRAVENOUS

## 2016-09-15 MED ORDER — KETOROLAC TROMETHAMINE 30 MG/ML IJ SOLN
30.0000 mg | Freq: Four times a day (QID) | INTRAMUSCULAR | Status: DC
  Filled 2016-09-15: qty 1

## 2016-09-15 MED ORDER — BUPIVACAINE HCL (PF) 0.25 % IJ SOLN
INTRAMUSCULAR | Status: AC
  Filled 2016-09-15: qty 30

## 2016-09-15 MED ORDER — PROMETHAZINE HCL 25 MG/ML IJ SOLN: 6.2500 mg | INTRAMUSCULAR | Status: DC | PRN

## 2016-09-15 MED ORDER — LIDOCAINE HCL (CARDIAC) 20 MG/ML IV SOLN
INTRAVENOUS | Status: DC | PRN
  Administered 2016-09-15: 70 mg via INTRAVENOUS
  Administered 2016-09-15: 30 mg via INTRAVENOUS

## 2016-09-15 MED ORDER — ONDANSETRON HCL 4 MG/2ML IJ SOLN: 4.0000 mg | Freq: Four times a day (QID) | INTRAMUSCULAR | Status: DC | PRN

## 2016-09-15 MED ORDER — DIPHENHYDRAMINE HCL 12.5 MG/5ML PO ELIX: 12.5000 mg | ORAL_SOLUTION | Freq: Four times a day (QID) | ORAL | Status: DC | PRN

## 2016-09-15 MED ORDER — 0.9 % SODIUM CHLORIDE (POUR BTL) OPTIME
TOPICAL | Status: DC | PRN
  Administered 2016-09-15: 1000 mL

## 2016-09-15 MED ORDER — OXYCODONE-ACETAMINOPHEN 5-325 MG PO TABS
1.0000 | ORAL_TABLET | ORAL | Status: DC | PRN
  Administered 2016-09-16: 2 via ORAL
  Filled 2016-09-15: qty 2

## 2016-09-15 MED ORDER — KETOROLAC TROMETHAMINE 30 MG/ML IJ SOLN
INTRAMUSCULAR | Status: AC
  Filled 2016-09-15: qty 1

## 2016-09-15 MED ORDER — DEXTROSE IN LACTATED RINGERS 5 % IV SOLN
INTRAVENOUS | Status: DC
  Administered 2016-09-15: 125 mL/h via INTRAVENOUS
  Administered 2016-09-15: 13:00:00 via INTRAVENOUS
  Administered 2016-09-16: 125 mL/h via INTRAVENOUS

## 2016-09-15 MED ORDER — ONDANSETRON HCL 4 MG/2ML IJ SOLN
INTRAMUSCULAR | Status: DC | PRN
  Administered 2016-09-15: 4 mg via INTRAVENOUS

## 2016-09-15 MED ORDER — LIDOCAINE HCL (CARDIAC) 20 MG/ML IV SOLN
INTRAVENOUS | Status: AC
Start: 2016-09-15 — End: ?
  Filled 2016-09-15: qty 5

## 2016-09-15 MED ORDER — CEFOTETAN DISODIUM-DEXTROSE 2-2.08 GM-% IV SOLR
2.0000 g | INTRAVENOUS | Status: AC
  Administered 2016-09-15: 2 g via INTRAVENOUS

## 2016-09-15 MED ORDER — ESOMEPRAZOLE MAGNESIUM 20 MG PO PACK: 20.0000 mg | PACK | Freq: Every day | ORAL | Status: DC

## 2016-09-15 MED ORDER — HYDROMORPHONE HCL 1 MG/ML IJ SOLN
0.2500 mg | INTRAMUSCULAR | Status: DC | PRN
  Administered 2016-09-15 (×2): 0.5 mg via INTRAVENOUS

## 2016-09-15 MED ORDER — LABETALOL HCL 5 MG/ML IV SOLN
INTRAVENOUS | Status: DC | PRN
  Administered 2016-09-15: 5 mg via INTRAVENOUS

## 2016-09-15 MED ORDER — FAMOTIDINE 20 MG PO TABS: 20.0000 mg | ORAL_TABLET | Freq: Two times a day (BID) | ORAL | Status: DC

## 2016-09-15 MED ORDER — DEXTROSE IN LACTATED RINGERS 5 % IV SOLN: INTRAVENOUS | Status: DC

## 2016-09-15 MED ORDER — MIDAZOLAM HCL 2 MG/2ML IJ SOLN
INTRAMUSCULAR | Status: DC | PRN
  Administered 2016-09-15: 1 mg via INTRAVENOUS

## 2016-09-15 MED ORDER — ROCURONIUM BROMIDE 100 MG/10ML IV SOLN
INTRAVENOUS | Status: AC
Start: 2016-09-15 — End: ?
  Filled 2016-09-15: qty 1

## 2016-09-15 MED ORDER — DEXAMETHASONE SODIUM PHOSPHATE 10 MG/ML IJ SOLN
INTRAMUSCULAR | Status: DC | PRN
  Administered 2016-09-15: 4 mg via INTRAVENOUS

## 2016-09-15 MED ORDER — NALOXONE HCL 0.4 MG/ML IJ SOLN: 0.4000 mg | INTRAMUSCULAR | Status: DC | PRN

## 2016-09-15 MED ORDER — MIDAZOLAM HCL 2 MG/2ML IJ SOLN
INTRAMUSCULAR | Status: AC
  Filled 2016-09-15: qty 2

## 2016-09-15 MED ORDER — CEFOTETAN DISODIUM-DEXTROSE 2-2.08 GM-% IV SOLR
INTRAVENOUS | Status: AC
  Filled 2016-09-15: qty 50

## 2016-09-15 MED ORDER — MORPHINE SULFATE 2 MG/ML IV SOLN: INTRAVENOUS | Status: DC

## 2016-09-15 MED ORDER — DEXAMETHASONE SODIUM PHOSPHATE 4 MG/ML IJ SOLN
INTRAMUSCULAR | Status: AC
  Filled 2016-09-15: qty 1

## 2016-09-15 MED ORDER — SODIUM CHLORIDE 0.9 % IJ SOLN
INTRAMUSCULAR | Status: DC | PRN
  Administered 2016-09-15: 10 mL

## 2016-09-15 MED ORDER — BUPIVACAINE HCL (PF) 0.25 % IJ SOLN
INTRAMUSCULAR | Status: DC | PRN
  Administered 2016-09-15: 6 mL

## 2016-09-15 MED ORDER — HYDROMORPHONE HCL 1 MG/ML IJ SOLN
INTRAMUSCULAR | Status: AC
  Filled 2016-09-15: qty 0.5

## 2016-09-15 MED ORDER — HYDROMORPHONE HCL 1 MG/ML IJ SOLN: 0.2500 mg | INTRAMUSCULAR | Status: DC | PRN

## 2016-09-15 MED ORDER — LACTATED RINGERS IV SOLN
INTRAVENOUS | Status: DC
  Administered 2016-09-15 (×3): via INTRAVENOUS

## 2016-09-15 MED ORDER — FENTANYL CITRATE (PF) 100 MCG/2ML IJ SOLN
INTRAMUSCULAR | Status: DC | PRN
  Administered 2016-09-15: 25 ug via INTRAVENOUS
  Administered 2016-09-15: 50 ug via INTRAVENOUS
  Administered 2016-09-15 (×2): 25 ug via INTRAVENOUS
  Administered 2016-09-15 (×3): 50 ug via INTRAVENOUS

## 2016-09-15 MED ORDER — BUTORPHANOL TARTRATE 1 MG/ML IJ SOLN: 1.0000 mg | INTRAMUSCULAR | Status: DC | PRN

## 2016-09-15 MED ORDER — MEPERIDINE HCL 25 MG/ML IJ SOLN
INTRAMUSCULAR | Status: AC
  Administered 2016-09-15: 6.25 mg
  Filled 2016-09-15: qty 1

## 2016-09-15 MED ORDER — SODIUM CHLORIDE 0.9 % IJ SOLN
INTRAMUSCULAR | Status: AC
  Filled 2016-09-15: qty 20

## 2016-09-15 MED ORDER — HYDROMORPHONE HCL 1 MG/ML IJ SOLN
INTRAMUSCULAR | Status: AC
  Administered 2016-09-15: 0.5 mg via INTRAVENOUS
  Filled 2016-09-15: qty 0.5

## 2016-09-15 MED ORDER — IBUPROFEN 800 MG PO TABS: 800.0000 mg | ORAL_TABLET | Freq: Three times a day (TID) | ORAL | Status: DC | PRN

## 2016-09-15 MED ORDER — MENTHOL 3 MG MT LOZG: 1.0000 | LOZENGE | OROMUCOSAL | Status: DC | PRN

## 2016-09-15 MED ORDER — LABETALOL HCL 5 MG/ML IV SOLN
INTRAVENOUS | Status: AC
Start: 2016-09-15 — End: ?
  Filled 2016-09-15: qty 4

## 2016-09-15 MED ORDER — MORPHINE SULFATE 2 MG/ML IV SOLN
INTRAVENOUS | Status: DC
  Administered 2016-09-15: 2 mL via INTRAVENOUS
  Administered 2016-09-15: 2 mg via INTRAVENOUS
  Administered 2016-09-15: 1 mg via INTRAVENOUS
  Filled 2016-09-15: qty 50
  Filled 2016-09-15: qty 30

## 2016-09-15 MED ORDER — LACTATED RINGERS IR SOLN
Status: DC | PRN
  Administered 2016-09-15: 3000 mL

## 2016-09-15 MED ORDER — CLONAZEPAM 0.5 MG PO TABS
0.2500 mg | ORAL_TABLET | Freq: Two times a day (BID) | ORAL | Status: DC | PRN
  Administered 2016-09-15: 0.25 mg via ORAL
  Filled 2016-09-15: qty 1

## 2016-09-15 MED ORDER — ONDANSETRON HCL 4 MG/2ML IJ SOLN
INTRAMUSCULAR | Status: AC
  Filled 2016-09-15: qty 2

## 2016-09-15 MED ORDER — SCOPOLAMINE 1 MG/3DAYS TD PT72: 1.0000 | MEDICATED_PATCH | TRANSDERMAL | Status: DC

## 2016-09-15 MED ORDER — SCOPOLAMINE 1 MG/3DAYS TD PT72
MEDICATED_PATCH | TRANSDERMAL | Status: AC
  Administered 2016-09-15: 1.5 mg via TRANSDERMAL
  Filled 2016-09-15: qty 1

## 2016-09-15 MED ORDER — KETOROLAC TROMETHAMINE 30 MG/ML IJ SOLN
30.0000 mg | Freq: Four times a day (QID) | INTRAMUSCULAR | Status: DC
  Administered 2016-09-15 – 2016-09-16 (×4): 30 mg via INTRAVENOUS
  Filled 2016-09-15 (×4): qty 1

## 2016-09-15 MED ORDER — PROPOFOL 10 MG/ML IV BOLUS
INTRAVENOUS | Status: AC
  Filled 2016-09-15: qty 20

## 2016-09-15 MED ORDER — FENTANYL CITRATE (PF) 250 MCG/5ML IJ SOLN
INTRAMUSCULAR | Status: AC
  Filled 2016-09-15: qty 5

## 2016-09-15 MED ORDER — KETOROLAC TROMETHAMINE 30 MG/ML IJ SOLN
INTRAMUSCULAR | Status: DC | PRN
  Administered 2016-09-15: 30 mg via INTRAVENOUS

## 2016-09-15 MED ORDER — FENTANYL CITRATE (PF) 100 MCG/2ML IJ SOLN
INTRAMUSCULAR | Status: AC
  Filled 2016-09-15: qty 2

## 2016-09-15 MED ORDER — ONDANSETRON HCL 4 MG/2ML IJ SOLN
4.0000 mg | Freq: Four times a day (QID) | INTRAMUSCULAR | Status: DC | PRN
  Filled 2016-09-15: qty 2

## 2016-09-15 MED ORDER — SCOPOLAMINE 1 MG/3DAYS TD PT72
1.0000 | MEDICATED_PATCH | Freq: Once | TRANSDERMAL | Status: DC
  Administered 2016-09-15: 1.5 mg via TRANSDERMAL

## 2016-09-15 MED ORDER — SUGAMMADEX SODIUM 200 MG/2ML IV SOLN
INTRAVENOUS | Status: AC
  Filled 2016-09-15: qty 2

## 2016-09-15 MED ORDER — SODIUM CHLORIDE 0.9% FLUSH: 9.0000 mL | INTRAVENOUS | Status: DC | PRN

## 2016-09-15 MED ORDER — SERTRALINE HCL 50 MG PO TABS
150.0000 mg | ORAL_TABLET | Freq: Every day | ORAL | Status: DC
  Administered 2016-09-15: 150 mg via ORAL
  Filled 2016-09-15 (×2): qty 1

## 2016-09-15 MED ORDER — ONDANSETRON HCL 4 MG PO TABS: 4.0000 mg | ORAL_TABLET | Freq: Four times a day (QID) | ORAL | Status: DC | PRN

## 2016-09-15 MED ORDER — DIPHENHYDRAMINE HCL 50 MG/ML IJ SOLN: 12.5000 mg | Freq: Four times a day (QID) | INTRAMUSCULAR | Status: DC | PRN

## 2016-09-15 SURGICAL SUPPLY — 43 items
ADH SKN CLS APL DERMABOND .7 (GAUZE/BANDAGES/DRESSINGS) ×1
CABLE HIGH FREQUENCY MONO STRZ (ELECTRODE) IMPLANT
CATH ROBINSON RED A/P 16FR (CATHETERS) ×2 IMPLANT
CLOTH BEACON ORANGE TIMEOUT ST (SAFETY) ×2 IMPLANT
CONT PATH 16OZ SNAP LID 3702 (MISCELLANEOUS) ×2 IMPLANT
COVER BACK TABLE 60X90IN (DRAPES) ×2 IMPLANT
DECANTER SPIKE VIAL GLASS SM (MISCELLANEOUS) IMPLANT
DERMABOND ADVANCED (GAUZE/BANDAGES/DRESSINGS) ×1
DERMABOND ADVANCED .7 DNX12 (GAUZE/BANDAGES/DRESSINGS) ×1 IMPLANT
DRSG OPSITE POSTOP 3X4 (GAUZE/BANDAGES/DRESSINGS) ×1 IMPLANT
DURAPREP 26ML APPLICATOR (WOUND CARE) ×2 IMPLANT
ELECT REM PT RETURN 9FT ADLT (ELECTROSURGICAL) ×2
ELECTRODE REM PT RTRN 9FT ADLT (ELECTROSURGICAL) ×1 IMPLANT
GLOVE BIO SURGEON STRL SZ7 (GLOVE) ×4 IMPLANT
GLOVE BIOGEL PI IND STRL 6.5 (GLOVE) ×1 IMPLANT
GLOVE BIOGEL PI IND STRL 7.0 (GLOVE) ×2 IMPLANT
GLOVE BIOGEL PI INDICATOR 6.5 (GLOVE) ×1
GLOVE BIOGEL PI INDICATOR 7.0 (GLOVE) ×2
LEGGING LITHOTOMY PAIR STRL (DRAPES) ×2 IMPLANT
LIGASURE IMPACT 36 18CM CVD LR (INSTRUMENTS) ×1 IMPLANT
NEEDLE INSUFFLATION 120MM (ENDOMECHANICALS) ×2 IMPLANT
NS IRRIG 1000ML POUR BTL (IV SOLUTION) ×2 IMPLANT
PACK LAVH (CUSTOM PROCEDURE TRAY) ×2 IMPLANT
PACK ROBOTIC GOWN (GOWN DISPOSABLE) ×2 IMPLANT
PACK TRENDGUARD 450 HYBRID PRO (MISCELLANEOUS) IMPLANT
PACK TRENDGUARD 600 HYBRD PROC (MISCELLANEOUS) IMPLANT
PROTECTOR NERVE ULNAR (MISCELLANEOUS) ×4 IMPLANT
SEALER TISSUE G2 CVD JAW 45CM (ENDOMECHANICALS) ×2 IMPLANT
SET IRRIG TUBING LAPAROSCOPIC (IRRIGATION / IRRIGATOR) IMPLANT
SLEEVE XCEL OPT CAN 5 100 (ENDOMECHANICALS) ×2 IMPLANT
SUT MON AB 2-0 CT1 36 (SUTURE) ×4 IMPLANT
SUT VIC AB 0 CT1 18XCR BRD8 (SUTURE) ×1 IMPLANT
SUT VIC AB 0 CT1 36 (SUTURE) ×2 IMPLANT
SUT VIC AB 0 CT1 8-18 (SUTURE) ×2
SUT VICRYL 0 TIES 12 18 (SUTURE) ×2 IMPLANT
SUT VICRYL 4-0 PS2 18IN ABS (SUTURE) ×2 IMPLANT
TOWEL OR 17X24 6PK STRL BLUE (TOWEL DISPOSABLE) ×4 IMPLANT
TRAY FOLEY CATH SILVER 14FR (SET/KITS/TRAYS/PACK) ×2 IMPLANT
TRENDGUARD 450 HYBRID PRO PACK (MISCELLANEOUS)
TRENDGUARD 600 HYBRID PROC PK (MISCELLANEOUS) ×2
TROCAR OPTI TIP 5M 100M (ENDOMECHANICALS) ×2 IMPLANT
TROCAR XCEL DIL TIP R 11M (ENDOMECHANICALS) ×2 IMPLANT
WARMER LAPAROSCOPE (MISCELLANEOUS) ×2 IMPLANT

## 2016-09-15 NOTE — OR Nursing (Signed)
Demerol 6.25mg  iv given up on 3rd floor-Verlee Pope rn-given at 1052.

## 2016-09-15 NOTE — Transfer of Care (Signed)
Immediate Anesthesia Transfer of Care Note  Patient: Lynn Simpson  Procedure(s) Performed: Procedure(s): LAPAROSCOPIC ASSISTED VAGINAL HYSTERECTOMY WITH BILATERAL SALPINGO OOPHORECTOMY (Bilateral)  Patient Location: PACU  Anesthesia Type:General  Level of Consciousness: awake, alert , oriented and patient cooperative  Airway & Oxygen Therapy: Patient Spontanous Breathing and Patient connected to nasal cannula oxygen  Post-op Assessment: Report given to RN and Post -op Vital signs reviewed and stable  Post vital signs: Reviewed and stable  Last Vitals:  Vitals:   09/15/16 0614  BP: (!) 145/88  Pulse: 83  Resp: 16  Temp: 36.7 C    Last Pain:  Vitals:   09/15/16 0614  TempSrc: Oral      Patients Stated Pain Goal: 4 (45/14/60 4799)  Complications: No apparent anesthesia complications

## 2016-09-15 NOTE — Op Note (Signed)
Preoperative diagnosis: History of breast cancer, Lynch syndrome, at risk for ovarian/endometrial cancer  Postoperative diagnosis: Same  Procedure: LAVH, BSO  Surgeon: Matthew Saras  Assistant: Royston Sinner  Anesthesia: Gen.  EBL: 1 50 cc  Procedure and findings:  Patient taken the operating room after an adequate level of general anesthesia was obtained the patient's legs in stirrups the abdomen perineum and vagina were prepped and draped Foley catheter used to drain the bladder EUA carried out the uterus was midposition normal size adnexa negative, Hulka tenaculum was positioned. This was done after appropriate timeouts were taken.  Attention directed to the abdomen the subumbilical area was infiltrated with quarter percent Marcaine plain, small incision was made in the varies needle was introduced without difficulty. Its intra-abdominal position verified by pressure water testing. A 2 and half liters pneumoperitoneum syncopated, lap scopic trocar and sleeve were then introduced that difficulty. There was no evidence of any bleeding or trauma patient was then placed in Trendelenburg, 3 finger breaths above the symphysis in the midline a 5 mm trocar was inserted under direct visualization. The bladder had been emptied previously.  Pelvic findings as follows: Bilateral adnexa unremarkable the cul-de-sac was free and clear uterus itself was normal size and mobile the upper abdomen otherwise negative appendix unremarkable.  An atraumatic grasper was then used to grasp the right tube at the distal in the right tube and ovary were placed on traction toward the midline the course of the right ureter was well below the operative site. The Enseal device was then used to coagulate and divide the right IP ligament close to the ovary this is carried continuously down to and including the round ligament on the right side this area was hemostatic the exact same repeated on the opposite side again after carefully  identifying the course of the ureter well below. Once this was freed up the vaginal portion the procedure was started.  The legs were extended, weighted speculum was positioned cervix grasped with tenaculum the cervical vaginal mucosa was incised posterior culdotomy performed without difficulty. Anteriorly there was some mild scar tissue from her prior section staying close to the uterus is dissected free with sharp and blunt dissection until the anterior peritoneal reflection could be identified and entered sharply retractor retractor was then used to gently elevate the bladder out of the field. Uterosacral ligament was clamped divided and suture ligated and held temporarily, in sequential manner the cardinal ligament, uterine basket or pedicle and upper broad ligament pedicles were clamped divided and suture ligated. The fundus was then partially morcellated then delivered posteriorly remaining pedicles were clamped divided first free tie bowel followed by suture ligature of Vicryl. Posterior vaginal cuff was enclose a running locked 2-0 Vicryl suture from 3 to 9:00. Prior to closure sponge, needle, history counts reported as correct 2. Vaginal cuff closed right to left with interrupted 2-0 Vicryl sutures. Foley catheter positioned draining clear urine.  Repeat laparoscopy was carried out, the Nezhat was used to irrigate and aspirate, the pressure was reduced and the operative site was inspected and noted be hemostatic there was some raw surfaces that were hemostatic at the cuff, Arista powder was sprayed across the cuff to ensure good hemostasis. Its was removed, gas allowed to escape the upper incision closed with 4-0 Monocryl subarticular closure Dermabond on the lower she tolerated this well went to recovery room in good condition.  Dictated with Empire[employer].

## 2016-09-15 NOTE — Progress Notes (Signed)
The patient was re-examined with no change in status 

## 2016-09-15 NOTE — Anesthesia Procedure Notes (Signed)
Procedure Name: Intubation Date/Time: 09/15/2016 7:25 AM Performed by: Stacie Glaze C Patient Re-evaluated:Patient Re-evaluated prior to inductionOxygen Delivery Method: Circle system utilized and Simple face mask Preoxygenation: Pre-oxygenation with 100% oxygen Intubation Type: IV induction Ventilation: Mask ventilation without difficulty and Mask ventilation with difficulty Laryngoscope Size: Mac and 3 Grade View: Grade II Tube type: Oral Tube size: 7.0 mm Number of attempts: 1 Airway Equipment and Method: Stylet Secured at: 21 (com) cm Tube secured with: Tape Dental Injury: Teeth and Oropharynx as per pre-operative assessment

## 2016-09-15 NOTE — Anesthesia Postprocedure Evaluation (Signed)
Anesthesia Post Note  Patient: Lynn Simpson  Procedure(s) Performed: Procedure(s) (LRB): LAPAROSCOPIC ASSISTED VAGINAL HYSTERECTOMY WITH BILATERAL SALPINGO OOPHORECTOMY (Bilateral)     Patient location during evaluation: PACU Anesthesia Type: General Level of consciousness: sedated Pain management: pain level controlled Vital Signs Assessment: post-procedure vital signs reviewed and stable Respiratory status: spontaneous breathing and respiratory function stable Cardiovascular status: stable Anesthetic complications: no    Last Vitals:  Vitals:   09/15/16 1026 09/15/16 1029  BP:    Pulse: 71 71  Resp: 16 15  Temp:  36.6 C    Last Pain:  Vitals:   09/15/16 1029  TempSrc: Oral  PainSc:    Pain Goal: Patients Stated Pain Goal: 4 (09/15/16 8882)               [place]

## 2016-09-16 ENCOUNTER — Inpatient Hospital Stay (HOSPITAL_BASED_OUTPATIENT_CLINIC_OR_DEPARTMENT_OTHER): Payer: BLUE CROSS/BLUE SHIELD

## 2016-09-16 ENCOUNTER — Inpatient Hospital Stay (HOSPITAL_COMMUNITY): Payer: BLUE CROSS/BLUE SHIELD

## 2016-09-16 ENCOUNTER — Encounter (HOSPITAL_COMMUNITY): Payer: Self-pay | Admitting: Obstetrics and Gynecology

## 2016-09-16 ENCOUNTER — Observation Stay (HOSPITAL_COMMUNITY)
Admission: AD | Admit: 2016-09-16 | Discharge: 2016-09-17 | Disposition: A | Discharge status: Home / Self Care | Payer: BLUE CROSS/BLUE SHIELD | Source: Ambulatory Visit | Attending: Obstetrics & Gynecology | Admitting: Obstetrics & Gynecology

## 2016-09-16 DIAGNOSIS — M79609 Pain in unspecified limb: Secondary | ICD-10-CM | POA: Diagnosis not present

## 2016-09-16 DIAGNOSIS — M79605 Pain in left leg: Secondary | ICD-10-CM

## 2016-09-16 DIAGNOSIS — Z90710 Acquired absence of both cervix and uterus: Secondary | ICD-10-CM

## 2016-09-16 DIAGNOSIS — D72829 Elevated white blood cell count, unspecified: Secondary | ICD-10-CM

## 2016-09-16 DIAGNOSIS — R5082 Postprocedural fever: Secondary | ICD-10-CM | POA: Diagnosis not present

## 2016-09-16 LAB — URINALYSIS, ROUTINE W REFLEX MICROSCOPIC
Bacteria, UA: NONE SEEN
Bilirubin Urine: NEGATIVE
Glucose, UA: NEGATIVE mg/dL
Ketones, ur: NEGATIVE mg/dL
Leukocytes, UA: NEGATIVE
Nitrite: NEGATIVE
Protein, ur: NEGATIVE mg/dL
Specific Gravity, Urine: 1.002 — ABNORMAL LOW (ref 1.005–1.030)
Squamous Epithelial / LPF: NONE SEEN
WBC, UA: NONE SEEN WBC/hpf (ref 0–5)
pH: 6 (ref 5.0–8.0)

## 2016-09-16 LAB — CBC WITH DIFFERENTIAL/PLATELET
Basophils Absolute: 0 10*3/uL (ref 0.0–0.1)
Basophils Relative: 0 %
Eosinophils Absolute: 0.1 10*3/uL (ref 0.0–0.7)
Eosinophils Relative: 0 %
HCT: 36.1 % (ref 36.0–46.0)
Hemoglobin: 12 g/dL (ref 12.0–15.0)
Lymphocytes Relative: 11 %
Lymphs Abs: 2.3 10*3/uL (ref 0.7–4.0)
MCH: 29.9 pg (ref 26.0–34.0)
MCHC: 33.2 g/dL (ref 30.0–36.0)
MCV: 90 fL (ref 78.0–100.0)
Monocytes Absolute: 0.8 10*3/uL (ref 0.1–1.0)
Monocytes Relative: 4 %
Neutro Abs: 17.3 10*3/uL — ABNORMAL HIGH (ref 1.7–7.7)
Neutrophils Relative %: 85 %
Platelets: 149 10*3/uL — ABNORMAL LOW (ref 150–400)
RBC: 4.01 MIL/uL (ref 3.87–5.11)
RDW: 13.7 % (ref 11.5–15.5)
WBC: 20.5 10*3/uL — ABNORMAL HIGH (ref 4.0–10.5)

## 2016-09-16 LAB — CBC
HCT: 40.4 % (ref 36.0–46.0)
Hemoglobin: 13.3 g/dL (ref 12.0–15.0)
MCH: 29.8 pg (ref 26.0–34.0)
MCHC: 32.9 g/dL (ref 30.0–36.0)
MCV: 90.6 fL (ref 78.0–100.0)
Platelets: 145 10*3/uL — ABNORMAL LOW (ref 150–400)
RBC: 4.46 MIL/uL (ref 3.87–5.11)
RDW: 13.6 % (ref 11.5–15.5)
WBC: 10.8 10*3/uL — ABNORMAL HIGH (ref 4.0–10.5)

## 2016-09-16 IMAGING — CR DG CHEST 2V
2 series · 2 of 2 positions shown · non-contrast
Comparison: [DATE]

CLINICAL DATA: Postop fever hysterectomy high white count

EXAM:
CHEST  2 VIEW

[chest pa]
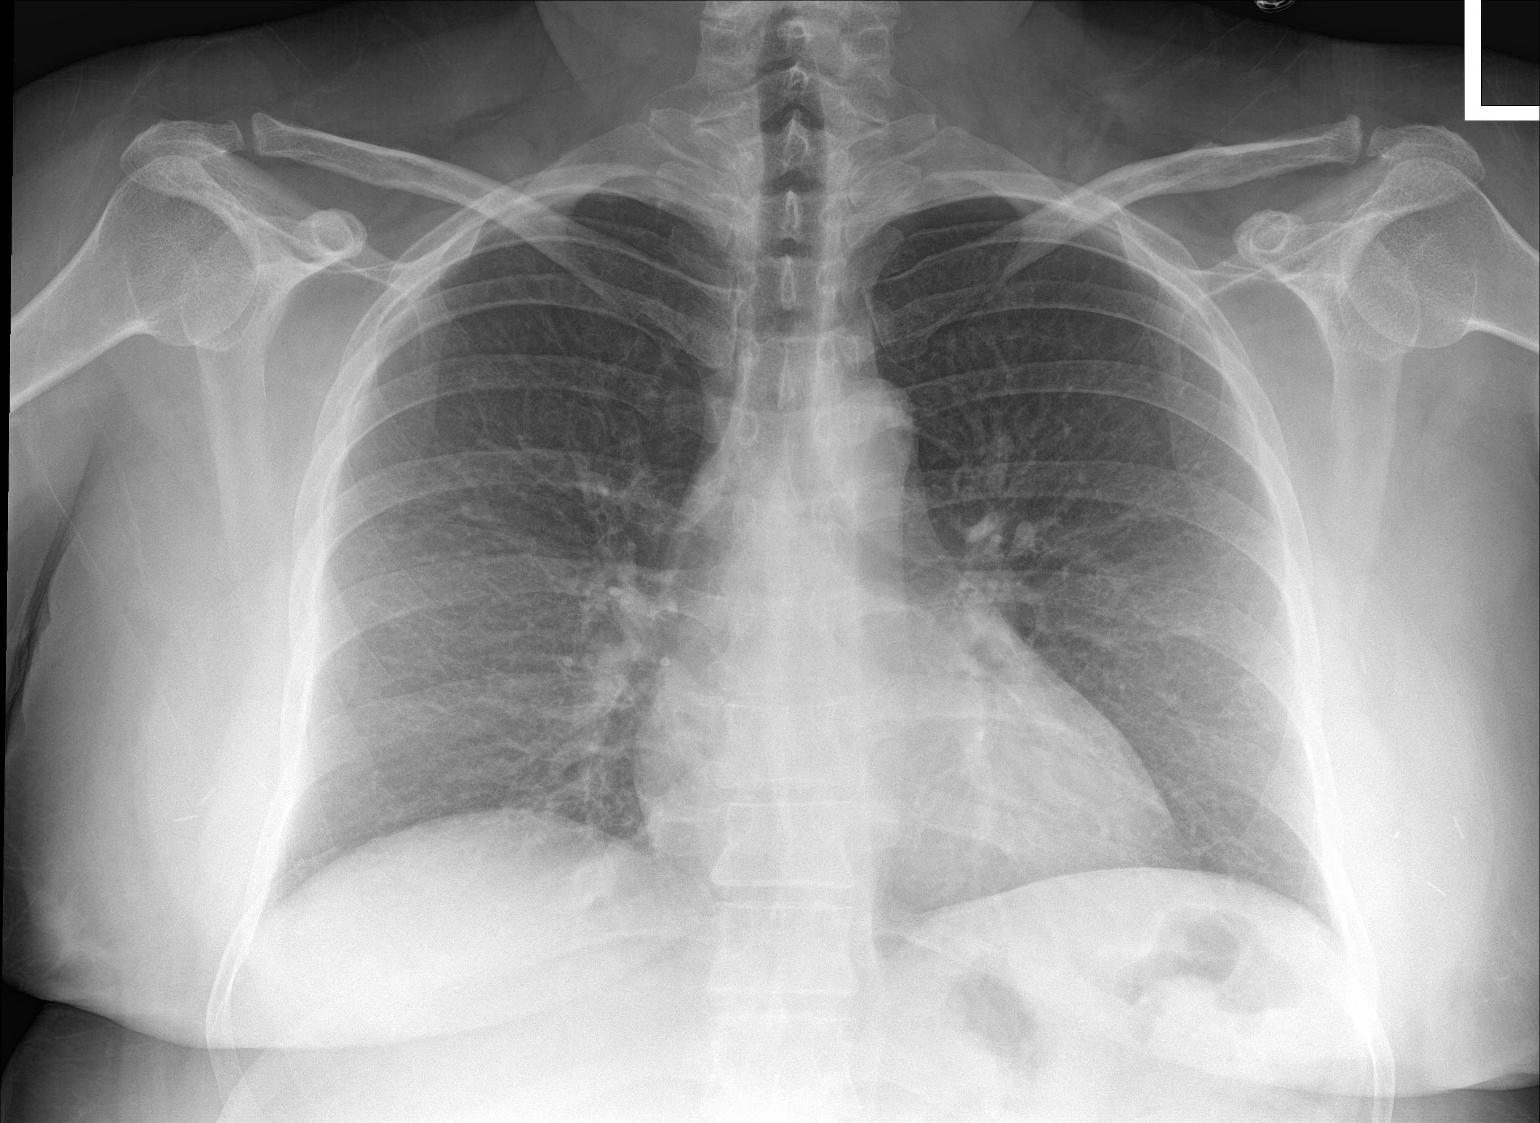

[chest lat]
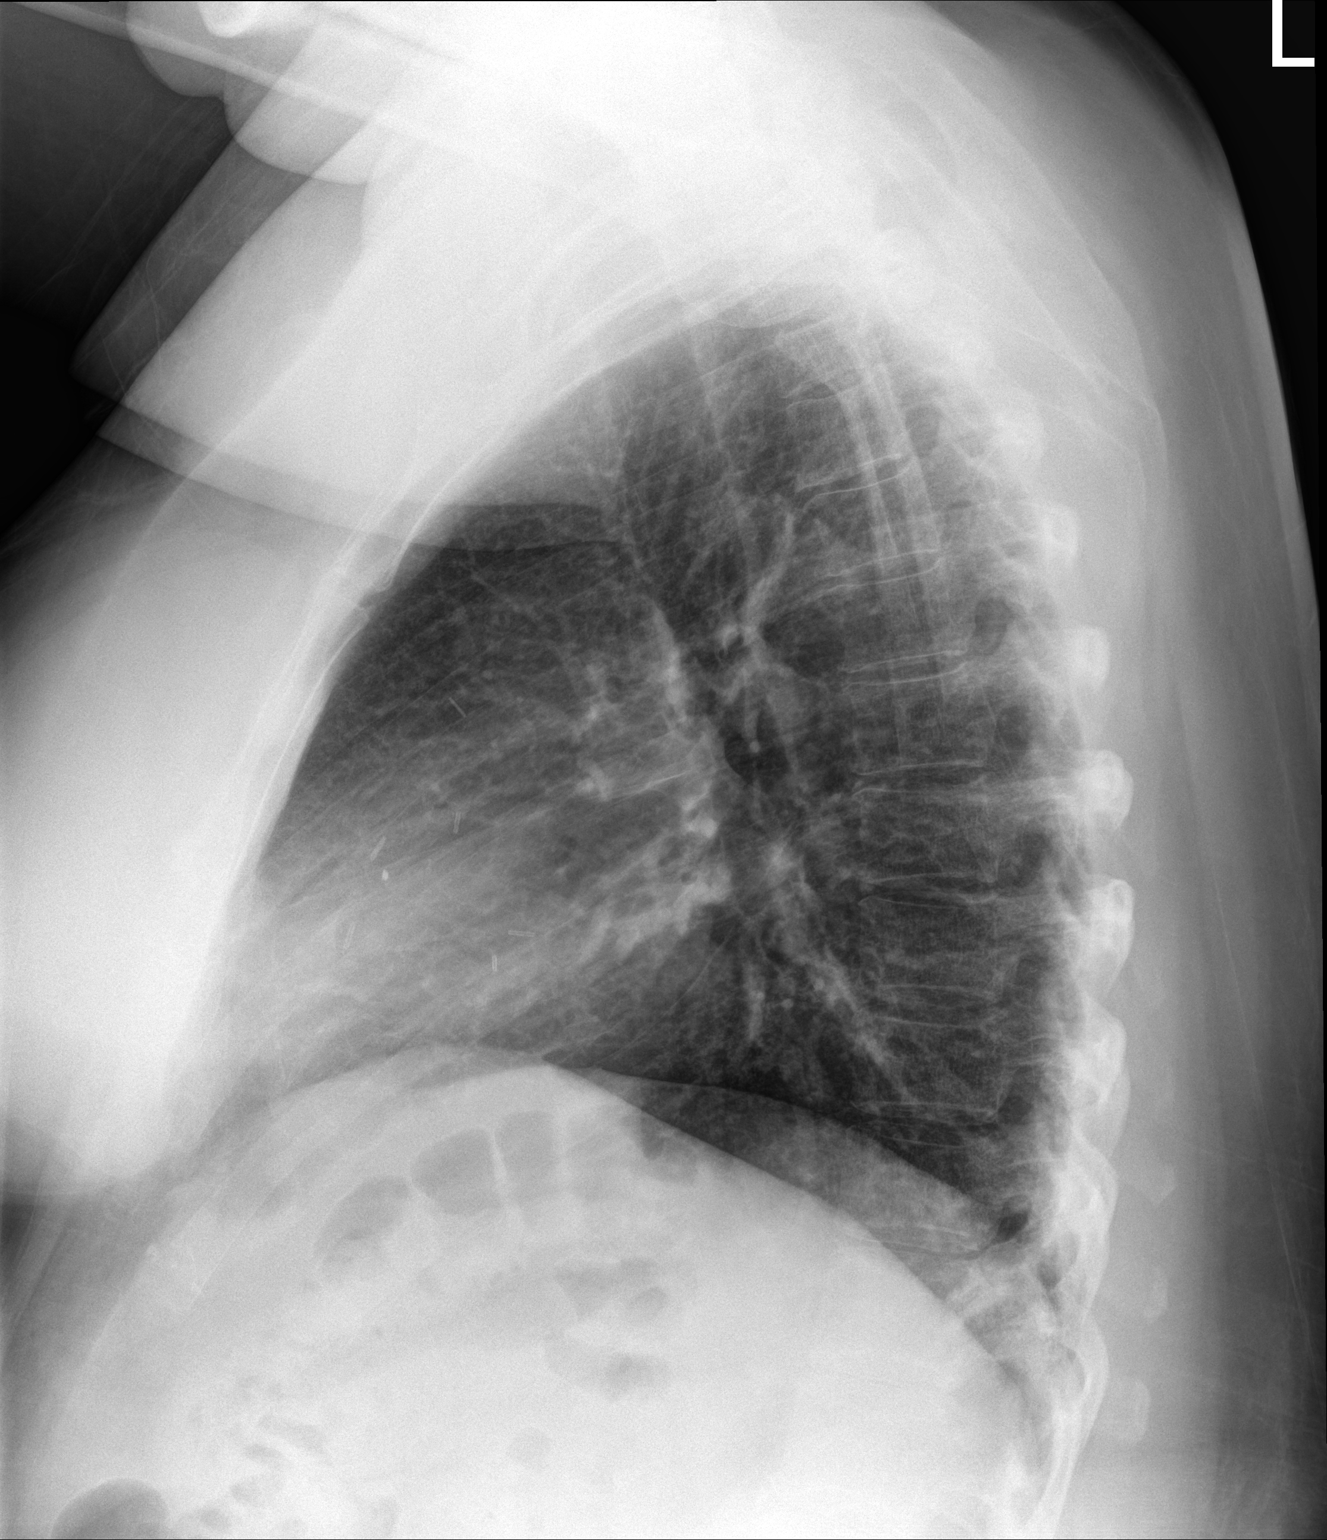

[2 of 2 positions shown; findings below may reference images not displayed]

FINDINGS: The heart size and mediastinal contours are within normal limits.
Both lungs are clear. The visualized skeletal structures are
unremarkable. Surgical clips over the reconstructed breasts
bilaterally.
IMPRESSION: No active cardiopulmonary disease.

## 2016-09-16 MED ORDER — SERTRALINE HCL 100 MG PO TABS
100.0000 mg | ORAL_TABLET | Freq: Every day | ORAL | Status: DC
  Administered 2016-09-17: 100 mg via ORAL
  Filled 2016-09-16: qty 1

## 2016-09-16 MED ORDER — OXYCODONE-ACETAMINOPHEN 5-325 MG PO TABS
1.0000 | ORAL_TABLET | Freq: Four times a day (QID) | ORAL | Status: DC | PRN
  Administered 2016-09-17: 1 via ORAL
  Filled 2016-09-16: qty 1

## 2016-09-16 MED ORDER — IBUPROFEN 800 MG PO TABS: 800.0000 mg | ORAL_TABLET | Freq: Three times a day (TID) | ORAL | 1 refills | Status: DC | PRN

## 2016-09-16 MED ORDER — SODIUM CHLORIDE 0.9 % IV SOLN
INTRAVENOUS | Status: DC
  Administered 2016-09-17 (×2): via INTRAVENOUS

## 2016-09-16 MED ORDER — DEXTROSE 5 % IV SOLN
1.0000 g | Freq: Once | INTRAVENOUS | Status: AC
  Administered 2016-09-16: 1 g via INTRAVENOUS
  Filled 2016-09-16: qty 1

## 2016-09-16 MED ORDER — DIAZEPAM 5 MG PO TABS
10.0000 mg | ORAL_TABLET | Freq: Every day | ORAL | Status: DC
  Administered 2016-09-17: 10 mg via ORAL
  Filled 2016-09-16: qty 2

## 2016-09-16 MED ORDER — IBUPROFEN 800 MG PO TABS
800.0000 mg | ORAL_TABLET | Freq: Three times a day (TID) | ORAL | Status: DC | PRN
  Administered 2016-09-16 – 2016-09-17 (×2): 800 mg via ORAL
  Filled 2016-09-16 (×2): qty 1

## 2016-09-16 NOTE — Progress Notes (Signed)
Received call back from Vascular tech.  She will come as soon as she can after finishing up a case.  ETA approx 1915.

## 2016-09-16 NOTE — Progress Notes (Signed)
1 Day Post-Op Procedure(s) (LRB): LAPAROSCOPIC ASSISTED VAGINAL HYSTERECTOMY WITH BILATERAL SALPINGO OOPHORECTOMY (Bilateral)  Subjective: Patient reports tolerating PO.    Objective: I have reviewed patient's vital signs, medications and labs.  abd soft + BS, incs C/D CBC    Component Value Date/Time   WBC 10.8 (H) 09/16/2016 0559   RBC 4.46 09/16/2016 0559   HGB 13.3 09/16/2016 0559   HCT 40.4 09/16/2016 0559   PLT 145 (L) 09/16/2016 0559   MCV 90.6 09/16/2016 0559   MCH 29.8 09/16/2016 0559   MCHC 32.9 09/16/2016 0559   RDW 13.6 09/16/2016 0559   LYMPHSABS 2.4 09/30/2015 1140   MONOABS 0.6 09/30/2015 1140   EOSABS 0.3 09/30/2015 1140   BASOSABS 0.0 09/30/2015 1140    Assessment: s/p Procedure(s): LAPAROSCOPIC ASSISTED VAGINAL HYSTERECTOMY WITH BILATERAL SALPINGO OOPHORECTOMY (Bilateral): pt had one spurious temp 101 which came down quickly after Toradol, 1 additional  1 gram Cefotan given>>>will obsv tem thru afternoon and if afeb>>D/C late afternoon  Plan: Advance diet Encourage ambulation, see above  LOS: 0 days    Umaiza Matusik M 09/16/2016, 9:05 AM

## 2016-09-16 NOTE — MAU Note (Signed)
Urine in lab 

## 2016-09-16 NOTE — Progress Notes (Signed)
*  PRELIMINARY RESULTS* Vascular Ultrasound Left lower extremity venous duplex has been completed.  Preliminary findings: No evidence of deep vein thrombosis in the visualized veins of the left lower extremity.negative for baker's cyst.  Preliminary results given to [facility] @ 19:55   Everrett Coombe 09/16/2016, 7:56 PM

## 2016-09-16 NOTE — H&P (Signed)
Lynn Simpson is an 46 y.o. female POD#1 s/p LAVH, BSO for Lynch Syndrome presents with left leg pain (behind knee) and fever. She reports the pain started this AM when ambulating prior to discharge and has gradually worsened with time. She had T 101.3 prior to d/c this am but no complaints and no more fever after several hours of observation. She denies CP/SOB.  She has well-controlled postop pain with NSAIDs alone.  She denies vaginal bleeding.  She reports dysuria but UA negative.  WBC this AM prior to D/C was 10 and on readmit 20.  Significant PMH of breast cancer and obesity. Venous Duplex Doppler negative.  CXR negative.    Pertinent Gynecological History: Menses: n/a Bleeding: n/a Contraception: none DES exposure: unknown Blood transfusions: none Sexually transmitted diseases: no past history Previous GYN Procedures: LAVH, BSO  Last mammogram: n/a Date: n/a Last pap: n/a Date: n/a OB History: G3, P3   Menstrual History: Menarche age: n/a Patient's last menstrual period was 09/07/2016.    Past Medical History:  Diagnosis Date  . Anxiety   . Cancer (HCC)    Left Breast  . Depression   . Family history of breast cancer   . Family history of melanoma   . Family history of ovarian cancer   . Gallstones   . GERD (gastroesophageal reflux disease)   . IBS (irritable bowel syndrome)   . Lynch syndrome   . Migraine   . Obesity   . Ovarian cyst     Past Surgical History:  Procedure Laterality Date  . BREAST RECONSTRUCTION WITH PLACEMENT OF TISSUE EXPANDER AND FLEX HD (ACELLULAR HYDRATED DERMIS) Bilateral 03/31/2016   Procedure: BILATERAL BREAST RECONSTRUCTION WITH PLACEMENT OF TISSUE EXPANDER AND FLEX HD (ACELLULAR HYDRATED DERMIS);  Surgeon: Wallace Going, DO;  Location: [facility];  Service: Plastics;  Laterality: Bilateral;  . CESAREAN SECTION  2005  . COLONOSCOPY    . LAPAROSCOPIC VAGINAL HYSTERECTOMY WITH SALPINGO OOPHORECTOMY Bilateral 09/15/2016   Procedure: LAPAROSCOPIC ASSISTED VAGINAL HYSTERECTOMY WITH BILATERAL SALPINGO OOPHORECTOMY;  Surgeon: Molli Posey, MD;  Location: [facility];  Service: Gynecology;  Laterality: Bilateral;  . LIPOSUCTION Bilateral 06/30/2016   Procedure: LIPOSUCTION TO LATERAL CHEST;  Surgeon: Wallace Going, DO;  Location: [place];  Service: Plastics;  Laterality: Bilateral;  . NIPPLE SPARING MASTECTOMY Bilateral 03/31/2016   Procedure: BILATERAL NIPPLE SPARING MASTECTOMIES;  Surgeon: Autumn Messing III, MD;  Location: [place];  Service: General;  Laterality: Bilateral;  . REMOVAL OF BILATERAL TISSUE EXPANDERS WITH PLACEMENT OF BILATERAL BREAST IMPLANTS Bilateral 06/30/2016   Procedure: REMOVAL OF BILATERAL TISSUE EXPANDERS WITH PLACEMENT OF BILATERAL SILCONE BREAST IMPLANTS;  Surgeon: Wallace Going, DO;  Location: [facility];  Service: Plastics;  Laterality: Bilateral;  . WISDOM TOOTH EXTRACTION      Family History  Problem Relation Age of Onset  . Breast cancer Mother 24  . Liver cancer Mother   . Lung cancer Father 35  . Breast cancer Sister 9  . Breast cancer Maternal Grandmother   . Ovarian cancer Maternal Grandmother   . Colon cancer Maternal Grandmother   . Cancer Paternal Grandfather        NOS  . Melanoma Maternal Aunt        mother's maternal half sister    Social History:  reports that she quit smoking about 8 months ago. Her smoking use included Cigarettes. She has a 10.00 pack-year smoking history. She has never used smokeless  tobacco. She reports that she drinks alcohol. She reports that she does not use drugs.  Allergies:  Allergies  Allergen Reactions  . Wellbutrin [Bupropion] Anaphylaxis  . Bee Venom     Prescriptions Prior to Admission  Medication Sig Dispense Refill Last Dose  . clonazePAM (KLONOPIN) 0.5 MG tablet Take 0.25 mg by mouth daily as needed for anxiety.   0 Past Week at Unknown time  . diazepam (VALIUM) 10 MG  tablet Take 10 mg by mouth daily as needed.    Past Month at Unknown time  . diclofenac sodium (VOLTAREN) 1 % GEL Apply 2 g topically daily as needed (pain).   More than a month at Unknown time  . esomeprazole (NEXIUM) 20 MG packet Take 20 mg by mouth daily.   Past Month at Unknown time  . ibuprofen (ADVIL,MOTRIN) 800 MG tablet Take 1 tablet (800 mg total) by mouth every 8 (eight) hours as needed (mild pain). 30 tablet 1   . oxymetazoline (AFRIN) 0.05 % nasal spray Place 1 spray into both nostrils daily as needed for congestion.   More than a month at Unknown time  . Pseudoephedrine HCl (SUDAFED 24 HOUR PO) Take 1 tablet by mouth daily as needed (congestion).   Past Month at Unknown time  . ranitidine (ZANTAC 75) 75 MG tablet Take 1 tablet (75 mg total) by mouth at bedtime. 30 tablet 1 More than a month at Unknown time  . sertraline (ZOLOFT) 100 MG tablet Take 150 mg by mouth at bedtime.    09/14/2016 at Unknown time    ROS  Blood pressure (!) 144/76, pulse 90, temperature 98.9 F (37.2 C), resp. rate 18, last menstrual period 09/07/2016, SpO2 95 %. Physical Exam  Constitutional: She is oriented to person, place, and time. She appears well-developed and well-nourished.  Respiratory: Effort normal.  GI: Soft. There is no rebound and no guarding.    Musculoskeletal: Normal range of motion. She exhibits tenderness.       Left lower leg: She exhibits tenderness. She exhibits no swelling and no edema.  Neurological: She is alert and oriented to person, place, and time.  Skin: Skin is warm and dry.  Psychiatric: She has a normal mood and affect. Her behavior is normal.    Results for orders placed or performed during the hospital encounter of 09/16/16 (from the past 24 hour(s))  CBC with Differential/Platelet     Status: Abnormal   Collection Time: 09/16/16  6:13 PM  Result Value Ref Range   WBC 20.5 (H) 4.0 - 10.5 K/uL   RBC 4.01 3.87 - 5.11 MIL/uL   Hemoglobin 12.0 12.0 - 15.0 g/dL   HCT  36.1 36.0 - 46.0 %   MCV 90.0 78.0 - 100.0 fL   MCH 29.9 26.0 - 34.0 pg   MCHC 33.2 30.0 - 36.0 g/dL   RDW 13.7 11.5 - 15.5 %   Platelets 149 (L) 150 - 400 K/uL   Neutrophils Relative % 85 %   Neutro Abs 17.3 (H) 1.7 - 7.7 K/uL   Lymphocytes Relative 11 %   Lymphs Abs 2.3 0.7 - 4.0 K/uL   Monocytes Relative 4 %   Monocytes Absolute 0.8 0.1 - 1.0 K/uL   Eosinophils Relative 0 %   Eosinophils Absolute 0.1 0.0 - 0.7 K/uL   Basophils Relative 0 %   Basophils Absolute 0.0 0.0 - 0.1 K/uL  Urinalysis, Routine w reflex microscopic     Status: Abnormal   Collection Time: 09/16/16  6:13 PM  Result Value Ref Range   Color, Urine COLORLESS (A) YELLOW   APPearance CLEAR CLEAR   Specific Gravity, Urine 1.002 (L) 1.005 - 1.030   pH 6.0 5.0 - 8.0   Glucose, UA NEGATIVE NEGATIVE mg/dL   Hgb urine dipstick SMALL (A) NEGATIVE   Bilirubin Urine NEGATIVE NEGATIVE   Ketones, ur NEGATIVE NEGATIVE mg/dL   Protein, ur NEGATIVE NEGATIVE mg/dL   Nitrite NEGATIVE NEGATIVE   Leukocytes, UA NEGATIVE NEGATIVE   RBC / HPF 0-5 0 - 5 RBC/hpf   WBC, UA NONE SEEN 0 - 5 WBC/hpf   Bacteria, UA NONE SEEN NONE SEEN   Squamous Epithelial / LPF NONE SEEN NONE SEEN    Dg Chest 2 View  Result Date: 09/16/2016 CLINICAL DATA:  Postop fever hysterectomy high white count EXAM: CHEST  2 VIEW COMPARISON:  10/01/2007 FINDINGS: The heart size and mediastinal contours are within normal limits. Both lungs are clear. The visualized skeletal structures are unremarkable. Surgical clips over the reconstructed breasts bilaterally. IMPRESSION: No active cardiopulmonary disease. Electronically Signed   By: Donavan Foil M.D.   On: 09/16/2016 20:46    Assessment/Plan: 46yo POD#1 with left lower extremity pain, fever and leukocytosis -Admit in obs -AM labs -Presumed source of leg pain is positional r/t surgery; will tx with pain meds prn   Raegyn Renda 09/16/2016, 10:15 PM

## 2016-09-16 NOTE — Progress Notes (Signed)
Called [phone] and asked for vascular lab tech on call.  Waiting for return call.

## 2016-09-16 NOTE — MAU Provider Note (Signed)
Chief Complaint: Leg Pain   First Provider Initiated Contact with Patient 09/16/16 1804      SUBJECTIVE HPI: Lynn Simpson is a 46 y.o. G3P3003 on POD#1 following LAVH BSO who presents to maternity admissions reporting pain in her left lower leg starting today. The pain is new onset severe, sharp, constant pain, worse when standing, improved when sitting/lying down. There is no swelling or warmth in either leg.  She does report low grade fever of 99 at home, and was treated for fever at the hospital early this morning and discharged when she remained afebrile x several hours.   Her pain is well controlled with ibuprofen, which she last took 3 hours prior to arrival in MAU.  She denies abdominal pain, shortness of breath, chest pain, or h/a.   HPI  Past Medical History:  Diagnosis Date  . Anxiety   . Cancer (HCC)    Left Breast  . Depression   . Family history of breast cancer   . Family history of melanoma   . Family history of ovarian cancer   . Gallstones   . GERD (gastroesophageal reflux disease)   . IBS (irritable bowel syndrome)   . Lynch syndrome   . Migraine   . Obesity   . Ovarian cyst    Past Surgical History:  Procedure Laterality Date  . BREAST RECONSTRUCTION WITH PLACEMENT OF TISSUE EXPANDER AND FLEX HD (ACELLULAR HYDRATED DERMIS) Bilateral 03/31/2016   Procedure: BILATERAL BREAST RECONSTRUCTION WITH PLACEMENT OF TISSUE EXPANDER AND FLEX HD (ACELLULAR HYDRATED DERMIS);  Surgeon: Wallace Going, DO;  Location: [facility];  Service: Plastics;  Laterality: Bilateral;  . CESAREAN SECTION  2005  . COLONOSCOPY    . LAPAROSCOPIC VAGINAL HYSTERECTOMY WITH SALPINGO OOPHORECTOMY Bilateral 09/15/2016   Procedure: LAPAROSCOPIC ASSISTED VAGINAL HYSTERECTOMY WITH BILATERAL SALPINGO OOPHORECTOMY;  Surgeon: Molli Posey, MD;  Location: [facility];  Service: Gynecology;  Laterality: Bilateral;  . LIPOSUCTION Bilateral 06/30/2016   Procedure: LIPOSUCTION TO LATERAL  CHEST;  Surgeon: Wallace Going, DO;  Location: [facility];  Service: Plastics;  Laterality: Bilateral;  . NIPPLE SPARING MASTECTOMY Bilateral 03/31/2016   Procedure: BILATERAL NIPPLE SPARING MASTECTOMIES;  Surgeon: Autumn Messing III, MD;  Location: [facility];  Service: General;  Laterality: Bilateral;  . REMOVAL OF BILATERAL TISSUE EXPANDERS WITH PLACEMENT OF BILATERAL BREAST IMPLANTS Bilateral 06/30/2016   Procedure: REMOVAL OF BILATERAL TISSUE EXPANDERS WITH PLACEMENT OF BILATERAL SILCONE BREAST IMPLANTS;  Surgeon: Wallace Going, DO;  Location: [place] [facility];  Service: Plastics;  Laterality: Bilateral;  . WISDOM TOOTH EXTRACTION     Social History   Social History  . Marital status: Married    Spouse name: N/A  . Number of children: 3  . Years of education: N/A   Occupational History  . Not on file.   Social History Main Topics  . Smoking status: Former Smoker    Packs/day: 0.50    Years: 20.00    Types: Cigarettes    Quit date: 12/20/2015  . Smokeless tobacco: Never Used     Comment: form given [id number]  . Alcohol use Yes     Comment: occ  . Drug use: No  . Sexual activity: Yes    Birth control/ protection: IUD     Comment: IUD REMOVED 09/07/16   Other Topics Concern  . Not on file   Social History Narrative  . No narrative on file   No current facility-administered medications on file  prior to encounter.    Current Outpatient Prescriptions on File Prior to Encounter  Medication Sig Dispense Refill  . clonazePAM (KLONOPIN) 0.5 MG tablet Take 0.25 mg by mouth daily as needed for anxiety.   0  . diazepam (VALIUM) 10 MG tablet Take 10 mg by mouth daily as needed.     . diclofenac sodium (VOLTAREN) 1 % GEL Apply 2 g topically daily as needed (pain).    Marland Kitchen esomeprazole (NEXIUM) 20 MG packet Take 20 mg by mouth daily.    Marland Kitchen ibuprofen (ADVIL,MOTRIN) 800 MG tablet Take 1 tablet (800 mg total) by mouth every 8 (eight) hours as  needed (mild pain). 30 tablet 1  . oxymetazoline (AFRIN) 0.05 % nasal spray Place 1 spray into both nostrils daily as needed for congestion.    . Pseudoephedrine HCl (SUDAFED 24 HOUR PO) Take 1 tablet by mouth daily as needed (congestion).    . ranitidine (ZANTAC 75) 75 MG tablet Take 1 tablet (75 mg total) by mouth at bedtime. 30 tablet 1  . sertraline (ZOLOFT) 100 MG tablet Take 150 mg by mouth at bedtime.      Allergies  Allergen Reactions  . Wellbutrin [Bupropion] Anaphylaxis  . Bee Venom     ROS:  Review of Systems  Constitutional: Positive for fever. Negative for chills and fatigue.  Respiratory: Negative for shortness of breath.   Cardiovascular: Negative for chest pain.  Genitourinary: Negative for difficulty urinating, dysuria, flank pain, pelvic pain, vaginal bleeding, vaginal discharge and vaginal pain.  Musculoskeletal: Positive for myalgias.       Lower left leg pain  Neurological: Negative for dizziness and headaches.  Psychiatric/Behavioral: Negative.      I have reviewed patient's Past Medical Hx, Surgical Hx, Family Hx, Social Hx, medications and allergies.   Physical Exam   Patient Vitals for the past 24 hrs:  BP Temp Pulse Resp SpO2  09/16/16 1733 - - - - 95 %  09/16/16 1732 (!) 144/76 98.9 F (37.2 C) 90 18 -   Constitutional: Well-developed, well-nourished female in no acute distress.  HEART: normal rate, heart sounds, regular rhythm RESP: normal effort, lung sounds clear and equal bilaterally GI: Abd soft, non-tender. Pos BS x 4 MS: Extremities nontender, no edema, normal ROM Neurologic: Alert and oriented x 4.  GU: Neg CVAT.   LAB RESULTS Results for orders placed or performed during the hospital encounter of 09/16/16 (from the past 24 hour(s))  CBC with Differential/Platelet     Status: Abnormal   Collection Time: 09/16/16  6:13 PM  Result Value Ref Range   WBC 20.5 (H) 4.0 - 10.5 K/uL   RBC 4.01 3.87 - 5.11 MIL/uL   Hemoglobin 12.0 12.0 -  15.0 g/dL   HCT 36.1 36.0 - 46.0 %   MCV 90.0 78.0 - 100.0 fL   MCH 29.9 26.0 - 34.0 pg   MCHC 33.2 30.0 - 36.0 g/dL   RDW 13.7 11.5 - 15.5 %   Platelets 149 (L) 150 - 400 K/uL   Neutrophils Relative % 85 %   Neutro Abs 17.3 (H) 1.7 - 7.7 K/uL   Lymphocytes Relative 11 %   Lymphs Abs 2.3 0.7 - 4.0 K/uL   Monocytes Relative 4 %   Monocytes Absolute 0.8 0.1 - 1.0 K/uL   Eosinophils Relative 0 %   Eosinophils Absolute 0.1 0.0 - 0.7 K/uL   Basophils Relative 0 %   Basophils Absolute 0.0 0.0 - 0.1 K/uL  Urinalysis, Routine w reflex  microscopic     Status: Abnormal   Collection Time: 09/16/16  6:13 PM  Result Value Ref Range   Color, Urine COLORLESS (A) YELLOW   APPearance CLEAR CLEAR   Specific Gravity, Urine 1.002 (L) 1.005 - 1.030   pH 6.0 5.0 - 8.0   Glucose, UA NEGATIVE NEGATIVE mg/dL   Hgb urine dipstick SMALL (A) NEGATIVE   Bilirubin Urine NEGATIVE NEGATIVE   Ketones, ur NEGATIVE NEGATIVE mg/dL   Protein, ur NEGATIVE NEGATIVE mg/dL   Nitrite NEGATIVE NEGATIVE   Leukocytes, UA NEGATIVE NEGATIVE   RBC / HPF 0-5 0 - 5 RBC/hpf   WBC, UA NONE SEEN 0 - 5 WBC/hpf   Bacteria, UA NONE SEEN NONE SEEN   Squamous Epithelial / LPF NONE SEEN NONE SEEN    --/--/O POS, O POS (06/20 1110)  IMAGING No results found.  MAU Management/MDM: Ordered labs and reviewed results.  Pt afebrile while in MAU.  Venous doppler of LLE done in MAU today with negative findings, no evidence of DVT. UA without indication of UTI.  No significant postoperative abdominal pain so no signs of postoperative abdominal infection.  No SOB or chest pain and clear lung sounds so no evidence of pneumonia. WBCs 20, doubled from this morning at her discharge.  DVT ruled out but no clear reason for fever or leukocytosis.  Consult Dr Lynnette Caffey with assessment and findings.  Admit to 3rd floor for observation. Chest X-Ray prior to admission.  NS @ 125/hour.   Pt stable at time of transfer.  ASSESSMENT 1. Pain of left lower  extremity   2. Status post laparoscopic assisted vaginal hysterectomy   3. Leukocytosis, unspecified type   4. Postoperative fever     PLAN Admit to 3rd floor for observation CXR pending NS @ 125 Dr Lynnette Caffey to see pt  Fatima Blank Certified Nurse-Midwife 09/16/2016  8:39 PM

## 2016-09-16 NOTE — Discharge Summary (Signed)
Physician Discharge Summary  Patient ID: Lynn Simpson MRN: [id number] DOB/AGE: [birth year] 44 y.o.  Admit date: 09/15/2016 Discharge date: 09/16/2016  Admission Diagnoses:Hx Lynch syndrome  Discharge Diagnoses: same>>LAVH BSO Active Problems:   Lynch syndrome   Discharged Condition: good  Hospital Course: adm for LAVH + BSO, had 1 post op temp elevation and was obsv on POD 1 and remained afeb  Consults: None  Significant Diagnostic Studies: labs:  Results for orders placed or performed during the hospital encounter of 09/15/16 (from the past 24 hour(s))  CBC     Status: Abnormal   Collection Time: 09/16/16  5:59 AM  Result Value Ref Range   WBC 10.8 (H) 4.0 - 10.5 K/uL   RBC 4.46 3.87 - 5.11 MIL/uL   Hemoglobin 13.3 12.0 - 15.0 g/dL   HCT 40.4 36.0 - 46.0 %   MCV 90.6 78.0 - 100.0 fL   MCH 29.8 26.0 - 34.0 pg   MCHC 32.9 30.0 - 36.0 g/dL   RDW 13.6 11.5 - 15.5 %   Platelets 145 (L) 150 - 400 K/uL    Treatments: surgery: LAVH BSO  Discharge Exam: Blood pressure (!) 132/57, pulse (!) 110, temperature 99.8 F (37.7 C), temperature source Oral, resp. rate 18, height 5\' 2"  (1.575 m), weight 249 lb (112.9 kg), last menstrual period 09/07/2016, SpO2 95 %. abd soft flat , Incs C/D  Disposition: 01-Home or Self Care   Allergies as of 09/16/2016      Reactions   Wellbutrin [bupropion] Anaphylaxis   Bee Venom       Medication List    STOP taking these medications   HYDROcodone-acetaminophen 5-325 MG tablet Commonly known as:  NORCO/VICODIN     TAKE these medications   clonazePAM 0.5 MG tablet Commonly known as:  KLONOPIN Take 0.25 mg by mouth daily as needed for anxiety.   diazepam 10 MG tablet Commonly known as:  VALIUM Take 10 mg by mouth daily as needed.   diclofenac sodium 1 % Gel Commonly known as:  VOLTAREN Apply 2 g topically daily as needed (pain).   esomeprazole 20 MG packet Commonly known as:  NEXIUM Take 20 mg by mouth daily.   ibuprofen 800 MG  tablet Commonly known as:  ADVIL,MOTRIN Take 1 tablet (800 mg total) by mouth every 8 (eight) hours as needed (mild pain). What changed:  reasons to take this   oxymetazoline 0.05 % nasal spray Commonly known as:  AFRIN Place 1 spray into both nostrils daily as needed for congestion.   ranitidine 75 MG tablet Commonly known as:  ZANTAC 75 Take 1 tablet (75 mg total) by mouth at bedtime.   sertraline 100 MG tablet Commonly known as:  ZOLOFT Take 150 mg by mouth at bedtime.   SUDAFED 24 HOUR PO Take 1 tablet by mouth daily as needed (congestion).        SignedMargarette Asal 09/16/2016, 9:12 AM

## 2016-09-16 NOTE — MAU Note (Signed)
Pt presents to MAU with complaints of left leg pain that started yesterday after she had a hysterectomy and has gotten worse throughout the day

## 2016-09-17 ENCOUNTER — Encounter (HOSPITAL_COMMUNITY): Payer: Self-pay

## 2016-09-17 LAB — CBC WITH DIFFERENTIAL/PLATELET
Basophils Absolute: 0 10*3/uL (ref 0.0–0.1)
Basophils Relative: 0 %
Eosinophils Absolute: 0.2 10*3/uL (ref 0.0–0.7)
Eosinophils Relative: 1 %
HCT: 35.6 % — ABNORMAL LOW (ref 36.0–46.0)
Hemoglobin: 11.9 g/dL — ABNORMAL LOW (ref 12.0–15.0)
Lymphocytes Relative: 13 %
Lymphs Abs: 1.8 10*3/uL (ref 0.7–4.0)
MCH: 30.1 pg (ref 26.0–34.0)
MCHC: 33.4 g/dL (ref 30.0–36.0)
MCV: 89.9 fL (ref 78.0–100.0)
Monocytes Absolute: 0.5 10*3/uL (ref 0.1–1.0)
Monocytes Relative: 4 %
Neutro Abs: 11.8 10*3/uL — ABNORMAL HIGH (ref 1.7–7.7)
Neutrophils Relative %: 82 %
Platelets: 134 10*3/uL — ABNORMAL LOW (ref 150–400)
RBC: 3.96 MIL/uL (ref 3.87–5.11)
RDW: 13.7 % (ref 11.5–15.5)
WBC: 14.3 10*3/uL — ABNORMAL HIGH (ref 4.0–10.5)

## 2016-09-17 LAB — COMPREHENSIVE METABOLIC PANEL
ALT: 24 U/L (ref 14–54)
AST: 23 U/L (ref 15–41)
Albumin: 3.2 g/dL — ABNORMAL LOW (ref 3.5–5.0)
Alkaline Phosphatase: 88 U/L (ref 38–126)
Anion gap: 7 (ref 5–15)
BUN: 6 mg/dL (ref 6–20)
CO2: 26 mmol/L (ref 22–32)
Calcium: 8.4 mg/dL — ABNORMAL LOW (ref 8.9–10.3)
Chloride: 105 mmol/L (ref 101–111)
Creatinine, Ser: 0.63 mg/dL (ref 0.44–1.00)
GFR calc Af Amer: >60 mL/min (ref >60)
GFR calc non Af Amer: >60 mL/min (ref >60)
Glucose, Bld: 85 mg/dL (ref 65–99)
Potassium: 3.5 mmol/L (ref 3.5–5.1)
Sodium: 138 mmol/L (ref 135–145)
Total Bilirubin: 0.6 mg/dL (ref 0.3–1.2)
Total Protein: 6 g/dL — ABNORMAL LOW (ref 6.5–8.1)

## 2016-09-17 NOTE — Discharge Summary (Signed)
Physician Discharge Summary  Patient ID: Lynn Simpson MRN: [id number] DOB/AGE: 44/23/1972 44 y.o.  Admit date: 09/16/2016 Discharge date: 09/17/2016  Admission Diagnoses: Postop fever and left lower extremity pain  Discharge Diagnoses: SAA Active Problems:   Postoperative fever   Discharged Condition: good  Hospital Course: The patient was admitted for close observation on POD#1 s/p uncomplicated risk reducing LAVH, BSO with complaint of left lower extremity pain and fever.  On presentation CXR and venous duplex Doppler were negative.  WBC 20 with left shift.  The patient received no antibiotic and felt improved on HD2 also with improvement in WBC (14).  She was discharged home after improvement.  Consults: None  Significant Diagnostic Studies: labs: WBC 20->14 and radiology: CXR: normal and VDD normal  Treatments: IV hydration  Discharge Exam: Blood pressure 126/63, pulse 96, temperature 98.4 F (36.9 C), temperature source Oral, resp. rate 18, height 5' 2.01" (1.575 m), weight 249 lb (112.9 kg), last menstrual period 09/07/2016, SpO2 95 %. General appearance: alert, cooperative and appears stated age GI: inc c/d/i x 2; stable ecchymosis around suprapubic port Extremities: extremities normal, atraumatic, no cyanosis or edema Incision/Wound: c/d/i x 2  Disposition: 01-Home or Self Care   Allergies as of 09/17/2016      Reactions   Wellbutrin [bupropion] Anaphylaxis   Bee Venom       Medication List    TAKE these medications   clonazePAM 0.5 MG tablet Commonly known as:  KLONOPIN Take 0.25 mg by mouth daily as needed for anxiety.   diazepam 10 MG tablet Commonly known as:  VALIUM Take 10 mg by mouth daily as needed.   diclofenac sodium 1 % Gel Commonly known as:  VOLTAREN Apply 2 g topically daily as needed (pain).   esomeprazole 20 MG packet Commonly known as:  NEXIUM Take 20 mg by mouth daily.   ibuprofen 800 MG tablet Commonly known as:  ADVIL,MOTRIN Take  1 tablet (800 mg total) by mouth every 8 (eight) hours as needed (mild pain).   oxymetazoline 0.05 % nasal spray Commonly known as:  AFRIN Place 1 spray into both nostrils daily as needed for congestion.   ranitidine 75 MG tablet Commonly known as:  ZANTAC 75 Take 1 tablet (75 mg total) by mouth at bedtime.   sertraline 100 MG tablet Commonly known as:  ZOLOFT Take 150 mg by mouth at bedtime.   SUDAFED 24 HOUR PO Take 1 tablet by mouth daily as needed (congestion).        SignedLinda Hedges 09/17/2016, 9:20 AM

## 2016-09-17 NOTE — Discharge Instructions (Signed)
Call MD for T>100.4, heavy vaginal bleeding, severe abdominal pain, intractable nausea and/or vomiting, or respiratory distress.  Call office to schedule follow up on Monday or Tuesday.  Pelvic rest x 6 weeks.  No driving while taking narcotics.

## 2016-09-17 NOTE — Progress Notes (Signed)
Subjective: Patient reports tolerating PO, + flatus, + BM and no problems voiding.  Feeling much better.  Leg pain unimproved but no worse; positional and no weakness or paresthesia.  Adequate postop pain control with ibuprofen.  No CP/SOB.  No subjective fever or chills since admission.  No vaginal bleeding.  Objective: I have reviewed patient's vital signs, medications and labs. AF this admission. WBC 20.5->14.3  General: alert, cooperative and appears stated age Extremities: extremities normal, atraumatic, no cyanosis or edema Inc c/d/i x 2; stable ecchymosis surrounding suprapubic port   Assessment/Plan: Postop left lower extremity pain and leukocytosis -Left lower extremity pain-DVT ruled out.  Pain is positional and with no findings on physical exam, likely related to intraop positioning.  Plan to reeval in office on Monday or Tuesday of next week -Leukocytosis and subjective fever-resolved with no antibiotic.  CXR wnl.  Physical exam unremarkable.   -D/C home with f/u in office Monday or Tuesday.   LOS: 1 day    Letisia Schwalb 09/17/2016, 9:09 AM

## 2016-09-17 NOTE — Progress Notes (Signed)
Patient discharged home with family. Discharge teaching, home care, reasons to seek care, and pain management discussed. No questions at this time.

## 2016-10-24 DIAGNOSIS — R350 Frequency of micturition: Secondary | ICD-10-CM | POA: Diagnosis not present

## 2016-10-28 DIAGNOSIS — Z0000 Encounter for general adult medical examination without abnormal findings: Secondary | ICD-10-CM | POA: Diagnosis not present

## 2016-11-04 DIAGNOSIS — E7800 Pure hypercholesterolemia, unspecified: Secondary | ICD-10-CM | POA: Diagnosis not present

## 2016-11-04 DIAGNOSIS — Z1389 Encounter for screening for other disorder: Secondary | ICD-10-CM | POA: Diagnosis not present

## 2016-11-04 DIAGNOSIS — F17200 Nicotine dependence, unspecified, uncomplicated: Secondary | ICD-10-CM | POA: Diagnosis not present

## 2016-11-04 DIAGNOSIS — Z0000 Encounter for general adult medical examination without abnormal findings: Secondary | ICD-10-CM | POA: Diagnosis not present

## 2016-11-04 DIAGNOSIS — K219 Gastro-esophageal reflux disease without esophagitis: Secondary | ICD-10-CM | POA: Diagnosis not present

## 2016-12-26 DIAGNOSIS — R197 Diarrhea, unspecified: Secondary | ICD-10-CM | POA: Diagnosis not present

## 2016-12-26 DIAGNOSIS — R10817 Generalized abdominal tenderness: Secondary | ICD-10-CM | POA: Diagnosis not present

## 2016-12-26 DIAGNOSIS — K921 Melena: Secondary | ICD-10-CM | POA: Diagnosis not present

## 2016-12-26 DIAGNOSIS — J449 Chronic obstructive pulmonary disease, unspecified: Secondary | ICD-10-CM | POA: Diagnosis not present

## 2016-12-29 DIAGNOSIS — R197 Diarrhea, unspecified: Secondary | ICD-10-CM | POA: Diagnosis not present

## 2017-03-28 DIAGNOSIS — Z9889 Other specified postprocedural states: Secondary | ICD-10-CM | POA: Diagnosis not present

## 2017-04-26 DIAGNOSIS — J3089 Other allergic rhinitis: Secondary | ICD-10-CM | POA: Diagnosis not present

## 2017-04-26 DIAGNOSIS — K219 Gastro-esophageal reflux disease without esophagitis: Secondary | ICD-10-CM | POA: Diagnosis not present

## 2017-04-26 DIAGNOSIS — J449 Chronic obstructive pulmonary disease, unspecified: Secondary | ICD-10-CM | POA: Diagnosis not present

## 2017-04-26 DIAGNOSIS — R55 Syncope and collapse: Secondary | ICD-10-CM | POA: Diagnosis not present

## 2017-05-24 ENCOUNTER — Encounter: Payer: Self-pay | Admitting: Gastroenterology

## 2017-06-05 ENCOUNTER — Telehealth: Payer: Self-pay | Admitting: Gastroenterology

## 2017-06-05 NOTE — Telephone Encounter (Signed)
Left message on machine to call back  

## 2017-06-05 NOTE — Telephone Encounter (Signed)
Lower left side abd pain, nausea, diarrhea began on Friday.  No fever, fecal incontinence.  Never had this before.  No sick contacts but she is around kids a lot.  Appt made to see Lynn Simpson on 06/09/17.  She will call if she worsens prior to that appt. She does have previsit and colon scheduled .

## 2017-06-09 ENCOUNTER — Ambulatory Visit: Payer: BLUE CROSS/BLUE SHIELD | Admitting: Physician Assistant

## 2017-07-05 ENCOUNTER — Other Ambulatory Visit: Payer: Self-pay

## 2017-07-05 ENCOUNTER — Ambulatory Visit (AMBULATORY_SURGERY_CENTER): Payer: Self-pay | Admitting: *Deleted

## 2017-07-05 VITALS — Ht 62.0 in | Wt 268.0 lb

## 2017-07-05 DIAGNOSIS — Z1509 Genetic susceptibility to other malignant neoplasm: Secondary | ICD-10-CM

## 2017-07-05 DIAGNOSIS — Z86010 Personal history of colonic polyps: Secondary | ICD-10-CM

## 2017-07-05 MED ORDER — PEG 3350-KCL-NA BICARB-NACL 420 G PO SOLR: 4000.0000 mL | Freq: Once | ORAL | 0 refills | Status: AC

## 2017-07-05 NOTE — Progress Notes (Signed)
No egg or soy allergy known to patient  No issues with past sedation with any surgeries  or procedures, no intubation problems  No diet pills per patient No home 02 use per patient  No blood thinners per patient  Pt denies issues with constipation  No A fib or A flutter  EMMI video sent to pt's e mail - pt watched last time  Pt states she is having abd pain daily- she had made an appt to see Dr Ardis Hughs, it got better so she cancelled and now it's back- she has see some rectal bleeding but she has hems and thinks that's the cause, she has no recent vomiting, no diarrhea- lower abdomen per pt- she wishes to talk to DrJacobs the morning of her colon 5-7 about this- I offered OV but pt declined

## 2017-07-11 ENCOUNTER — Encounter: Payer: Self-pay | Admitting: Gastroenterology

## 2017-07-21 ENCOUNTER — Encounter: Payer: BLUE CROSS/BLUE SHIELD | Admitting: Gastroenterology

## 2017-07-25 ENCOUNTER — Ambulatory Visit (AMBULATORY_SURGERY_CENTER): Payer: BLUE CROSS/BLUE SHIELD | Admitting: Gastroenterology

## 2017-07-25 ENCOUNTER — Other Ambulatory Visit: Payer: Self-pay

## 2017-07-25 ENCOUNTER — Encounter: Payer: Self-pay | Admitting: Gastroenterology

## 2017-07-25 VITALS — BP 142/84 | HR 78 | Temp 98.7°F | Resp 25 | Ht 62.0 in | Wt 268.0 lb

## 2017-07-25 DIAGNOSIS — D124 Benign neoplasm of descending colon: Secondary | ICD-10-CM | POA: Diagnosis not present

## 2017-07-25 DIAGNOSIS — Z86010 Personal history of colonic polyps: Secondary | ICD-10-CM

## 2017-07-25 DIAGNOSIS — K635 Polyp of colon: Secondary | ICD-10-CM | POA: Diagnosis not present

## 2017-07-25 DIAGNOSIS — Z1211 Encounter for screening for malignant neoplasm of colon: Secondary | ICD-10-CM | POA: Diagnosis not present

## 2017-07-25 DIAGNOSIS — D122 Benign neoplasm of ascending colon: Secondary | ICD-10-CM | POA: Diagnosis not present

## 2017-07-25 DIAGNOSIS — D126 Benign neoplasm of colon, unspecified: Secondary | ICD-10-CM | POA: Diagnosis not present

## 2017-07-25 DIAGNOSIS — D125 Benign neoplasm of sigmoid colon: Secondary | ICD-10-CM | POA: Diagnosis not present

## 2017-07-25 MED ORDER — SODIUM CHLORIDE 0.9 % IV SOLN: 500.0000 mL | Freq: Once | INTRAVENOUS | Status: DC

## 2017-07-25 NOTE — Progress Notes (Signed)
Called to room to assist during endoscopic procedure.  Patient ID and intended procedure confirmed with present staff. Received instructions for my participation in the procedure from the performing physician.  

## 2017-07-25 NOTE — Op Note (Signed)
[place] Patient Name: Lynn Simpson Procedure Date: 07/25/2017 1:21 PM MRN: [id number] Endoscopist: Milus Banister , MD Age: 47 Referring MD:  Date of Birth: 08/21/1970 Gender: Female Account #: [id number] Procedure:                Colonoscopy Indications:              High risk colon cancer surveillance: Personal                            history of colonic polyps and PH of LYNCH syndrome:                            colonoscopy 2018 several TAs and SSAs removed. Medicines:                Monitored Anesthesia Care Procedure:                Pre-Anesthesia Assessment:                           - Prior to the procedure, a History and Physical                            was performed, and patient medications and                            allergies were reviewed. The patient's tolerance of                            previous anesthesia was also reviewed. The risks                            and benefits of the procedure and the sedation                            options and risks were discussed with the patient.                            All questions were answered, and informed consent                            was obtained. Prior Anticoagulants: The patient has                            taken no previous anticoagulant or antiplatelet                            agents. ASA Grade Assessment: III - A patient with                            severe systemic disease. After reviewing the risks                            and benefits, the patient was deemed in  satisfactory condition to undergo the procedure.                           After obtaining informed consent, the colonoscope                            was passed under direct vision. Throughout the                            procedure, the patient's blood pressure, pulse, and                            oxygen saturations were monitored continuously. The                            Model  CF-HQ190L [id number]) scope was introduced                            through the anus and advanced to the the cecum,                            identified by appendiceal orifice and ileocecal                            valve. The colonoscopy was performed without                            difficulty. The patient tolerated the procedure                            well. The quality of the bowel preparation was                            good. The ileocecal valve, appendiceal orifice, and                            rectum were photographed. Scope In: 1:30:34 PM Scope Out: 1:46:39 PM Scope Withdrawal Time: 0 hours 12 minutes 55 seconds  Total Procedure Duration: 0 hours 16 minutes 5 seconds  Findings:                 Four sessile polyps were found in the sigmoid                            colon, descending colon and ascending colon. The                            polyps were 1 to 3 mm in size. These polyps were                            removed with a cold snare. Resection and retrieval                            were complete.  The exam was otherwise without abnormality on                            direct and retroflexion views. Complications:            No immediate complications. Estimated blood loss:                            None. Estimated Blood Loss:     Estimated blood loss: none. Impression:               - Four 1 to 3 mm polyps in the sigmoid colon, in                            the descending colon and in the ascending colon,                            removed with a cold snare. Resected and retrieved.                           - The examination was otherwise normal on direct                            and retroflexion views. Recommendation:           - Patient has a contact number available for                            emergencies. The signs and symptoms of potential                            delayed complications were discussed with the                             patient. Return to normal activities tomorrow.                            Written discharge instructions were provided to the                            patient.                           - Resume previous diet.                           - Continue present medications.                           - You will receive a letter within 2-3 weeks with                            the pathology results and my final recommendations.                            Given your known Lynch Syndrome  you will likely                            need repeat colonoscopy in 1 year.                           - Dr. Ardis Hughs' office will arrange abdominal US for                            your LUQ pain, vomiting. Milus Banister, MD 07/25/2017 1:52:08 PM This report has been signed electronically.

## 2017-07-25 NOTE — Progress Notes (Signed)
To PACU, VSS. Report to [email or web address] 

## 2017-07-25 NOTE — Progress Notes (Signed)
Pt's states no medical or surgical changes since previsit or office visit. 

## 2017-07-25 NOTE — Patient Instructions (Signed)
Handout given on polyps  YOU HAD AN ENDOSCOPIC PROCEDURE TODAY: Refer to the procedure report and other information in the discharge instructions given to you for any specific questions about what was found during the examination. If this information does not answer your questions, please call [facility] office at [phone] to clarify.   YOU SHOULD EXPECT: Some feelings of bloating in the abdomen. Passage of more gas than usual. Walking can help get rid of the air that was put into your GI tract during the procedure and reduce the bloating. If you had a lower endoscopy (such as a colonoscopy or flexible sigmoidoscopy) you may notice spotting of blood in your stool or on the toilet paper. Some abdominal soreness may be present for a day or two, also.  DIET: Your first meal following the procedure should be a light meal and then it is ok to progress to your normal diet. A half-sandwich or bowl of soup is an example of a good first meal. Heavy or fried foods are harder to digest and may make you feel nauseous or bloated. Drink plenty of fluids but you should avoid alcoholic beverages for 24 hours. If you had a esophageal dilation, please see attached instructions for diet.    ACTIVITY: Your care partner should take you home directly after the procedure. You should plan to take it easy, moving slowly for the rest of the day. You can resume normal activity the day after the procedure however YOU SHOULD NOT DRIVE, use power tools, machinery or perform tasks that involve climbing or major physical exertion for 24 hours (because of the sedation medicines used during the test).   SYMPTOMS TO REPORT IMMEDIATELY: A gastroenterologist can be reached at any hour. Please call [phone]  for any of the following symptoms:  Following lower endoscopy (colonoscopy, flexible sigmoidoscopy) Excessive amounts of blood in the stool  Significant tenderness, worsening of abdominal pains  Swelling of the abdomen that is  new, acute  Fever of 100 or higher    FOLLOW UP:  If any biopsies were taken you will be contacted by phone or by letter within the next 1-3 weeks. Call [phone]  if you have not heard about the biopsies in 3 weeks.  Please also call with any specific questions about appointments or follow up tests.  

## 2017-07-26 ENCOUNTER — Telehealth: Payer: Self-pay

## 2017-07-26 ENCOUNTER — Telehealth: Payer: Self-pay | Admitting: *Deleted

## 2017-07-26 DIAGNOSIS — R1012 Left upper quadrant pain: Secondary | ICD-10-CM

## 2017-07-26 DIAGNOSIS — R112 Nausea with vomiting, unspecified: Secondary | ICD-10-CM

## 2017-07-26 NOTE — Telephone Encounter (Signed)
The pt needs to have [place] per procedure report.  For LUQ pain and vomiting.

## 2017-07-26 NOTE — Telephone Encounter (Signed)
The pt has been instructed and will call with any questions   You have been scheduled for an abdominal ultrasound at [facility] (1st floor of hospital) on 07/28/17 at 1130 am. Please arrive 15 minutes prior to your appointment for registration. Make certain not to have anything to eat or drink 6 hours prior to your appointment. Should you need to reschedule your appointment, please contact radiology at [phone]. This test typically takes about 30 minutes to perform.

## 2017-07-26 NOTE — Telephone Encounter (Signed)
  Follow up Call-  Call back number 07/25/2017 05/18/2016  Post procedure Call Back phone  # [phone]  Permission to leave phone message Yes Yes  Some recent data might be hidden     Patient questions:  Do you have a fever, pain , or abdominal swelling? No. Pain Score  0 *  Have you tolerated food without any problems? Yes.    Have you been able to return to your normal activities? Yes.    Do you have any questions about your discharge instructions: Diet   No. Medications  No. Follow up visit  No.  Do you have questions or concerns about your Care? No.  Actions: * If pain score is 4 or above: No action needed, pain <4.

## 2017-07-28 ENCOUNTER — Ambulatory Visit (HOSPITAL_COMMUNITY)
Admission: RE | Admit: 2017-07-28 | Discharge: 2017-07-28 | Disposition: A | Discharge status: Home / Self Care | Payer: BLUE CROSS/BLUE SHIELD | Source: Ambulatory Visit | Attending: Gastroenterology | Admitting: Gastroenterology

## 2017-07-28 DIAGNOSIS — R1012 Left upper quadrant pain: Secondary | ICD-10-CM | POA: Diagnosis not present

## 2017-07-28 DIAGNOSIS — R112 Nausea with vomiting, unspecified: Secondary | ICD-10-CM | POA: Insufficient documentation

## 2017-07-28 IMAGING — US US ABDOMEN COMPLETE
1 series · 14 of 25 positions shown · non-contrast
Comparison: CT abdomen [DATE]

CLINICAL DATA: Left upper quadrant abdominal pain over the last 3
months with vomiting.

EXAM:
ABDOMEN ULTRASOUND COMPLETE

[Series 1: us abdomen complete · 14 of 120 slices shown]
[im 1/120]
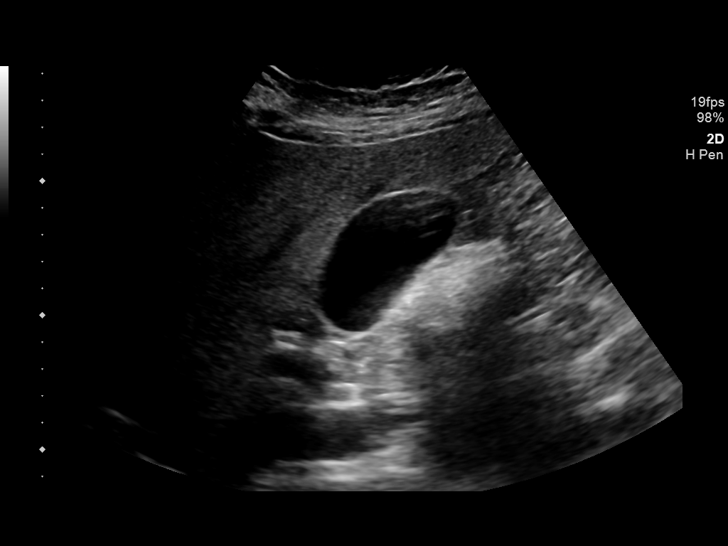
[im 10/120]
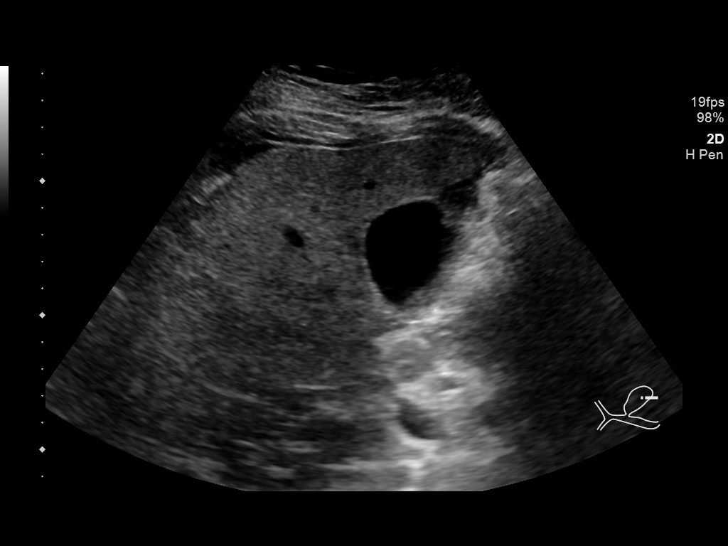
[im 20/120]
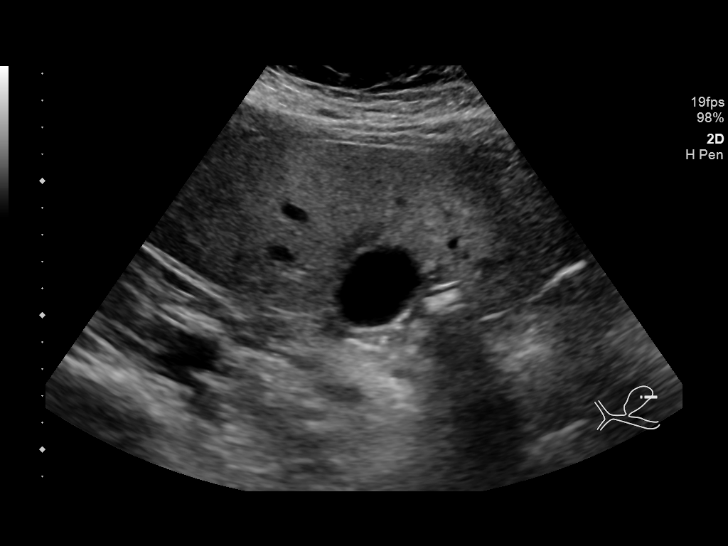
[im 30/120]
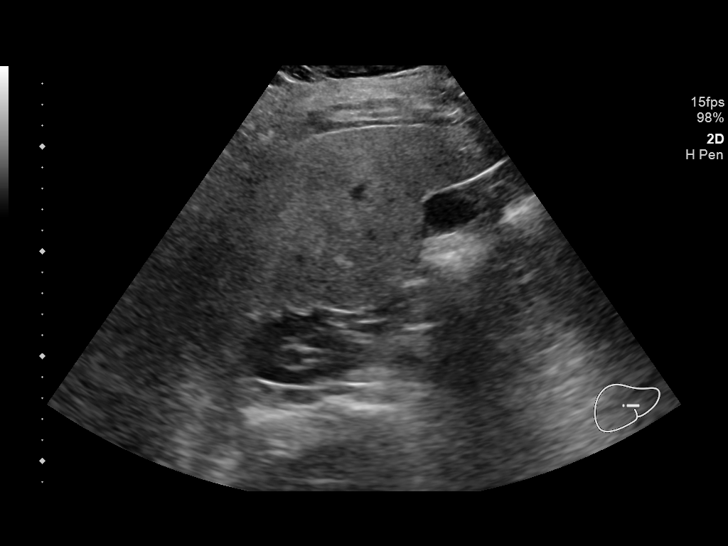
[im 40/120]
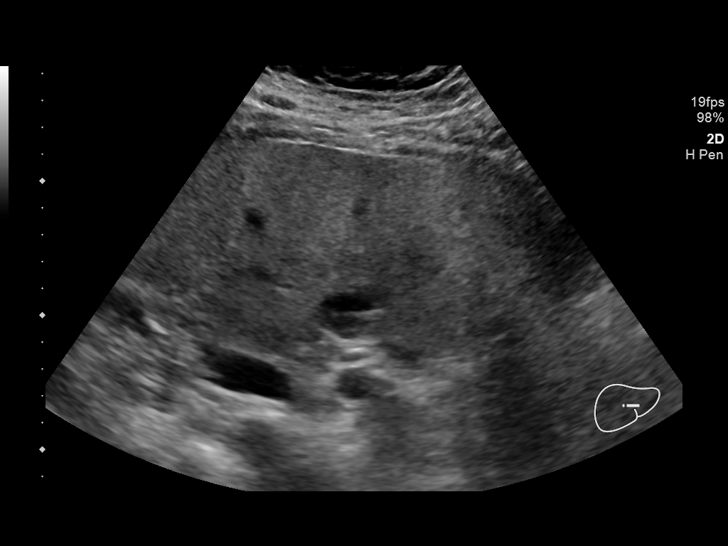
[im 45/120]
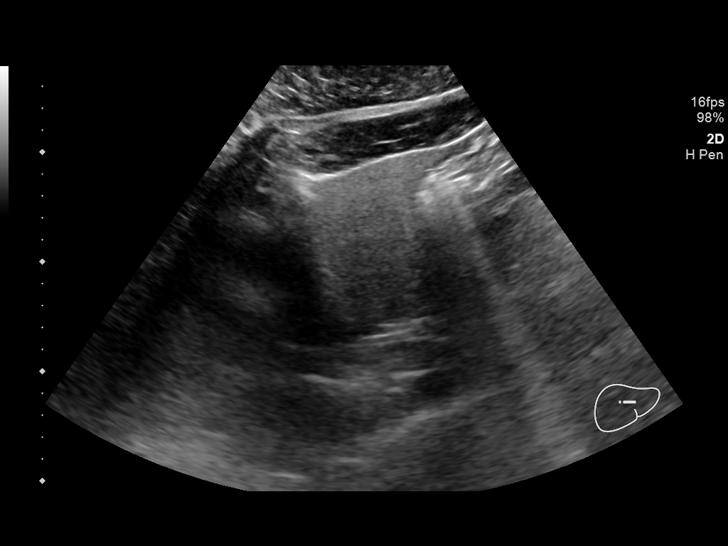
[im 55/120]
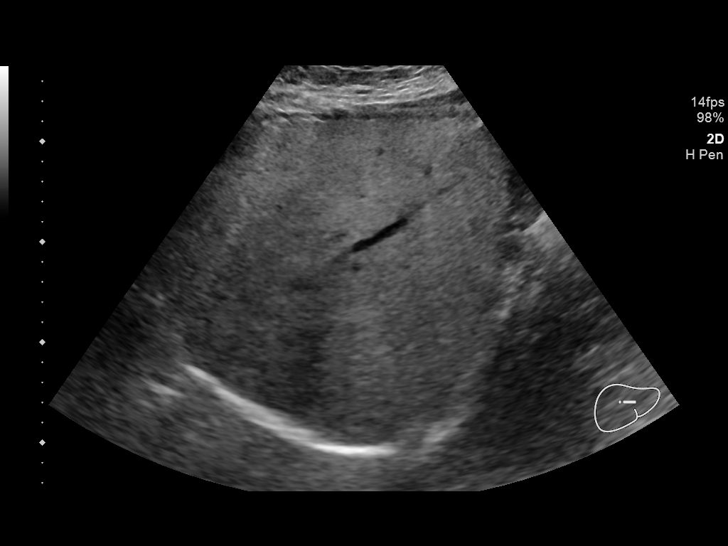
[im 65/120]
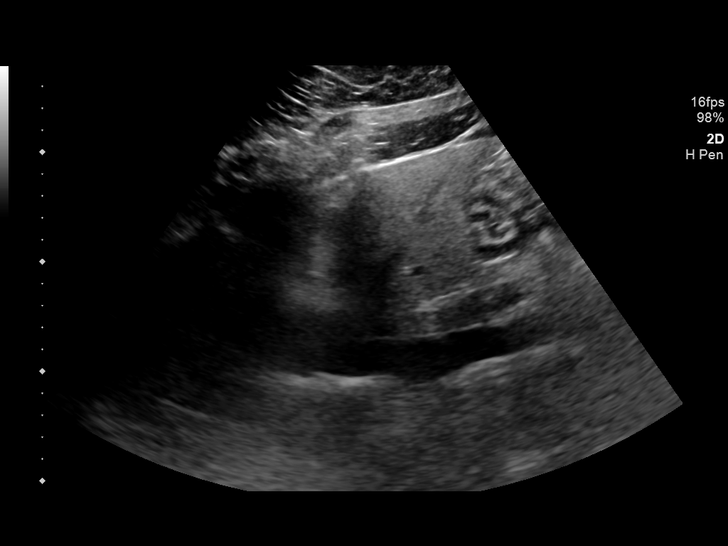
[im 75/120]
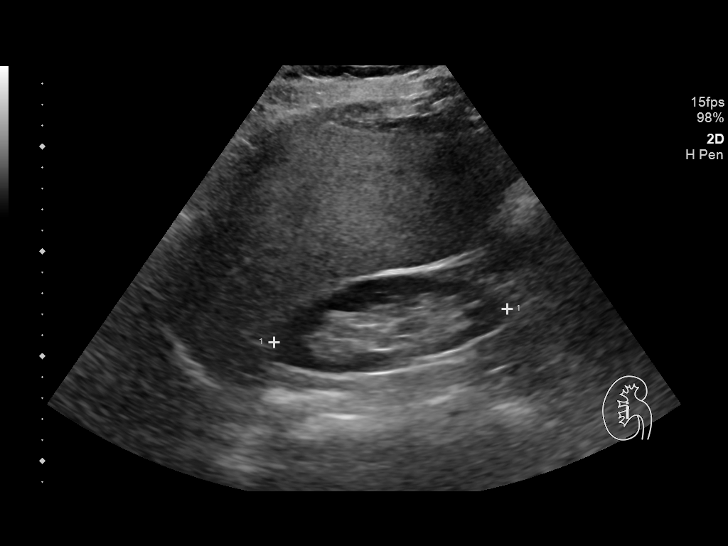
[im 80/120]
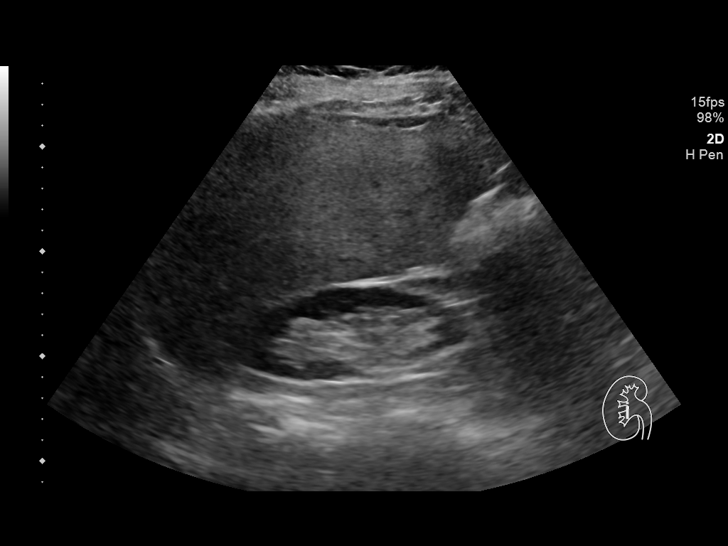
[im 90/120]
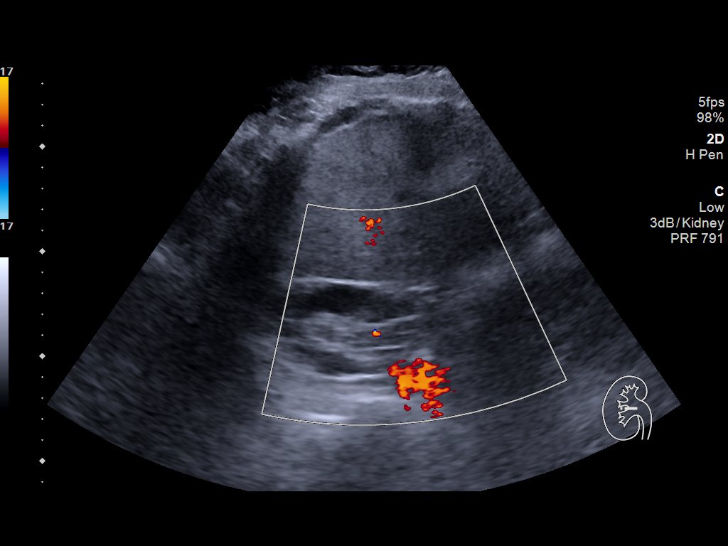
[im 100/120]
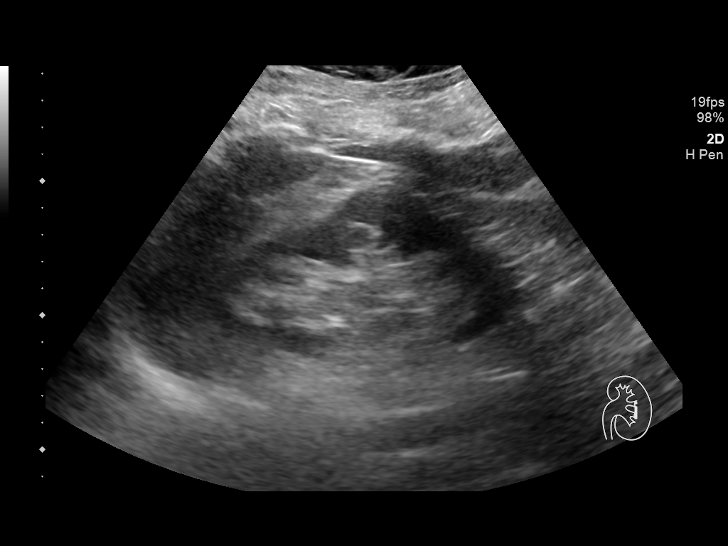
[im 110/120]
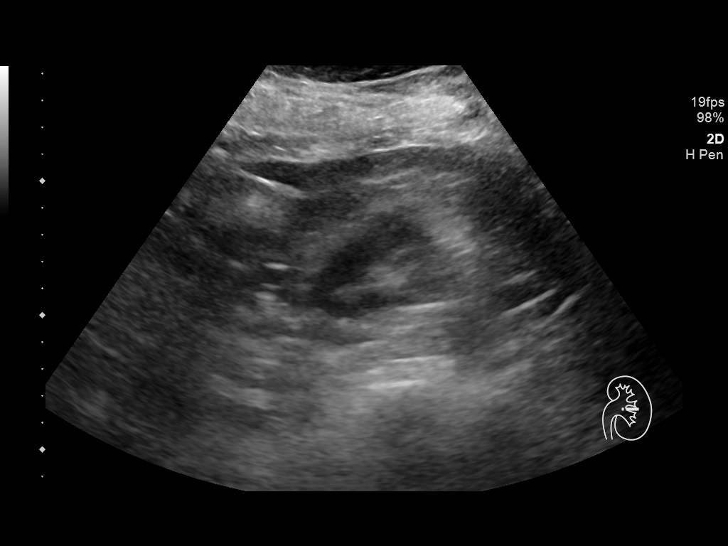
[im 120/120]
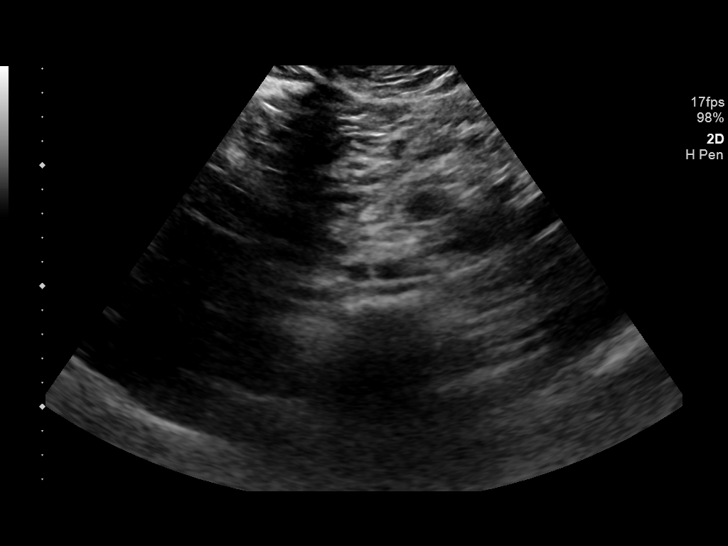

[14 of 25 positions shown; findings below may reference images not displayed]

FINDINGS: Gallbladder: No gallstones or wall thickening visualized. No
sonographic Murphy sign noted by sonographer.

Common bile duct: Diameter: 3 mm, normal

Liver: Mildly increased echogenicity suggesting fatty change. No
focal lesion or ductal dilatation. Portal vein is patent on color
Doppler imaging with normal direction of blood flow towards the
liver.

IVC: No abnormality visualized.

Pancreas: Visualized portion unremarkable.

Spleen: Size and appearance within normal limits.

Right Kidney: Length: 11.9 cm. Echogenicity within normal limits. No
mass or hydronephrosis visualized.

Left Kidney: Length: 11.6 cm. Echogenicity within normal limits. No
mass or hydronephrosis visualized.

Abdominal aorta: No aneurysm visualized.

Other findings: No ascites.
IMPRESSION: Mildly echogenic liver suggesting mild fatty change. No focal lesion
or ductal dilatation. Gallbladder appears normal. Spleen is normal
in size. No specific cause of the presenting symptoms is identified.

## 2017-08-01 ENCOUNTER — Encounter: Payer: Self-pay | Admitting: Gastroenterology

## 2017-09-29 ENCOUNTER — Ambulatory Visit: Payer: BLUE CROSS/BLUE SHIELD | Admitting: Gastroenterology

## 2017-11-07 DIAGNOSIS — Z0000 Encounter for general adult medical examination without abnormal findings: Secondary | ICD-10-CM | POA: Diagnosis not present

## 2017-11-14 DIAGNOSIS — Z1389 Encounter for screening for other disorder: Secondary | ICD-10-CM | POA: Diagnosis not present

## 2017-11-14 DIAGNOSIS — D126 Benign neoplasm of colon, unspecified: Secondary | ICD-10-CM | POA: Diagnosis not present

## 2017-11-14 DIAGNOSIS — N6092 Unspecified benign mammary dysplasia of left breast: Secondary | ICD-10-CM | POA: Diagnosis not present

## 2017-11-14 DIAGNOSIS — Z23 Encounter for immunization: Secondary | ICD-10-CM | POA: Diagnosis not present

## 2017-11-14 DIAGNOSIS — Z8481 Family history of carrier of genetic disease: Secondary | ICD-10-CM | POA: Diagnosis not present

## 2017-11-14 DIAGNOSIS — E7800 Pure hypercholesterolemia, unspecified: Secondary | ICD-10-CM | POA: Diagnosis not present

## 2017-11-14 DIAGNOSIS — Z0000 Encounter for general adult medical examination without abnormal findings: Secondary | ICD-10-CM | POA: Diagnosis not present

## 2018-01-01 ENCOUNTER — Telehealth: Payer: Self-pay | Admitting: Gastroenterology

## 2018-01-01 NOTE — Telephone Encounter (Signed)
Patient scheduled ov with Dr.Jacobs for 11.15.19 but wants to know what to do till then about diarrhea.

## 2018-01-01 NOTE — Telephone Encounter (Signed)
The pt will call her PCP and have eval until her appt with our office.

## 2018-01-01 NOTE — Telephone Encounter (Signed)
Pt has been sick since a week ago with vomiting and diarrhea, wants to know if something can be prescribed.

## 2018-01-01 NOTE — Telephone Encounter (Signed)
The pt was advised that we are unable to prescribe anything over the phone, she was told that she can make an appt or call her PCP for evaluation.  She states she will call PCP and if needed she will call back for an appt with Dr Ardis Hughs.

## 2018-02-02 ENCOUNTER — Encounter: Payer: Self-pay | Admitting: Gastroenterology

## 2018-02-02 ENCOUNTER — Ambulatory Visit: Payer: BLUE CROSS/BLUE SHIELD | Admitting: Gastroenterology

## 2018-02-02 VITALS — BP 140/80 | HR 88 | Ht 63.0 in | Wt 266.4 lb

## 2018-02-02 DIAGNOSIS — K219 Gastro-esophageal reflux disease without esophagitis: Secondary | ICD-10-CM | POA: Diagnosis not present

## 2018-02-02 DIAGNOSIS — R197 Diarrhea, unspecified: Secondary | ICD-10-CM

## 2018-02-02 MED ORDER — ESOMEPRAZOLE MAGNESIUM 40 MG PO CPDR: 40.0000 mg | DELAYED_RELEASE_CAPSULE | Freq: Two times a day (BID) | ORAL | 5 refills | Status: DC

## 2018-02-02 NOTE — Progress Notes (Signed)
Review of pertinent gastrointestinal problems: 1.  Lynch syndrome (PMS2 mutation);   colonoscopy 2018 Dr. Ardis Hughs found several tubular adenomas and sessile serrated adenomas.  Colonoscopy May 2019 Dr. Ardis Hughs found 4 subcentimeter polyps, 3 of them were adenomas.  Recommended 1 year recall for underlying Lynch syndrome  EGD: Mild reflux esophagitis, medium-sized hiatal hernia.  Recommended recall at 3 to 4 years    HPI: This is a very pleasant 47 year old woman whom I last saw at the time of a colonoscopy several months ago.   She had bilateral mastectomy 03/2016 :preventative for PMS2 mutation and very strong FH colon cancer.  Burning in stomach, constantly nauseas, for years.    Omeprazole 20 mg before BF.  Zantac 20m bedtime.  For several months but this does not help.  She takes alleve periodically.  Does take sudafed for sinus issues  She has loose stools for several months.  She can go up to 7 or 8 times a day.  It is always loose.  Never nocturnal.  She cannot point to any certain medicines or antibiotics it started shortly before this.   Previously solid brown stool once daily sometimes twice daily.  Overall weight is increasing.  She takes tums constantly (12, with magnesium).  Chews gum. Eats jolly ranchers, several a day.  Drinks a lot of coffee daily.    Chief complaint is loose stools, epigastric burning  ROS: complete GI ROS as described in HPI, all other review negative.  Constitutional:  No unintentional weight loss   Past Medical History:  Diagnosis Date  . Allergy   . Anxiety   . Asthma   . Cancer (HCC)    Left Breast  . Depression   . Family history of breast cancer   . Family history of melanoma   . Family history of ovarian cancer   . Gallstones   . GERD (gastroesophageal reflux disease)   . IBS (irritable bowel syndrome)   . Lynch syndrome   . Lynch syndrome   . Migraine   . Obesity   . Ovarian cyst     Past Surgical History:  Procedure  Laterality Date  . BREAST RECONSTRUCTION WITH PLACEMENT OF TISSUE EXPANDER AND FLEX HD (ACELLULAR HYDRATED DERMIS) Bilateral 03/31/2016   Procedure: BILATERAL BREAST RECONSTRUCTION WITH PLACEMENT OF TISSUE EXPANDER AND FLEX HD (ACELLULAR HYDRATED DERMIS);  Surgeon: CWallace Going DO;  [facility]  Service: Plastics;  Laterality: Bilateral;  . CESAREAN SECTION  2005  . COLONOSCOPY    . LAPAROSCOPIC VAGINAL HYSTERECTOMY WITH SALPINGO OOPHORECTOMY Bilateral 09/15/2016   Procedure: LAPAROSCOPIC ASSISTED VAGINAL HYSTERECTOMY WITH BILATERAL SALPINGO OOPHORECTOMY;  Surgeon: HMolli Posey MD;  [facility];  Service: Gynecology;  Laterality: Bilateral;  . LIPOSUCTION Bilateral 06/30/2016   Procedure: LIPOSUCTION TO LATERAL CHEST;  Surgeon: CWallace Going DO;  [facility]  Service: Plastics;  Laterality: Bilateral;  . NIPPLE SPARING MASTECTOMY Bilateral 03/31/2016   Procedure: BILATERAL NIPPLE SPARING MASTECTOMIES;  Surgeon: PAutumn MessingIII, MD;  [facility]  Service: General;  Laterality: Bilateral;  . POLYPECTOMY    . REMOVAL OF BILATERAL TISSUE EXPANDERS WITH PLACEMENT OF BILATERAL BREAST IMPLANTS Bilateral 06/30/2016   Procedure: REMOVAL OF BILATERAL TISSUE EXPANDERS WITH PLACEMENT OF BILATERAL SILCONE BREAST IMPLANTS;  Surgeon: CWallace Going DO;  [facility]  Service: Plastics;  Laterality: Bilateral;  . WISDOM TOOTH EXTRACTION      Current Outpatient Medications  Medication Sig Dispense Refill  .  albuterol (PROVENTIL HFA;VENTOLIN HFA) 108 (90 Base) MCG/ACT inhaler Inhale into the lungs every 6 (six) hours as needed for wheezing or shortness of breath.    Marland Kitchen omeprazole (PRILOSEC) 20 MG capsule Take 20 mg by mouth daily.    . ranitidine (ZANTAC 75) 75 MG tablet Take 1 tablet (75 mg total) by mouth at bedtime. 30 tablet 1   Current Facility-Administered Medications  Medication Dose  Route Frequency Provider Last Rate Last Dose  . 0.9 %  sodium chloride infusion  500 mL Intravenous Once Milus Banister, MD        Allergies as of 02/02/2018 - Review Complete 02/02/2018  Allergen Reaction Noted  . Bee venom Anaphylaxis 09/30/2015  . Wellbutrin [bupropion] Anaphylaxis 06/12/2013    Family History  Problem Relation Age of Onset  . Breast cancer Mother 38  . Liver cancer Mother   . Lung cancer Father 35  . Breast cancer Sister 47  . Breast cancer Maternal Grandmother   . Ovarian cancer Maternal Grandmother   . Colon cancer Maternal Grandmother   . Cancer Paternal Grandfather        NOS  . Melanoma Maternal Aunt        mother's maternal half sister  . Colon polyps Neg Hx   . Rectal cancer Neg Hx   . Stomach cancer Neg Hx     Social History   Socioeconomic History  . Marital status: Married    Spouse name: Not on file  . Number of children: 3  . Years of education: Not on file  . Highest education level: Not on file  Occupational History  . Not on file  Social Needs  . Financial resource strain: Not on file  . Food insecurity:    Worry: Not on file    Inability: Not on file  . Transportation needs:    Medical: Not on file    Non-medical: Not on file  Tobacco Use  . Smoking status: Former Smoker    Packs/day: 0.50    Years: 20.00    Pack years: 10.00    Types: Cigarettes    Last attempt to quit: 12/20/2015    Years since quitting: 2.1  . Smokeless tobacco: Never Used  . Tobacco comment: form given [id number]  Substance and Sexual Activity  . Alcohol use: Yes    Comment: occ  . Drug use: No  . Sexual activity: Yes    Birth control/protection: IUD    Comment: IUD REMOVED 09/07/16  Lifestyle  . Physical activity:    Days per week: Not on file    Minutes per session: Not on file  . Stress: Not on file  Relationships  . Social connections:    Talks on phone: Not on file    Gets together: Not on file    Attends religious service: Not on file     Active member of club or organization: Not on file    Attends meetings of clubs or organizations: Not on file    Relationship status: Not on file  . Intimate partner violence:    Fear of current or ex partner: Not on file    Emotionally abused: Not on file    Physically abused: Not on file    Forced sexual activity: Not on file  Other Topics Concern  . Not on file  Social History Narrative  . Not on file     Physical Exam: BP 140/80 (BP Location: Right Arm, Patient Position: Sitting, Cuff Size:  Normal)   Pulse 88   Ht _0  (1.6 m) Comment: height measured without shoes  Wt 266 lb 6 oz (120.8 kg)   LMP 09/07/2016 Comment: IUD REMOVED 09/07/16  BMI 47.19 kg/m  Constitutional: generally well-appearing Psychiatric: alert and oriented x3 Abdomen: soft, nontender, nondistended, no obvious ascites, no peritoneal signs, normal bowel sounds No peripheral edema noted in lower extremities  Assessment and plan: 47 y.o. female with chronic loose stools, epigastric burning  First I think a lot of her epigastric burning is related to acid, GERD.  Her current antiacid regimen is clearly not working.  Morbid obesity probably contributes as well.  I recommended she change to Pepcid 20 mg strength and wrote her prescription for Nexium 40 mg to be taken twice daily.  This is much better antiacid control that she is currently on.  I suspect that she will be able to stop taking Tums.  She takes 12 of them a day and they contain magnesium.  I am very suspicious that the magnesium in her Tums is at least contributing to some of her chronic loose stools.  She also drinks a lot of caffeine daily and recommend she try cutting back on that.  She will call in 3 to 4 weeks to report on her response to this change.  If she is still having bothersome loose stools then she will need some testing for that.  I would probably start with a GI pathogen panel possibly some blood work including sed rate, thyroid  testing, celiac sprue testing.  I would also likely recommend a trial of Mirena on a scheduled basis.  Please see the "Patient Instructions" section for addition details about the plan.  Owens Loffler, MD [facility] 02/02/2018, 10:05 AM

## 2018-02-02 NOTE — Patient Instructions (Addendum)
Start pepcid (famotidine) 20mg  pills, one pill at bedtime nightly. Start nexium 40mg  pills, one pill twice daily before meals, disp 60 with 5 refills.  Call Dr. Ardis Hughs' office in 3-4 weeks to report on your diarrhea and stomach burning.  Thank you for entrusting me with your care and choosing [facility].  Dr Ardis Hughs

## 2018-04-06 ENCOUNTER — Encounter: Payer: Self-pay | Admitting: Plastic Surgery

## 2018-04-06 ENCOUNTER — Ambulatory Visit: Payer: BLUE CROSS/BLUE SHIELD | Admitting: Plastic Surgery

## 2018-04-06 DIAGNOSIS — Z9889 Other specified postprocedural states: Secondary | ICD-10-CM | POA: Diagnosis not present

## 2018-04-06 DIAGNOSIS — Z9013 Acquired absence of bilateral breasts and nipples: Secondary | ICD-10-CM

## 2018-04-06 NOTE — Progress Notes (Signed)
Patient ID: Lynn Simpson, female    DOB: Jun 28, 1970, 48 y.o.   MRN: [id number]   Chief Complaint  Patient presents with  . Follow-up    2 year on implants-pt states things are going good.    The patient is a 48 year old white female here for her yearly follow-up on her bilateral breast reconstructions she underwent nipple sparing mastectomies January 2018 she was reconstructed with [place]or ultrahigh profile 700 cc implants that were placed in April 2018.  She has not had a mammogram since.  She is very pleased with her results.  The left nipple is still inverted but does not bother her.  Occasionally she has some twitching of the right muscle but nothing that lasts for more than a minute or so.  She has gained approximately 100 pounds over the last year.  She has joined a gym with her husband and is going to start working on weight reduction. The implants are soft, there is no capsular contracture.  She looks great and has very good symmetry.   Review of Systems  Constitutional: Negative.  Negative for activity change and appetite change.  HENT: Negative.   Eyes: Negative.   Respiratory: Negative.  Negative for chest tightness.   Cardiovascular: Negative.  Negative for leg swelling.  Gastrointestinal: Negative.   Endocrine: Negative.   Genitourinary: Negative.   Musculoskeletal: Negative.  Negative for gait problem.  Skin: Negative for color change and wound.  Psychiatric/Behavioral: Negative.     Past Medical History:  Diagnosis Date  . Allergy   . Anxiety   . Asthma   . Cancer (HCC)    Left Breast  . Depression   . Family history of breast cancer   . Family history of melanoma   . Family history of ovarian cancer   . Gallstones   . GERD (gastroesophageal reflux disease)   . IBS (irritable bowel syndrome)   . Lynch syndrome   . Lynch syndrome   . Migraine   . Obesity   . Ovarian cyst     Past Surgical History:  Procedure Laterality Date  . BREAST RECONSTRUCTION  WITH PLACEMENT OF TISSUE EXPANDER AND FLEX HD (ACELLULAR HYDRATED DERMIS) Bilateral 03/31/2016   Procedure: BILATERAL BREAST RECONSTRUCTION WITH PLACEMENT OF TISSUE EXPANDER AND FLEX HD (ACELLULAR HYDRATED DERMIS);  Surgeon: Wallace Going, DO;  Location: [facility];  Service: Plastics;  Laterality: Bilateral;  . CESAREAN SECTION  2005  . COLONOSCOPY    . LAPAROSCOPIC VAGINAL HYSTERECTOMY WITH SALPINGO OOPHORECTOMY Bilateral 09/15/2016   Procedure: LAPAROSCOPIC ASSISTED VAGINAL HYSTERECTOMY WITH BILATERAL SALPINGO OOPHORECTOMY;  Surgeon: Molli Posey, MD;  Location: [facility];  Service: Gynecology;  Laterality: Bilateral;  . LIPOSUCTION Bilateral 06/30/2016   Procedure: LIPOSUCTION TO LATERAL CHEST;  Surgeon: Wallace Going, DO;  Location: [facility];  Service: Plastics;  Laterality: Bilateral;  . NIPPLE SPARING MASTECTOMY Bilateral 03/31/2016   Procedure: BILATERAL NIPPLE SPARING MASTECTOMIES;  Surgeon: Autumn Messing III, MD;  Location: [facility];  Service: General;  Laterality: Bilateral;  . POLYPECTOMY    . REMOVAL OF BILATERAL TISSUE EXPANDERS WITH PLACEMENT OF BILATERAL BREAST IMPLANTS Bilateral 06/30/2016   Procedure: REMOVAL OF BILATERAL TISSUE EXPANDERS WITH PLACEMENT OF BILATERAL SILCONE BREAST IMPLANTS;  Surgeon: Wallace Going, DO;  Location: [facility];  Service: Plastics;  Laterality: Bilateral;  . WISDOM TOOTH EXTRACTION        Current Outpatient Medications:  .  esomeprazole (NEXIUM) 40 MG  capsule, Take 1 capsule (40 mg total) by mouth 2 (two) times daily., Disp: 60 capsule, Rfl: 5 .  sertraline (ZOLOFT) 50 MG tablet, Take 1 tablet by mouth daily., Disp: , Rfl:   Current Facility-Administered Medications:  .  0.9 %  sodium chloride infusion, 500 mL, Intravenous, Once, Milus Banister, MD   Objective:   Vitals:   04/06/18 1103  BP: 133/87  Pulse: 86  Temp: 98.1 F (36.7 C)  SpO2: 93%    Physical  Exam Vitals signs and nursing note reviewed.  Constitutional:      Appearance: Normal appearance.  HENT:     Head: Normocephalic and atraumatic.  Neck:     Musculoskeletal: Normal range of motion.  Cardiovascular:     Rate and Rhythm: Normal rate.  Abdominal:     General: Abdomen is flat.  Skin:    General: Skin is warm.  Neurological:     General: No focal deficit present.     Mental Status: She is alert.  Psychiatric:        Mood and Affect: Mood normal.        Thought Content: Thought content normal.        Judgment: Judgment normal.     Assessment & Plan:  S/P mastectomy, bilateral  S/P breast reconstruction, bilateral  I have made a referral to the [facility] for working on her weight.  We discussed the protocol for MRIs to evaluate her implants every 2 to 3 years.  She would like to do that next year.  I will also send a note to Dr. Sonny Dandy asking how he feels about mammograms.  I would like to see her back in 1 year.  [place], DO

## 2018-04-26 DIAGNOSIS — Z01411 Encounter for gynecological examination (general) (routine) with abnormal findings: Secondary | ICD-10-CM | POA: Diagnosis not present

## 2018-04-26 DIAGNOSIS — K219 Gastro-esophageal reflux disease without esophagitis: Secondary | ICD-10-CM | POA: Insufficient documentation

## 2018-04-26 DIAGNOSIS — Z6841 Body Mass Index (BMI) 40.0 and over, adult: Secondary | ICD-10-CM | POA: Diagnosis not present

## 2018-04-26 DIAGNOSIS — Z01419 Encounter for gynecological examination (general) (routine) without abnormal findings: Secondary | ICD-10-CM | POA: Diagnosis not present

## 2018-04-26 DIAGNOSIS — I10 Essential (primary) hypertension: Secondary | ICD-10-CM | POA: Diagnosis not present

## 2018-04-27 DIAGNOSIS — N6092 Unspecified benign mammary dysplasia of left breast: Secondary | ICD-10-CM | POA: Diagnosis not present

## 2018-04-27 DIAGNOSIS — R030 Elevated blood-pressure reading, without diagnosis of hypertension: Secondary | ICD-10-CM | POA: Diagnosis not present

## 2018-04-27 DIAGNOSIS — J449 Chronic obstructive pulmonary disease, unspecified: Secondary | ICD-10-CM | POA: Diagnosis not present

## 2018-04-27 DIAGNOSIS — L720 Epidermal cyst: Secondary | ICD-10-CM | POA: Diagnosis not present

## 2018-05-11 DIAGNOSIS — Z13220 Encounter for screening for lipoid disorders: Secondary | ICD-10-CM | POA: Diagnosis not present

## 2018-05-11 DIAGNOSIS — Z136 Encounter for screening for cardiovascular disorders: Secondary | ICD-10-CM | POA: Diagnosis not present

## 2018-05-11 DIAGNOSIS — Z713 Dietary counseling and surveillance: Secondary | ICD-10-CM | POA: Diagnosis not present

## 2018-06-07 ENCOUNTER — Other Ambulatory Visit: Payer: Self-pay

## 2018-06-07 ENCOUNTER — Encounter (INDEPENDENT_AMBULATORY_CARE_PROVIDER_SITE_OTHER): Payer: Self-pay

## 2018-06-19 ENCOUNTER — Ambulatory Visit (INDEPENDENT_AMBULATORY_CARE_PROVIDER_SITE_OTHER): Payer: BLUE CROSS/BLUE SHIELD | Admitting: Family Medicine

## 2018-07-03 ENCOUNTER — Ambulatory Visit (INDEPENDENT_AMBULATORY_CARE_PROVIDER_SITE_OTHER): Payer: BLUE CROSS/BLUE SHIELD | Admitting: Family Medicine

## 2018-07-24 DIAGNOSIS — Z20818 Contact with and (suspected) exposure to other bacterial communicable diseases: Secondary | ICD-10-CM | POA: Diagnosis not present

## 2018-07-24 DIAGNOSIS — J309 Allergic rhinitis, unspecified: Secondary | ICD-10-CM | POA: Diagnosis not present

## 2018-07-24 DIAGNOSIS — J029 Acute pharyngitis, unspecified: Secondary | ICD-10-CM | POA: Diagnosis not present

## 2018-07-24 DIAGNOSIS — J069 Acute upper respiratory infection, unspecified: Secondary | ICD-10-CM | POA: Diagnosis not present

## 2018-07-24 DIAGNOSIS — R05 Cough: Secondary | ICD-10-CM | POA: Diagnosis not present

## 2018-08-03 ENCOUNTER — Encounter: Payer: Self-pay | Admitting: Gastroenterology

## 2018-08-09 ENCOUNTER — Other Ambulatory Visit: Payer: Self-pay

## 2018-08-14 ENCOUNTER — Ambulatory Visit (AMBULATORY_SURGERY_CENTER): Payer: BLUE CROSS/BLUE SHIELD | Admitting: *Deleted

## 2018-08-14 ENCOUNTER — Other Ambulatory Visit: Payer: Self-pay

## 2018-08-14 VITALS — Ht 63.0 in | Wt 270.0 lb

## 2018-08-14 DIAGNOSIS — Z86010 Personal history of colonic polyps: Secondary | ICD-10-CM

## 2018-08-14 DIAGNOSIS — Z1509 Genetic susceptibility to other malignant neoplasm: Secondary | ICD-10-CM

## 2018-08-14 MED ORDER — NA SULFATE-K SULFATE-MG SULF 17.5-3.13-1.6 GM/177ML PO SOLN: ORAL | 0 refills | Status: DC

## 2018-08-14 NOTE — Progress Notes (Signed)
Patient's pre-visit was done today over the phone with the patient due to Covid-19. Name,DOB and address verified. Insurance verified. Packet of Prep instructions mailed to patient including copy of a consent form and pre-procedure patient acknowledgement form and Suprep coupon-pt is aware. Patient request Suprep, she states she had a hard time drinking the Golytely. suprep sent to pt's pharmacy. Patient understands to call us back with any questions or concerns.   Patient denies any allergies to eggs or soy. Patient denies any problems with anesthesia/sedation. Patient denies any oxygen use at home. Patient denies taking any diet/weight loss medications or blood thinners. EMMI education assisgned to patient on colonoscopy, this was explained and instructions given to patient.

## 2018-08-15 ENCOUNTER — Encounter: Payer: Self-pay | Admitting: Gastroenterology

## 2018-08-21 ENCOUNTER — Encounter (INDEPENDENT_AMBULATORY_CARE_PROVIDER_SITE_OTHER): Payer: Self-pay | Admitting: Family Medicine

## 2018-08-21 ENCOUNTER — Ambulatory Visit (INDEPENDENT_AMBULATORY_CARE_PROVIDER_SITE_OTHER): Payer: BLUE CROSS/BLUE SHIELD | Admitting: Family Medicine

## 2018-08-21 ENCOUNTER — Other Ambulatory Visit: Payer: Self-pay

## 2018-08-21 VITALS — BP 118/76 | HR 79 | Temp 97.8°F | Ht 63.0 in | Wt 272.0 lb

## 2018-08-21 DIAGNOSIS — Z0289 Encounter for other administrative examinations: Secondary | ICD-10-CM

## 2018-08-21 DIAGNOSIS — F3289 Other specified depressive episodes: Secondary | ICD-10-CM

## 2018-08-21 DIAGNOSIS — Z9189 Other specified personal risk factors, not elsewhere classified: Secondary | ICD-10-CM

## 2018-08-21 DIAGNOSIS — R5383 Other fatigue: Secondary | ICD-10-CM | POA: Insufficient documentation

## 2018-08-21 DIAGNOSIS — F32A Depression, unspecified: Secondary | ICD-10-CM | POA: Insufficient documentation

## 2018-08-21 DIAGNOSIS — R0602 Shortness of breath: Secondary | ICD-10-CM | POA: Diagnosis not present

## 2018-08-21 DIAGNOSIS — Z6841 Body Mass Index (BMI) 40.0 and over, adult: Secondary | ICD-10-CM

## 2018-08-21 DIAGNOSIS — Z1509 Genetic susceptibility to other malignant neoplasm: Secondary | ICD-10-CM

## 2018-08-21 DIAGNOSIS — F329 Major depressive disorder, single episode, unspecified: Secondary | ICD-10-CM | POA: Insufficient documentation

## 2018-08-21 NOTE — Progress Notes (Signed)
Office: [phone]  /  Fax: [phone]   Dear Dr. Loel Lofty, [place],    Thank you for referring Lynn Simpson to our clinic. The following note includes my evaluation and treatment recommendations.  HPI:   Chief Complaint: Glen Lyn has been referred by  Loel Lofty. [place], DO for consultation regarding her obesity and obesity related comorbidities.    SHANTESE RAVEN (MR# [id number]) is a 48 y.o. female who presents on 08/21/2018 for obesity evaluation and treatment. Current BMI is Body mass index is 48.18 kg/m.  Carlyle has been struggling with her weight for many years and has been unsuccessful in either losing weight, maintaining weight loss, or reaching her healthy weight goal.     Jovan attended our information session and states she is currently in the action stage of change and ready to dedicate time achieving and maintaining a healthier weight. Hailea is interested in becoming our patient and working on intensive lifestyle modifications including (but not limited to) diet, exercise and weight loss.    Brooklin states her family eats meals together she thinks her family will eat healthier with her her desired weight loss is 142 lbs she started gaining weight in 2005 after pregnancy her heaviest weight ever was 277 lbs she has significant food cravings issues  she snacks frequently in the evenings she wakes up frequently in the middle of the night to eat she skips breakfast 3 days a week she is frequently drinking liquids with calories she frequently makes poor food choices she frequently eats larger portions than normal  she has binge eating behaviors   Fatigue Geoffrey feels her energy is lower than it should be. This has worsened with weight gain and has not worsened recently. Patient is at risk for obstructive sleep apnea. Patient has a history of symptoms of morning headache. Patient generally gets 4 or 5 hours of sleep per night, and states they generally have  restless sleep. Snoring is present. Apneic episodes are present. Epworth Sleepiness Score is 9.  Dyspnea on exertion Daryl notes increasing shortness of breath with exercising and seems to be worsening over time with weight gain. She notes getting out of breath sooner with activity than she used to. This has not gotten worse recently.   Lynch Syndrome Emnet is status post bilateral mastectomy, total hysterectomy, and has a history of colon polyps. She is at higher risk of various cancers due to her weight as well.  Depression with emotional eating behaviors Darien's PHQ-9 is 19. She recognizes that she eats emotionally and has low self esteem. Her weight goal is unrealistic, which we discussed. Tiera is on Zoloft. She is struggling with emotional eating and using food for comfort to the extent that it is negatively impacting her health. She often snacks when she is not hungry. Corvette sometimes feels she is out of control and then feels guilty that she made poor food choices. She shows no sign of suicidal or homicidal ideations.  At risk for cardiovascular disease Kanaya is at a higher than average risk for cardiovascular disease due to Lynch syndrome, depression, and obesity.  Depression Screen Angie's Food and Mood (modified PHQ-9) score was 19. Depression screen PHQ 2/9 08/21/2018  Decreased Interest 3  Down, Depressed, Hopeless 2  PHQ - 2 Score 5  Altered sleeping 2  Tired, decreased energy 3  Change in appetite 3  Feeling bad or failure about yourself  1  Trouble concentrating 2  Moving slowly  or fidgety/restless 3  Suicidal thoughts 0  PHQ-9 Score 19  Difficult doing work/chores Very difficult   ASSESSMENT AND PLAN:  Other fatigue - Plan: EKG 12-Lead, Vitamin B12, CBC With Differential, Comprehensive metabolic panel, Folate, Hemoglobin A1c, Insulin, random, T3, T4, free, TSH, VITAMIN D 25 Hydroxy (Vit-D Deficiency, Fractures)  Shortness of breath on exertion - Plan: Lipid Panel With  LDL/HDL Ratio  Lynch syndrome  Other depression - with emotional eating  At risk for heart disease  Class 3 severe obesity with serious comorbidity and body mass index (BMI) of 45.0 to 49.9 in adult, unspecified obesity type (HCC)  PLAN:  Fatigue Aizley was informed that her fatigue may be related to obesity, depression or many other causes. Labs will be ordered, and in the meanwhile, Paula has agreed to work on diet, exercise and weight loss to help with fatigue. Proper sleep hygiene was discussed including the need for 7-8 hours of quality sleep each night. A sleep study was not ordered based on symptoms and Epworth score. An EKG and an indirect calorimetry was ordered today. Kimberlyn agrees to follow up in 2 weeks and we will recheck an EKG at that time.  Dyspnea on exertion Danikah's shortness of breath appears to be obesity related and exercise induced. She has agreed to work on weight loss and gradually increase exercise to treat her exercise induced shortness of breath. If Hermie follows our instructions and loses weight without improvement of her shortness of breath, we will plan to refer to pulmonology. An EKG, labs, and an indirect calorimetry was ordered today. We will monitor this condition regularly and she will follow up as directed. Cyenna agrees to this plan.  Lynch Syndrome Prapti agrees to start her diet prescription and exercise. We will continue to monitor her closely and she agrees to follow up in 2 weeks.  Depression with Emotional Eating Behaviors We discussed behavior modification techniques today to help Sorcha deal with her emotional eating and depression. Patient was referred to Dr. Mallie Mussel, our bariatric psychologist for evaluation due to elevated PHQ-9 score and significant struggles with emotional eating. She has agreed to continue to her medication and agreed to follow up as directed.  Cardiovascular risk counseling Kaliya was given extended (30 minutes) coronary artery disease  prevention counseling today. She is 48 y.o. female and has risk factors for heart disease including Lynch syndrome, depression, and obesity. We discussed intensive lifestyle modifications today with an emphasis on specific weight loss instructions and strategies. Pt was also informed of the importance of increasing exercise and decreasing saturated fats to help prevent heart disease.  Depression Screen Rafeef had a strongly positive depression screening. Depression is commonly associated with obesity and often results in emotional eating behaviors. We will monitor this closely and work on CBT to help improve the non-hunger eating patterns. Referral to Psychology may be required if no improvement is seen as she continues in our clinic.  Obesity Jamisyn is currently in the action stage of change and her goal is to continue with weight loss efforts. I recommend Kristianna begin the structured treatment plan as follows:  She has agreed to follow the Category 3 plan.  Nimrat has been instructed to eventually work up to a goal of 150 minutes of combined cardio and strengthening exercise per week for weight loss and overall health benefits. We discussed the following Behavioral Modification Strategies today: increasing lean protein intake, decrease eating out, no skipping meals, and work on meal planning and easy cooking plans.  She was informed of the importance of frequent follow up visits to maximize her success with intensive lifestyle modifications for her multiple health conditions. She was informed we would discuss her lab results at her next visit unless there is a critical issue that needs to be addressed sooner. Cartina agreed to keep her next visit at the agreed upon time to discuss these results.  ALLERGIES: Allergies  Allergen Reactions  . Bee Venom Anaphylaxis  . Wellbutrin [Bupropion] Anaphylaxis    MEDICATIONS: Current Outpatient Medications on File Prior to Visit  Medication Sig Dispense Refill   . clonazePAM (KLONOPIN) 0.5 MG tablet daily as needed.    Marland Kitchen esomeprazole (NEXIUM) 40 MG capsule Take 1 capsule (40 mg total) by mouth 2 (two) times daily. 60 capsule 5  . sertraline (ZOLOFT) 50 MG tablet Take 1 tablet by mouth daily.     No current facility-administered medications on file prior to visit.     PAST MEDICAL HISTORY: Past Medical History:  Diagnosis Date  . Allergy   . Anxiety   . Asthma   . Back pain   . Cancer ([place]) 2018   Left Breast  . Depression   . Dyspnea   . Family history of breast cancer   . Family history of melanoma   . Family history of ovarian cancer   . Gallstones   . GERD (gastroesophageal reflux disease)   . IBS (irritable bowel syndrome)   . Lower extremity edema   . Lynch syndrome   . Lynch syndrome   . Migraine   . Obesity   . Ovarian cyst   . Vitamin D deficiency     PAST SURGICAL HISTORY: Past Surgical History:  Procedure Laterality Date  . BREAST RECONSTRUCTION WITH PLACEMENT OF TISSUE EXPANDER AND FLEX HD (ACELLULAR HYDRATED DERMIS) Bilateral 03/31/2016   Procedure: BILATERAL BREAST RECONSTRUCTION WITH PLACEMENT OF TISSUE EXPANDER AND FLEX HD (ACELLULAR HYDRATED DERMIS);  Surgeon: Wallace Going, DO;  Location: [facility];  Service: Plastics;  Laterality: Bilateral;  . CESAREAN SECTION  2005  . COLONOSCOPY    . LAPAROSCOPIC VAGINAL HYSTERECTOMY WITH SALPINGO OOPHORECTOMY Bilateral 09/15/2016   Procedure: LAPAROSCOPIC ASSISTED VAGINAL HYSTERECTOMY WITH BILATERAL SALPINGO OOPHORECTOMY;  Surgeon: Molli Posey, MD;  Location: [facility];  Service: Gynecology;  Laterality: Bilateral;  . LIPOSUCTION Bilateral 06/30/2016   Procedure: LIPOSUCTION TO LATERAL CHEST;  Surgeon: Wallace Going, DO;  Location: [facility];  Service: Plastics;  Laterality: Bilateral;  . NIPPLE SPARING MASTECTOMY Bilateral 03/31/2016   Procedure: BILATERAL NIPPLE SPARING MASTECTOMIES;  Surgeon: Autumn Messing III, MD;  Location: [facility];  Service: General;  Laterality: Bilateral;  . POLYPECTOMY    . REMOVAL OF BILATERAL TISSUE EXPANDERS WITH PLACEMENT OF BILATERAL BREAST IMPLANTS Bilateral 06/30/2016   Procedure: REMOVAL OF BILATERAL TISSUE EXPANDERS WITH PLACEMENT OF BILATERAL SILCONE BREAST IMPLANTS;  Surgeon: Wallace Going, DO;  Location: [facility];  Service: Plastics;  Laterality: Bilateral;  . WISDOM TOOTH EXTRACTION      SOCIAL HISTORY: Social History   Tobacco Use  . Smoking status: Former Smoker    Packs/day: 0.50    Years: 20.00    Pack years: 10.00    Types: Cigarettes    Last attempt to quit: 12/20/2015    Years since quitting: 2.6  . Smokeless tobacco: Never Used  . Tobacco comment: form given [id number]  Substance Use Topics  . Alcohol use: Not Currently    Comment: occ  .  Drug use: No    FAMILY HISTORY: Family History  Problem Relation Age of Onset  . Breast cancer Mother 71  . Liver cancer Mother   . Alcoholism Mother   . Lung cancer Father 57  . Breast cancer Sister 27  . Breast cancer Maternal Grandmother   . Ovarian cancer Maternal Grandmother   . Colon cancer Maternal Grandmother   . Cancer Paternal Grandfather        NOS  . Melanoma Maternal Aunt        mother's maternal half sister  . Colon polyps Neg Hx   . Rectal cancer Neg Hx   . Stomach cancer Neg Hx   . Esophageal cancer Neg Hx     ROS: Review of Systems  Constitutional: Positive for malaise/fatigue.  Eyes:       Positive for vision changes.  Respiratory: Positive for shortness of breath ( with activity).   Cardiovascular:       Positive for calf and leg pain with walking. Positive for leg cramping.  Neurological: Positive for weakness and headaches.  Psychiatric/Behavioral: Positive for depression. Negative for suicidal ideas.       Positive for stress.    PHYSICAL EXAM: Blood pressure 118/76, pulse 79, temperature 97.8 F (36.6 C), temperature source Oral, height 5\' 3"   (1.6 m), weight 272 lb (123.4 kg), last menstrual period 09/07/2016, SpO2 95 %. Body mass index is 48.18 kg/m. Physical Exam Vitals signs reviewed.  Constitutional:      Appearance: Normal appearance. She is obese.  HENT:     Head: Normocephalic and atraumatic.     Nose: Nose normal.  Eyes:     General: No scleral icterus.    Extraocular Movements: Extraocular movements intact.  Neck:     Musculoskeletal: Normal range of motion and neck supple.     Thyroid: No thyromegaly.     Comments: Negative for thyromegaly. Cardiovascular:     Rate and Rhythm: Normal rate and regular rhythm.  Pulmonary:     Effort: Pulmonary effort is normal. No respiratory distress.  Abdominal:     Palpations: Abdomen is soft.     Tenderness: There is no abdominal tenderness.     Comments: Positive for obesity.  Musculoskeletal:     Comments: ROM normal in all extremities.  Skin:    General: Skin is warm and dry.  Neurological:     Mental Status: She is alert and oriented to person, place, and time.     Coordination: Coordination normal.  Psychiatric:        Mood and Affect: Mood normal.        Behavior: Behavior normal.     RECENT LABS AND TESTS: BMET    Component Value Date/Time   NA 138 09/17/2016 0542   K 3.5 09/17/2016 0542   CL 105 09/17/2016 0542   CO2 26 09/17/2016 0542   GLUCOSE 85 09/17/2016 0542   BUN 6 09/17/2016 0542   CREATININE 0.63 09/17/2016 0542   CALCIUM 8.4 (L) 09/17/2016 0542   GFRNONAA >60 09/17/2016 0542   GFRAA >60 09/17/2016 0542   No results found for: HGBA1C No results found for: INSULIN CBC    Component Value Date/Time   WBC 14.3 (H) 09/17/2016 0513   RBC 3.96 09/17/2016 0513   HGB 11.9 (L) 09/17/2016 0513   HCT 35.6 (L) 09/17/2016 0513   PLT 134 (L) 09/17/2016 0513   MCV 89.9 09/17/2016 0513   MCH 30.1 09/17/2016 0513   MCHC 33.4 09/17/2016  0513   RDW 13.7 09/17/2016 0513   LYMPHSABS 1.8 09/17/2016 0513   MONOABS 0.5 09/17/2016 0513   EOSABS 0.2  09/17/2016 0513   BASOSABS 0.0 09/17/2016 0513   Iron/TIBC/Ferritin/ %Sat No results found for: IRON, TIBC, FERRITIN, IRONPCTSAT Lipid Panel  No results found for: CHOL, TRIG, HDL, CHOLHDL, VLDL, LDLCALC, LDLDIRECT Hepatic Function Panel     Component Value Date/Time   PROT 6.0 (L) 09/17/2016 0542   ALBUMIN 3.2 (L) 09/17/2016 0542   AST 23 09/17/2016 0542   ALT 24 09/17/2016 0542   ALKPHOS 88 09/17/2016 0542   BILITOT 0.6 09/17/2016 0542   BILIDIR <0.1 09/12/2009 0345   IBILI NOT CALCULATED 09/12/2009 0345   No results found for: TSH   ECG  shows NSR with a rate of 87 BPM. INDIRECT CALORIMETER done today shows a VO2 of 339 and a REE of 2363.  Her calculated basal metabolic rate is 3810 thus her basal metabolic rate is better than expected.  OBESITY BEHAVIORAL INTERVENTION VISIT  Today's visit was # 1  Starting weight: 272 lbs Starting date: 08/21/18 Today's weight : Weight: 272 lb (123.4 kg)  Today's date: 08/21/2018 Total lbs lost to date: 0    08/21/2018  Height 5\' 3"  (1.6 m)  Weight 272 lb (123.4 kg)  BMI (Calculated) 48.19  BLOOD PRESSURE - SYSTOLIC 175  BLOOD PRESSURE - DIASTOLIC 76  Waist Measurement  49 inches   Body Fat % 53.3 %  Total Body Water (lbs) 91.4 lbs  RMR 2363   ASK: We discussed the diagnosis of obesity with Lynn Simpson today and Asuzena agreed to give [place] permission to discuss obesity behavioral modification therapy today.  ASSESS: Xochilt has the diagnosis of obesity and her BMI today is 48.1. Quaneshia is in the action stage of change.   ADVISE: Emmaclaire was educated on the multiple health risks of obesity as well as the benefit of weight loss to improve her health. She was advised of the need for long term treatment and the importance of lifestyle modifications to improve her current health and to decrease her risk of future health problems.  AGREE: Multiple dietary modification options and treatment options were discussed and Jancy agreed to follow the  recommendations documented in the above note.  ARRANGE: Neshia was educated on the importance of frequent visits to treat obesity as outlined per CMS and USPSTF guidelines and agreed to schedule her next follow up appointment today.  IMarcille Blanco, CMA, am acting as transcriptionist for Starlyn Skeans, MD I have reviewed the above documentation for accuracy and completeness, and I agree with the above. -Dennard Nip, MD

## 2018-08-22 LAB — CBC WITH DIFFERENTIAL
Basophils Absolute: 0.1 10*3/uL (ref 0.0–0.2)
Basos: 1 %
EOS (ABSOLUTE): 0.3 10*3/uL (ref 0.0–0.4)
Eos: 4 %
Hematocrit: 46 % (ref 34.0–46.6)
Hemoglobin: 15.1 g/dL (ref 11.1–15.9)
Immature Grans (Abs): 0 10*3/uL (ref 0.0–0.1)
Immature Granulocytes: 0 %
Lymphocytes Absolute: 2.3 10*3/uL (ref 0.7–3.1)
Lymphs: 29 %
MCH: 30.2 pg (ref 26.6–33.0)
MCHC: 32.8 g/dL (ref 31.5–35.7)
MCV: 92 fL (ref 79–97)
Monocytes Absolute: 0.5 10*3/uL (ref 0.1–0.9)
Monocytes: 6 %
Neutrophils Absolute: 4.7 10*3/uL (ref 1.4–7.0)
Neutrophils: 60 %
RBC: 5 x10E6/uL (ref 3.77–5.28)
RDW: 12.5 % (ref 11.7–15.4)
WBC: 7.9 10*3/uL (ref 3.4–10.8)

## 2018-08-22 LAB — T3: T3, Total: 138 ng/dL (ref 71–180)

## 2018-08-22 LAB — COMPREHENSIVE METABOLIC PANEL
ALT: 24 IU/L (ref 0–32)
AST: 17 IU/L (ref 0–40)
Albumin/Globulin Ratio: 2.1 (ref 1.2–2.2)
Albumin: 4.5 g/dL (ref 3.8–4.8)
Alkaline Phosphatase: 135 IU/L — ABNORMAL HIGH (ref 39–117)
BUN/Creatinine Ratio: 16 (ref 9–23)
BUN: 11 mg/dL (ref 6–24)
Bilirubin Total: 0.2 mg/dL (ref 0.0–1.2)
CO2: 23 mmol/L (ref 20–29)
Calcium: 9.3 mg/dL (ref 8.7–10.2)
Chloride: 103 mmol/L (ref 96–106)
Creatinine, Ser: 0.67 mg/dL (ref 0.57–1.00)
GFR calc Af Amer: 122 mL/min/{1.73_m2} (ref >59)
GFR calc non Af Amer: 106 mL/min/{1.73_m2} (ref >59)
Globulin, Total: 2.1 g/dL (ref 1.5–4.5)
Glucose: 93 mg/dL (ref 65–99)
Potassium: 4.4 mmol/L (ref 3.5–5.2)
Sodium: 140 mmol/L (ref 134–144)
Total Protein: 6.6 g/dL (ref 6.0–8.5)

## 2018-08-22 LAB — VITAMIN B12: Vitamin B-12: 290 pg/mL (ref 232–1245)

## 2018-08-22 LAB — LIPID PANEL WITH LDL/HDL RATIO
Cholesterol, Total: 212 mg/dL — ABNORMAL HIGH (ref 100–199)
HDL: 59 mg/dL (ref >39)
LDL Calculated: 137 mg/dL — ABNORMAL HIGH (ref 0–99)
LDl/HDL Ratio: 2.3 ratio (ref 0.0–3.2)
Triglycerides: 79 mg/dL (ref 0–149)
VLDL Cholesterol Cal: 16 mg/dL (ref 5–40)

## 2018-08-22 LAB — TSH: TSH: 3.22 u[IU]/mL (ref 0.450–4.500)

## 2018-08-22 LAB — T4, FREE: Free T4: 1.06 ng/dL (ref 0.82–1.77)

## 2018-08-22 LAB — INSULIN, RANDOM: INSULIN: 18.6 u[IU]/mL (ref 2.6–24.9)

## 2018-08-22 LAB — HEMOGLOBIN A1C
Est. average glucose Bld gHb Est-mCnc: 108 mg/dL
Hgb A1c MFr Bld: 5.4 % (ref 4.8–5.6)

## 2018-08-22 LAB — VITAMIN D 25 HYDROXY (VIT D DEFICIENCY, FRACTURES): Vit D, 25-Hydroxy: 11.7 ng/mL — ABNORMAL LOW (ref 30.0–100.0)

## 2018-08-22 LAB — FOLATE: Folate: 4.1 ng/mL (ref >3.0)

## 2018-08-22 LAB — SPECIMEN STATUS REPORT

## 2018-08-27 ENCOUNTER — Telehealth: Payer: Self-pay | Admitting: *Deleted

## 2018-08-27 NOTE — Telephone Encounter (Signed)

## 2018-08-28 ENCOUNTER — Ambulatory Visit (AMBULATORY_SURGERY_CENTER): Payer: BC Managed Care – PPO | Admitting: Gastroenterology

## 2018-08-28 ENCOUNTER — Encounter: Payer: Self-pay | Admitting: Gastroenterology

## 2018-08-28 ENCOUNTER — Encounter: Payer: BLUE CROSS/BLUE SHIELD | Admitting: Gastroenterology

## 2018-08-28 ENCOUNTER — Other Ambulatory Visit: Payer: Self-pay

## 2018-08-28 VITALS — BP 126/75 | HR 77 | Temp 98.4°F | Resp 21 | Ht 63.0 in | Wt 270.0 lb

## 2018-08-28 DIAGNOSIS — D123 Benign neoplasm of transverse colon: Secondary | ICD-10-CM | POA: Diagnosis not present

## 2018-08-28 DIAGNOSIS — Z1509 Genetic susceptibility to other malignant neoplasm: Secondary | ICD-10-CM | POA: Diagnosis not present

## 2018-08-28 DIAGNOSIS — Z1211 Encounter for screening for malignant neoplasm of colon: Secondary | ICD-10-CM | POA: Diagnosis not present

## 2018-08-28 DIAGNOSIS — K5730 Diverticulosis of large intestine without perforation or abscess without bleeding: Secondary | ICD-10-CM

## 2018-08-28 DIAGNOSIS — D125 Benign neoplasm of sigmoid colon: Secondary | ICD-10-CM

## 2018-08-28 DIAGNOSIS — Z86010 Personal history of colonic polyps: Secondary | ICD-10-CM | POA: Diagnosis not present

## 2018-08-28 MED ORDER — SODIUM CHLORIDE 0.9 % IV SOLN: 500.0000 mL | Freq: Once | INTRAVENOUS | Status: DC

## 2018-08-28 NOTE — Progress Notes (Signed)
Called to room to assist during endoscopic procedure.  Patient ID and intended procedure confirmed with present staff. Received instructions for my participation in the procedure from the performing physician.  

## 2018-08-28 NOTE — Progress Notes (Signed)
Pt's states no medical or surgical changes since previsit or office visit. 

## 2018-08-28 NOTE — Op Note (Signed)
[facility] Patient Name: Lynn Simpson Procedure Date: 08/28/2018 9:24 AM MRN: [id number] Endoscopist: Milus Banister , MD Age: 48 Referring MD:  Date of Birth: [birth year] Gender: Female Account #: [id number] Procedure:                Colonoscopy Indications:              Lynch syndrome: volonoscopy 2018 Dr. Ardis Hughs found                            several tubular adenomas and sessile serrated                            adenomas. Colonoscopy May 2019 Dr. Ardis Hughs found 4                            subcentimeter polyps, 3 of them were adenomas. Medicines:                Monitored Anesthesia Care Procedure:                Pre-Anesthesia Assessment:                           - Prior to the procedure, a History and Physical                            was performed, and patient medications and                            allergies were reviewed. The patient's tolerance of                            previous anesthesia was also reviewed. The risks                            and benefits of the procedure and the sedation                            options and risks were discussed with the patient.                            All questions were answered, and informed consent                            was obtained. Prior Anticoagulants: The patient has                            taken no previous anticoagulant or antiplatelet                            agents. ASA Grade Assessment: II - A patient with                            mild systemic disease. After reviewing the risks  and benefits, the patient was deemed in                            satisfactory condition to undergo the procedure.                           After obtainng informed consent, the colonoscope                            was passed under direct vision. Throughout the                            procedure, the patient's blood pressure, pulse, and                            oxygen saturations  were monitored continuously. The                            Colonoscope was introduced through the anus and                            advanced to the the cecum, identified by                            appendiceal orifice and ileocecal valve. The                            colonoscopy was performed without difficulty. The                            patient tolerated the procedure well. The quality                            of the bowel preparation was good. The ileocecal                            valve, appendiceal orifice, and rectum were                            photographed. Scope In: 9:32:37 AM Scope Out: 9:44:11 AM Scope Withdrawal Time: 0 hours 8 minutes 10 seconds  Total Procedure Duration: 0 hours 11 minutes 34 seconds  Findings:                 A 1 mm polyp was found in the transverse colon. The                            polyp was sessile. The polyp was removed with a                            cold biopsy forceps. Resection and retrieval were                            complete.  A 3 mm polyp was found in the sigmoid colon. The                            polyp was sessile. The polyp was removed with a                            cold snare. Resection and retrieval were complete.                           Multiple small and large-mouthed diverticula were                            found in the left colon.                           The exam was otherwise without abnormality on                            direct and retroflexion views. Complications:            No immediate complications. Estimated blood loss:                            None. Estimated Blood Loss:     Estimated blood loss: none. Impression:               - One 1 mm polyp in the transverse colon, removed                            with a cold biopsy forceps. Resected and retrieved.                           - One 3 mm polyp in the sigmoid colon, removed with                             a cold snare. Resected and retrieved.                           - Diverticulosis in the left colon.                           - The examination was otherwise normal on direct                            and retroflexion views. Recommendation:           - Patient has a contact number available for                            emergencies. The signs and symptoms of potential                            delayed complications were discussed with the  patient. Return to normal activities tomorrow.                            Written discharge instructions were provided to the                            patient.                           - Resume previous diet.                           - Continue present medications.                           - Repeat colonoscopy is recommended. The                            colonoscopy date will be determined after pathology                            results from today's exam become available for                            review. Likely repeat colonoscopy in 1 year and                            same day EGD given your Lynch Syndrome. Milus Banister, MD 08/28/2018 9:47:37 AM This report has been signed electronically.

## 2018-08-28 NOTE — Progress Notes (Signed)
To PACU, VSS. Report to [email or web address] 

## 2018-08-28 NOTE — Patient Instructions (Signed)
Information on polyps and diverticulosis given to you today.  Await pathology results.  Repeat colonoscopy and EGD in 1 year.  YOU HAD AN ENDOSCOPIC PROCEDURE TODAY AT [facility]:   Refer to the procedure report that was given to you for any specific questions about what was found during the examination.  If the procedure report does not answer your questions, please call your gastroenterologist to clarify.  If you requested that your care partner not be given the details of your procedure findings, then the procedure report has been included in a sealed envelope for you to review at your convenience later.  YOU SHOULD EXPECT: Some feelings of bloating in the abdomen. Passage of more gas than usual.  Walking can help get rid of the air that was put into your GI tract during the procedure and reduce the bloating. If you had a lower endoscopy (such as a colonoscopy or flexible sigmoidoscopy) you may notice spotting of blood in your stool or on the toilet paper. If you underwent a bowel prep for your procedure, you may not have a normal bowel movement for a few days.  Please Note:  You might notice some irritation and congestion in your nose or some drainage.  This is from the oxygen used during your procedure.  There is no need for concern and it should clear up in a day or so.  SYMPTOMS TO REPORT IMMEDIATELY:   Following lower endoscopy (colonoscopy or flexible sigmoidoscopy):  Excessive amounts of blood in the stool  Significant tenderness or worsening of abdominal pains  Swelling of the abdomen that is new, acute  Fever of 100F or higher   For urgent or emergent issues, a gastroenterologist can be reached at any hour by calling [phone].   DIET:  We do recommend a small meal at first, but then you may proceed to your regular diet.  Drink plenty of fluids but you should avoid alcoholic beverages for 24 hours.  ACTIVITY:  You should plan to take it easy for the rest  of today and you should NOT DRIVE or use heavy machinery until tomorrow (because of the sedation medicines used during the test).    FOLLOW UP: Our staff will call the number listed on your records 48-72 hours following your procedure to check on you and address any questions or concerns that you may have regarding the information given to you following your procedure. If we do not reach you, we will leave a message.  We will attempt to reach you two times.  During this call, we will ask if you have developed any symptoms of COVID 19. If you develop any symptoms (ie: fever, flu-like symptoms, shortness of breath, cough etc.) before then, please call [phone].  If you test positive for Covid 19 in the 2 weeks post procedure, please call and report this information to [place].    If any biopsies were taken you will be contacted by phone or by letter within the next 1-3 weeks.  Please call us at [phone] if you have not heard about the biopsies in 3 weeks.    SIGNATURES/CONFIDENTIALITY: You and/or your care partner have signed paperwork which will be entered into your electronic medical record.  These signatures attest to the fact that that the information above on your After Visit Summary has been reviewed and is understood.  Full responsibility of the confidentiality of this discharge information lies with you and/or your care-partner.

## 2018-08-30 ENCOUNTER — Telehealth: Payer: Self-pay | Admitting: *Deleted

## 2018-08-30 NOTE — Telephone Encounter (Signed)
  Follow up Call-  Call back number 08/28/2018 07/25/2017 05/18/2016  Post procedure Call Back phone  # [phone]  Permission to leave phone message Yes Yes Yes  Some recent data might be hidden     Patient questions:  Do you have a fever, pain , or abdominal swelling? No. Pain Score  0 *  Have you tolerated food without any problems? Yes.    Have you been able to return to your normal activities? Yes.    Do you have any questions about your discharge instructions: Diet   No. Medications  No. Follow up visit  No.  Do you have questions or concerns about your Care? No.  Actions: * If pain score is 4 or above: No action needed, pain <4.  1. Have you developed a fever since your procedure? no  2.   Have you had an respiratory symptoms (SOB or cough) since your procedure? no  3.   Have you tested positive for COVID 19 since your procedure no  4.   Have you had any family members/close contacts diagnosed with the COVID 19 since your procedure?  no   If yes to any of these questions please route to Joylene John, RN and Alphonsa Gin, Therapist, sports.
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
## 2018-09-04 ENCOUNTER — Ambulatory Visit (INDEPENDENT_AMBULATORY_CARE_PROVIDER_SITE_OTHER): Payer: BLUE CROSS/BLUE SHIELD | Admitting: Psychology

## 2018-09-04 ENCOUNTER — Ambulatory Visit (INDEPENDENT_AMBULATORY_CARE_PROVIDER_SITE_OTHER): Payer: BLUE CROSS/BLUE SHIELD | Admitting: Family Medicine

## 2018-09-04 ENCOUNTER — Other Ambulatory Visit: Payer: Self-pay

## 2018-09-04 ENCOUNTER — Encounter (INDEPENDENT_AMBULATORY_CARE_PROVIDER_SITE_OTHER): Payer: Self-pay | Admitting: Family Medicine

## 2018-09-04 VITALS — BP 128/81 | HR 92 | Temp 97.9°F | Ht 63.0 in | Wt 274.0 lb

## 2018-09-04 DIAGNOSIS — E8881 Metabolic syndrome: Secondary | ICD-10-CM

## 2018-09-04 DIAGNOSIS — Z9189 Other specified personal risk factors, not elsewhere classified: Secondary | ICD-10-CM | POA: Diagnosis not present

## 2018-09-04 DIAGNOSIS — Z6841 Body Mass Index (BMI) 40.0 and over, adult: Secondary | ICD-10-CM

## 2018-09-04 DIAGNOSIS — F330 Major depressive disorder, recurrent, mild: Secondary | ICD-10-CM

## 2018-09-04 DIAGNOSIS — E7849 Other hyperlipidemia: Secondary | ICD-10-CM | POA: Diagnosis not present

## 2018-09-04 DIAGNOSIS — E559 Vitamin D deficiency, unspecified: Secondary | ICD-10-CM | POA: Diagnosis not present

## 2018-09-04 DIAGNOSIS — E88819 Insulin resistance, unspecified: Secondary | ICD-10-CM

## 2018-09-04 MED ORDER — VITAMIN D (ERGOCALCIFEROL) 1.25 MG (50000 UNIT) PO CAPS: 50000.0000 [IU] | ORAL_CAPSULE | ORAL | 0 refills | Status: DC

## 2018-09-05 ENCOUNTER — Encounter: Payer: Self-pay | Admitting: Gastroenterology

## 2018-09-05 NOTE — Progress Notes (Signed)
Office: [phone]  /  Fax: [phone]   HPI:   Chief Complaint: OBESITY Lynn Simpson is here to discuss her progress with her obesity treatment plan. She is on the Category 3 plan and is following her eating plan approximately 80 % of the time. She states she is exercising 0 minutes 0 times per week. Lynn Simpson struggled to follow her plan, the first ten days, due to some social commitments. She did some celebration eating, but she started her plan strictly three days ago. Lynn Simpson liked the plan, while she did it and she states hunger was controlled. Her weight is 274 lb (124.3 kg) today and has had a weight gain of 2 pounds over a period of 2 weeks since her last visit. She has gained 2 lbs since starting treatment with [place].  Vitamin D deficiency (new) Lynn Simpson has a new diagnosis of vitamin D deficiency. She has a very low vitamin D level. Lynn Simpson is not currently taking vit D. Lynn Simpson admits fatigue and denies nausea, vomiting or muscle weakness.  Insulin Resistance (new) Lynn Simpson has a new diagnosis of insulin resistance based on her elevated fasting insulin level >5. Although Lynn Simpson's blood glucose readings are still under good control, insulin resistance puts her at greater risk of metabolic syndrome and diabetes. Lynn Simpson admits to polyphagia, especially in the evening.  At risk for diabetes Lynn Simpson is at higher than average risk for developing diabetes due to her obesity and insulin resistance. She currently denies polyuria or polydipsia.  Hyperlipidemia pure Lynn Simpson has hyperlipidemia. Her LDL is elevated, but her HDL and triglycerides are within normal limits. She is attempting to improve her cholesterol levels with intensive lifestyle modification including a low saturated fat diet, exercise and weight loss. She denies any chest pain and she is unsure about her family history.  ASSESSMENT AND PLAN:  Vitamin D deficiency - Plan: Vitamin D, Ergocalciferol, (DRISDOL) 1.25 MG (50000 UT) CAPS capsule  Insulin  resistance  At risk for diabetes mellitus  Other hyperlipidemia  Class 3 severe obesity with serious comorbidity and body mass index (BMI) of 45.0 to 49.9 in adult, unspecified obesity type ([place])  PLAN:  Vitamin D Deficiency (new) Lynn Simpson was informed that low vitamin D levels contributes to fatigue and are associated with obesity, breast, and colon cancer. She agrees to start prescription Vit D @50 ,000 IU every week #4 with no refills and will follow up for routine testing of vitamin D, at least 2-3 times per year. She was informed of the risk of over-replacement of vitamin D and agrees to not increase her dose unless she discusses this with [place] first. Lynn Simpson agrees to follow up as directed.  Insulin Resistance (new) Lynn Simpson will continue to work on weight loss, exercise, and decreasing simple carbohydrates in her diet to help decrease the risk of diabetes. She was informed that eating too many simple carbohydrates or too many calories at one sitting increases the likelihood of GI side effects. We will defer metformin. Lynn Simpson will start her diet prescription and we will follow.Lynn Simpson agrees to follow up with [place] as directed to monitor her progress.  Diabetes risk counseling Lynn Simpson was given extended (30 minutes) diabetes prevention counseling today. She is 48 y.o. female and has risk factors for diabetes including obesity and insulin resistance. We discussed intensive lifestyle modifications today with an emphasis on weight loss as well as increasing exercise and decreasing simple carbohydrates in her diet.  Hyperlipidemia pure Lynn Simpson was informed of the American Heart Association Guidelines emphasizing intensive lifestyle  modifications as the first line treatment for hyperlipidemia. We discussed many lifestyle modifications today in depth, and [name] will continue to work on decreasing saturated fats such as fatty red meat, butter and many fried foods. She will also increase vegetables and lean protein in her diet  and continue to work on exercise and weight loss efforts. We will recheck labs in 3 months.  Obesity Lynn Simpson is currently in the action stage of change. As such, her goal is to continue with weight loss efforts She has agreed to follow the Category 3 plan Lynn Simpson has been instructed to work up to a goal of 150 minutes of combined cardio and strengthening exercise per week for weight loss and overall health benefits. We discussed the following Behavioral Modification Strategies today: increasing lean protein intake, decreasing simple carbohydrates, increasing vegetables and work on meal planning and easy cooking plans  Lynn Simpson has agreed to follow up with our clinic in 2 weeks. She was informed of the importance of frequent follow up visits to maximize her success with intensive lifestyle modifications for her multiple health conditions.  ALLERGIES: Allergies  Allergen Reactions  . Bee Venom Anaphylaxis  . Wellbutrin [Bupropion] Anaphylaxis    MEDICATIONS: Current Outpatient Medications on File Prior to Visit  Medication Sig Dispense Refill  . albuterol (VENTOLIN HFA) 108 (90 Base) MCG/ACT inhaler Inhale into the lungs every 6 (six) hours as needed for wheezing or shortness of breath.    . clonazePAM (KLONOPIN) 0.5 MG tablet daily as needed.    Marland Kitchen esomeprazole (NEXIUM) 40 MG capsule Take 1 capsule (40 mg total) by mouth 2 (two) times daily. 60 capsule 5  . sertraline (ZOLOFT) 50 MG tablet Take 1 tablet by mouth daily.     No current facility-administered medications on file prior to visit.     PAST MEDICAL HISTORY: Past Medical History:  Diagnosis Date  . Allergy   . Anxiety   . Asthma   . Back pain   . Cancer ([place]) 2018   Left Breast  . Depression   . Dyspnea   . Family history of breast cancer   . Family history of melanoma   . Family history of ovarian cancer   . Gallstones   . GERD (gastroesophageal reflux disease)   . IBS (irritable bowel syndrome)   . Lower extremity edema    . Lynch syndrome   . Lynch syndrome   . Migraine   . Obesity   . Ovarian cyst   . Vitamin D deficiency     PAST SURGICAL HISTORY: Past Surgical History:  Procedure Laterality Date  . BREAST RECONSTRUCTION WITH PLACEMENT OF TISSUE EXPANDER AND FLEX HD (ACELLULAR HYDRATED DERMIS) Bilateral 03/31/2016   Procedure: BILATERAL BREAST RECONSTRUCTION WITH PLACEMENT OF TISSUE EXPANDER AND FLEX HD (ACELLULAR HYDRATED DERMIS);  Surgeon: Wallace Going, DO;  Location: [facility];  Service: Plastics;  Laterality: Bilateral;  . CESAREAN SECTION  2005  . COLONOSCOPY    . LAPAROSCOPIC VAGINAL HYSTERECTOMY WITH SALPINGO OOPHORECTOMY Bilateral 09/15/2016   Procedure: LAPAROSCOPIC ASSISTED VAGINAL HYSTERECTOMY WITH BILATERAL SALPINGO OOPHORECTOMY;  Surgeon: Molli Posey, MD;  Location: [facility];  Service: Gynecology;  Laterality: Bilateral;  . LIPOSUCTION Bilateral 06/30/2016   Procedure: LIPOSUCTION TO LATERAL CHEST;  Surgeon: Wallace Going, DO;  Location: [facility];  Service: Plastics;  Laterality: Bilateral;  . NIPPLE SPARING MASTECTOMY Bilateral 03/31/2016   Procedure: BILATERAL NIPPLE SPARING MASTECTOMIES;  Surgeon: Autumn Messing III, MD;  Location: [facility];  Service: General;  Laterality: Bilateral;  . POLYPECTOMY    . REMOVAL OF BILATERAL TISSUE EXPANDERS WITH PLACEMENT OF BILATERAL BREAST IMPLANTS Bilateral 06/30/2016   Procedure: REMOVAL OF BILATERAL TISSUE EXPANDERS WITH PLACEMENT OF BILATERAL SILCONE BREAST IMPLANTS;  Surgeon: Wallace Going, DO;  Location: [facility];  Service: Plastics;  Laterality: Bilateral;  . WISDOM TOOTH EXTRACTION      SOCIAL HISTORY: Social History   Tobacco Use  . Smoking status: Former Smoker    Packs/day: 0.50    Years: 20.00    Pack years: 10.00    Types: Cigarettes    Quit date: 12/20/2015    Years since quitting: 2.7  . Smokeless tobacco: Never Used  . Tobacco comment: form given  [id number]  Substance Use Topics  . Alcohol use: Not Currently    Comment: occ  . Drug use: No    FAMILY HISTORY: Family History  Problem Relation Age of Onset  . Breast cancer Mother 42  . Liver cancer Mother   . Alcoholism Mother   . Lung cancer Father 53  . Breast cancer Sister 2  . Breast cancer Maternal Grandmother   . Ovarian cancer Maternal Grandmother   . Colon cancer Maternal Grandmother   . Cancer Paternal Grandfather        NOS  . Melanoma Maternal Aunt        mother's maternal half sister  . Colon polyps Neg Hx   . Rectal cancer Neg Hx   . Stomach cancer Neg Hx   . Esophageal cancer Neg Hx     ROS: Review of Systems  Constitutional: Positive for malaise/fatigue. Negative for weight loss.  Cardiovascular: Negative for chest pain.  Gastrointestinal: Negative for nausea and vomiting.  Genitourinary: Negative for frequency.  Musculoskeletal:       Negative for muscle weakness  Endo/Heme/Allergies: Negative for polydipsia.       Positive for polyphagia    PHYSICAL EXAM: Blood pressure 128/81, pulse 92, temperature 97.9 F (36.6 C), temperature source Oral, height 5\' 3"  (1.6 m), weight 274 lb (124.3 kg), last menstrual period 09/07/2016, SpO2 96 %. Body mass index is 48.54 kg/m. Physical Exam Vitals signs reviewed.  Constitutional:      Appearance: Normal appearance. She is well-developed. She is obese.  Cardiovascular:     Rate and Rhythm: Normal rate.  Pulmonary:     Effort: Pulmonary effort is normal.  Musculoskeletal: Normal range of motion.  Skin:    General: Skin is warm and dry.  Neurological:     Mental Status: She is alert and oriented to person, place, and time.  Psychiatric:        Mood and Affect: Mood normal.        Behavior: Behavior normal.     RECENT LABS AND TESTS: BMET    Component Value Date/Time   NA 140 08/21/2018 0000   K 4.4 08/21/2018 0000   CL 103 08/21/2018 0000   CO2 23 08/21/2018 0000   GLUCOSE 93 08/21/2018 0000    GLUCOSE 85 09/17/2016 0542   BUN 11 08/21/2018 0000   CREATININE 0.67 08/21/2018 0000   CALCIUM 9.3 08/21/2018 0000   GFRNONAA 106 08/21/2018 0000   GFRAA 122 08/21/2018 0000   Lab Results  Component Value Date   HGBA1C 5.4 08/21/2018   Lab Results  Component Value Date   INSULIN 18.6 08/21/2018   CBC    Component Value Date/Time   WBC 7.9 08/21/2018 0000   WBC 14.3 (  H) 09/17/2016 0513   RBC 5.00 08/21/2018 0000   RBC 3.96 09/17/2016 0513   HGB 15.1 08/21/2018 0000   HCT 46.0 08/21/2018 0000   PLT 134 (L) 09/17/2016 0513   MCV 92 08/21/2018 0000   MCH 30.2 08/21/2018 0000   MCH 30.1 09/17/2016 0513   MCHC 32.8 08/21/2018 0000   MCHC 33.4 09/17/2016 0513   RDW 12.5 08/21/2018 0000   LYMPHSABS 2.3 08/21/2018 0000   MONOABS 0.5 09/17/2016 0513   EOSABS 0.3 08/21/2018 0000   BASOSABS 0.1 08/21/2018 0000   Iron/TIBC/Ferritin/ %Sat No results found for: IRON, TIBC, FERRITIN, IRONPCTSAT Lipid Panel     Component Value Date/Time   CHOL 212 (H) 08/21/2018 0000   TRIG 79 08/21/2018 0000   HDL 59 08/21/2018 0000   LDLCALC 137 (H) 08/21/2018 0000   Hepatic Function Panel     Component Value Date/Time   PROT 6.6 08/21/2018 0000   ALBUMIN 4.5 08/21/2018 0000   AST 17 08/21/2018 0000   ALT 24 08/21/2018 0000   ALKPHOS 135 (H) 08/21/2018 0000   BILITOT 0.2 08/21/2018 0000   BILIDIR <0.1 09/12/2009 0345   IBILI NOT CALCULATED 09/12/2009 0345      Component Value Date/Time   TSH 3.220 08/21/2018 0000     Ref. Range 08/21/2018 00:00  Vitamin D, 25-Hydroxy Latest Ref Range: 30.0 - 100.0 ng/mL 11.7 (L)    OBESITY BEHAVIORAL INTERVENTION VISIT  Today's visit was # 2   Starting weight: 272 lbs Starting date: 08/21/2018 Today's weight : 274 lbs Today's date: 09/04/2018 Total lbs lost to date: 0    09/04/2018  Height 5\' 3"  (1.6 m)  Weight 274 lb (124.3 kg)  BMI (Calculated) 48.55  BLOOD PRESSURE - SYSTOLIC 161  BLOOD PRESSURE - DIASTOLIC 81   Body Fat % 09.6 %   Total Body Water (lbs) 91.4 lbs    ASK: We discussed the diagnosis of obesity with Lynn Simpson today and Lynn Simpson agreed to give [place] permission to discuss obesity behavioral modification therapy today.  ASSESS: Lynn Simpson has the diagnosis of obesity and her BMI today is 48.55 Lynn Simpson is in the action stage of change   ADVISE: Malyn was educated on the multiple health risks of obesity as well as the benefit of weight loss to improve her health. She was advised of the need for long term treatment and the importance of lifestyle modifications to improve her current health and to decrease her risk of future health problems.  AGREE: Multiple dietary modification options and treatment options were discussed and  Lynn Simpson agreed to follow the recommendations documented in the above note.  ARRANGE: Lynn Simpson was educated on the importance of frequent visits to treat obesity as outlined per CMS and USPSTF guidelines and agreed to schedule her next follow up appointment today.  I, Doreene Nest, am acting as transcriptionist for Dennard Nip, MD I have reviewed the above documentation for accuracy and completeness, and I agree with the above. -Dennard Nip, MD

## 2018-09-17 NOTE — Progress Notes (Signed)
Office: [phone]  /  Fax: [phone]    Date: September 18, 2018   Appointment Start Time: 9:59am Duration: 28 minutes Provider: Glennie Isle, Psy.D. 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 female presenting via [place] for a follow-up appointment to address the previously established treatment goal of decreasing emotional eating. Today's appointment was a telepsychological visit, as this provider's clinic is seeing a limited number of patients for in-person visits due to COVID-19. Therapeutic services will resume to in-person appointments once deemed appropriate. Lynn Simpson expressed understanding regarding the rationale for telepsychological services, and provided verbal consent for today's appointment. Prior to proceeding with today's appointment, Lynn Simpson's physical location at the time of this appointment was obtained. Lynn Simpson reported she was at work and provided the address. In the event of technical difficulties, Lynn Simpson shared a phone number she could be reached at. Lynn Simpson and this provider participated in today's telepsychological service. Also, Lynn Simpson denied anyone else being present in the room or on the WebEx appointment.  This provider conducted a brief check-in and verbally administered the PHQ-9 and GAD-7. 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 Based on her history, a risk assessment was completed. Lynn Simpson denied experiencing suicidal and homicidal ideation, plan, and intent since the last appointment with this provider. She reported she continues to have easy access to the developed safety plan, and continues to acknowledge understanding regarding the  importance of reaching out to trusted individuals and/or emergency resources if she is unable to ensure safety. Regarding eating, Lynn Simpson stated using her 300 snack calories toward [place] bars in the evenings. Since the last appointment with this provider, she denied episodes of emotional eating. Lynn Simpson reported the accountability has helped. This was positively reinforced. Furthermore, psychoeducation regarding triggers for emotional eating was provided. Lynn Simpson was provided a handout, and encouraged to utilize the handout between now and the next appointment to increase awareness of triggers and frequency. Lynn Simpson agreed. This provider also discussed behavioral strategies for specific triggers, such as placing the utensil down when conversing to avoid mindless eating. Lynn Simpson provided verbal consent during today's appointment for this provider to send the handout via e-mail. Lynn Simpson was receptive to today's session as evidenced by openness to sharing, responsiveness to feedback, and willingness to identify triggers for emotional eating.   Mental Status Examination:  Appearance: neat Behavior: cooperative Mood: euthymic Affect: mood congruent Speech: normal in rate, volume, and tone Eye Contact: appropriate Psychomotor Activity: appropriate Thought Process: linear, logical, and goal directed  Content/Perceptual Disturbances: denies suicidal and homicidal ideation, plan, and intent and no hallucinations, delusions, bizarre thinking or behavior reported or observed Orientation: time, person, place and purpose of appointment Cognition/Sensorium: memory, attention, language, and fund of knowledge intact  Insight: good Judgment: good  Structured Assessment Results: The Patient Health Questionnaire-9 (PHQ-9) is a self-report measure that assesses symptoms and severity of depression over the course of the last two weeks. Lynn Simpson obtained a score of 5 suggesting mild depression. Lynn Simpson finds the endorsed symptoms to be not  difficult at all. Lynn Simpson interest or pleasure in doing things 0  Feeling down, depressed, or hopeless 0  Trouble falling or staying asleep, or sleeping too much 2  Feeling tired or having Lynn Simpson energy 3  Poor appetite or overeating 0  Feeling bad about yourself --- or that you are a failure or have let yourself or your family down 0  Trouble concentrating on things, such as reading the newspaper or watching television 0  Moving or speaking so slowly that other people could have noticed? Or the opposite --- being so fidgety or restless that you have been moving around a lot more than usual 0  Thoughts that you would be better off dead or hurting yourself in some way 0  PHQ-9 Score 5    The Generalized Anxiety Disorder-7 (GAD-7) is a brief self-report measure that assesses symptoms of anxiety over the course of the last two weeks. Lynn Simpson obtained a score of 3 suggesting minimal anxiety. Lynn Simpson finds the endorsed symptoms to be not difficult at all. Feeling nervous, anxious, on edge 0  Not being able to stop or control worrying 0  Worrying too much about different things 3  Trouble relaxing 0  Being so restless that it's hard to sit still 0  Becoming easily annoyed or irritable 0  Feeling afraid as if something awful might happen 0  GAD-7 Score 3   Interventions:  Conducted a brief chart review Verbal administration of PHQ-9 and GAD-7 for symptom monitoring Provided empathic reflections and validation Reviewed content from the previous session Psychoeducation provided regarding triggers for emotional eating Provided positive reinforcement Focused on rapport building Employed supportive psychotherapy interventions to facilitate reduced distress, and to improve coping skills with identified stressors  Risk assessment completed  DSM-5 Diagnosis: 296.31 (F33.0) Major Depressive Disorder, Recurrent Episode, Mild, With Anxious Distress  Treatment Goal & Progress: During the initial appointment  with this provider, the following treatment goal was established: decrease emotional eating. Progress is limited, as Lynn Simpson has just begun treatment with this provider; however, she is receptive to the interaction and interventions and rapport is being established. Nevertheless, Lynn Simpson has demonstrated some progress in her goal as evidenced by increased awareness of hunger patterns.   Plan: Lynn Simpson continues to appear able and willing to participate as evidenced by engagement in reciprocal conversation, and asking questions for clarification as appropriate. Due to Lynn Simpson going on vacation, the next appointment will be scheduled in three weeks, which will be via [employer]. Once this provider's office resumes in-person appointments and it is deemed appropriate, Lynn Simpson will be notified. The next session will focus on reviewing triggers for emotional eating, and the introduction of mindfulness.

## 2018-09-18 ENCOUNTER — Other Ambulatory Visit: Payer: Self-pay

## 2018-09-18 ENCOUNTER — Ambulatory Visit (INDEPENDENT_AMBULATORY_CARE_PROVIDER_SITE_OTHER): Payer: BLUE CROSS/BLUE SHIELD | Admitting: Family Medicine

## 2018-09-18 ENCOUNTER — Encounter (INDEPENDENT_AMBULATORY_CARE_PROVIDER_SITE_OTHER): Payer: Self-pay | Admitting: Family Medicine

## 2018-09-18 ENCOUNTER — Ambulatory Visit (INDEPENDENT_AMBULATORY_CARE_PROVIDER_SITE_OTHER): Payer: BC Managed Care – PPO | Admitting: Psychology

## 2018-09-18 VITALS — BP 136/75 | HR 83 | Temp 98.2°F | Ht 63.0 in | Wt 266.0 lb

## 2018-09-18 DIAGNOSIS — F418 Other specified anxiety disorders: Secondary | ICD-10-CM

## 2018-09-18 DIAGNOSIS — Z6841 Body Mass Index (BMI) 40.0 and over, adult: Secondary | ICD-10-CM

## 2018-09-18 DIAGNOSIS — F330 Major depressive disorder, recurrent, mild: Secondary | ICD-10-CM

## 2018-09-19 NOTE — Progress Notes (Signed)
Office: [phone]  /  Fax: [phone]   HPI:   Chief Complaint: OBESITY Lynn Simpson is here to discuss her progress with her obesity treatment plan. She is on the Category 3 plan and is following her eating plan approximately 100 % of the time. She states she is exercising 0 minutes 0 times per week. Lynn Simpson did very well on her Category 3 plan. She notes her hunger is controlled and she is getting good support at home. She is not getting bored yet and is doing well with planning meals and eating her 100 calorie snack.  Her weight is 266 lb (120.7 kg) today and has had a weight loss of 8 pounds over a period of 2 weeks since her last visit. She has lost 6 lbs since starting treatment with [place].  Depression with Anxiety Lynn Simpson is stable on Zoloft and she is doing therapy with Dr. Mallie Mussel. She feels she is more in control of her emotional eating. Lynn Simpson struggles with emotional eating and using food for comfort to the extent that it is negatively impacting her health. She often snacks when she is not hungry. Lynn Simpson sometimes feels she is out of control and then feels guilty that she made poor food choices. She has been working on behavior modification techniques to help reduce her emotional eating and has been somewhat successful. She shows no sign of suicidal or homicidal ideations.  Depression screen PHQ 2/9 08/21/2018  Decreased Interest 3  Down, Depressed, Hopeless 2  PHQ - 2 Score 5  Altered sleeping 2  Tired, decreased energy 3  Change in appetite 3  Feeling bad or failure about yourself  1  Trouble concentrating 2  Moving slowly or fidgety/restless 3  Suicidal thoughts 0  PHQ-9 Score 19  Difficult doing work/chores Very difficult    ASSESSMENT AND PLAN:  Depression with anxiety  Class 3 severe obesity with serious comorbidity and body mass index (BMI) of 45.0 to 49.9 in adult, unspecified obesity type ([place])  PLAN:  Depression with Anxiety We discussed behavior modification techniques  today to help Lynn Simpson deal with her depression and anxiety. Lynn Simpson agrees to continue taking Zoloft and will continue therapy, and she will continue to work on emotional eating. Lynn Simpson agrees to follow up with our clinic in 2 to 3 weeks.  I spent > than 50% of the 15 minute visit on counseling as documented in the note.  Obesity Lynn Simpson is currently in the action stage of change. As such, her goal is to continue with weight loss efforts She has agreed to follow the Category 3 plan Lynn Simpson has been instructed to work up to a goal of 150 minutes of combined cardio and strengthening exercise per week for weight loss and overall health benefits. We discussed the following Behavioral Modification Strategies today: work on meal planning and easy cooking plans, no skipping meals, and emotional eating strategies   Lynn Simpson has agreed to follow up with our clinic in 2 to 3 weeks. She was informed of the importance of frequent follow up visits to maximize her success with intensive lifestyle modifications for her multiple health conditions.  ALLERGIES: Allergies  Allergen Reactions  . Bee Venom Anaphylaxis  . Wellbutrin [Bupropion] Anaphylaxis    MEDICATIONS: Current Outpatient Medications on File Prior to Visit  Medication Sig Dispense Refill  . albuterol (VENTOLIN HFA) 108 (90 Base) MCG/ACT inhaler Inhale into the lungs every 6 (six) hours as needed for wheezing or shortness of breath.    . clonazePAM (KLONOPIN)  0.5 MG tablet daily as needed.    Marland Kitchen esomeprazole (NEXIUM) 40 MG capsule Take 1 capsule (40 mg total) by mouth 2 (two) times daily. 60 capsule 5  . sertraline (ZOLOFT) 50 MG tablet Take 1 tablet by mouth daily.    . Vitamin D, Ergocalciferol, (DRISDOL) 1.25 MG (50000 UT) CAPS capsule Take 1 capsule (50,000 Units total) by mouth every 7 (seven) days. 4 capsule 0   No current facility-administered medications on file prior to visit.     PAST MEDICAL HISTORY: Past Medical History:  Diagnosis Date  .  Allergy   . Anxiety   . Asthma   . Back pain   . Cancer ([place]) 2018   Left Breast  . Depression   . Dyspnea   . Family history of breast cancer   . Family history of melanoma   . Family history of ovarian cancer   . Gallstones   . GERD (gastroesophageal reflux disease)   . IBS (irritable bowel syndrome)   . Lower extremity edema   . Lynch syndrome   . Lynch syndrome   . Migraine   . Obesity   . Ovarian cyst   . Vitamin D deficiency     PAST SURGICAL HISTORY: Past Surgical History:  Procedure Laterality Date  . BREAST RECONSTRUCTION WITH PLACEMENT OF TISSUE EXPANDER AND FLEX HD (ACELLULAR HYDRATED DERMIS) Bilateral 03/31/2016   Procedure: BILATERAL BREAST RECONSTRUCTION WITH PLACEMENT OF TISSUE EXPANDER AND FLEX HD (ACELLULAR HYDRATED DERMIS);  Surgeon: Wallace Going, DO;  Location: [facility];  Service: Plastics;  Laterality: Bilateral;  . CESAREAN SECTION  2005  . COLONOSCOPY    . LAPAROSCOPIC VAGINAL HYSTERECTOMY WITH SALPINGO OOPHORECTOMY Bilateral 09/15/2016   Procedure: LAPAROSCOPIC ASSISTED VAGINAL HYSTERECTOMY WITH BILATERAL SALPINGO OOPHORECTOMY;  Surgeon: Molli Posey, MD;  Location: [facility];  Service: Gynecology;  Laterality: Bilateral;  . LIPOSUCTION Bilateral 06/30/2016   Procedure: LIPOSUCTION TO LATERAL CHEST;  Surgeon: Wallace Going, DO;  Location: [facility];  Service: Plastics;  Laterality: Bilateral;  . NIPPLE SPARING MASTECTOMY Bilateral 03/31/2016   Procedure: BILATERAL NIPPLE SPARING MASTECTOMIES;  Surgeon: Autumn Messing III, MD;  Location: [place] [facility];  Service: General;  Laterality: Bilateral;  . POLYPECTOMY    . REMOVAL OF BILATERAL TISSUE EXPANDERS WITH PLACEMENT OF BILATERAL BREAST IMPLANTS Bilateral 06/30/2016   Procedure: REMOVAL OF BILATERAL TISSUE EXPANDERS WITH PLACEMENT OF BILATERAL SILCONE BREAST IMPLANTS;  Surgeon: Wallace Going, DO;  Location: [facility];  Service: Plastics;   Laterality: Bilateral;  . WISDOM TOOTH EXTRACTION      SOCIAL HISTORY: Social History   Tobacco Use  . Smoking status: Former Smoker    Packs/day: 0.50    Years: 20.00    Pack years: 10.00    Types: Cigarettes    Quit date: 12/20/2015    Years since quitting: 2.7  . Smokeless tobacco: Never Used  . Tobacco comment: form given [id number]  Substance Use Topics  . Alcohol use: Not Currently    Comment: occ  . Drug use: No    FAMILY HISTORY: Family History  Problem Relation Age of Onset  . Breast cancer Mother 10  . Liver cancer Mother   . Alcoholism Mother   . Lung cancer Father 60  . Breast cancer Sister 33  . Breast cancer Maternal Grandmother   . Ovarian cancer Maternal Grandmother   . Colon cancer Maternal Grandmother   . Cancer Paternal Grandfather  NOS  . Melanoma Maternal Aunt        mother's maternal half sister  . Colon polyps Neg Hx   . Rectal cancer Neg Hx   . Stomach cancer Neg Hx   . Esophageal cancer Neg Hx     ROS: Review of Systems  Constitutional: Positive for weight loss.  Psychiatric/Behavioral: Positive for depression. Negative for suicidal ideas.       + Anxiety    PHYSICAL EXAM: Blood pressure 136/75, pulse 83, temperature 98.2 F (36.8 C), temperature source Oral, height 5\' 3"  (1.6 m), weight 266 lb (120.7 kg), last menstrual period 09/07/2016, SpO2 97 %. Body mass index is 47.12 kg/m. Physical Exam Vitals signs reviewed.  Constitutional:      Appearance: Normal appearance. She is obese.  Cardiovascular:     Rate and Rhythm: Normal rate.     Pulses: Normal pulses.  Pulmonary:     Effort: Pulmonary effort is normal.     Breath sounds: Normal breath sounds.  Musculoskeletal: Normal range of motion.  Skin:    General: Skin is warm and dry.  Neurological:     Mental Status: She is alert and oriented to person, place, and time.  Psychiatric:        Mood and Affect: Mood normal.        Behavior: Behavior normal.     RECENT  LABS AND TESTS: BMET    Component Value Date/Time   NA 140 08/21/2018 0000   K 4.4 08/21/2018 0000   CL 103 08/21/2018 0000   CO2 23 08/21/2018 0000   GLUCOSE 93 08/21/2018 0000   GLUCOSE 85 09/17/2016 0542   BUN 11 08/21/2018 0000   CREATININE 0.67 08/21/2018 0000   CALCIUM 9.3 08/21/2018 0000   GFRNONAA 106 08/21/2018 0000   GFRAA 122 08/21/2018 0000   Lab Results  Component Value Date   HGBA1C 5.4 08/21/2018   Lab Results  Component Value Date   INSULIN 18.6 08/21/2018   CBC    Component Value Date/Time   WBC 7.9 08/21/2018 0000   WBC 14.3 (H) 09/17/2016 0513   RBC 5.00 08/21/2018 0000   RBC 3.96 09/17/2016 0513   HGB 15.1 08/21/2018 0000   HCT 46.0 08/21/2018 0000   PLT 134 (L) 09/17/2016 0513   MCV 92 08/21/2018 0000   MCH 30.2 08/21/2018 0000   MCH 30.1 09/17/2016 0513   MCHC 32.8 08/21/2018 0000   MCHC 33.4 09/17/2016 0513   RDW 12.5 08/21/2018 0000   LYMPHSABS 2.3 08/21/2018 0000   MONOABS 0.5 09/17/2016 0513   EOSABS 0.3 08/21/2018 0000   BASOSABS 0.1 08/21/2018 0000   Iron/TIBC/Ferritin/ %Sat No results found for: IRON, TIBC, FERRITIN, IRONPCTSAT Lipid Panel     Component Value Date/Time   CHOL 212 (H) 08/21/2018 0000   TRIG 79 08/21/2018 0000   HDL 59 08/21/2018 0000   LDLCALC 137 (H) 08/21/2018 0000   Hepatic Function Panel     Component Value Date/Time   PROT 6.6 08/21/2018 0000   ALBUMIN 4.5 08/21/2018 0000   AST 17 08/21/2018 0000   ALT 24 08/21/2018 0000   ALKPHOS 135 (H) 08/21/2018 0000   BILITOT 0.2 08/21/2018 0000   BILIDIR <0.1 09/12/2009 0345   IBILI NOT CALCULATED 09/12/2009 0345      Component Value Date/Time   TSH 3.220 08/21/2018 0000      OBESITY BEHAVIORAL INTERVENTION VISIT  Today's visit was # 3   Starting weight: 272 lbs Starting date: 08/21/2018 Today's  weight : 266 lbs Today's date: 09/18/2018 Total lbs lost to date: 6    ASK: We discussed the diagnosis of obesity with Madelyn Brunner today and [place]  agreed to give [place] permission to discuss obesity behavioral modification therapy today.  ASSESS: Lynn Simpson has the diagnosis of obesity and her BMI today is 47.13 Lynn Simpson is in the action stage of change   ADVISE: Lynn Simpson was educated on the multiple health risks of obesity as well as the benefit of weight loss to improve her health. She was advised of the need for long term treatment and the importance of lifestyle modifications to improve her current health and to decrease her risk of future health problems.  AGREE: Multiple dietary modification options and treatment options were discussed and  Lynn Simpson agreed to follow the recommendations documented in the above note.  ARRANGE: Lynn Simpson was educated on the importance of frequent visits to treat obesity as outlined per CMS and USPSTF guidelines and agreed to schedule her next follow up appointment today.  I, Trixie Dredge, am acting as transcriptionist for Dennard Nip, MD  I have reviewed the above documentation for accuracy and completeness, and I agree with the above. -Dennard Nip, MD

## 2018-10-08 ENCOUNTER — Other Ambulatory Visit: Payer: Self-pay

## 2018-10-08 ENCOUNTER — Ambulatory Visit (INDEPENDENT_AMBULATORY_CARE_PROVIDER_SITE_OTHER): Payer: BC Managed Care – PPO | Admitting: Psychology

## 2018-10-08 DIAGNOSIS — F330 Major depressive disorder, recurrent, mild: Secondary | ICD-10-CM

## 2018-10-08 NOTE — Progress Notes (Signed)
Office: [phone]  /  Fax: [phone]    Date: October 08, 2018    Appointment Start Time: 2:30pm Duration: 27 minutes Provider: Glennie Isle, Psy.D. Type of Session: Individual Therapy  Location of Patient: Car in parking lot at eye doctor's office Location of Provider: Provider's Home Type of Contact: Telepsychological Visit via [employer]   Session Content: Lynn Simpson is a 48 y.o. female presenting via [place] for a follow-up appointment to address the previously established treatment goal of decreasing emotional eating. Today's appointment was a telepsychological visit, as this provider's clinic is seeing a limited number of patients for in-person visits due to COVID-19. Therapeutic services will resume to in-person appointments once deemed appropriate. Lynn Simpson expressed understanding regarding the rationale for telepsychological services, and provided verbal consent for today's appointment. Prior to proceeding with today's appointment, Lynn Simpson's physical location at the time of this appointment was obtained. Lynn Simpson reported she was in the parking lot in her car at her eye doctor's office and provided the address. In the event of technical difficulties, Lynn Simpson shared a phone number she could be reached at. Lynn Simpson and this provider participated in today's telepsychological service. Also, Lynn Simpson denied anyone else being present in the car or on the WebEx appointment.  This provider conducted a brief check-in and verbally administered the PHQ-9 and GAD-7. Lynn Simpson stated she was taken out of work because of COVID-19 concerns in her workplace. She further shared she was on vacation at the beach in the past week. Lynn Simpson described having a good time. She added, "We did eat a lot." A risk assessment was completed. Lynn Simpson denied experiencing suicidal and homicidal ideation, plan, and intent since the last appointment with this provider. She reported she continues to have easy access to the developed safety plan, and  continues to acknowledge understanding regarding the importance of reaching out to trusted individuals and/or emergency resources if she is unable to ensure safety.   Notably, the reduction in symptomatology on the PHQ-9 and GAD-7 was reflected. Lynn Simpson discussed changing her eating habits has helped her mood. Lynn Simpson added "the hope of having a plan" has also helped. She further discussed eating congruent to the meal plan prior to vacation, and denied episodes of emotional eating. Her eating habits while on vacation were explored. Lynn Simpson acknowledged she ate congruent to her meal plan for breakfast and lunch. She stated they ate out for dinner, but she described making better choices and focused on protein intake. This was positively reinforced. Regarding triggers for emotional eating, Lynn Simpson acknowledged experiencing boredom and loneliness and unpleasant emotions. Furthermore, psychoeducation regarding mindfulness was provided. A handout was provided to Lynn Simpson with further information regarding mindfulness, including exercises. This provider also explained the benefit of mindfulness as it relates to emotional eating. Lynn Simpson was encouraged to engage in the provided exercises between now and the next appointment with this provider. Lynn Simpson agreed. She was led through an exercise during today's appointment involving her senses. Lynn Simpson provided verbal consent during today's appointment for this provider to send the handout on mindfulness via e-mail. Lynn Simpson was receptive to today's session as evidenced by openness to sharing, responsiveness to feedback, and willingness to engage in mindfulness to assist with coping.  Mental Status Examination:  Appearance: neat Behavior: cooperative Mood: euthymic Affect: mood congruent Speech: normal in rate, volume, and tone Eye Contact: appropriate Psychomotor Activity: appropriate Thought Process: linear, logical, and goal directed  Content/Perceptual Disturbances: denies suicidal and  homicidal ideation, plan, and intent and no hallucinations, delusions, bizarre  thinking or behavior reported or observed Orientation: time, person, place and purpose of appointment Cognition/Sensorium: memory, attention, language, and fund of knowledge intact  Insight: good Judgment: good  Structured Assessment Results: The Patient Health Questionnaire-9 (PHQ-9) is a self-report measure that assesses symptoms and severity of depression over the course of the last two weeks. Lynn Simpson obtained a score of 1 suggesting minimal depression. Lynn Simpson finds the endorsed symptoms to be not difficult at all. Little interest or pleasure in doing things 0  Feeling down, depressed, or hopeless 0  Trouble falling or staying asleep, or sleeping too much 0  Feeling tired or having little energy 0  Poor appetite or overeating 1  Feeling bad about yourself --- or that you are a failure or have let yourself or your family down 0  Trouble concentrating on things, such as reading the newspaper or watching television 0  Moving or speaking so slowly that other people could have noticed? Or the opposite --- being so fidgety or restless that you have been moving around a lot more than usual 0  Thoughts that you would be better off dead or hurting yourself in some way 0  PHQ-9 Score 1    The Generalized Anxiety Disorder-7 (GAD-7) is a brief self-report measure that assesses symptoms of anxiety over the course of the last two weeks. Lynn Simpson obtained a score of 0. Feeling nervous, anxious, on edge 0  Not being able to stop or control worrying 0  Worrying too much about different things 0  Trouble relaxing 0  Being so restless that it's hard to sit still 0  Becoming easily annoyed or irritable 0  Feeling afraid as if something awful might happen 0  GAD-7 Score 0   Interventions:  Conducted a brief chart review Verbal administration of PHQ-9 and GAD-7 for symptom monitoring Provided empathic reflections and  validation Reviewed content from the previous session Psychoeducation provided regarding mindfulness Engaged patient in a mindfulness exercise Provided positive reinforcement Employed supportive psychotherapy interventions to facilitate reduced distress, and to improve coping skills with identified stressors Employed acceptance and commitment interventions to emphasize mindfulness and acceptance without struggle  DSM-5 Diagnosis: 296.31 (F33.0) Major Depressive Disorder, Recurrent Episode, Mild, With Anxious Distress  Treatment Goal & Progress: During the initial appointment with this provider, the following treatment goal was established: decrease emotional eating. Lynn Simpson has demonstrated progress in her goal as evidenced by increased awareness of hunger patterns and triggers for emotional eating. She also described a reduction in emotional eating since the onset of treatment with this provider.   Plan: Lynn Simpson continues to appear able and willing to participate as evidenced by engagement in reciprocal conversation, and asking questions for clarification as appropriate. The next appointment will be scheduled in two weeks, which will be via [employer]. Once this provider's office resumes in-person appointments and it is deemed appropriate, Lynn Simpson will be notified. The next session will focus further on mindfulness.

## 2018-10-09 ENCOUNTER — Ambulatory Visit (INDEPENDENT_AMBULATORY_CARE_PROVIDER_SITE_OTHER): Payer: BC Managed Care – PPO | Admitting: Family Medicine

## 2018-10-09 ENCOUNTER — Encounter (INDEPENDENT_AMBULATORY_CARE_PROVIDER_SITE_OTHER): Payer: Self-pay | Admitting: Family Medicine

## 2018-10-09 VITALS — BP 120/74 | HR 98 | Temp 98.0°F | Ht 63.0 in | Wt 267.0 lb

## 2018-10-09 DIAGNOSIS — E559 Vitamin D deficiency, unspecified: Secondary | ICD-10-CM | POA: Diagnosis not present

## 2018-10-09 DIAGNOSIS — Z9189 Other specified personal risk factors, not elsewhere classified: Secondary | ICD-10-CM | POA: Diagnosis not present

## 2018-10-09 DIAGNOSIS — Z6841 Body Mass Index (BMI) 40.0 and over, adult: Secondary | ICD-10-CM

## 2018-10-09 DIAGNOSIS — E7849 Other hyperlipidemia: Secondary | ICD-10-CM

## 2018-10-09 MED ORDER — VITAMIN D (ERGOCALCIFEROL) 1.25 MG (50000 UNIT) PO CAPS: 50000.0000 [IU] | ORAL_CAPSULE | ORAL | 0 refills | Status: DC

## 2018-10-10 NOTE — Progress Notes (Signed)
Office: [phone]  /  Fax: [phone]   HPI:   Chief Complaint: OBESITY Lynn Simpson is here to discuss her progress with her obesity treatment plan. She is on the  follow the Category 3 plan and is following her eating plan approximately 70 % of the time. She states she is exercising 0 minutes 0 times per week. Miniya went on vacation and had some celebration eating but was mindful of her food choices. Her husband has been supportive of her eating healthy but has gotten off track and is starting to sabotage her.  Her weight is 267 lb (121.1 kg) today and has had a weight gain of 1 pounds over a period of 3 weeks since her last visit. She has lost 5 lbs since starting treatment with [place].  Vitamin D deficiency Lynn Simpson has a diagnosis of vitamin D deficiency. She is currently taking vit D and denies nausea, vomiting or muscle weakness. Vitamin D level not yet at goal.   Hyperlipidemia Lynn Simpson has hyperlipidemia and has been trying to improve her cholesterol levels with intensive lifestyle modification including a low saturated fat diet, exercise and weight loss. She denies any chest pain, claudication or myalgias.  At risk for osteopenia and osteoporosis Lynn Simpson is at higher risk of osteopenia and osteoporosis due to vitamin D deficiency.   ASSESSMENT AND PLAN:  Vitamin D deficiency - Plan: Vitamin D, Ergocalciferol, (DRISDOL) 1.25 MG (50000 UT) CAPS capsule  Other hyperlipidemia  At risk for osteoporosis  Class 3 severe obesity with serious comorbidity and body mass index (BMI) of 45.0 to 49.9 in adult, unspecified obesity type ([place])  PLAN: Vitamin D Deficiency Lynn Simpson was informed that low vitamin D levels contributes to fatigue and are associated with obesity, breast, and colon cancer. She agrees to continue to take prescription Vit D @50 ,000 IU every week #4 with no refills and will follow up for routine testing of vitamin D, at least 2-3 times per year. She was informed of the risk of  over-replacement of vitamin D and agrees to not increase her dose unless she discusses this with [place] first. Agrees to follow up with our clinic as directed.   Hyperlipidemia Lynn Simpson was informed of the American Heart Association Guidelines emphasizing intensive lifestyle modifications as the first line treatment for hyperlipidemia. We discussed many lifestyle modifications today in depth, and Lynn Simpson will continue to work on decreasing saturated fats such as fatty red meat, butter and many fried foods. She will also increase vegetables and lean protein in her diet and continue to work on exercise and weight loss efforts. We will repeat labs in 2 months.   At risk for osteopenia and osteoporosis Lynn Simpson was given extended  (15 minutes) osteoporosis prevention counseling today. Lynn Simpson is at risk for osteopenia and osteoporsis due to her vitamin D deficiency. She was encouraged to take her vitamin D and follow her higher calcium diet and increase strengthening exercise to help strengthen her bones and decrease her risk of osteopenia and osteoporosis.  Obesity Lynn Simpson is currently in the action stage of change. As such, her goal is to continue with weight loss efforts She has agreed to follow the Category 3 plan Lynn Simpson has been instructed to work up to a goal of 150 minutes of combined cardio and strengthening exercise per week for weight loss and overall health benefits. We discussed the following Behavioral Modification Stratagies today: increasing lean protein intake, decreasing simple carbohydrates  and work on meal planning and easy cooking plans   Lynn Simpson  has agreed to follow up with our clinic in 2 weeks. She was informed of the importance of frequent follow up visits to maximize her success with intensive lifestyle modifications for her multiple health conditions.  ALLERGIES: Allergies  Allergen Reactions   Bee Venom Anaphylaxis   Wellbutrin [Bupropion] Anaphylaxis    MEDICATIONS: Current Outpatient  Medications on File Prior to Visit  Medication Sig Dispense Refill   albuterol (VENTOLIN HFA) 108 (90 Base) MCG/ACT inhaler Inhale into the lungs every 6 (six) hours as needed for wheezing or shortness of breath.     clonazePAM (KLONOPIN) 0.5 MG tablet daily as needed.     esomeprazole (NEXIUM) 40 MG capsule Take 1 capsule (40 mg total) by mouth 2 (two) times daily. 60 capsule 5   sertraline (ZOLOFT) 50 MG tablet Take 1 tablet by mouth daily.     No current facility-administered medications on file prior to visit.     PAST MEDICAL HISTORY: Past Medical History:  Diagnosis Date   Allergy    Anxiety    Asthma    Back pain    Cancer ([place]) 2018   Left Breast   Depression    Dyspnea    Family history of breast cancer    Family history of melanoma    Family history of ovarian cancer    Gallstones    GERD (gastroesophageal reflux disease)    IBS (irritable bowel syndrome)    Lower extremity edema    Lynch syndrome    Lynch syndrome    Migraine    Obesity    Ovarian cyst    Vitamin D deficiency     PAST SURGICAL HISTORY: Past Surgical History:  Procedure Laterality Date   BREAST RECONSTRUCTION WITH PLACEMENT OF TISSUE EXPANDER AND FLEX HD (ACELLULAR HYDRATED DERMIS) Bilateral 03/31/2016   Procedure: BILATERAL BREAST RECONSTRUCTION WITH PLACEMENT OF TISSUE EXPANDER AND FLEX HD (ACELLULAR HYDRATED DERMIS);  Surgeon: Wallace Going, DO;  Location: [facility];  Service: Plastics;  Laterality: Bilateral;   CESAREAN SECTION  2005   COLONOSCOPY     LAPAROSCOPIC VAGINAL HYSTERECTOMY WITH SALPINGO OOPHORECTOMY Bilateral 09/15/2016   Procedure: LAPAROSCOPIC ASSISTED VAGINAL HYSTERECTOMY WITH BILATERAL SALPINGO OOPHORECTOMY;  Surgeon: Molli Posey, MD;  Location: [facility];  Service: Gynecology;  Laterality: Bilateral;   LIPOSUCTION Bilateral 06/30/2016   Procedure: LIPOSUCTION TO LATERAL CHEST;  Surgeon: Wallace Going, DO;  Location:  [facility];  Service: Plastics;  Laterality: Bilateral;   NIPPLE SPARING MASTECTOMY Bilateral 03/31/2016   Procedure: BILATERAL NIPPLE SPARING MASTECTOMIES;  Surgeon: Autumn Messing III, MD;  Location: [facility];  Service: General;  Laterality: Bilateral;   POLYPECTOMY     REMOVAL OF BILATERAL TISSUE EXPANDERS WITH PLACEMENT OF BILATERAL BREAST IMPLANTS Bilateral 06/30/2016   Procedure: REMOVAL OF BILATERAL TISSUE EXPANDERS WITH PLACEMENT OF BILATERAL SILCONE BREAST IMPLANTS;  Surgeon: Wallace Going, DO;  Location: [facility];  Service: Plastics;  Laterality: Bilateral;   WISDOM TOOTH EXTRACTION      SOCIAL HISTORY: Social History   Tobacco Use   Smoking status: Former Smoker    Packs/day: 0.50    Years: 20.00    Pack years: 10.00    Types: Cigarettes    Quit date: 12/20/2015    Years since quitting: 2.8   Smokeless tobacco: Never Used   Tobacco comment: form given [id number]  Substance Use Topics   Alcohol use: Not Currently    Comment: occ   Drug use: No  FAMILY HISTORY: Family History  Problem Relation Age of Onset   Breast cancer Mother 37   Liver cancer Mother    Alcoholism Mother    Lung cancer Father 8   Breast cancer Sister 42   Breast cancer Maternal Grandmother    Ovarian cancer Maternal Grandmother    Colon cancer Maternal Grandmother    Cancer Paternal Grandfather        NOS   Melanoma Maternal Aunt        mother's maternal half sister   Colon polyps Neg Hx    Rectal cancer Neg Hx    Stomach cancer Neg Hx    Esophageal cancer Neg Hx     ROS: Review of Systems  Constitutional: Negative for weight loss.  Cardiovascular: Negative for chest pain and claudication.  Gastrointestinal: Negative for nausea and vomiting.  Musculoskeletal: Negative for myalgias.       Negative for muscle weakness    PHYSICAL EXAM: Blood pressure 120/74, pulse 98, temperature 98 F (36.7 C), temperature  source Oral, height 5\' 3"  (1.6 m), weight 267 lb (121.1 kg), last menstrual period 09/07/2016, SpO2 96 %. Body mass index is 47.3 kg/m. Physical Exam Vitals signs reviewed.  Constitutional:      Appearance: Normal appearance. She is obese.  HENT:     Head: Normocephalic.     Nose: Nose normal.  Neck:     Musculoskeletal: Normal range of motion.  Cardiovascular:     Rate and Rhythm: Normal rate.  Pulmonary:     Effort: Pulmonary effort is normal.  Musculoskeletal: Normal range of motion.  Skin:    General: Skin is warm and dry.  Neurological:     Mental Status: She is alert and oriented to person, place, and time.  Psychiatric:        Mood and Affect: Mood normal.        Behavior: Behavior normal.     RECENT LABS AND TESTS: BMET    Component Value Date/Time   NA 140 08/21/2018 0000   K 4.4 08/21/2018 0000   CL 103 08/21/2018 0000   CO2 23 08/21/2018 0000   GLUCOSE 93 08/21/2018 0000   GLUCOSE 85 09/17/2016 0542   BUN 11 08/21/2018 0000   CREATININE 0.67 08/21/2018 0000   CALCIUM 9.3 08/21/2018 0000   GFRNONAA 106 08/21/2018 0000   GFRAA 122 08/21/2018 0000   Lab Results  Component Value Date   HGBA1C 5.4 08/21/2018   Lab Results  Component Value Date   INSULIN 18.6 08/21/2018   CBC    Component Value Date/Time   WBC 7.9 08/21/2018 0000   WBC 14.3 (H) 09/17/2016 0513   RBC 5.00 08/21/2018 0000   RBC 3.96 09/17/2016 0513   HGB 15.1 08/21/2018 0000   HCT 46.0 08/21/2018 0000   PLT 134 (L) 09/17/2016 0513   MCV 92 08/21/2018 0000   MCH 30.2 08/21/2018 0000   MCH 30.1 09/17/2016 0513   MCHC 32.8 08/21/2018 0000   MCHC 33.4 09/17/2016 0513   RDW 12.5 08/21/2018 0000   LYMPHSABS 2.3 08/21/2018 0000   MONOABS 0.5 09/17/2016 0513   EOSABS 0.3 08/21/2018 0000   BASOSABS 0.1 08/21/2018 0000   Iron/TIBC/Ferritin/ %Sat No results found for: IRON, TIBC, FERRITIN, IRONPCTSAT Lipid Panel     Component Value Date/Time   CHOL 212 (H) 08/21/2018 0000   TRIG  79 08/21/2018 0000   HDL 59 08/21/2018 0000   LDLCALC 137 (H) 08/21/2018 0000   Hepatic Function Panel  Component Value Date/Time   PROT 6.6 08/21/2018 0000   ALBUMIN 4.5 08/21/2018 0000   AST 17 08/21/2018 0000   ALT 24 08/21/2018 0000   ALKPHOS 135 (H) 08/21/2018 0000   BILITOT 0.2 08/21/2018 0000   BILIDIR <0.1 09/12/2009 0345   IBILI NOT CALCULATED 09/12/2009 0345      Component Value Date/Time   TSH 3.220 08/21/2018 0000     Ref. Range 08/21/2018 00:00  Vitamin D, 25-Hydroxy Latest Ref Range: 30.0 - 100.0 ng/mL 11.7 (L)     OBESITY BEHAVIORAL INTERVENTION VISIT  Today's visit was # 4   Starting weight: 272 lbs Starting date: 08/21/18 Today's weight : Weight: 267 lb (121.1 kg)  Today's date: 10/09/18 Total lbs lost to date: 5 lbs   ASK: We discussed the diagnosis of obesity with Lynn Simpson today and Lynn Simpson agreed to give [place] permission to discuss obesity behavioral modification therapy today.  ASSESS: Lynn Simpson has the diagnosis of obesity and her BMI today is 47.31 Lynn Simpson is in the action stage of change   ADVISE: Lynn Simpson was educated on the multiple health risks of obesity as well as the benefit of weight loss to improve her health. She was advised of the need for long term treatment and the importance of lifestyle modifications to improve her current health and to decrease her risk of future health problems.  AGREE: Multiple dietary modification options and treatment options were discussed and  Lynn Simpson agreed to follow the recommendations documented in the above note.  ARRANGE: Lynn Simpson was educated on the importance of frequent visits to treat obesity as outlined per CMS and USPSTF guidelines and agreed to schedule her next follow up appointment today.  Leary Roca, am acting as Location manager for Dennard Nip, MD

## 2018-10-17 NOTE — Progress Notes (Addendum)
Office: [phone]  /  Fax: [phone]    Date: October 23, 2018   Appointment Start Time: 2:35pm Duration: 22 minutes Provider: Glennie Isle, Psy.D. Type of Session: Individual Therapy  Location of Patient: Home Location of Provider: [facility] Type of Contact: Telepsychological Visit via [employer] Web[employer]   Session Content: Lynn Simpson is a 48 y.o. female presenting via [place] for a follow-up appointment to address the previously established treatment goal of decreasing emotional eating. Of note, this provider called Lynn Simpson at 2:34pm as she did not present for today's appointment. She explained she was looking for the e-mail sent by this provider; therefore, the e-mail with the secure link was re-sent. As such, today's appointment was initiated 5 minutes late. Today's appointment was a telepsychological visit, as this provider's clinic is seeing a limited number of patients for in-person visits due to COVID-19. Therapeutic services will resume to in-person appointments once deemed appropriate. Lynn Simpson expressed understanding regarding the rationale for telepsychological services, and provided verbal consent for today's appointment. Prior to proceeding with today's appointment, Lynn Simpson's physical location at the time of this appointment was obtained. Lynn Simpson reported she was at home and provided the address. In the event of technical difficulties, Lynn Simpson shared a phone number she could be reached at. Lynn Simpson and this provider participated in today's telepsychological service. Also, Lynn Simpson denied anyone else being present in the room or on the WebEx appointment.  This provider conducted a brief check-in and verbally administered the PHQ-9 and GAD-7. Lynn Simpson shared, "I fell off the wagon because of a migraine, but I still lost two pounds." She also shared about a road trip during which she acknowledged snacking. In addition, Lynn Simpson shared she and Lynn Simpson discussed all or nothing thinking during their  last appointment. Further psychoeducation regarding all or nothing thinking was provided. A risk assessment was completed. Lynn Simpson denied experiencing suicidal and homicidal ideation, plan, and intent since the last appointment with this provider. She reported she continues to have easy access to the developed safety plan, and continues to acknowledge understanding regarding the importance of reaching out to trusted individuals and/or emergency resources if she is unable to ensure safety.   Regarding mindfulness, Lynn Simpson shared thinking about mindfulness "a couple times." She recalled engaging in the exercise involving her senses. It was recommended she continue engaging in the exercise involving her senses especially when experiencing all or nothing thinking. She agreed. Lynn Simpson was led through an additional mindfulness exercise during today's appointment focusing on the breath. Following the exercise, Lynn Simpson's experience was processed. She shared the exercise was "relaxing." This provider discussed relaxation is not the goal of mindfulness, rather a pleasant side effect. In addition, this provider discussed the utilization of YouTube for mindfulness exercises, specifically videos by Merri Ray. Lynn Simpson provided verbal consent during today's appointment for this provider to send the handout for today's exercise via e-mail. Lynn Simpson was receptive to today's session as evidenced by openness to sharing, responsiveness to feedback, and willingness to engage in mindfulness exercises.  Mental Status Examination:  Appearance: neat Behavior: cooperative Mood: euthymic Affect: mood congruent Speech: normal in rate, volume, and tone Eye Contact: appropriate Psychomotor Activity: appropriate Thought Process: linear, logical, and goal directed  Content/Perceptual Disturbances: denies suicidal and homicidal ideation, plan, and intent and no hallucinations, delusions, bizarre thinking or behavior reported or observed Orientation:  time, person, place and purpose of appointment Cognition/Sensorium: memory, attention, language, and fund of knowledge intact  Insight: good Judgment: good  Structured Assessment Results: The  Patient Health Questionnaire-9 (PHQ-9) is a self-report measure that assesses symptoms and severity of depression over the course of the last two weeks. Lynn Simpson obtained a score of 1 suggesting minimal depression. Lynn Simpson finds the endorsed symptoms to be not difficult at all. Little interest or pleasure in doing things 0  Feeling down, depressed, or hopeless 0  Trouble falling or staying asleep, or sleeping too much 0  Feeling tired or having little energy 1  Poor appetite or overeating 0  Feeling bad about yourself --- or that you are a failure or have let yourself or your family down 0  Trouble concentrating on things, such as reading the newspaper or watching television 0  Moving or speaking so slowly that other people could have noticed? Or the opposite --- being so fidgety or restless that you have been moving around a lot more than usual 0  Thoughts that you would be better off dead or hurting yourself in some way 0  PHQ-9 Score 1    The Generalized Anxiety Disorder-7 (GAD-7) is a brief self-report measure that assesses symptoms of anxiety over the course of the last two weeks. Lynn Simpson obtained a score of 1 suggesting minimal anxiety. Lynn Simpson finds the endorsed symptoms to be not difficult at all. Feeling nervous, anxious, on edge 1  Not being able to stop or control worrying 0  Worrying too much about different things 0  Trouble relaxing 1  Being so restless that it's hard to sit still 0  Becoming easily annoyed or irritable 0  Feeling afraid as if something awful might happen 0  GAD-7 Score 1   Interventions:  Conducted a brief chart review Verbal administration of PHQ-9 and GAD-7 for symptom monitoring Provided empathic reflections and validation Reviewed content from the previous session Engaged  patient in a mindfulness exercise Psychoeducation provided regarding all-or-nothing thinking Employed supportive psychotherapy interventions to facilitate reduced distress, and to improve coping skills with identified stressors Employed acceptance and commitment interventions to emphasize mindfulness and acceptance without struggle  DSM-5 Diagnosis: 296.31 (F33.0) Major Depressive Disorder, Recurrent Episode, Mild, With Anxious Distress  Treatment Goal & Progress: During the initial appointment with this provider, the following treatment goal was established: decrease emotional eating. Lynn Simpson has demonstrated progress in her goal as evidenced by increased awareness of hunger patterns and triggers for emotional eating. Despite deviations from the structured meal plan and recent snacking, Khalis continues to demonstrate willingness to engage in learned skills.   Plan: Lynn Simpson continues to appear able and willing to participate as evidenced by engagement in reciprocal conversation, and asking questions for clarification as appropriate. Frequency of appointments will be reduced in order to give Lynn Simpson time to engage in learned skills to assist with decreasing emotional eating. She expressed understanding that she may call and request an appointment sooner if needed. The next appointment will be scheduled in three weeks, which will be via [employer]. Once this provider's office resumes in-person appointments and it is deemed appropriate, Lynn Simpson will be notified. The next session will focus on the introduction of thought defusion.

## 2018-10-22 ENCOUNTER — Encounter (INDEPENDENT_AMBULATORY_CARE_PROVIDER_SITE_OTHER): Payer: Self-pay | Admitting: Family Medicine

## 2018-10-22 ENCOUNTER — Other Ambulatory Visit: Payer: Self-pay

## 2018-10-22 ENCOUNTER — Ambulatory Visit (INDEPENDENT_AMBULATORY_CARE_PROVIDER_SITE_OTHER): Payer: BC Managed Care – PPO | Admitting: Family Medicine

## 2018-10-22 VITALS — BP 128/83 | HR 83 | Temp 97.8°F | Ht 63.0 in | Wt 265.0 lb

## 2018-10-22 DIAGNOSIS — F418 Other specified anxiety disorders: Secondary | ICD-10-CM | POA: Diagnosis not present

## 2018-10-22 DIAGNOSIS — Z6841 Body Mass Index (BMI) 40.0 and over, adult: Secondary | ICD-10-CM

## 2018-10-22 DIAGNOSIS — E66813 Obesity, class 3: Secondary | ICD-10-CM

## 2018-10-23 ENCOUNTER — Ambulatory Visit (INDEPENDENT_AMBULATORY_CARE_PROVIDER_SITE_OTHER): Payer: BC Managed Care – PPO | Admitting: Psychology

## 2018-10-23 DIAGNOSIS — F330 Major depressive disorder, recurrent, mild: Secondary | ICD-10-CM | POA: Diagnosis not present

## 2018-10-23 NOTE — Progress Notes (Signed)
Office: [phone]  /  Fax: [phone]   HPI:   Chief Complaint: OBESITY Lynn Simpson is here to discuss her progress with her obesity treatment plan. She is on the Category 3 plan and is following her eating plan approximately 85 % of the time. She states she is exercising 0 minutes 0 times per week. Lynn Simpson notes increased stress eating in the last week with increased job stress. She struggled with increasing lean protein, and she increased simple carbohydrates instead. Her weight is 265 lb (120.2 kg) today and has had a weight loss of 2 pounds over a period of 2 weeks since her last visit. She has lost 7 lbs since starting treatment with [place].  Depression with Anxiety Lynn Simpson has had increased stress with a change in her job. She also notes increased emotional eating, and she has gone back to weighing herself daily, and this increases stress. Lynn Simpson struggles with emotional eating and using food for comfort to the extent that it is negatively impacting her health. She often snacks when she is not hungry. Lynn Simpson sometimes feels she is out of control and then feels guilty that she made poor food choices. She has been working on behavior modification techniques to help reduce her emotional eating and has been somewhat successful. She shows no sign of suicidal or homicidal ideations.  ASSESSMENT AND PLAN:  Depression with anxiety  Class 3 severe obesity with serious comorbidity and body mass index (BMI) of 45.0 to 49.9 in adult, unspecified obesity type ([place])  PLAN:  Depression with Anxiety We discussed behavior modification techniques today to help Lynn Simpson deal with her emotional eating and depression. She will continue Zoloft and Xanax as needed and we will continue to monitor. Lynn Simpson agreed to follow up as directed.  I spent > than 50% of the 15 minute visit on counseling as documented in the note.  Obesity Lynn Simpson is currently in the action stage of change. As such, her goal is to continue with weight  loss efforts She has agreed to follow the Category 3 plan or follow the Pescatarian eating plan Lynn Simpson has been instructed to work up to a goal of 150 minutes of combined cardio and strengthening exercise per week for weight loss and overall health benefits. We discussed the following Behavioral Modification Strategies today: increasing lean protein intake, decreasing simple carbohydrates and emotional eating strategies  Lynn Simpson has agreed to follow up with our clinic in 2 weeks. She was informed of the importance of frequent follow up visits to maximize her success with intensive lifestyle modifications for her multiple health conditions.  ALLERGIES: Allergies  Allergen Reactions  . Bee Venom Anaphylaxis  . Wellbutrin [Bupropion] Anaphylaxis    MEDICATIONS: Current Outpatient Medications on File Prior to Visit  Medication Sig Dispense Refill  . albuterol (VENTOLIN HFA) 108 (90 Base) MCG/ACT inhaler Inhale into the lungs every 6 (six) hours as needed for wheezing or shortness of breath.    . clonazePAM (KLONOPIN) 0.5 MG tablet daily as needed.    Marland Kitchen esomeprazole (NEXIUM) 40 MG capsule Take 1 capsule (40 mg total) by mouth 2 (two) times daily. 60 capsule 5  . sertraline (ZOLOFT) 50 MG tablet Take 1 tablet by mouth daily.    . Vitamin D, Ergocalciferol, (DRISDOL) 1.25 MG (50000 UT) CAPS capsule Take 1 capsule (50,000 Units total) by mouth every 7 (seven) days. 4 capsule 0   No current facility-administered medications on file prior to visit.     PAST MEDICAL HISTORY: Past Medical History:  Diagnosis Date  . Allergy   . Anxiety   . Asthma   . Back pain   . Cancer ([place]) 2018   Left Breast  . Depression   . Dyspnea   . Family history of breast cancer   . Family history of melanoma   . Family history of ovarian cancer   . Gallstones   . GERD (gastroesophageal reflux disease)   . IBS (irritable bowel syndrome)   . Lower extremity edema   . Lynch syndrome   . Lynch syndrome   .  Migraine   . Obesity   . Ovarian cyst   . Vitamin D deficiency     PAST SURGICAL HISTORY: Past Surgical History:  Procedure Laterality Date  . BREAST RECONSTRUCTION WITH PLACEMENT OF TISSUE EXPANDER AND FLEX HD (ACELLULAR HYDRATED DERMIS) Bilateral 03/31/2016   Procedure: BILATERAL BREAST RECONSTRUCTION WITH PLACEMENT OF TISSUE EXPANDER AND FLEX HD (ACELLULAR HYDRATED DERMIS);  Surgeon: Wallace Going, DO;  Location: [facility];  Service: Plastics;  Laterality: Bilateral;  . CESAREAN SECTION  2005  . COLONOSCOPY    . LAPAROSCOPIC VAGINAL HYSTERECTOMY WITH SALPINGO OOPHORECTOMY Bilateral 09/15/2016   Procedure: LAPAROSCOPIC ASSISTED VAGINAL HYSTERECTOMY WITH BILATERAL SALPINGO OOPHORECTOMY;  Surgeon: Molli Posey, MD;  Location: [facility];  Service: Gynecology;  Laterality: Bilateral;  . LIPOSUCTION Bilateral 06/30/2016   Procedure: LIPOSUCTION TO LATERAL CHEST;  Surgeon: Wallace Going, DO;  Location: [facility];  Service: Plastics;  Laterality: Bilateral;  . NIPPLE SPARING MASTECTOMY Bilateral 03/31/2016   Procedure: BILATERAL NIPPLE SPARING MASTECTOMIES;  Surgeon: Autumn Messing III, MD;  Location: [facility];  Service: General;  Laterality: Bilateral;  . POLYPECTOMY    . REMOVAL OF BILATERAL TISSUE EXPANDERS WITH PLACEMENT OF BILATERAL BREAST IMPLANTS Bilateral 06/30/2016   Procedure: REMOVAL OF BILATERAL TISSUE EXPANDERS WITH PLACEMENT OF BILATERAL SILCONE BREAST IMPLANTS;  Surgeon: Wallace Going, DO;  Location: [facility];  Service: Plastics;  Laterality: Bilateral;  . WISDOM TOOTH EXTRACTION      SOCIAL HISTORY: Social History   Tobacco Use  . Smoking status: Former Smoker    Packs/day: 0.50    Years: 20.00    Pack years: 10.00    Types: Cigarettes    Quit date: 12/20/2015    Years since quitting: 2.8  . Smokeless tobacco: Never Used  . Tobacco comment: form given [id number]  Substance Use Topics  . Alcohol  use: Not Currently    Comment: occ  . Drug use: No    FAMILY HISTORY: Family History  Problem Relation Age of Onset  . Breast cancer Mother 63  . Liver cancer Mother   . Alcoholism Mother   . Lung cancer Father 12  . Breast cancer Sister 87  . Breast cancer Maternal Grandmother   . Ovarian cancer Maternal Grandmother   . Colon cancer Maternal Grandmother   . Cancer Paternal Grandfather        NOS  . Melanoma Maternal Aunt        mother's maternal half sister  . Colon polyps Neg Hx   . Rectal cancer Neg Hx   . Stomach cancer Neg Hx   . Esophageal cancer Neg Hx     ROS: Review of Systems  Constitutional: Positive for weight loss.  Psychiatric/Behavioral: Positive for depression. Negative for suicidal ideas. The patient is nervous/anxious.        Positive for Stress    PHYSICAL EXAM: Blood pressure 128/83, pulse 83, temperature 97.8  F (36.6 C), temperature source Oral, height 5\' 3"  (1.6 m), weight 265 lb (120.2 kg), last menstrual period 09/07/2016, SpO2 95 %. Body mass index is 46.94 kg/m. Physical Exam Vitals signs reviewed.  Constitutional:      Appearance: Normal appearance. She is well-developed. She is obese.  Cardiovascular:     Rate and Rhythm: Normal rate.  Pulmonary:     Effort: Pulmonary effort is normal.  Musculoskeletal: Normal range of motion.  Skin:    General: Skin is warm and dry.  Neurological:     Mental Status: She is alert and oriented to person, place, and time.  Psychiatric:        Mood and Affect: Mood normal.        Behavior: Behavior normal.        Thought Content: Thought content does not include homicidal or suicidal ideation.     RECENT LABS AND TESTS: BMET    Component Value Date/Time   NA 140 08/21/2018 0000   K 4.4 08/21/2018 0000   CL 103 08/21/2018 0000   CO2 23 08/21/2018 0000   GLUCOSE 93 08/21/2018 0000   GLUCOSE 85 09/17/2016 0542   BUN 11 08/21/2018 0000   CREATININE 0.67 08/21/2018 0000   CALCIUM 9.3  08/21/2018 0000   GFRNONAA 106 08/21/2018 0000   GFRAA 122 08/21/2018 0000   Lab Results  Component Value Date   HGBA1C 5.4 08/21/2018   Lab Results  Component Value Date   INSULIN 18.6 08/21/2018   CBC    Component Value Date/Time   WBC 7.9 08/21/2018 0000   WBC 14.3 (H) 09/17/2016 0513   RBC 5.00 08/21/2018 0000   RBC 3.96 09/17/2016 0513   HGB 15.1 08/21/2018 0000   HCT 46.0 08/21/2018 0000   PLT 134 (L) 09/17/2016 0513   MCV 92 08/21/2018 0000   MCH 30.2 08/21/2018 0000   MCH 30.1 09/17/2016 0513   MCHC 32.8 08/21/2018 0000   MCHC 33.4 09/17/2016 0513   RDW 12.5 08/21/2018 0000   LYMPHSABS 2.3 08/21/2018 0000   MONOABS 0.5 09/17/2016 0513   EOSABS 0.3 08/21/2018 0000   BASOSABS 0.1 08/21/2018 0000   Iron/TIBC/Ferritin/ %Sat No results found for: IRON, TIBC, FERRITIN, IRONPCTSAT Lipid Panel     Component Value Date/Time   CHOL 212 (H) 08/21/2018 0000   TRIG 79 08/21/2018 0000   HDL 59 08/21/2018 0000   LDLCALC 137 (H) 08/21/2018 0000   Hepatic Function Panel     Component Value Date/Time   PROT 6.6 08/21/2018 0000   ALBUMIN 4.5 08/21/2018 0000   AST 17 08/21/2018 0000   ALT 24 08/21/2018 0000   ALKPHOS 135 (H) 08/21/2018 0000   BILITOT 0.2 08/21/2018 0000   BILIDIR <0.1 09/12/2009 0345   IBILI NOT CALCULATED 09/12/2009 0345      Component Value Date/Time   TSH 3.220 08/21/2018 0000     Ref. Range 08/21/2018 00:00  Vitamin D, 25-Hydroxy Latest Ref Range: 30.0 - 100.0 ng/mL 11.7 (L)    OBESITY BEHAVIORAL INTERVENTION VISIT  Today's visit was # 5   Starting weight: 272 lbs Starting date: 08/21/2018 Today's weight : 265 lbs Today's date: 10/22/2018 Total lbs lost to date: 7    10/22/2018  Height 5\' 3"  (1.6 m)  Weight 265 lb (120.2 kg)  BMI (Calculated) 46.95  BLOOD PRESSURE - SYSTOLIC 542  BLOOD PRESSURE - DIASTOLIC 83   Body Fat % 70.6 %  Total Body Water (lbs) 90.6 lbs    ASK:  We discussed the diagnosis of obesity with Lynn Simpson today  and Lynn Simpson agreed to give [place] permission to discuss obesity behavioral modification therapy today.  ASSESS: Tanice has the diagnosis of obesity and her BMI today is 46.95 Berania is in the action stage of change   ADVISE: Kataleah was educated on the multiple health risks of obesity as well as the benefit of weight loss to improve her health. She was advised of the need for long term treatment and the importance of lifestyle modifications to improve her current health and to decrease her risk of future health problems.  AGREE: Multiple dietary modification options and treatment options were discussed and  Rocsi agreed to follow the recommendations documented in the above note.  ARRANGE: Mycah was educated on the importance of frequent visits to treat obesity as outlined per CMS and USPSTF guidelines and agreed to schedule her next follow up appointment today.  I, Doreene Nest, am acting as transcriptionist for Dennard Nip, MD I have reviewed the above documentation for accuracy and completeness, and I agree with the above. -Dennard Nip, MD

## 2018-11-13 ENCOUNTER — Other Ambulatory Visit: Payer: Self-pay

## 2018-11-13 ENCOUNTER — Ambulatory Visit (INDEPENDENT_AMBULATORY_CARE_PROVIDER_SITE_OTHER): Payer: BC Managed Care – PPO | Admitting: Psychology

## 2018-11-13 DIAGNOSIS — F330 Major depressive disorder, recurrent, mild: Secondary | ICD-10-CM

## 2018-11-13 NOTE — Progress Notes (Signed)
Office: [phone]  /  Fax: [phone]    Date: November 13, 2018    Appointment Start Time: 4:31pm Duration: 25 minutes Provider: Glennie Isle, Psy.D. Type of Session: Individual Therapy  Location of Patient: Home Location of Provider: [facility] Type of Contact: Telepsychological Visit via [employer] WebEx   Session Content: Lynn Simpson is a 48 y.o. female presenting via [place] for a follow-up appointment to address the previously established treatment goal of decreasing emotional eating. Today's appointment was a telepsychological visit, as this provider's clinic is seeing a limited number of patients for in-person visits due to COVID-19. Therapeutic services will resume to in-person appointments once deemed appropriate. Lynn Simpson expressed understanding regarding the rationale for telepsychological services, and provided verbal consent for today's appointment. Prior to proceeding with today's appointment, Lynn Simpson's physical location at the time of this appointment was obtained. Lynn Simpson reported she was at home and provided the address. In the event of technical difficulties, Lynn Simpson shared a phone number she could be reached at. Lynn Simpson and this provider participated in today's telepsychological service. Also, Lynn Simpson denied anyone else being present in the room or on the WebEx appointment.  This provider conducted a brief check-in and verbally administered the PHQ-9 and GAD-7. A risk assessment was completed. Lynn Simpson denied experiencing suicidal and homicidal ideation, plan, and intent since the last appointment with this provider. She reported she continues to have easy access to the developed safety plan, and continues to acknowledge understanding regarding the importance of reaching out to trusted individuals and/or emergency resources if she is unable to ensure safety.   Lynn Simpson shared she had a recent girls trip to the beach. She also indicated she enjoys being able to leave home and go into the  office. Additionally, Lynn Simpson discussed engaging in mindful breathing exercises. She noted mindfulness helped her cope when driving over bridges. This was positively reinforced. Lynn Simpson reflected her last panic attack was "months ago during April, May." Regarding eating, Lynn Simpson noted, "I didn't have any control while I was in [place]." She added, "I just didn't care." This was explored and it was reflected she made better choices and engaged in portion control. It was recommended she focus on protein intake on her next vacation. Lynn Simpson agreed. Moreover, psychoeducation regarding thought defusion, including its impact on emotional eating and overall well-being was provided. Lynn Simpson was led through a thought defusion exercise, and a handout with various exercises was provided. Lynn Simpson was encouraged to engage in the thought defusion exercises between now and the next appointment with this provider. Lynn Simpson agreed. She used the following thought for today's exercise: "I am uncapable." Her experience was processed. Lynn Simpson shared, "I can actually feel the anxiety." This was explored. She described it increased as the exercise progressed. Thus, she was led through additional thought defusion exercises. Following the exercises, she noted "It was almost funny to me."  Lynn Simpson provided verbal consent during today's appointment for this provider to send a handout with various thought defusion exercises via e-mail. Furthermore, since the onset of treatment, Lynn Simpson reported an overall reduction in emotional eating. Thus, termination planning was discussed and the frequency of future appointments will be reduced.  Lynn Simpson was receptive to today's session as evidenced by openness to sharing, responsiveness to feedback, and willingness to engage in thought defusion exercises.   Mental Status Examination:  Appearance: neat Behavior: cooperative Mood: euthymic Affect: mood congruent Speech: normal in rate, volume, and tone Eye Contact: appropriate  Psychomotor Activity: appropriate Thought Process: linear, logical, and  goal directed  Content/Perceptual Disturbances: denies suicidal and homicidal ideation, plan, and intent and no hallucinations, delusions, bizarre thinking or behavior reported or observed Orientation: time, person, place and purpose of appointment Cognition/Sensorium: memory, attention, language, and fund of knowledge intact  Insight: good Judgment: good  Structured Assessment Results: The Patient Health Questionnaire-9 (PHQ-9) is a self-report measure that assesses symptoms and severity of depression over the course of the last two weeks. Lynn Simpson obtained a score of 0. Little interest or pleasure in doing things 0  Feeling down, depressed, or hopeless 0  Trouble falling or staying asleep, or sleeping too much 0  Feeling tired or having little energy 0  Poor appetite or overeating 0  Feeling bad about yourself --- or that you are a failure or have let yourself or your family down 0  Trouble concentrating on things, such as reading the newspaper or watching television 0  Moving or speaking so slowly that other people could have noticed? Or the opposite --- being so fidgety or restless that you have been moving around a lot more than usual 0  Thoughts that you would be better off dead or hurting yourself in some way 0  PHQ-9 Score 0    The Generalized Anxiety Disorder-7 (GAD-7) is a brief self-report measure that assesses symptoms of anxiety over the course of the last two weeks. Lynn Simpson obtained a score of 0. Feeling nervous, anxious, on edge 0  Not being able to stop or control worrying 0  Worrying too much about different things 0  Trouble relaxing 0  Being so restless that it's hard to sit still 0  Becoming easily annoyed or irritable 0  Feeling afraid as if something awful might happen 0  GAD-7 Score 0   Interventions:  Conducted a brief chart review Verbal administration of PHQ-9 and GAD-7 for symptom monitoring  Provided empathic reflections and validation Reviewed content from the previous session Psychoeducation provided regarding thought defusion Engaged patient in a thought defusion exercise Discussed termination planning Provided positive reinforcement Employed supportive psychotherapy interventions to facilitate reduced distress, and to improve coping skills with identified stressors Employed acceptance and commitment interventions to emphasize mindfulness and acceptance without struggle  DSM-5 Diagnosis: 296.31 (F33.0) Major Depressive Disorder, Recurrent Episode, Mild, With Anxious Distress  Treatment Goal & Progress: During the initial appointment with this provider, the following treatment goal was established: decrease emotional eating. Miyani has demonstrated progress in her goal as evidenced by increased awareness of hunger patterns and triggers for emotional eating. She continues to demonstrate willingness to engage in learned skills and reported a reduction in emotional eating.   Plan: Lynn Simpson continues to appear able and willing to participate as evidenced by engagement in reciprocal conversation, and asking questions for clarification as appropriate. The next appointment will be scheduled in one month, which will be via [employer]. The next session will focus further on thought defusion.

## 2018-11-14 ENCOUNTER — Telehealth (INDEPENDENT_AMBULATORY_CARE_PROVIDER_SITE_OTHER): Payer: BC Managed Care – PPO | Admitting: Family Medicine

## 2018-11-14 ENCOUNTER — Encounter (INDEPENDENT_AMBULATORY_CARE_PROVIDER_SITE_OTHER): Payer: Self-pay | Admitting: Family Medicine

## 2018-11-14 DIAGNOSIS — E559 Vitamin D deficiency, unspecified: Secondary | ICD-10-CM

## 2018-11-14 DIAGNOSIS — Z6841 Body Mass Index (BMI) 40.0 and over, adult: Secondary | ICD-10-CM | POA: Diagnosis not present

## 2018-11-14 MED ORDER — VITAMIN D (ERGOCALCIFEROL) 1.25 MG (50000 UNIT) PO CAPS: 50000.0000 [IU] | ORAL_CAPSULE | ORAL | 0 refills | Status: DC

## 2018-11-15 NOTE — Progress Notes (Signed)
Office: [phone]  /  Fax: [phone] TeleHealth Visit:  [name] has verbally consented to this TeleHealth visit today. The patient is located at home, the provider is located at the [facility] office. The participants in this visit include the listed provider and patient. The visit was conducted today via [email or web address].  HPI:   Chief Complaint: OBESITY Lynn Simpson is here to discuss her progress with her obesity treatment plan. She is on the Category 3 plan or follow the Pescatarian eating plan and is following her eating plan approximately 70 % of the time. She states she is exercising 0 minutes 0 times per week. Lynn Simpson has done well maintaining her weight. She has increased celebration eating and eating out, but has made some better choices. So she has not gained weight. She will be traveling this weekend but then will try to get back on track.  We were unable to weigh the patient today for this TeleHealth visit. She feels as if she has maintained her weight since her last visit. She has lost 7 lbs since starting treatment with [place].  Vitamin D Deficiency Lynn Simpson has a diagnosis of vitamin D deficiency. She is stable on prescription Vit D, but level is not yet at goal. She denies nausea, vomiting or muscle weakness.  ASSESSMENT AND PLAN:  Class 3 severe obesity with serious comorbidity and body mass index (BMI) of 45.0 to 49.9 in adult, unspecified obesity type (HCC)  Vitamin D deficiency - Plan: Vitamin D, Ergocalciferol, (DRISDOL) 1.25 MG (50000 UT) CAPS capsule  PLAN:  Vitamin D Deficiency Lynn Simpson was informed that low vitamin D levels contributes to fatigue and are associated with obesity, breast, and colon cancer. Lynn Simpson agrees to continue taking prescription Vit D 50,000 IU every week #4 and we will refill for 1 month. She will follow up for routine testing of vitamin D, at least 2-3 times per year. She was informed of the risk of over-replacement of vitamin D and agrees to not  increase her dose unless she discusses this with [place] first. Lynn Simpson agrees to follow up with our clinic in 3 weeks.  Obesity Lynn Simpson is currently in the action stage of change. As such, her goal is to continue with weight loss efforts She has agreed to follow the Category 3 plan Lynn Simpson has been instructed to work up to a goal of 150 minutes of combined cardio and strengthening exercise per week for weight loss and overall health benefits. We discussed the following Behavioral Modification Strategies today: travel eating strategies    Lynn Simpson has agreed to follow up with our clinic in 3 weeks. She was informed of the importance of frequent follow up visits to maximize her success with intensive lifestyle modifications for her multiple health conditions.  ALLERGIES: Allergies  Allergen Reactions  . Bee Venom Anaphylaxis  . Wellbutrin [Bupropion] Anaphylaxis    MEDICATIONS: Current Outpatient Medications on File Prior to Visit  Medication Sig Dispense Refill  . albuterol (VENTOLIN HFA) 108 (90 Base) MCG/ACT inhaler Inhale into the lungs every 6 (six) hours as needed for wheezing or shortness of breath.    . clonazePAM (KLONOPIN) 0.5 MG tablet daily as needed.    Marland Kitchen esomeprazole (NEXIUM) 40 MG capsule Take 1 capsule (40 mg total) by mouth 2 (two) times daily. 60 capsule 5  . sertraline (ZOLOFT) 50 MG tablet Take 1 tablet by mouth daily.     No current facility-administered medications on file prior to visit.     PAST  MEDICAL HISTORY: Past Medical History:  Diagnosis Date  . Allergy   . Anxiety   . Asthma   . Back pain   . Cancer ([place]) 2018   Left Breast  . Depression   . Dyspnea   . Family history of breast cancer   . Family history of melanoma   . Family history of ovarian cancer   . Gallstones   . GERD (gastroesophageal reflux disease)   . IBS (irritable bowel syndrome)   . Lower extremity edema   . Lynch syndrome   . Lynch syndrome   . Migraine   . Obesity   . Ovarian cyst   .  Vitamin D deficiency     PAST SURGICAL HISTORY: Past Surgical History:  Procedure Laterality Date  . BREAST RECONSTRUCTION WITH PLACEMENT OF TISSUE EXPANDER AND FLEX HD (ACELLULAR HYDRATED DERMIS) Bilateral 03/31/2016   Procedure: BILATERAL BREAST RECONSTRUCTION WITH PLACEMENT OF TISSUE EXPANDER AND FLEX HD (ACELLULAR HYDRATED DERMIS);  Surgeon: Wallace Going, DO;  Location: [place];  Service: Plastics;  Laterality: Bilateral;  . CESAREAN SECTION  2005  . COLONOSCOPY    . LAPAROSCOPIC VAGINAL HYSTERECTOMY WITH SALPINGO OOPHORECTOMY Bilateral 09/15/2016   Procedure: LAPAROSCOPIC ASSISTED VAGINAL HYSTERECTOMY WITH BILATERAL SALPINGO OOPHORECTOMY;  Surgeon: Molli Posey, MD;  Location: [facility];  Service: Gynecology;  Laterality: Bilateral;  . LIPOSUCTION Bilateral 06/30/2016   Procedure: LIPOSUCTION TO LATERAL CHEST;  Surgeon: Wallace Going, DO;  Location: [place];  Service: Plastics;  Laterality: Bilateral;  . NIPPLE SPARING MASTECTOMY Bilateral 03/31/2016   Procedure: BILATERAL NIPPLE SPARING MASTECTOMIES;  Surgeon: Autumn Messing III, MD;  Location: [place];  Service: General;  Laterality: Bilateral;  . POLYPECTOMY    . REMOVAL OF BILATERAL TISSUE EXPANDERS WITH PLACEMENT OF BILATERAL BREAST IMPLANTS Bilateral 06/30/2016   Procedure: REMOVAL OF BILATERAL TISSUE EXPANDERS WITH PLACEMENT OF BILATERAL SILCONE BREAST IMPLANTS;  Surgeon: Wallace Going, DO;  Location: [place];  Service: Plastics;  Laterality: Bilateral;  . WISDOM TOOTH EXTRACTION      SOCIAL HISTORY: Social History   Tobacco Use  . Smoking status: Former Smoker    Packs/day: 0.50    Years: 20.00    Pack years: 10.00    Types: Cigarettes    Quit date: 12/20/2015    Years since quitting: 2.9  . Smokeless tobacco: Never Used  . Tobacco comment: form given 02-6016  Substance Use Topics  . Alcohol use: Not Currently    Comment: occ  . Drug  use: No    FAMILY HISTORY: Family History  Problem Relation Age of Onset  . Breast cancer Mother 37  . Liver cancer Mother   . Alcoholism Mother   . Lung cancer Father 12  . Breast cancer Sister 30  . Breast cancer Maternal Grandmother   . Ovarian cancer Maternal Grandmother   . Colon cancer Maternal Grandmother   . Cancer Paternal Grandfather        NOS  . Melanoma Maternal Aunt        mother's maternal half sister  . Colon polyps Neg Hx   . Rectal cancer Neg Hx   . Stomach cancer Neg Hx   . Esophageal cancer Neg Hx     ROS: Review of Systems  Constitutional: Negative for weight loss.  Gastrointestinal: Negative for nausea and vomiting.  Musculoskeletal:       Negative muscle weakness    PHYSICAL EXAM: Pt in no acute distress  RECENT LABS AND TESTS: BMET    Component Value Date/Time   NA 140 08/21/2018 0000   K 4.4 08/21/2018 0000   CL 103 08/21/2018 0000   CO2 23 08/21/2018 0000   GLUCOSE 93 08/21/2018 0000   GLUCOSE 85 09/17/2016 0542   BUN 11 08/21/2018 0000   CREATININE 0.67 08/21/2018 0000   CALCIUM 9.3 08/21/2018 0000   GFRNONAA 106 08/21/2018 0000   GFRAA 122 08/21/2018 0000   Lab Results  Component Value Date   HGBA1C 5.4 08/21/2018   Lab Results  Component Value Date   INSULIN 18.6 08/21/2018   CBC    Component Value Date/Time   WBC 7.9 08/21/2018 0000   WBC 14.3 (H) 09/17/2016 0513   RBC 5.00 08/21/2018 0000   RBC 3.96 09/17/2016 0513   HGB 15.1 08/21/2018 0000   HCT 46.0 08/21/2018 0000   PLT 134 (L) 09/17/2016 0513   MCV 92 08/21/2018 0000   MCH 30.2 08/21/2018 0000   MCH 30.1 09/17/2016 0513   MCHC 32.8 08/21/2018 0000   MCHC 33.4 09/17/2016 0513   RDW 12.5 08/21/2018 0000   LYMPHSABS 2.3 08/21/2018 0000   MONOABS 0.5 09/17/2016 0513   EOSABS 0.3 08/21/2018 0000   BASOSABS 0.1 08/21/2018 0000   Iron/TIBC/Ferritin/ %Sat No results found for: IRON, TIBC, FERRITIN, IRONPCTSAT Lipid Panel     Component Value Date/Time    CHOL 212 (H) 08/21/2018 0000   TRIG 79 08/21/2018 0000   HDL 59 08/21/2018 0000   LDLCALC 137 (H) 08/21/2018 0000   Hepatic Function Panel     Component Value Date/Time   PROT 6.6 08/21/2018 0000   ALBUMIN 4.5 08/21/2018 0000   AST 17 08/21/2018 0000   ALT 24 08/21/2018 0000   ALKPHOS 135 (H) 08/21/2018 0000   BILITOT 0.2 08/21/2018 0000   BILIDIR <0.1 09/12/2009 0345   IBILI NOT CALCULATED 09/12/2009 0345      Component Value Date/Time   TSH 3.220 08/21/2018 0000      I, Trixie Dredge, am acting as transcriptionist for Dennard Nip, MD I have reviewed the above documentation for accuracy and completeness, and I agree with the above. -Dennard Nip, MD

## 2018-11-27 DIAGNOSIS — Z0000 Encounter for general adult medical examination without abnormal findings: Secondary | ICD-10-CM | POA: Diagnosis not present

## 2018-11-28 ENCOUNTER — Other Ambulatory Visit: Payer: Self-pay

## 2018-11-28 ENCOUNTER — Telehealth (INDEPENDENT_AMBULATORY_CARE_PROVIDER_SITE_OTHER): Payer: BC Managed Care – PPO | Admitting: Family Medicine

## 2018-11-28 ENCOUNTER — Encounter (INDEPENDENT_AMBULATORY_CARE_PROVIDER_SITE_OTHER): Payer: Self-pay | Admitting: Family Medicine

## 2018-11-28 DIAGNOSIS — E559 Vitamin D deficiency, unspecified: Secondary | ICD-10-CM | POA: Diagnosis not present

## 2018-11-28 DIAGNOSIS — Z6841 Body Mass Index (BMI) 40.0 and over, adult: Secondary | ICD-10-CM | POA: Diagnosis not present

## 2018-11-28 DIAGNOSIS — F5101 Primary insomnia: Secondary | ICD-10-CM | POA: Diagnosis not present

## 2018-11-28 MED ORDER — DIPHENHYDRAMINE HCL 50 MG PO TABS: 50.0000 mg | ORAL_TABLET | Freq: Every evening | ORAL | 0 refills | Status: DC | PRN

## 2018-11-28 MED ORDER — VITAMIN D (ERGOCALCIFEROL) 1.25 MG (50000 UNIT) PO CAPS: 50000.0000 [IU] | ORAL_CAPSULE | ORAL | 0 refills | Status: DC

## 2018-11-29 NOTE — Progress Notes (Signed)
Office: [phone]  /  Fax: [phone] TeleHealth Visit:  Lynn Simpson has verbally consented to this TeleHealth visit today. The patient is located at work, the provider is located at the [facility] office. The participants in this visit include the listed provider and patient. The visit was conducted today via Doxy.  HPI:   Chief Complaint: OBESITY Lynn Simpson is here to discuss her progress with her obesity treatment plan. She is on the Category 3 plan and is following her eating plan approximately 80% of the time. She states she is exercising 0 minutes 0 times per week. Lynn Simpson feels she has maintained her weight. She has not weighed but her clothes fit the same. She is frustrated her weight loss has stalled. After questioning, she is getting very little sleep which may be decreasing her RMR. We were unable to weigh the patient today for this TeleHealth visit. She feels as if she has maintained her weight since her last visit. She has lost 7 lbs since starting treatment with [place].  Vitamin D deficiency Lynn Simpson has a diagnosis of Vitamin D deficiency, which is not yet at goal. She is currently stable on prescription Vit D and denies nausea, vomiting or muscle weakness.  Insomnia, Primary Lynn Simpson states she goes to bed around 2-3 a.m. and gets up around 5-6 a.m. She cannot fall asleep sooner and cannot go back to sleep when she wakes up. She tried Tylenol PM recently with no improvement.  ASSESSMENT AND PLAN:  Vitamin D deficiency - Plan: Vitamin D, Ergocalciferol, (DRISDOL) 1.25 MG (50000 UT) CAPS capsule  Primary insomnia - Plan: diphenhydrAMINE (BENADRYL) 50 MG tablet  Class 3 severe obesity with serious comorbidity and body mass index (BMI) of 45.0 to 49.9 in adult, unspecified obesity type (Lynn Simpson)  PLAN:  Vitamin D Deficiency [place] was informed that low Vitamin D levels contributes to fatigue and are associated with obesity, breast, and colon cancer. She agrees to continue to  take prescription Vit D @ 50,000 IU every week #4 with 0 refills and will follow-up for routine testing of Vitamin D at her next in-office visit in 2 weeks. She was informed of the risk of over-replacement of Vitamin D and agrees to not increase her dose unless she discusses this with [place] first. Lynn Simpson agrees to follow-up with our clinic in 2 weeks.  Insomnia, Primary Lynn Simpson was instructed to increase Benadryl OTC to 50 mg QHS 1 hour before bedtime and try to move bedtime to 1 a.m. to start.  Obesity Lynn Simpson is currently in the action stage of change. As such, her goal is to continue with weight loss efforts. She has agreed to follow the Category 3 plan. Lynn Simpson will work on insomnia first and see her back in the office. We may need to recheck RMR at that time. Lynn Simpson has been instructed to work up to a goal of 150 minutes of combined cardio and strengthening exercise per week for weight loss and overall health benefits. We discussed the following Behavioral Modification Strategies today: work on meal planning and easy cooking plans.  Lynn Simpson has agreed to follow-up with our clinic in 2 weeks. She was informed of the importance of frequent follow-up visits to maximize her success with intensive lifestyle modifications for her multiple health conditions.  ALLERGIES: Allergies  Allergen Reactions  . Bee Venom Anaphylaxis  . Wellbutrin [Bupropion] Anaphylaxis    MEDICATIONS: Current Outpatient Medications on File Prior to Visit  Medication Sig Dispense Refill  . albuterol (VENTOLIN  HFA) 108 (90 Base) MCG/ACT inhaler Inhale into the lungs every 6 (six) hours as needed for wheezing or shortness of breath.    . clonazePAM (KLONOPIN) 0.5 MG tablet daily as needed.    Marland Kitchen esomeprazole (NEXIUM) 40 MG capsule Take 1 capsule (40 mg total) by mouth 2 (two) times daily. 60 capsule 5  . sertraline (ZOLOFT) 50 MG tablet Take 1 tablet by mouth daily.     No current facility-administered medications on file prior to  visit.     PAST MEDICAL HISTORY: Past Medical History:  Diagnosis Date  . Allergy   . Anxiety   . Asthma   . Back pain   . Cancer ([place]) 2018   Left Breast  . Depression   . Dyspnea   . Family history of breast cancer   . Family history of melanoma   . Family history of ovarian cancer   . Gallstones   . GERD (gastroesophageal reflux disease)   . IBS (irritable bowel syndrome)   . Lower extremity edema   . Lynch syndrome   . Lynch syndrome   . Migraine   . Obesity   . Ovarian cyst   . Vitamin D deficiency     PAST SURGICAL HISTORY: Past Surgical History:  Procedure Laterality Date  . BREAST RECONSTRUCTION WITH PLACEMENT OF TISSUE EXPANDER AND FLEX HD (ACELLULAR HYDRATED DERMIS) Bilateral 03/31/2016   Procedure: BILATERAL BREAST RECONSTRUCTION WITH PLACEMENT OF TISSUE EXPANDER AND FLEX HD (ACELLULAR HYDRATED DERMIS);  Surgeon: Wallace Going, DO;  Location: [place];  Service: Plastics;  Laterality: Bilateral;  . CESAREAN SECTION  2005  . COLONOSCOPY    . LAPAROSCOPIC VAGINAL HYSTERECTOMY WITH SALPINGO OOPHORECTOMY Bilateral 09/15/2016   Procedure: LAPAROSCOPIC ASSISTED VAGINAL HYSTERECTOMY WITH BILATERAL SALPINGO OOPHORECTOMY;  Surgeon: Molli Posey, MD;  Location: [facility];  Service: Gynecology;  Laterality: Bilateral;  . LIPOSUCTION Bilateral 06/30/2016   Procedure: LIPOSUCTION TO LATERAL CHEST;  Surgeon: Wallace Going, DO;  Location: [place];  Service: Plastics;  Laterality: Bilateral;  . NIPPLE SPARING MASTECTOMY Bilateral 03/31/2016   Procedure: BILATERAL NIPPLE SPARING MASTECTOMIES;  Surgeon: Autumn Messing III, MD;  Location: [place];  Service: General;  Laterality: Bilateral;  . POLYPECTOMY    . REMOVAL OF BILATERAL TISSUE EXPANDERS WITH PLACEMENT OF BILATERAL BREAST IMPLANTS Bilateral 06/30/2016   Procedure: REMOVAL OF BILATERAL TISSUE EXPANDERS WITH PLACEMENT OF BILATERAL SILCONE BREAST IMPLANTS;  Surgeon:  Wallace Going, DO;  Location: [place];  Service: Plastics;  Laterality: Bilateral;  . WISDOM TOOTH EXTRACTION      SOCIAL HISTORY: Social History   Tobacco Use  . Smoking status: Former Smoker    Packs/day: 0.50    Years: 20.00    Pack years: 10.00    Types: Cigarettes    Quit date: 12/20/2015    Years since quitting: 2.9  . Smokeless tobacco: Never Used  . Tobacco comment: form given 02-6016  Substance Use Topics  . Alcohol use: Not Currently    Comment: occ  . Drug use: No    FAMILY HISTORY: Family History  Problem Relation Age of Onset  . Breast cancer Mother 28  . Liver cancer Mother   . Alcoholism Mother   . Lung cancer Father 65  . Breast cancer Sister 19  . Breast cancer Maternal Grandmother   . Ovarian cancer Maternal Grandmother   . Colon cancer Maternal Grandmother   . Cancer Paternal Grandfather  NOS  . Melanoma Maternal Aunt        mother's maternal half sister  . Colon polyps Neg Hx   . Rectal cancer Neg Hx   . Stomach cancer Neg Hx   . Esophageal cancer Neg Hx    ROS: Review of Systems  Gastrointestinal: Negative for nausea and vomiting.  Musculoskeletal:       Negative for muscle weakness.  Psychiatric/Behavioral: The patient has insomnia.    PHYSICAL EXAM: Pt in no acute distress  RECENT LABS AND TESTS: BMET    Component Value Date/Time   NA 140 08/21/2018 0000   K 4.4 08/21/2018 0000   CL 103 08/21/2018 0000   CO2 23 08/21/2018 0000   GLUCOSE 93 08/21/2018 0000   GLUCOSE 85 09/17/2016 0542   BUN 11 08/21/2018 0000   CREATININE 0.67 08/21/2018 0000   CALCIUM 9.3 08/21/2018 0000   GFRNONAA 106 08/21/2018 0000   GFRAA 122 08/21/2018 0000   Lab Results  Component Value Date   HGBA1C 5.4 08/21/2018   Lab Results  Component Value Date   INSULIN 18.6 08/21/2018   CBC    Component Value Date/Time   WBC 7.9 08/21/2018 0000   WBC 14.3 (H) 09/17/2016 0513   RBC 5.00 08/21/2018 0000   RBC 3.96  09/17/2016 0513   HGB 15.1 08/21/2018 0000   HCT 46.0 08/21/2018 0000   PLT 134 (L) 09/17/2016 0513   MCV 92 08/21/2018 0000   MCH 30.2 08/21/2018 0000   MCH 30.1 09/17/2016 0513   MCHC 32.8 08/21/2018 0000   MCHC 33.4 09/17/2016 0513   RDW 12.5 08/21/2018 0000   LYMPHSABS 2.3 08/21/2018 0000   MONOABS 0.5 09/17/2016 0513   EOSABS 0.3 08/21/2018 0000   BASOSABS 0.1 08/21/2018 0000   Iron/TIBC/Ferritin/ %Sat No results found for: IRON, TIBC, FERRITIN, IRONPCTSAT Lipid Panel     Component Value Date/Time   CHOL 212 (H) 08/21/2018 0000   TRIG 79 08/21/2018 0000   HDL 59 08/21/2018 0000   LDLCALC 137 (H) 08/21/2018 0000   Hepatic Function Panel     Component Value Date/Time   PROT 6.6 08/21/2018 0000   ALBUMIN 4.5 08/21/2018 0000   AST 17 08/21/2018 0000   ALT 24 08/21/2018 0000   ALKPHOS 135 (H) 08/21/2018 0000   BILITOT 0.2 08/21/2018 0000   BILIDIR <0.1 09/12/2009 0345   IBILI NOT CALCULATED 09/12/2009 0345      Component Value Date/Time   TSH 3.220 08/21/2018 0000   Results for ANIJA, REINDEL (MRN [id number]) as of 11/29/2018 09:18  Ref. Range 08/21/2018 00:00  Vitamin D, 25-Hydroxy Latest Ref Range: 30.0 - 100.0 ng/mL 11.7 (L)   I, Michaelene Song, am acting as Location manager for Dennard Nip, MD I have reviewed the above documentation for accuracy and completeness, and I agree with the above. -Dennard Nip, MD

## 2018-12-03 NOTE — Progress Notes (Signed)
Office: [phone]  /  Fax: [phone]    Date: December 11, 2018   Appointment Start Time: 11:31am Duration: 27 minutes Provider: Gaytri Barker, Psy.D. Type of Session: Individual Therapy  Location of Patient: Brother's home Location of Provider: Provider's Home Type of Contact: Telepsychological Visit via [employer] Web[employer]   Session Content: Lynn Simpson is a 48 y.o. female presenting via [employer] Web[employer] for a follow-up appointment to address the previously established treatment goal of decreasing emotional eating. Today's appointment was a telepsychological visit, as this provider's clinic is seeing a limited number of patients for in-person visits due to COVID-19. Therapeutic services will resume to in-person appointments once deemed appropriate. Lynn Simpson expressed understanding regarding the rationale for telepsychological services, and provided verbal consent for today's appointment. Prior to proceeding with today's appointment, Lynn Simpson physical location at the time of this appointment was obtained. Lynn Simpson reported she was at her brother's home and provided the address. In the event of technical difficulties, Lynn Simpson shared a phone number she could be reached at. Lynn Simpson and this provider participated in today's telepsychological service. Also, Lynn Simpson denied anyone else being present outside with her or on the WebEx appointment.  This provider conducted a brief check-in and verbally administered the PHQ-9 and GAD-7. A risk assessment was completed. Lynn Simpson denied experiencing suicidal and homicidal ideation, plan, and intent since the last appointment with this provider. She reported she continues to have easy access to the developed safety plan, and continues to acknowledge understanding regarding the importance of reaching out to trusted individuals and/or emergency resources if she is unable to ensure safety.  Lynn Simpson shared she has "a new baby niece." She added, "I think I'm doing good." However, Lynn Simpson shared two weeks  ago was a "very rough week." She explained she had a panic attack and was prescribed "more Klonopin" by her PCP during her physical exam. She discussed engaging in mindful breathing to assist with coping. This was positively reinforced. Lynn Simpson further explained there are significant changes at work leading to concern about job security. During her physical exam, Lynn Simpson was referred to the psychologist from her PCP's office ([facility]). She stated she has had one visit with the psychologist, which was last Friday. Their next appointment is "the second week of October." Lynn Simpson noted the psychologist is aware of Lynn Simpson meeting with this provider to address emotional eating. Lynn Simpson expressed desire to continue treatment with the new psychologist and noted a desire to "explore deeper" and manage symptoms of anxiety. Regarding eating, Lynn Simpson discussed deviations from the meal plan while at her brother's home; however, she discussed making better choices and engages in portions control. Lynn Simpson denied episodes of emotional eating since the last appointment with this provider. A plan was developed to help Lynn Simpson cope with emotional hunger secondary to stress and unpleasant emotions using learned skills. She wrote down the following plan: focus on hydration, be prepared with snacks congruent to the meal plan, pause to ask questions when triggered to eat (e.g., Am I really hungry?; Is there something bothering me? Will I feel better if I eat?), and engage in coping skills after going through the aforementioned questions. Overall, Lynn Simpson was receptive to today's session as evidenced by openness to sharing, responsiveness to feedback, and willingness to continue engaging in learned skills.  Mental Status Examination:  Appearance: neat Behavior: cooperative Mood: euthymic Affect: mood congruent Speech: normal in rate, volume, and tone Eye Contact: appropriate Psychomotor Activity: appropriate Thought Process: linear, logical, and  goal directed  Content/Perceptual Disturbances: denies suicidal   and homicidal ideation, plan, and intent and no hallucinations, delusions, bizarre thinking or behavior reported or observed Orientation: time, person, place and purpose of appointment Cognition/Sensorium: memory, attention, language, and fund of knowledge intact  Insight: good Judgment: good  Structured Assessment Results: The Patient Health Questionnaire-9 (PHQ-9) is a self-report measure that assesses symptoms and severity of depression over the course of the last two weeks. Lynn Simpson obtained a score of 0. Little interest or pleasure in doing things 0  Feeling down, depressed, or hopeless 0  Trouble falling or staying asleep, or sleeping too much 0  Feeling tired or having little energy 0  Poor appetite or overeating 0  Feeling bad about yourself --- or that you are a failure or have let yourself or your family down 0  Trouble concentrating on things, such as reading the newspaper or watching television 0  Moving or speaking so slowly that other people could have noticed? Or the opposite --- being so fidgety or restless that you have been moving around a lot more than usual 0  Thoughts that you would be better off dead or hurting yourself in some way 0  PHQ-9 Score 0    The Generalized Anxiety Disorder-7 (GAD-7) is a brief self-report measure that assesses symptoms of anxiety over the course of the last two weeks. Lynn Simpson obtained a score of 2 suggesting minimal anxiety. Lynn Simpson finds the endorsed symptoms to be not difficult at all. Feeling nervous, anxious, on edge 1  Not being able to stop or control worrying 0  Worrying too much about different things 1  Trouble relaxing 0  Being so restless that it's hard to sit still 0  Becoming easily annoyed or irritable 0  Feeling afraid as if something awful might happen 0  GAD-7 Score 2   Interventions:  Conducted a brief chart review Verbal administration of PHQ-9 and GAD-7 for  symptom monitoring Provided empathic reflections and validation Conducted a risk assessment Reviewed learned skills Provided positive reinforcement Employed supportive psychotherapy interventions to facilitate reduced distress, and to improve coping skills with identified stressors  DSM-5 Diagnosis: 296.31 (F33.0) Major Depressive Disorder, Recurrent Episode, Mild, With Anxious Distress  Treatment Goal & Progress: During the initial appointment with this provider, the following treatment goal was established: decrease emotional eating. Lynn Simpson demonstrated progress in her goal as evidenced by increased awareness of hunger patterns and triggers for emotional eating. Lynn Simpson also reported a reduction in emotional eating and continues to demonstrate willingness to engage in learned skill(s).  Plan: Lynn Simpson declined future appointments with this provider as she established care with a psychologist from her PCP's office and she has met her established treatment goal with this provider. Her next appointment with the new psychologist is scheduled during the second week of October.  She acknowledged understanding that she may request a follow-up appointment with this provider in the future as long as she is still established with the clinic. No further follow-up planned by this provider.    

## 2018-12-04 DIAGNOSIS — Z1331 Encounter for screening for depression: Secondary | ICD-10-CM | POA: Diagnosis not present

## 2018-12-04 DIAGNOSIS — Z0000 Encounter for general adult medical examination without abnormal findings: Secondary | ICD-10-CM | POA: Diagnosis not present

## 2018-12-04 DIAGNOSIS — Z8481 Family history of carrier of genetic disease: Secondary | ICD-10-CM | POA: Diagnosis not present

## 2018-12-04 DIAGNOSIS — R030 Elevated blood-pressure reading, without diagnosis of hypertension: Secondary | ICD-10-CM | POA: Diagnosis not present

## 2018-12-04 DIAGNOSIS — D126 Benign neoplasm of colon, unspecified: Secondary | ICD-10-CM | POA: Diagnosis not present

## 2018-12-04 DIAGNOSIS — E7800 Pure hypercholesterolemia, unspecified: Secondary | ICD-10-CM | POA: Diagnosis not present

## 2018-12-07 DIAGNOSIS — F4323 Adjustment disorder with mixed anxiety and depressed mood: Secondary | ICD-10-CM | POA: Diagnosis not present

## 2018-12-11 ENCOUNTER — Other Ambulatory Visit: Payer: Self-pay

## 2018-12-11 ENCOUNTER — Ambulatory Visit (INDEPENDENT_AMBULATORY_CARE_PROVIDER_SITE_OTHER): Payer: BC Managed Care – PPO | Admitting: Psychology

## 2018-12-11 DIAGNOSIS — F330 Major depressive disorder, recurrent, mild: Secondary | ICD-10-CM | POA: Diagnosis not present

## 2018-12-17 ENCOUNTER — Ambulatory Visit (INDEPENDENT_AMBULATORY_CARE_PROVIDER_SITE_OTHER): Payer: BC Managed Care – PPO | Admitting: Family Medicine

## 2018-12-20 HISTORY — PX: ANKLE FRACTURE SURGERY: SHX122

## 2018-12-22 ENCOUNTER — Encounter (HOSPITAL_BASED_OUTPATIENT_CLINIC_OR_DEPARTMENT_OTHER): Payer: Self-pay | Admitting: *Deleted

## 2018-12-22 ENCOUNTER — Emergency Department (HOSPITAL_BASED_OUTPATIENT_CLINIC_OR_DEPARTMENT_OTHER): Payer: BC Managed Care – PPO

## 2018-12-22 ENCOUNTER — Emergency Department (HOSPITAL_BASED_OUTPATIENT_CLINIC_OR_DEPARTMENT_OTHER)
Admission: EM | Admit: 2018-12-22 | Discharge: 2018-12-22 | Disposition: A | Discharge status: Home / Self Care | Payer: BC Managed Care – PPO | Attending: Emergency Medicine | Admitting: Emergency Medicine

## 2018-12-22 DIAGNOSIS — S8251XA Displaced fracture of medial malleolus of right tibia, initial encounter for closed fracture: Secondary | ICD-10-CM | POA: Diagnosis not present

## 2018-12-22 DIAGNOSIS — Y939 Activity, unspecified: Secondary | ICD-10-CM | POA: Insufficient documentation

## 2018-12-22 DIAGNOSIS — Y999 Unspecified external cause status: Secondary | ICD-10-CM | POA: Diagnosis not present

## 2018-12-22 DIAGNOSIS — Z87891 Personal history of nicotine dependence: Secondary | ICD-10-CM | POA: Insufficient documentation

## 2018-12-22 DIAGNOSIS — J45909 Unspecified asthma, uncomplicated: Secondary | ICD-10-CM | POA: Diagnosis not present

## 2018-12-22 DIAGNOSIS — Y929 Unspecified place or not applicable: Secondary | ICD-10-CM | POA: Insufficient documentation

## 2018-12-22 DIAGNOSIS — Z79899 Other long term (current) drug therapy: Secondary | ICD-10-CM | POA: Insufficient documentation

## 2018-12-22 DIAGNOSIS — W108XXA Fall (on) (from) other stairs and steps, initial encounter: Secondary | ICD-10-CM | POA: Diagnosis not present

## 2018-12-22 DIAGNOSIS — S99811A Other specified injuries of right ankle, initial encounter: Secondary | ICD-10-CM | POA: Diagnosis not present

## 2018-12-22 DIAGNOSIS — S82851A Displaced trimalleolar fracture of right lower leg, initial encounter for closed fracture: Secondary | ICD-10-CM | POA: Diagnosis not present

## 2018-12-22 DIAGNOSIS — S82831A Other fracture of upper and lower end of right fibula, initial encounter for closed fracture: Secondary | ICD-10-CM | POA: Diagnosis not present

## 2018-12-22 IMAGING — DX DG ANKLE COMPLETE 3+V*R*
3 series · 3 of 3 positions shown · non-contrast
Comparison: None.

CLINICAL DATA: Fall

EXAM:
RIGHT TIBIA AND FIBULA - 2 VIEW; RIGHT ANKLE - COMPLETE 3+ VIEW

[ankle ap]
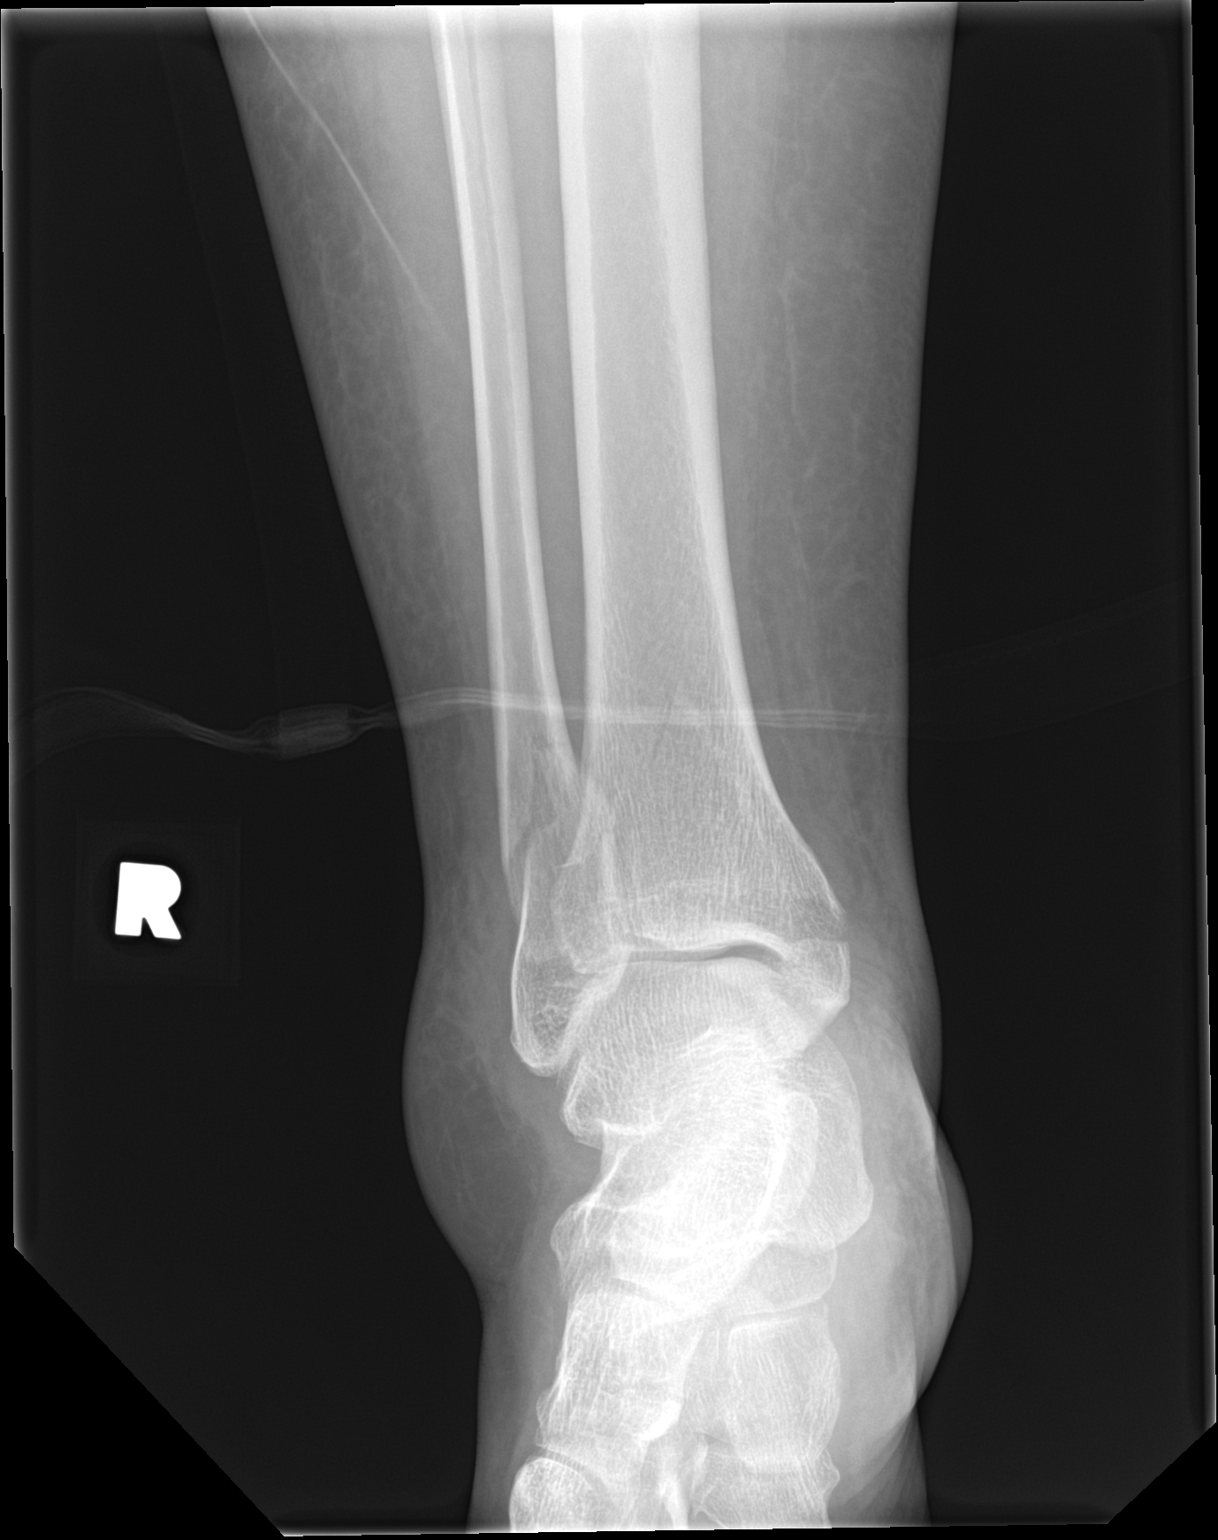

[ankle obl]
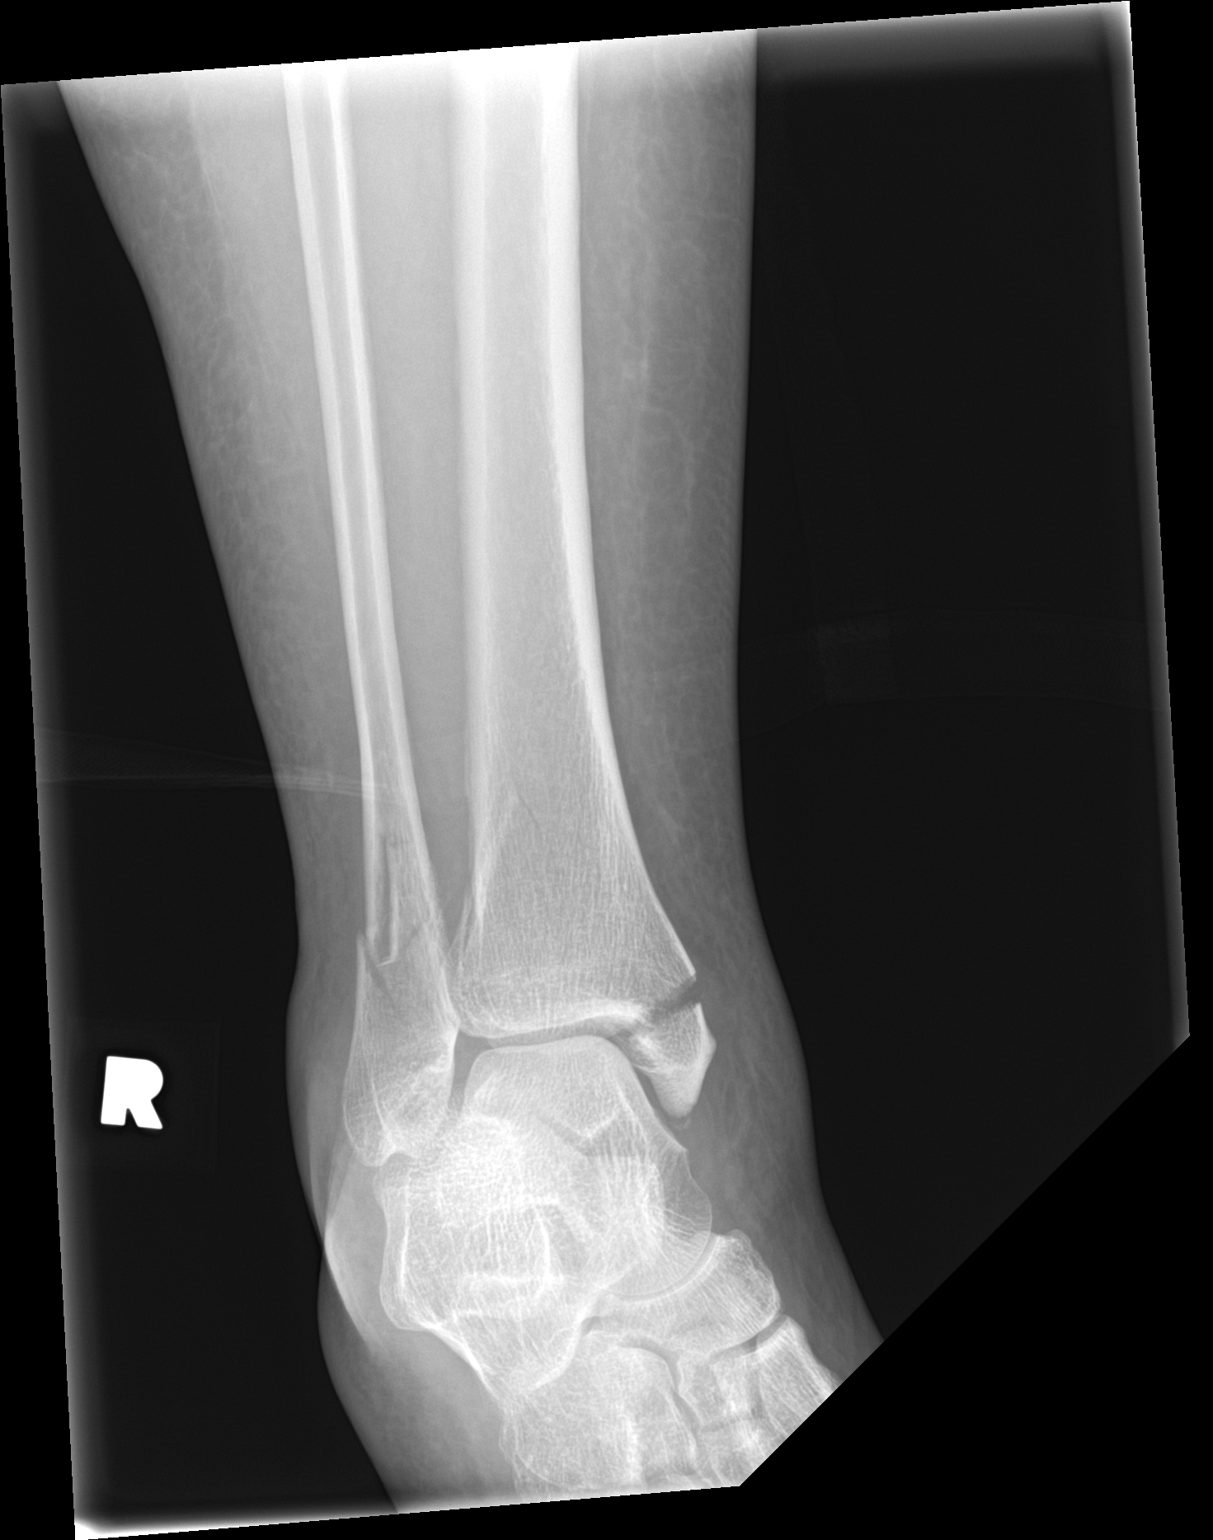

[ankle lat]
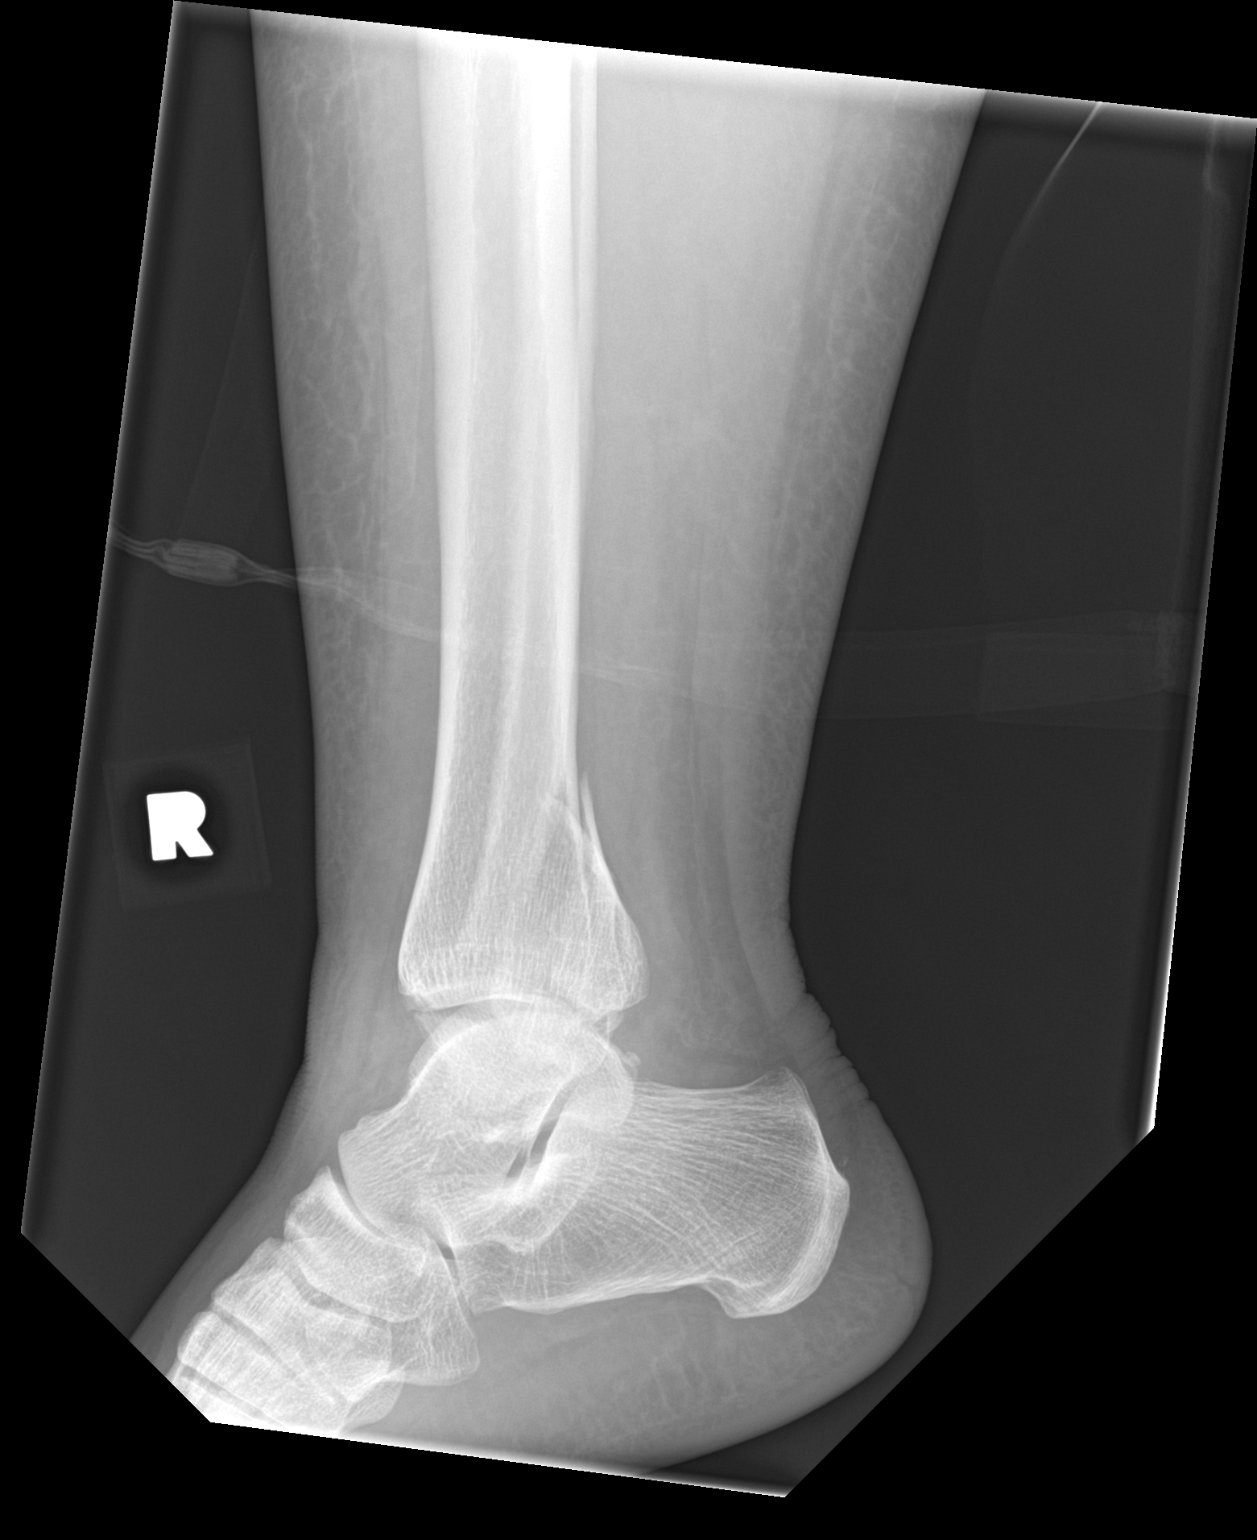

[3 of 3 positions shown; findings below may reference images not displayed]

FINDINGS: There is comminuted mildly displaced distal fibular fracture above
the level the ankle mortise. There is also a mildly displaced
posterior malleolus, and medial malleolar fractures. The ankle
mortise still appears to be congruent. There is diffuse soft tissue
swelling seen around the ankle.
IMPRESSION: Mildly displaced distal fibular, posterior malleolar, and medial
malleolar fractures.

## 2018-12-22 IMAGING — DX DG TIBIA/FIBULA 2V*R*
2 series · 2 of 2 positions shown · non-contrast
Comparison: None.

CLINICAL DATA: Fall

EXAM:
RIGHT TIBIA AND FIBULA - 2 VIEW; RIGHT ANKLE - COMPLETE 3+ VIEW

[tibia ap]
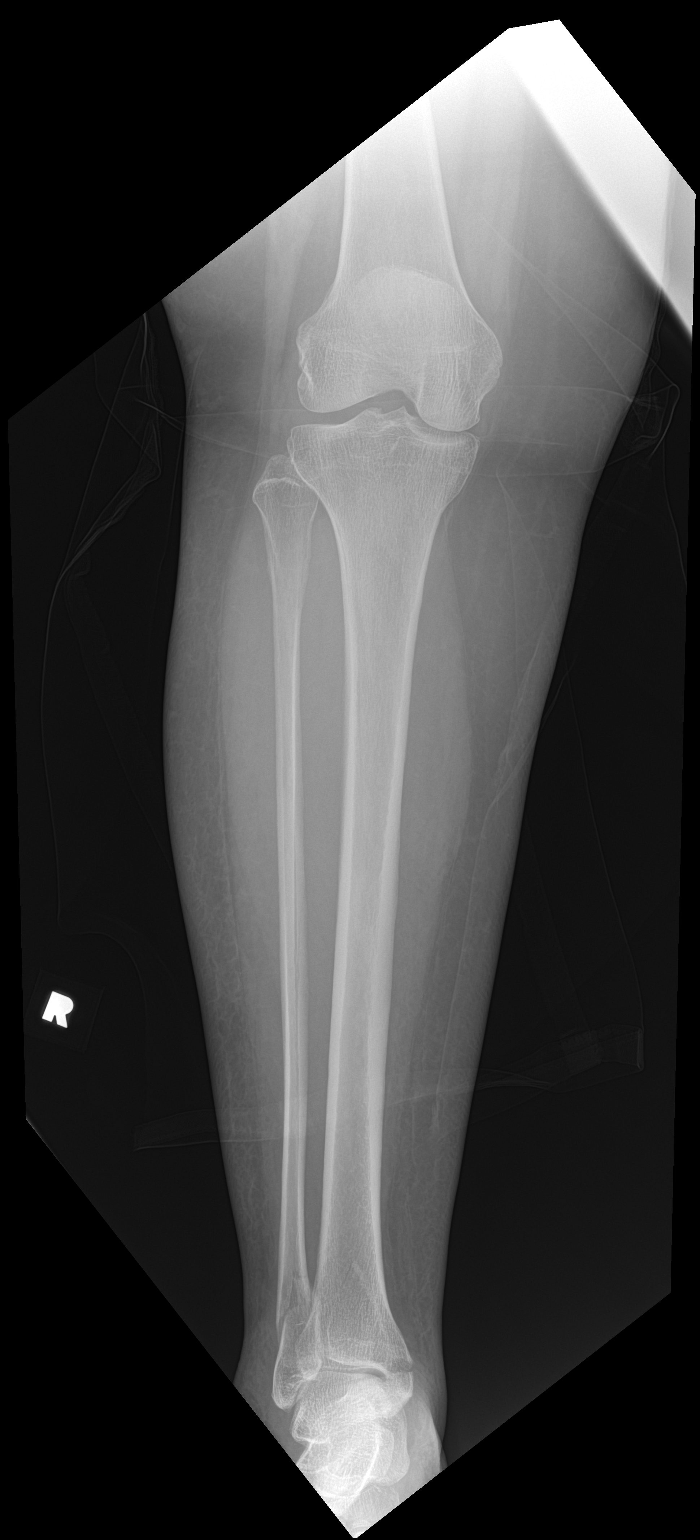

[tibia lat]
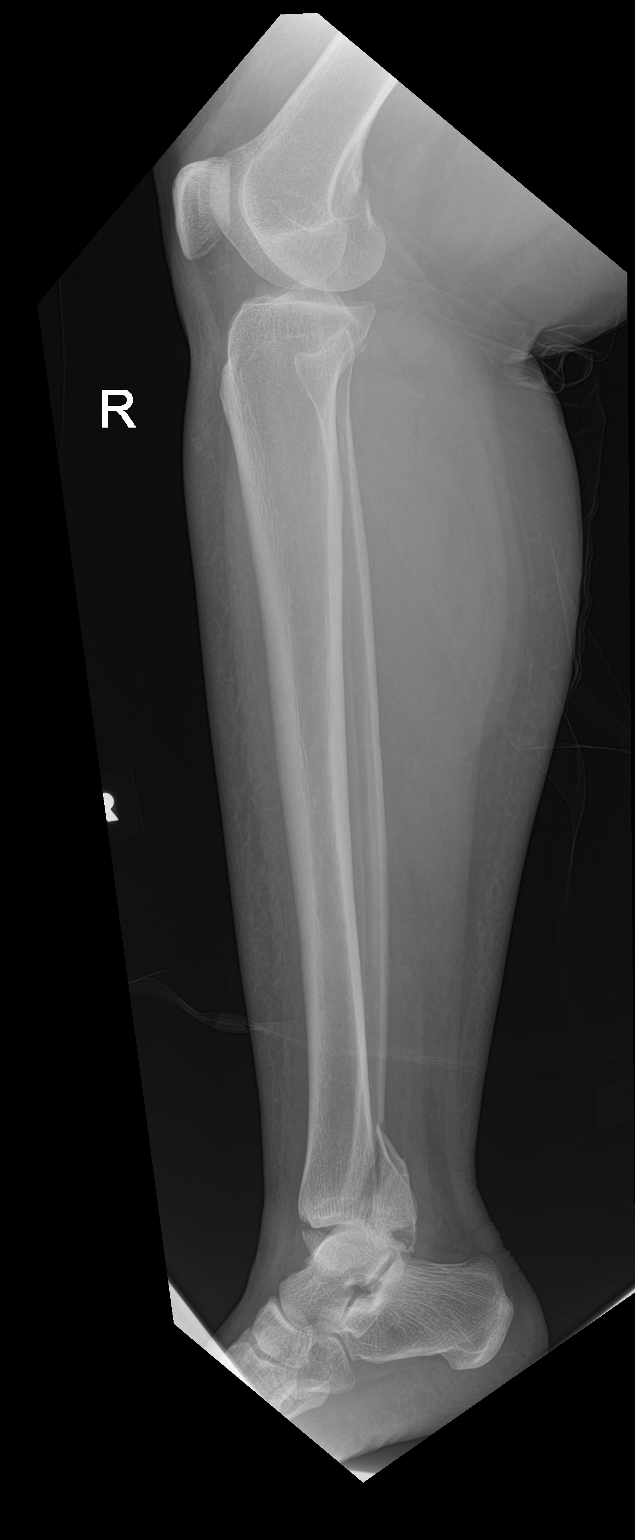

[2 of 2 positions shown; findings below may reference images not displayed]

FINDINGS: There is comminuted mildly displaced distal fibular fracture above
the level the ankle mortise. There is also a mildly displaced
posterior malleolus, and medial malleolar fractures. The ankle
mortise still appears to be congruent. There is diffuse soft tissue
swelling seen around the ankle.
IMPRESSION: Mildly displaced distal fibular, posterior malleolar, and medial
malleolar fractures.

## 2018-12-22 MED ORDER — ONDANSETRON 4 MG PO TBDP
ORAL_TABLET | ORAL | Status: AC
  Filled 2018-12-22: qty 1

## 2018-12-22 MED ORDER — ONDANSETRON 8 MG PO TBDP: 8.0000 mg | ORAL_TABLET | Freq: Three times a day (TID) | ORAL | 1 refills | Status: DC | PRN

## 2018-12-22 MED ORDER — HYDROCODONE-ACETAMINOPHEN 5-325 MG PO TABS
1.0000 | ORAL_TABLET | Freq: Once | ORAL | Status: AC
  Administered 2018-12-22: 01:00:00 1 via ORAL
  Filled 2018-12-22: qty 1

## 2018-12-22 MED ORDER — ONDANSETRON 8 MG PO TBDP
8.0000 mg | ORAL_TABLET | Freq: Once | ORAL | Status: AC
  Administered 2018-12-22: 8 mg via ORAL

## 2018-12-22 MED ORDER — OXYCODONE-ACETAMINOPHEN 10-325 MG PO TABS: 1.0000 | ORAL_TABLET | Freq: Four times a day (QID) | ORAL | 0 refills | Status: DC | PRN

## 2018-12-22 MED ORDER — NAPROXEN 250 MG PO TABS
500.0000 mg | ORAL_TABLET | Freq: Once | ORAL | Status: AC
  Administered 2018-12-22: 500 mg via ORAL
  Filled 2018-12-22: qty 2

## 2018-12-22 NOTE — ED Provider Notes (Signed)
[facility] Provider Note: Georgena Spurling, MD, FACEP  CSN: [id number] MRN: [id number] ARRIVAL: 12/22/18 at [place]: [place]  Ankle Injury   HISTORY OF PRESENT ILLNESS  12/22/18 12:57 AM Lynn Simpson is a 48 y.o. female who fell about 10:30 PM yesterday evening after missing a step.  She is having pain in her right shin and right ankle.  There is swelling and ecchymosis over the right lateral and medial malleoli.  She rates her pain as a 7 out of 10, worse with attempted weightbearing.  She denies other injury.  She is having some paresthesias of the foot distal to the injury.   Past Medical History:  Diagnosis Date  . Allergy   . Anxiety   . Asthma   . Back pain   . Cancer ([place]) 2018   Left Breast  . Depression   . Dyspnea   . Family history of breast cancer   . Family history of melanoma   . Family history of ovarian cancer   . Gallstones   . GERD (gastroesophageal reflux disease)   . IBS (irritable bowel syndrome)   . Lower extremity edema   . Lynch syndrome   . Lynch syndrome   . Migraine   . Obesity   . Ovarian cyst   . Vitamin D deficiency     Past Surgical History:  Procedure Laterality Date  . BREAST RECONSTRUCTION WITH PLACEMENT OF TISSUE EXPANDER AND FLEX HD (ACELLULAR HYDRATED DERMIS) Bilateral 03/31/2016   Procedure: BILATERAL BREAST RECONSTRUCTION WITH PLACEMENT OF TISSUE EXPANDER AND FLEX HD (ACELLULAR HYDRATED DERMIS);  Surgeon: Wallace Going, DO;  Location: [facility];  Service: Plastics;  Laterality: Bilateral;  . CESAREAN SECTION  2005  . COLONOSCOPY    . LAPAROSCOPIC VAGINAL HYSTERECTOMY WITH SALPINGO OOPHORECTOMY Bilateral 09/15/2016   Procedure: LAPAROSCOPIC ASSISTED VAGINAL HYSTERECTOMY WITH BILATERAL SALPINGO OOPHORECTOMY;  Surgeon: Molli Posey, MD;  Location: [facility];  Service: Gynecology;  Laterality: Bilateral;  . LIPOSUCTION Bilateral 06/30/2016   Procedure: LIPOSUCTION TO LATERAL  CHEST;  Surgeon: Wallace Going, DO;  Location: [facility];  Service: Plastics;  Laterality: Bilateral;  . NIPPLE SPARING MASTECTOMY Bilateral 03/31/2016   Procedure: BILATERAL NIPPLE SPARING MASTECTOMIES;  Surgeon: Autumn Messing III, MD;  Location: [facility];  Service: General;  Laterality: Bilateral;  . POLYPECTOMY    . REMOVAL OF BILATERAL TISSUE EXPANDERS WITH PLACEMENT OF BILATERAL BREAST IMPLANTS Bilateral 06/30/2016   Procedure: REMOVAL OF BILATERAL TISSUE EXPANDERS WITH PLACEMENT OF BILATERAL SILCONE BREAST IMPLANTS;  Surgeon: Wallace Going, DO;  Location: [facility];  Service: Plastics;  Laterality: Bilateral;  . WISDOM TOOTH EXTRACTION      Family History  Problem Relation Age of Onset  . Breast cancer Mother 35  . Liver cancer Mother   . Alcoholism Mother   . Lung cancer Father 16  . Breast cancer Sister 25  . Breast cancer Maternal Grandmother   . Ovarian cancer Maternal Grandmother   . Colon cancer Maternal Grandmother   . Cancer Paternal Grandfather        NOS  . Melanoma Maternal Aunt        mother's maternal half sister  . Colon polyps Neg Hx   . Rectal cancer Neg Hx   . Stomach cancer Neg Hx   . Esophageal cancer Neg Hx     Social History   Tobacco Use  . Smoking status: Former Smoker  Packs/day: 0.50    Years: 20.00    Pack years: 10.00    Types: Cigarettes    Quit date: 12/20/2015    Years since quitting: 3.0  . Smokeless tobacco: Never Used  . Tobacco comment: form given [id number]  Substance Use Topics  . Alcohol use: Not Currently    Comment: occ  . Drug use: No    Prior to Admission medications   Medication Sig Start Date End Date Taking? Authorizing Provider  albuterol (VENTOLIN HFA) 108 (90 Base) MCG/ACT inhaler Inhale into the lungs every 6 (six) hours as needed for wheezing or shortness of breath.    [provider]  clonazePAM (KLONOPIN) 0.5 MG tablet daily as needed.    [provider]  diphenhydrAMINE (BENADRYL) 50 MG tablet Take 1 tablet (50 mg total) by mouth at bedtime as needed for sleep. 11/28/18   Dennard Nip D, MD  esomeprazole (NEXIUM) 40 MG capsule Take 1 capsule (40 mg total) by mouth 2 (two) times daily. 02/02/18   Milus Banister, MD  sertraline (ZOLOFT) 50 MG tablet Take 1 tablet by mouth daily. 03/11/18   [provider]  Vitamin D, Ergocalciferol, (DRISDOL) 1.25 MG (50000 UT) CAPS capsule Take 1 capsule (50,000 Units total) by mouth every 7 (seven) days. 11/28/18   Dennard Nip D, MD    Allergies Bee venom and Wellbutrin [bupropion]   REVIEW OF SYSTEMS  Negative except as noted here or in the History of Present Illness.   PHYSICAL EXAMINATION  Initial Vital Signs Blood pressure 115/74, pulse 100, temperature 97.8 F (36.6 C), temperature source Oral, resp. rate 18, height 5\' 2"  (1.575 m), weight 122.5 kg, last menstrual period 09/07/2016, SpO2 98 %.  Examination General: Well-developed, well-nourished female in no acute distress; appearance consistent with age of record HENT: normocephalic; atraumatic Eyes: pupils equal, round and reactive to light; extraocular muscles intact Neck: supple; nontender Heart: regular rate and rhythm Lungs: clear to auscultation bilaterally Chest: Nontender Abdomen: soft; nondistended; nontender; bowel sounds present Extremities: No deformity; pulses normal; swelling and ecchymosis of left medial and lateral malleoli with decreased range of motion of the left ankle joint, left foot distally with brisk capillary refill and intact tendon function distally, sensation intact but altered distally, no proximal fibular tenderness:      Neurologic: Awake, alert and oriented; motor function intact in all extremities and symmetric; no facial droop Skin: Warm and dry Psychiatric: Normal mood and affect   RESULTS  Summary of this visit's results, reviewed by myself:   EKG Interpretation   Date/Time:    Ventricular Rate:    PR Interval:    QRS Duration:   QT Interval:    QTC Calculation:   R Axis:     Text Interpretation:        Laboratory Studies: No results found for this or any previous visit (from the past 24 hour(s)). Imaging Studies: Dg Tibia/fibula Right  Result Date: 12/22/2018 CLINICAL DATA:  Fall EXAM: RIGHT TIBIA AND FIBULA - 2 VIEW; RIGHT ANKLE - COMPLETE 3+ VIEW COMPARISON:  None. FINDINGS: There is comminuted mildly displaced distal fibular fracture above the level the ankle mortise. There is also a mildly displaced posterior malleolus, and medial malleolar fractures. The ankle mortise still appears to be congruent. There is diffuse soft tissue swelling seen around the ankle. IMPRESSION: Mildly displaced distal fibular, posterior malleolar, and medial malleolar fractures. Electronically Signed   By: Prudencio Pair M.D.   On: 12/22/2018 01:44  Dg Ankle Complete Right  Result Date: 12/22/2018 CLINICAL DATA:  Fall EXAM: RIGHT TIBIA AND FIBULA - 2 VIEW; RIGHT ANKLE - COMPLETE 3+ VIEW COMPARISON:  None. FINDINGS: There is comminuted mildly displaced distal fibular fracture above the level the ankle mortise. There is also a mildly displaced posterior malleolus, and medial malleolar fractures. The ankle mortise still appears to be congruent. There is diffuse soft tissue swelling seen around the ankle. IMPRESSION: Mildly displaced distal fibular, posterior malleolar, and medial malleolar fractures. Electronically Signed   By: Prudencio Pair M.D.   On: 12/22/2018 01:44    ED COURSE and MDM  Nursing notes and initial vitals signs, including pulse oximetry, reviewed.  Vitals:   12/22/18 0041 12/22/18 0043  BP: 115/74   Pulse: 100   Resp: 18   Temp: 97.8 F (36.6 C)   TempSrc: Oral   SpO2: 98%   Weight:  122.5 kg  Height:  5\' 2"  (1.575 m)   1:57 AM Patient placed in posterior short leg splint with stirrup by ED techs.  Foot remains neurovascularly intact as per  initial exam.  Patient will be provided with crutches and kept nonweightbearing pending follow-up with orthopedics.  She will need definitive surgery for this injury.  PROCEDURES    ED DIAGNOSES     ICD-10-CM   1. Trimalleolar fracture of ankle, closed, right, initial encounter  S82.851A   2. Fall on steps, initial encounter  W10.8XXA        Irving Bloor, Jenny Reichmann, MD 12/22/18 [phone]

## 2018-12-22 NOTE — ED Triage Notes (Signed)
Right leg and ankle pain with significant bruising, swelling noted.  Pulses intact.

## 2018-12-24 ENCOUNTER — Telehealth (INDEPENDENT_AMBULATORY_CARE_PROVIDER_SITE_OTHER): Payer: BC Managed Care – PPO | Admitting: Family Medicine

## 2018-12-24 ENCOUNTER — Other Ambulatory Visit: Payer: Self-pay

## 2018-12-24 ENCOUNTER — Encounter (INDEPENDENT_AMBULATORY_CARE_PROVIDER_SITE_OTHER): Payer: Self-pay | Admitting: Family Medicine

## 2018-12-24 ENCOUNTER — Ambulatory Visit (INDEPENDENT_AMBULATORY_CARE_PROVIDER_SITE_OTHER): Payer: BC Managed Care – PPO | Admitting: Family Medicine

## 2018-12-24 DIAGNOSIS — E559 Vitamin D deficiency, unspecified: Secondary | ICD-10-CM

## 2018-12-24 DIAGNOSIS — Z6841 Body Mass Index (BMI) 40.0 and over, adult: Secondary | ICD-10-CM | POA: Diagnosis not present

## 2018-12-24 DIAGNOSIS — Z9189 Other specified personal risk factors, not elsewhere classified: Secondary | ICD-10-CM | POA: Diagnosis not present

## 2018-12-24 DIAGNOSIS — F5101 Primary insomnia: Secondary | ICD-10-CM

## 2018-12-24 DIAGNOSIS — E66813 Obesity, class 3: Secondary | ICD-10-CM

## 2018-12-24 MED ORDER — VITAMIN D (ERGOCALCIFEROL) 1.25 MG (50000 UNIT) PO CAPS: 50000.0000 [IU] | ORAL_CAPSULE | ORAL | 1 refills | Status: DC

## 2018-12-25 ENCOUNTER — Other Ambulatory Visit: Payer: Self-pay | Admitting: Orthopaedic Surgery

## 2018-12-25 ENCOUNTER — Ambulatory Visit
Admission: RE | Admit: 2018-12-25 | Discharge: 2018-12-25 | Disposition: A | Discharge status: Home / Self Care | Payer: BC Managed Care – PPO | Source: Ambulatory Visit | Attending: Orthopaedic Surgery | Admitting: Orthopaedic Surgery

## 2018-12-25 DIAGNOSIS — M25571 Pain in right ankle and joints of right foot: Secondary | ICD-10-CM

## 2018-12-25 DIAGNOSIS — S82851A Displaced trimalleolar fracture of right lower leg, initial encounter for closed fracture: Secondary | ICD-10-CM

## 2018-12-25 IMAGING — CT CT ANKLE*R* W/O CM
1 series · 12 of 14 positions shown, 15 images · non-contrast
Comparison: Radiographs dated [DATE]

CLINICAL DATA: Right ankle pain and swelling. Trimalleolar fracture
on [DATE]

EXAM:
CT OF THE RIGHT ANKLE WITHOUT CONTRAST
TECHNIQUE: Multidetector CT imaging of the right ankle was performed according
to the standard protocol. Multiplanar CT image reconstructions were
also generated.

[Series 6: sfov lower extremity 2.00 br40 s3 soft · axial · 0.27mm/px · z∈[+723,+877]mm · 12 of 93 slices shown, 15 images]
[im 8/93  soft-tissue]
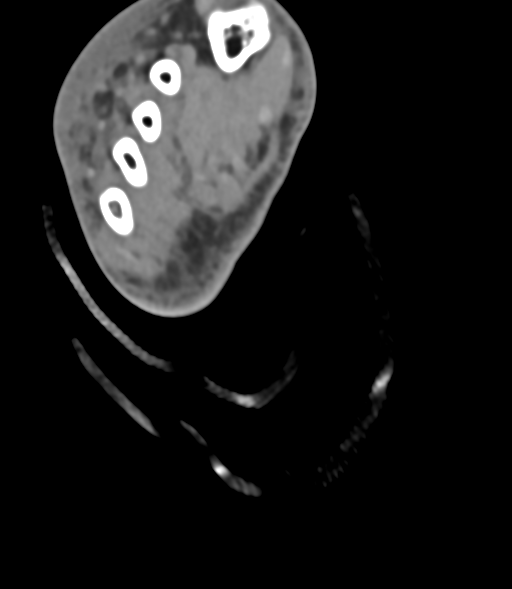
[im 8/93  bone]
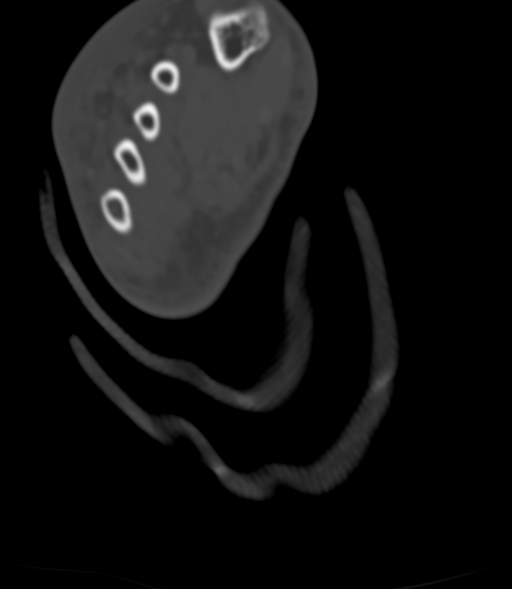
[im 15/93  bone]
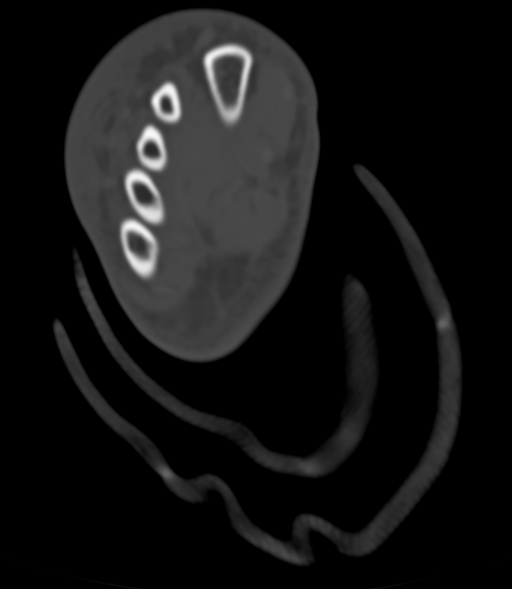
[im 22/93  bone]
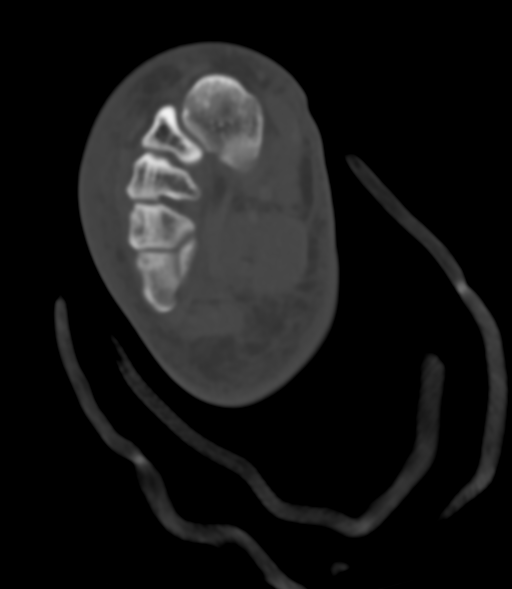
[im 29/93  bone]
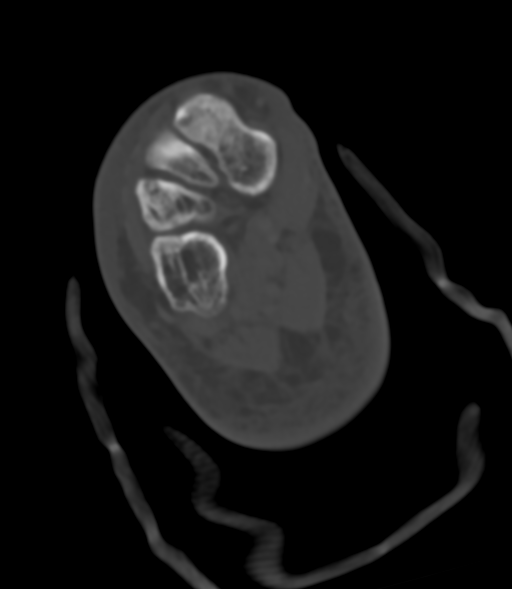
[im 36/93  soft-tissue]
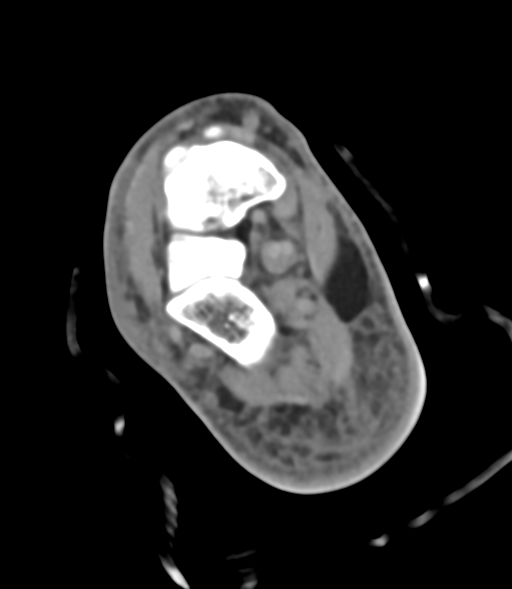
[im 36/93  bone]
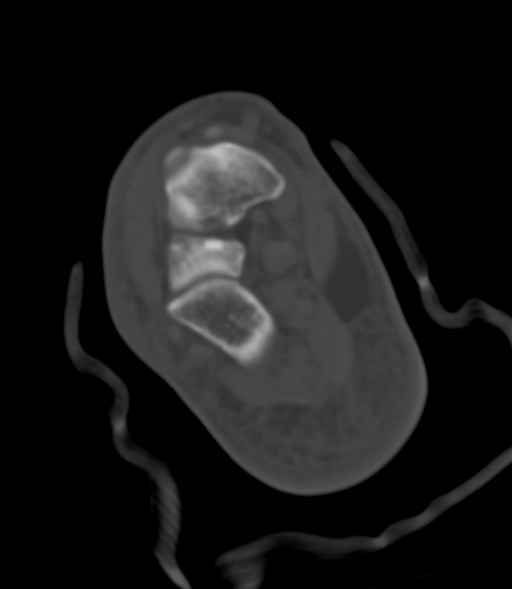
[im 43/93  bone]
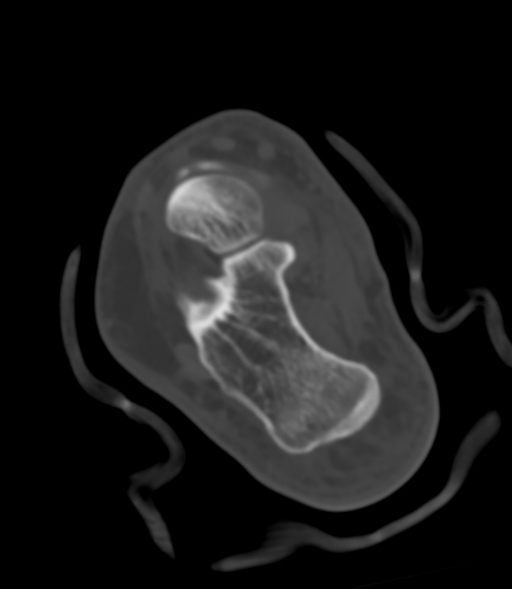
[im 50/93  bone]
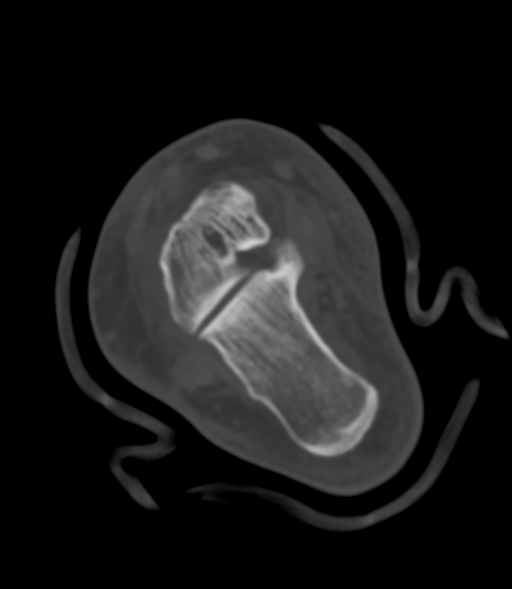
[im 57/93  bone]
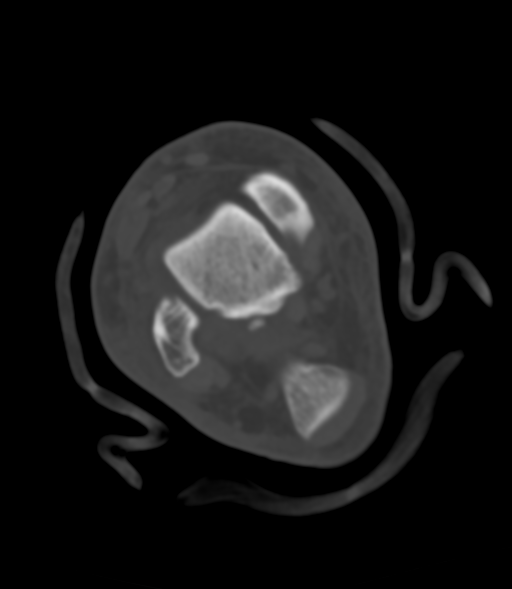
[im 64/93  soft-tissue]
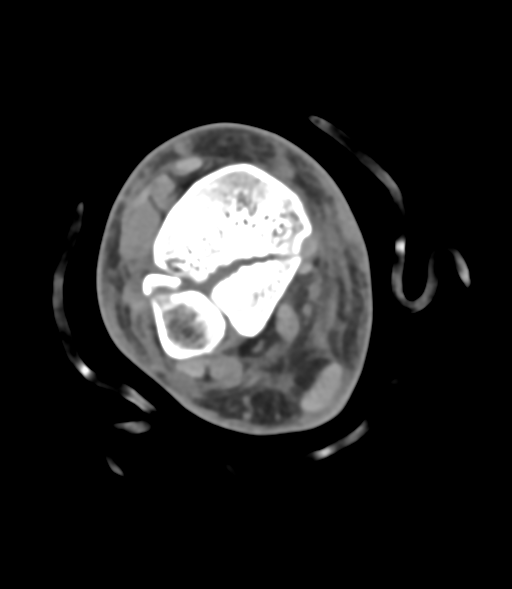
[im 64/93  bone]
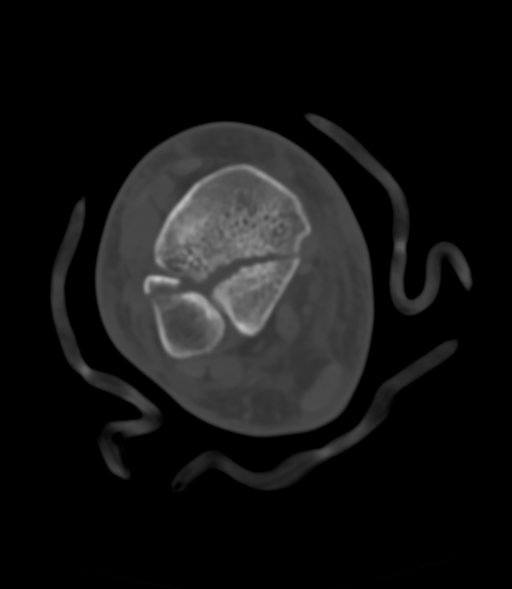
[im 71/93  bone]
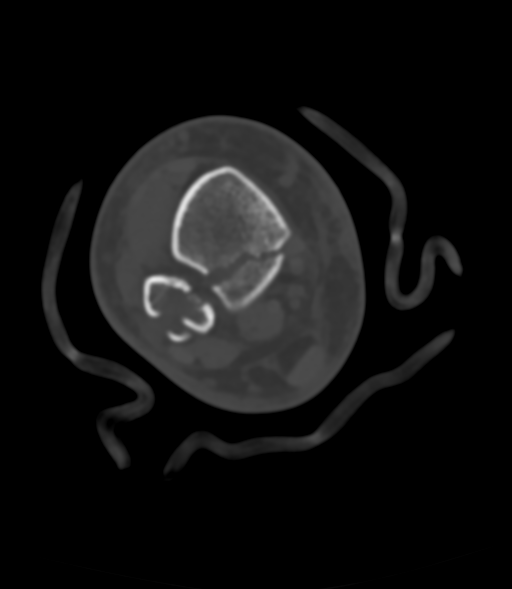
[im 78/93  bone]
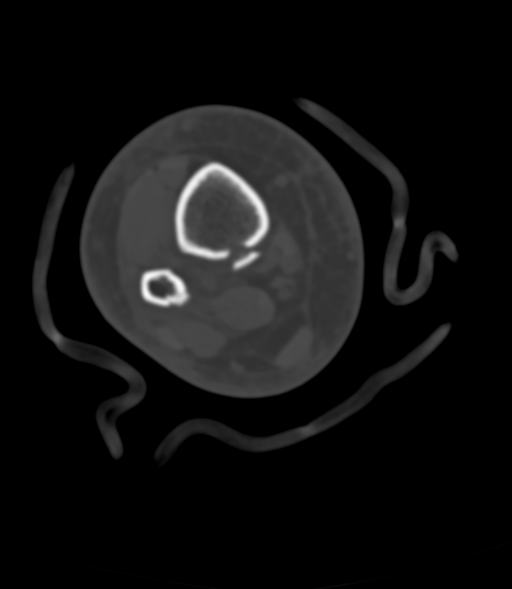
[im 85/93  bone]
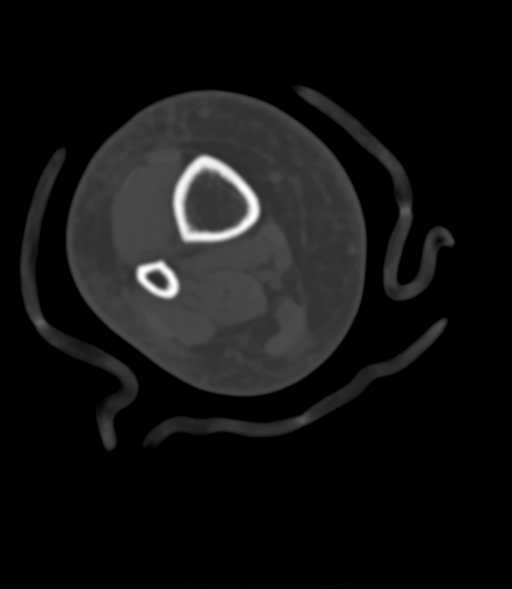

[12 of 14 positions shown; findings below may reference images not displayed]

FINDINGS: Bones/Joint/Cartilage

There is a horizontal fracture through the base of the medial
malleolus with 2.4 mm of distraction. There is a spiral fracture of
the distal fibula with slight impaction and distraction. There is a
fracture of the posterior malleolus with 3.4 mm maximum distraction.
The fracture involves a maximum of approximately 40% of the
articular surface of the posterolateral aspect of the distal tibia.

Ligaments

Suboptimally assessed by CT. The medial and lateral ankle ligaments
appear to be intact.

Muscles and Tendons

No tendon entrapment.

Soft tissues

Slight subcutaneous edema around the ankle extending onto the foot.
IMPRESSION: Trimalleolar fracture of the right ankle as described.

## 2018-12-26 DIAGNOSIS — F4323 Adjustment disorder with mixed anxiety and depressed mood: Secondary | ICD-10-CM | POA: Diagnosis not present

## 2018-12-26 NOTE — Progress Notes (Signed)
Office: [phone]  /  Fax: [phone] TeleHealth Visit:  Lynn Simpson has verbally consented to this TeleHealth visit today. The patient is located at home, the provider is located at the [employer] [facility] office. The participants in this visit include the listed provider and patient and any and all parties involved. The visit was conducted today via FaceTime.  HPI:   Chief Complaint: OBESITY Lynn Simpson is here to discuss her progress with her obesity treatment plan. She is on the Category 3 plan and is following her eating plan approximately 50 % of the time. She states she is exercising 0 minutes 0 times per week. Lynn Simpson broke her ankle three days ago and she will need surgery. She is trying to stick to the plan. Her appetite has been decreased since the accident. We were unable to weigh the patient today for this TeleHealth visit. She feels as if she has lost weight since her last visit (weight not reported). She has lost 7 lbs since starting treatment with Lynn Simpson.  Insomnia Lynn Simpson has a diagnosis of insomnia and she was advised to take 50 mg of Benadryl last week. She took 25 mg and it worked well. Since she broke her ankle however, she is sleeping well with opioid pain medications.  Vitamin D deficiency Lynn Simpson has a diagnosis of vitamin D deficiency. Her last vitamin D level was very low at 11.7 on 08/21/18. She is currently taking vit D and denies nausea, vomiting or muscle weakness.   ASSESSMENT AND PLAN:  Vitamin D deficiency - Plan: Vitamin D, Ergocalciferol, (DRISDOL) 1.25 MG (50000 UT) CAPS capsule  Primary insomnia  Class 3 severe obesity with serious comorbidity and body mass index (BMI) of 45.0 to 49.9 in adult, unspecified obesity type ([place])  PLAN:  Insomnia Lynn Simpson will hold off on Benadryl while taking opioids. She agrees to follow up as directed.  Vitamin D Deficiency Lynn Simpson was informed that low vitamin D levels contributes to fatigue and are associated with obesity,  breast, and colon cancer. Lynn Simpson agrees to continue to take prescription Vit D @50 ,000 IU every week #4 with no refills and she will follow up for routine testing of vitamin D, at least 2-3 times per year. She was informed of the risk of over-replacement of vitamin D and agrees to not increase her dose unless she discusses this with Lynn Simpson first. Lynn Simpson agrees to follow up as directed.   Obesity Lynn Simpson is currently in the action stage of change. As such, her goal is to continue with weight loss efforts She has agreed to follow the Category 3 plan Lynn Simpson has not been prescribed exercise at this time. We discussed the following Behavioral Modification Strategies today: increasing lean protein intake and planning for success Lynn Simpson would like to try to stick to the Category 2 plan, while recovering from ankle surgery.  Lynn Simpson 's follow up with our clinic is to be determined. Patient will call to set up appointment after ankle surgery. She was informed of the importance of frequent follow up visits to maximize her success with intensive lifestyle modifications for her multiple health conditions.  ALLERGIES: Allergies  Allergen Reactions  . Bee Venom Anaphylaxis  . Wellbutrin [Bupropion] Anaphylaxis    MEDICATIONS: Current Outpatient Medications on File Prior to Visit  Medication Sig Dispense Refill  . albuterol (VENTOLIN HFA) 108 (90 Base) MCG/ACT inhaler Inhale into the lungs every 6 (six) hours as needed for wheezing or shortness of breath.    . clonazePAM (KLONOPIN) 0.5  MG tablet daily as needed.    . diphenhydrAMINE (BENADRYL) 50 MG tablet Take 1 tablet (50 mg total) by mouth at bedtime as needed for sleep. 30 tablet 0  . esomeprazole (NEXIUM) 40 MG capsule Take 1 capsule (40 mg total) by mouth 2 (two) times daily. 60 capsule 5  . ondansetron (ZOFRAN ODT) 8 MG disintegrating tablet Take 1 tablet (8 mg total) by mouth every 8 (eight) hours as needed for nausea or vomiting. 10 tablet 1  .  oxyCODONE-acetaminophen (PERCOCET) 10-325 MG tablet Take 1 tablet by mouth every 6 (six) hours as needed for pain. 20 tablet 0  . sertraline (ZOLOFT) 50 MG tablet Take 1 tablet by mouth daily.     No current facility-administered medications on file prior to visit.     PAST MEDICAL HISTORY: Past Medical History:  Diagnosis Date  . Allergy   . Anxiety   . Asthma   . Back pain   . Cancer ([place]) 2018   Left Breast  . Depression   . Dyspnea   . Family history of breast cancer   . Family history of melanoma   . Family history of ovarian cancer   . Gallstones   . GERD (gastroesophageal reflux disease)   . IBS (irritable bowel syndrome)   . Lower extremity edema   . Lynch syndrome   . Lynch syndrome   . Migraine   . Obesity   . Ovarian cyst   . Vitamin D deficiency     PAST SURGICAL HISTORY: Past Surgical History:  Procedure Laterality Date  . BREAST RECONSTRUCTION WITH PLACEMENT OF TISSUE EXPANDER AND FLEX HD (ACELLULAR HYDRATED DERMIS) Bilateral 03/31/2016   Procedure: BILATERAL BREAST RECONSTRUCTION WITH PLACEMENT OF TISSUE EXPANDER AND FLEX HD (ACELLULAR HYDRATED DERMIS);  Surgeon: Wallace Going, DO;  Location: [place];  Service: Plastics;  Laterality: Bilateral;  . CESAREAN SECTION  2005  . COLONOSCOPY    . LAPAROSCOPIC VAGINAL HYSTERECTOMY WITH SALPINGO OOPHORECTOMY Bilateral 09/15/2016   Procedure: LAPAROSCOPIC ASSISTED VAGINAL HYSTERECTOMY WITH BILATERAL SALPINGO OOPHORECTOMY;  Surgeon: Molli Posey, MD;  Location: [facility];  Service: Gynecology;  Laterality: Bilateral;  . LIPOSUCTION Bilateral 06/30/2016   Procedure: LIPOSUCTION TO LATERAL CHEST;  Surgeon: Wallace Going, DO;  Location: [place];  Service: Plastics;  Laterality: Bilateral;  . NIPPLE SPARING MASTECTOMY Bilateral 03/31/2016   Procedure: BILATERAL NIPPLE SPARING MASTECTOMIES;  Surgeon: Autumn Messing III, MD;  Location: [place];  Service: General;   Laterality: Bilateral;  . POLYPECTOMY    . REMOVAL OF BILATERAL TISSUE EXPANDERS WITH PLACEMENT OF BILATERAL BREAST IMPLANTS Bilateral 06/30/2016   Procedure: REMOVAL OF BILATERAL TISSUE EXPANDERS WITH PLACEMENT OF BILATERAL SILCONE BREAST IMPLANTS;  Surgeon: Wallace Going, DO;  Location: [place];  Service: Plastics;  Laterality: Bilateral;  . WISDOM TOOTH EXTRACTION      SOCIAL HISTORY: Social History   Tobacco Use  . Smoking status: Former Smoker    Packs/day: 0.50    Years: 20.00    Pack years: 10.00    Types: Cigarettes    Quit date: 12/20/2015    Years since quitting: 3.0  . Smokeless tobacco: Never Used  . Tobacco comment: form given [id number]  Substance Use Topics  . Alcohol use: Not Currently    Comment: occ  . Drug use: No    FAMILY HISTORY: Family History  Problem Relation Age of Onset  . Breast cancer Mother 44  . Liver cancer Mother   .  Alcoholism Mother   . Lung cancer Father 52  . Breast cancer Sister 81  . Breast cancer Maternal Grandmother   . Ovarian cancer Maternal Grandmother   . Colon cancer Maternal Grandmother   . Cancer Paternal Grandfather        NOS  . Melanoma Maternal Aunt        mother's maternal half sister  . Colon polyps Neg Hx   . Rectal cancer Neg Hx   . Stomach cancer Neg Hx   . Esophageal cancer Neg Hx     ROS: Review of Systems  Constitutional: Positive for weight loss.  Gastrointestinal: Negative for nausea and vomiting.  Musculoskeletal:       Negative for muscle weakness  Psychiatric/Behavioral: The patient has insomnia.     PHYSICAL EXAM: Pt in no acute distress  RECENT LABS AND TESTS: BMET    Component Value Date/Time   NA 140 08/21/2018 0000   K 4.4 08/21/2018 0000   CL 103 08/21/2018 0000   CO2 23 08/21/2018 0000   GLUCOSE 93 08/21/2018 0000   GLUCOSE 85 09/17/2016 0542   BUN 11 08/21/2018 0000   CREATININE 0.67 08/21/2018 0000   CALCIUM 9.3 08/21/2018 0000   GFRNONAA 106 08/21/2018  0000   GFRAA 122 08/21/2018 0000   Lab Results  Component Value Date   HGBA1C 5.4 08/21/2018   Lab Results  Component Value Date   INSULIN 18.6 08/21/2018   CBC    Component Value Date/Time   WBC 7.9 08/21/2018 0000   WBC 14.3 (H) 09/17/2016 0513   RBC 5.00 08/21/2018 0000   RBC 3.96 09/17/2016 0513   HGB 15.1 08/21/2018 0000   HCT 46.0 08/21/2018 0000   PLT 134 (L) 09/17/2016 0513   MCV 92 08/21/2018 0000   MCH 30.2 08/21/2018 0000   MCH 30.1 09/17/2016 0513   MCHC 32.8 08/21/2018 0000   MCHC 33.4 09/17/2016 0513   RDW 12.5 08/21/2018 0000   LYMPHSABS 2.3 08/21/2018 0000   MONOABS 0.5 09/17/2016 0513   EOSABS 0.3 08/21/2018 0000   BASOSABS 0.1 08/21/2018 0000   Iron/TIBC/Ferritin/ %Sat No results found for: IRON, TIBC, FERRITIN, IRONPCTSAT Lipid Panel     Component Value Date/Time   CHOL 212 (H) 08/21/2018 0000   TRIG 79 08/21/2018 0000   HDL 59 08/21/2018 0000   LDLCALC 137 (H) 08/21/2018 0000   Hepatic Function Panel     Component Value Date/Time   PROT 6.6 08/21/2018 0000   ALBUMIN 4.5 08/21/2018 0000   AST 17 08/21/2018 0000   ALT 24 08/21/2018 0000   ALKPHOS 135 (H) 08/21/2018 0000   BILITOT 0.2 08/21/2018 0000   BILIDIR <0.1 09/12/2009 0345   IBILI NOT CALCULATED 09/12/2009 0345      Component Value Date/Time   TSH 3.220 08/21/2018 0000     Ref. Range 08/21/2018 00:00  Vitamin D, 25-Hydroxy Latest Ref Range: 30.0 - 100.0 ng/mL 11.7 (L)    I, Doreene Nest, am acting as Location manager for [employer], FNP-C  I have reviewed the above documentation for accuracy and completeness, and I agree with the above.  - Aniken Monestime, FNP-C.

## 2018-12-31 DIAGNOSIS — X58XXXA Exposure to other specified factors, initial encounter: Secondary | ICD-10-CM | POA: Diagnosis not present

## 2018-12-31 DIAGNOSIS — W19XXXA Unspecified fall, initial encounter: Secondary | ICD-10-CM | POA: Diagnosis not present

## 2018-12-31 DIAGNOSIS — S82851A Displaced trimalleolar fracture of right lower leg, initial encounter for closed fracture: Secondary | ICD-10-CM | POA: Diagnosis not present

## 2018-12-31 DIAGNOSIS — G8918 Other acute postprocedural pain: Secondary | ICD-10-CM | POA: Diagnosis not present

## 2019-01-03 DIAGNOSIS — S82851A Displaced trimalleolar fracture of right lower leg, initial encounter for closed fracture: Secondary | ICD-10-CM | POA: Diagnosis not present

## 2019-01-08 DIAGNOSIS — S82851D Displaced trimalleolar fracture of right lower leg, subsequent encounter for closed fracture with routine healing: Secondary | ICD-10-CM | POA: Diagnosis not present

## 2019-01-09 DIAGNOSIS — S99911A Unspecified injury of right ankle, initial encounter: Secondary | ICD-10-CM | POA: Diagnosis not present

## 2019-01-21 DIAGNOSIS — F4323 Adjustment disorder with mixed anxiety and depressed mood: Secondary | ICD-10-CM | POA: Diagnosis not present

## 2019-01-29 DIAGNOSIS — S82851D Displaced trimalleolar fracture of right lower leg, subsequent encounter for closed fracture with routine healing: Secondary | ICD-10-CM | POA: Diagnosis not present

## 2019-02-01 DIAGNOSIS — R262 Difficulty in walking, not elsewhere classified: Secondary | ICD-10-CM | POA: Diagnosis not present

## 2019-02-01 DIAGNOSIS — M25671 Stiffness of right ankle, not elsewhere classified: Secondary | ICD-10-CM | POA: Diagnosis not present

## 2019-02-01 DIAGNOSIS — S82851D Displaced trimalleolar fracture of right lower leg, subsequent encounter for closed fracture with routine healing: Secondary | ICD-10-CM | POA: Diagnosis not present

## 2019-02-06 DIAGNOSIS — M25671 Stiffness of right ankle, not elsewhere classified: Secondary | ICD-10-CM | POA: Diagnosis not present

## 2019-02-06 DIAGNOSIS — S82851D Displaced trimalleolar fracture of right lower leg, subsequent encounter for closed fracture with routine healing: Secondary | ICD-10-CM | POA: Diagnosis not present

## 2019-02-06 DIAGNOSIS — R262 Difficulty in walking, not elsewhere classified: Secondary | ICD-10-CM | POA: Diagnosis not present

## 2019-02-09 DIAGNOSIS — S99911A Unspecified injury of right ankle, initial encounter: Secondary | ICD-10-CM | POA: Diagnosis not present

## 2019-02-11 DIAGNOSIS — S82851D Displaced trimalleolar fracture of right lower leg, subsequent encounter for closed fracture with routine healing: Secondary | ICD-10-CM | POA: Diagnosis not present

## 2019-02-11 DIAGNOSIS — R262 Difficulty in walking, not elsewhere classified: Secondary | ICD-10-CM | POA: Diagnosis not present

## 2019-02-11 DIAGNOSIS — M25671 Stiffness of right ankle, not elsewhere classified: Secondary | ICD-10-CM | POA: Diagnosis not present

## 2019-02-13 DIAGNOSIS — R262 Difficulty in walking, not elsewhere classified: Secondary | ICD-10-CM | POA: Diagnosis not present

## 2019-02-13 DIAGNOSIS — M25671 Stiffness of right ankle, not elsewhere classified: Secondary | ICD-10-CM | POA: Diagnosis not present

## 2019-02-13 DIAGNOSIS — S82851D Displaced trimalleolar fracture of right lower leg, subsequent encounter for closed fracture with routine healing: Secondary | ICD-10-CM | POA: Diagnosis not present

## 2019-02-19 DIAGNOSIS — R262 Difficulty in walking, not elsewhere classified: Secondary | ICD-10-CM | POA: Diagnosis not present

## 2019-02-19 DIAGNOSIS — S82851D Displaced trimalleolar fracture of right lower leg, subsequent encounter for closed fracture with routine healing: Secondary | ICD-10-CM | POA: Diagnosis not present

## 2019-02-19 DIAGNOSIS — M25671 Stiffness of right ankle, not elsewhere classified: Secondary | ICD-10-CM | POA: Diagnosis not present

## 2019-02-21 DIAGNOSIS — S82851D Displaced trimalleolar fracture of right lower leg, subsequent encounter for closed fracture with routine healing: Secondary | ICD-10-CM | POA: Diagnosis not present

## 2019-02-21 DIAGNOSIS — M25672 Stiffness of left ankle, not elsewhere classified: Secondary | ICD-10-CM | POA: Diagnosis not present

## 2019-02-21 DIAGNOSIS — R262 Difficulty in walking, not elsewhere classified: Secondary | ICD-10-CM | POA: Diagnosis not present

## 2019-02-26 DIAGNOSIS — F4323 Adjustment disorder with mixed anxiety and depressed mood: Secondary | ICD-10-CM | POA: Diagnosis not present

## 2019-02-26 DIAGNOSIS — R262 Difficulty in walking, not elsewhere classified: Secondary | ICD-10-CM | POA: Diagnosis not present

## 2019-02-26 DIAGNOSIS — S82851D Displaced trimalleolar fracture of right lower leg, subsequent encounter for closed fracture with routine healing: Secondary | ICD-10-CM | POA: Diagnosis not present

## 2019-02-26 DIAGNOSIS — M25671 Stiffness of right ankle, not elsewhere classified: Secondary | ICD-10-CM | POA: Diagnosis not present

## 2019-03-01 DIAGNOSIS — M25671 Stiffness of right ankle, not elsewhere classified: Secondary | ICD-10-CM | POA: Diagnosis not present

## 2019-03-01 DIAGNOSIS — S82851D Displaced trimalleolar fracture of right lower leg, subsequent encounter for closed fracture with routine healing: Secondary | ICD-10-CM | POA: Diagnosis not present

## 2019-03-01 DIAGNOSIS — R262 Difficulty in walking, not elsewhere classified: Secondary | ICD-10-CM | POA: Diagnosis not present

## 2019-03-04 DIAGNOSIS — M25671 Stiffness of right ankle, not elsewhere classified: Secondary | ICD-10-CM | POA: Diagnosis not present

## 2019-03-04 DIAGNOSIS — S82851D Displaced trimalleolar fracture of right lower leg, subsequent encounter for closed fracture with routine healing: Secondary | ICD-10-CM | POA: Diagnosis not present

## 2019-03-04 DIAGNOSIS — R262 Difficulty in walking, not elsewhere classified: Secondary | ICD-10-CM | POA: Diagnosis not present

## 2019-03-06 DIAGNOSIS — R262 Difficulty in walking, not elsewhere classified: Secondary | ICD-10-CM | POA: Diagnosis not present

## 2019-03-06 DIAGNOSIS — M25671 Stiffness of right ankle, not elsewhere classified: Secondary | ICD-10-CM | POA: Diagnosis not present

## 2019-03-06 DIAGNOSIS — S82851D Displaced trimalleolar fracture of right lower leg, subsequent encounter for closed fracture with routine healing: Secondary | ICD-10-CM | POA: Diagnosis not present

## 2019-03-08 DIAGNOSIS — R262 Difficulty in walking, not elsewhere classified: Secondary | ICD-10-CM | POA: Diagnosis not present

## 2019-03-08 DIAGNOSIS — M25671 Stiffness of right ankle, not elsewhere classified: Secondary | ICD-10-CM | POA: Diagnosis not present

## 2019-03-08 DIAGNOSIS — S82851D Displaced trimalleolar fracture of right lower leg, subsequent encounter for closed fracture with routine healing: Secondary | ICD-10-CM | POA: Diagnosis not present

## 2019-03-11 DIAGNOSIS — S99911A Unspecified injury of right ankle, initial encounter: Secondary | ICD-10-CM | POA: Diagnosis not present

## 2019-03-11 DIAGNOSIS — R262 Difficulty in walking, not elsewhere classified: Secondary | ICD-10-CM | POA: Diagnosis not present

## 2019-03-11 DIAGNOSIS — S82851D Displaced trimalleolar fracture of right lower leg, subsequent encounter for closed fracture with routine healing: Secondary | ICD-10-CM | POA: Diagnosis not present

## 2019-03-11 DIAGNOSIS — M25671 Stiffness of right ankle, not elsewhere classified: Secondary | ICD-10-CM | POA: Diagnosis not present

## 2019-03-12 DIAGNOSIS — M25671 Stiffness of right ankle, not elsewhere classified: Secondary | ICD-10-CM | POA: Diagnosis not present

## 2019-03-18 DIAGNOSIS — R262 Difficulty in walking, not elsewhere classified: Secondary | ICD-10-CM | POA: Diagnosis not present

## 2019-03-18 DIAGNOSIS — S82851D Displaced trimalleolar fracture of right lower leg, subsequent encounter for closed fracture with routine healing: Secondary | ICD-10-CM | POA: Diagnosis not present

## 2019-03-18 DIAGNOSIS — M25671 Stiffness of right ankle, not elsewhere classified: Secondary | ICD-10-CM | POA: Diagnosis not present

## 2019-03-19 ENCOUNTER — Ambulatory Visit: Payer: BC Managed Care – PPO | Attending: Internal Medicine

## 2019-03-19 DIAGNOSIS — Z20828 Contact with and (suspected) exposure to other viral communicable diseases: Secondary | ICD-10-CM | POA: Diagnosis not present

## 2019-03-19 DIAGNOSIS — Z20822 Contact with and (suspected) exposure to covid-19: Secondary | ICD-10-CM

## 2019-03-20 DIAGNOSIS — M25671 Stiffness of right ankle, not elsewhere classified: Secondary | ICD-10-CM | POA: Diagnosis not present

## 2019-03-20 DIAGNOSIS — S82851D Displaced trimalleolar fracture of right lower leg, subsequent encounter for closed fracture with routine healing: Secondary | ICD-10-CM | POA: Diagnosis not present

## 2019-03-20 DIAGNOSIS — R262 Difficulty in walking, not elsewhere classified: Secondary | ICD-10-CM | POA: Diagnosis not present

## 2019-03-20 LAB — NOVEL CORONAVIRUS, NAA: SARS-CoV-2, NAA: NOT DETECTED

## 2019-03-27 ENCOUNTER — Ambulatory Visit: Payer: BC Managed Care – PPO | Attending: Internal Medicine

## 2019-03-27 DIAGNOSIS — Z20822 Contact with and (suspected) exposure to covid-19: Secondary | ICD-10-CM | POA: Diagnosis not present

## 2019-03-29 LAB — NOVEL CORONAVIRUS, NAA: SARS-CoV-2, NAA: NOT DETECTED

## 2019-05-14 DIAGNOSIS — S82251D Displaced comminuted fracture of shaft of right tibia, subsequent encounter for closed fracture with routine healing: Secondary | ICD-10-CM | POA: Diagnosis not present

## 2019-05-14 DIAGNOSIS — R262 Difficulty in walking, not elsewhere classified: Secondary | ICD-10-CM | POA: Diagnosis not present

## 2019-05-14 DIAGNOSIS — M25671 Stiffness of right ankle, not elsewhere classified: Secondary | ICD-10-CM | POA: Diagnosis not present

## 2019-05-21 DIAGNOSIS — M25671 Stiffness of right ankle, not elsewhere classified: Secondary | ICD-10-CM | POA: Diagnosis not present

## 2019-05-21 DIAGNOSIS — R262 Difficulty in walking, not elsewhere classified: Secondary | ICD-10-CM | POA: Diagnosis not present

## 2019-05-21 DIAGNOSIS — S82851D Displaced trimalleolar fracture of right lower leg, subsequent encounter for closed fracture with routine healing: Secondary | ICD-10-CM | POA: Diagnosis not present

## 2019-05-24 DIAGNOSIS — M25671 Stiffness of right ankle, not elsewhere classified: Secondary | ICD-10-CM | POA: Diagnosis not present

## 2019-05-28 DIAGNOSIS — S82851D Displaced trimalleolar fracture of right lower leg, subsequent encounter for closed fracture with routine healing: Secondary | ICD-10-CM | POA: Diagnosis not present

## 2019-05-28 DIAGNOSIS — R262 Difficulty in walking, not elsewhere classified: Secondary | ICD-10-CM | POA: Diagnosis not present

## 2019-05-28 DIAGNOSIS — M25671 Stiffness of right ankle, not elsewhere classified: Secondary | ICD-10-CM | POA: Diagnosis not present

## 2019-05-30 DIAGNOSIS — S82851D Displaced trimalleolar fracture of right lower leg, subsequent encounter for closed fracture with routine healing: Secondary | ICD-10-CM | POA: Diagnosis not present

## 2019-05-30 DIAGNOSIS — R262 Difficulty in walking, not elsewhere classified: Secondary | ICD-10-CM | POA: Diagnosis not present

## 2019-05-30 DIAGNOSIS — M25671 Stiffness of right ankle, not elsewhere classified: Secondary | ICD-10-CM | POA: Diagnosis not present

## 2019-06-03 DIAGNOSIS — J449 Chronic obstructive pulmonary disease, unspecified: Secondary | ICD-10-CM | POA: Diagnosis not present

## 2019-06-03 DIAGNOSIS — E7800 Pure hypercholesterolemia, unspecified: Secondary | ICD-10-CM | POA: Diagnosis not present

## 2019-06-03 DIAGNOSIS — F17200 Nicotine dependence, unspecified, uncomplicated: Secondary | ICD-10-CM | POA: Diagnosis not present

## 2019-06-03 DIAGNOSIS — Z1331 Encounter for screening for depression: Secondary | ICD-10-CM | POA: Diagnosis not present

## 2019-06-04 DIAGNOSIS — R262 Difficulty in walking, not elsewhere classified: Secondary | ICD-10-CM | POA: Diagnosis not present

## 2019-06-04 DIAGNOSIS — S82851D Displaced trimalleolar fracture of right lower leg, subsequent encounter for closed fracture with routine healing: Secondary | ICD-10-CM | POA: Diagnosis not present

## 2019-06-04 DIAGNOSIS — M25671 Stiffness of right ankle, not elsewhere classified: Secondary | ICD-10-CM | POA: Diagnosis not present

## 2019-06-06 DIAGNOSIS — S82851D Displaced trimalleolar fracture of right lower leg, subsequent encounter for closed fracture with routine healing: Secondary | ICD-10-CM | POA: Diagnosis not present

## 2019-06-06 DIAGNOSIS — R262 Difficulty in walking, not elsewhere classified: Secondary | ICD-10-CM | POA: Diagnosis not present

## 2019-06-06 DIAGNOSIS — M25671 Stiffness of right ankle, not elsewhere classified: Secondary | ICD-10-CM | POA: Diagnosis not present

## 2019-06-11 DIAGNOSIS — S82851D Displaced trimalleolar fracture of right lower leg, subsequent encounter for closed fracture with routine healing: Secondary | ICD-10-CM | POA: Diagnosis not present

## 2019-06-11 DIAGNOSIS — M25671 Stiffness of right ankle, not elsewhere classified: Secondary | ICD-10-CM | POA: Diagnosis not present

## 2019-06-11 DIAGNOSIS — R262 Difficulty in walking, not elsewhere classified: Secondary | ICD-10-CM | POA: Diagnosis not present

## 2019-06-13 DIAGNOSIS — R262 Difficulty in walking, not elsewhere classified: Secondary | ICD-10-CM | POA: Diagnosis not present

## 2019-06-13 DIAGNOSIS — S82851D Displaced trimalleolar fracture of right lower leg, subsequent encounter for closed fracture with routine healing: Secondary | ICD-10-CM | POA: Diagnosis not present

## 2019-06-13 DIAGNOSIS — M25671 Stiffness of right ankle, not elsewhere classified: Secondary | ICD-10-CM | POA: Diagnosis not present

## 2019-06-25 DIAGNOSIS — R262 Difficulty in walking, not elsewhere classified: Secondary | ICD-10-CM | POA: Diagnosis not present

## 2019-06-25 DIAGNOSIS — S82851D Displaced trimalleolar fracture of right lower leg, subsequent encounter for closed fracture with routine healing: Secondary | ICD-10-CM | POA: Diagnosis not present

## 2019-06-25 DIAGNOSIS — M25671 Stiffness of right ankle, not elsewhere classified: Secondary | ICD-10-CM | POA: Diagnosis not present

## 2019-07-02 DIAGNOSIS — R030 Elevated blood-pressure reading, without diagnosis of hypertension: Secondary | ICD-10-CM | POA: Diagnosis not present

## 2019-08-03 DIAGNOSIS — S82891A Other fracture of right lower leg, initial encounter for closed fracture: Secondary | ICD-10-CM | POA: Diagnosis not present

## 2019-08-03 DIAGNOSIS — S335XXA Sprain of ligaments of lumbar spine, initial encounter: Secondary | ICD-10-CM | POA: Diagnosis not present

## 2019-08-06 DIAGNOSIS — M5441 Lumbago with sciatica, right side: Secondary | ICD-10-CM | POA: Diagnosis not present

## 2019-08-06 DIAGNOSIS — M9903 Segmental and somatic dysfunction of lumbar region: Secondary | ICD-10-CM | POA: Diagnosis not present

## 2019-08-12 DIAGNOSIS — M9903 Segmental and somatic dysfunction of lumbar region: Secondary | ICD-10-CM | POA: Diagnosis not present

## 2019-08-12 DIAGNOSIS — M5441 Lumbago with sciatica, right side: Secondary | ICD-10-CM | POA: Diagnosis not present

## 2019-08-13 DIAGNOSIS — M9903 Segmental and somatic dysfunction of lumbar region: Secondary | ICD-10-CM | POA: Diagnosis not present

## 2019-08-13 DIAGNOSIS — M5441 Lumbago with sciatica, right side: Secondary | ICD-10-CM | POA: Diagnosis not present

## 2019-08-14 DIAGNOSIS — M5441 Lumbago with sciatica, right side: Secondary | ICD-10-CM | POA: Diagnosis not present

## 2019-08-14 DIAGNOSIS — M9903 Segmental and somatic dysfunction of lumbar region: Secondary | ICD-10-CM | POA: Diagnosis not present

## 2019-08-15 ENCOUNTER — Other Ambulatory Visit: Payer: Self-pay | Admitting: Obstetrics and Gynecology

## 2019-08-15 DIAGNOSIS — Z6841 Body Mass Index (BMI) 40.0 and over, adult: Secondary | ICD-10-CM | POA: Diagnosis not present

## 2019-08-15 DIAGNOSIS — Z01419 Encounter for gynecological examination (general) (routine) without abnormal findings: Secondary | ICD-10-CM | POA: Diagnosis not present

## 2019-08-15 DIAGNOSIS — Z139 Encounter for screening, unspecified: Secondary | ICD-10-CM

## 2019-08-20 DIAGNOSIS — M9903 Segmental and somatic dysfunction of lumbar region: Secondary | ICD-10-CM | POA: Diagnosis not present

## 2019-08-20 DIAGNOSIS — M5441 Lumbago with sciatica, right side: Secondary | ICD-10-CM | POA: Diagnosis not present

## 2019-08-21 DIAGNOSIS — M5441 Lumbago with sciatica, right side: Secondary | ICD-10-CM | POA: Diagnosis not present

## 2019-08-21 DIAGNOSIS — M9903 Segmental and somatic dysfunction of lumbar region: Secondary | ICD-10-CM | POA: Diagnosis not present

## 2019-08-27 DIAGNOSIS — M5441 Lumbago with sciatica, right side: Secondary | ICD-10-CM | POA: Diagnosis not present

## 2019-08-27 DIAGNOSIS — M9903 Segmental and somatic dysfunction of lumbar region: Secondary | ICD-10-CM | POA: Diagnosis not present

## 2019-08-28 DIAGNOSIS — M9903 Segmental and somatic dysfunction of lumbar region: Secondary | ICD-10-CM | POA: Diagnosis not present

## 2019-08-28 DIAGNOSIS — M5441 Lumbago with sciatica, right side: Secondary | ICD-10-CM | POA: Diagnosis not present

## 2019-09-02 DIAGNOSIS — M9903 Segmental and somatic dysfunction of lumbar region: Secondary | ICD-10-CM | POA: Diagnosis not present

## 2019-09-02 DIAGNOSIS — M5441 Lumbago with sciatica, right side: Secondary | ICD-10-CM | POA: Diagnosis not present

## 2019-09-03 DIAGNOSIS — M9901 Segmental and somatic dysfunction of cervical region: Secondary | ICD-10-CM | POA: Diagnosis not present

## 2019-09-03 DIAGNOSIS — M9903 Segmental and somatic dysfunction of lumbar region: Secondary | ICD-10-CM | POA: Diagnosis not present

## 2019-09-03 DIAGNOSIS — M5441 Lumbago with sciatica, right side: Secondary | ICD-10-CM | POA: Diagnosis not present

## 2019-09-03 DIAGNOSIS — M530 Cervicocranial syndrome: Secondary | ICD-10-CM | POA: Diagnosis not present

## 2019-09-04 DIAGNOSIS — M5441 Lumbago with sciatica, right side: Secondary | ICD-10-CM | POA: Diagnosis not present

## 2019-09-04 DIAGNOSIS — M9903 Segmental and somatic dysfunction of lumbar region: Secondary | ICD-10-CM | POA: Diagnosis not present

## 2019-09-09 DIAGNOSIS — J029 Acute pharyngitis, unspecified: Secondary | ICD-10-CM | POA: Diagnosis not present

## 2019-09-10 DIAGNOSIS — M5441 Lumbago with sciatica, right side: Secondary | ICD-10-CM | POA: Diagnosis not present

## 2019-09-10 DIAGNOSIS — M9903 Segmental and somatic dysfunction of lumbar region: Secondary | ICD-10-CM | POA: Diagnosis not present

## 2019-09-11 DIAGNOSIS — M9901 Segmental and somatic dysfunction of cervical region: Secondary | ICD-10-CM | POA: Diagnosis not present

## 2019-09-11 DIAGNOSIS — M9903 Segmental and somatic dysfunction of lumbar region: Secondary | ICD-10-CM | POA: Diagnosis not present

## 2019-09-11 DIAGNOSIS — M530 Cervicocranial syndrome: Secondary | ICD-10-CM | POA: Diagnosis not present

## 2019-09-11 DIAGNOSIS — M5441 Lumbago with sciatica, right side: Secondary | ICD-10-CM | POA: Diagnosis not present

## 2019-11-20 DIAGNOSIS — U071 COVID-19: Secondary | ICD-10-CM

## 2019-11-20 HISTORY — DX: COVID-19: U07.1

## 2019-11-27 DIAGNOSIS — E7800 Pure hypercholesterolemia, unspecified: Secondary | ICD-10-CM | POA: Diagnosis not present

## 2019-11-27 DIAGNOSIS — Z0000 Encounter for general adult medical examination without abnormal findings: Secondary | ICD-10-CM | POA: Diagnosis not present

## 2019-12-05 DIAGNOSIS — R030 Elevated blood-pressure reading, without diagnosis of hypertension: Secondary | ICD-10-CM | POA: Diagnosis not present

## 2019-12-05 DIAGNOSIS — Z23 Encounter for immunization: Secondary | ICD-10-CM | POA: Diagnosis not present

## 2019-12-05 DIAGNOSIS — U071 COVID-19: Secondary | ICD-10-CM | POA: Diagnosis not present

## 2019-12-05 DIAGNOSIS — R05 Cough: Secondary | ICD-10-CM | POA: Diagnosis not present

## 2019-12-05 DIAGNOSIS — Z0000 Encounter for general adult medical examination without abnormal findings: Secondary | ICD-10-CM | POA: Diagnosis not present

## 2019-12-18 ENCOUNTER — Other Ambulatory Visit: Payer: Self-pay

## 2019-12-18 ENCOUNTER — Encounter: Payer: Self-pay | Admitting: Gastroenterology

## 2019-12-18 ENCOUNTER — Ambulatory Visit (AMBULATORY_SURGERY_CENTER): Payer: BC Managed Care – PPO | Admitting: *Deleted

## 2019-12-18 VITALS — Ht 63.0 in | Wt 247.0 lb

## 2019-12-18 DIAGNOSIS — Z1509 Genetic susceptibility to other malignant neoplasm: Secondary | ICD-10-CM

## 2019-12-18 DIAGNOSIS — Z86010 Personal history of colonic polyps: Secondary | ICD-10-CM

## 2019-12-18 MED ORDER — NA SULFATE-K SULFATE-MG SULF 17.5-3.13-1.6 GM/177ML PO SOLN: ORAL | 0 refills | Status: DC

## 2019-12-18 NOTE — Progress Notes (Signed)
Patient's pre-visit was done today over the phone with the patient due to COVID-19 pandemic. Name,DOB and address verified. Insurance verified. Packet of Prep instructions mailed to patient including copy of a consent form and pre-procedure patient acknowledgement form-pt is aware. suprep Coupon included. Patient understands to call us back with any questions or concerns. COVID-19 vaccines completed May 24,2021 per pt. Patient is aware of the covid policy must be 10 days w/ no symptoms.  Pt is aware that care partner will wait in the car during procedure; if they feel like they will be too hot or cold to wait in the car; they may wait in the 4 th floor lobby. Patient is aware to bring only one care partner. We want them to wear a mask (we do not have any that we can provide them), practice social distancing, and we will check their temperatures when they get here.  I did remind the patient that their care partner needs to stay in the parking lot the entire time and have a cell phone available, we will call them when the pt is ready for discharge. Patient will wear mask into building.

## 2019-12-24 ENCOUNTER — Telehealth: Payer: Self-pay | Admitting: Gastroenterology

## 2019-12-24 NOTE — Telephone Encounter (Signed)
[facility] pharmacy technician states Rx for Suprep would be $133 without discount code and $97 with code. Plenvu, Moviprep, or Clenpiq are not on insurance formulary. Patient was advised of Miralax prep.  Instructions of Miralax prep sent to patient in [place].

## 2020-01-01 ENCOUNTER — Ambulatory Visit (AMBULATORY_SURGERY_CENTER): Payer: BC Managed Care – PPO | Admitting: Gastroenterology

## 2020-01-01 ENCOUNTER — Other Ambulatory Visit: Payer: Self-pay

## 2020-01-01 ENCOUNTER — Encounter: Payer: Self-pay | Admitting: Gastroenterology

## 2020-01-01 VITALS — BP 137/71 | HR 83 | Temp 97.1°F | Resp 19 | Ht 63.0 in | Wt 247.0 lb

## 2020-01-01 DIAGNOSIS — Z1211 Encounter for screening for malignant neoplasm of colon: Secondary | ICD-10-CM | POA: Diagnosis not present

## 2020-01-01 DIAGNOSIS — K449 Diaphragmatic hernia without obstruction or gangrene: Secondary | ICD-10-CM | POA: Diagnosis not present

## 2020-01-01 DIAGNOSIS — Z1509 Genetic susceptibility to other malignant neoplasm: Secondary | ICD-10-CM | POA: Diagnosis not present

## 2020-01-01 DIAGNOSIS — Z86010 Personal history of colonic polyps: Secondary | ICD-10-CM | POA: Diagnosis not present

## 2020-01-01 MED ORDER — SODIUM CHLORIDE 0.9 % IV SOLN: 500.0000 mL | Freq: Once | INTRAVENOUS | Status: DC

## 2020-01-01 NOTE — Patient Instructions (Signed)
Handouts provided on hiatal hernia and diverticulosis.   Repeat upper endoscopy in 3 years for surveillance.   Repeat colonoscopy in 1 year for surveillance.  YOU HAD AN ENDOSCOPIC PROCEDURE TODAY AT [facility]:   Refer to the procedure report that was given to you for any specific questions about what was found during the examination.  If the procedure report does not answer your questions, please call your gastroenterologist to clarify.  If you requested that your care partner not be given the details of your procedure findings, then the procedure report has been included in a sealed envelope for you to review at your convenience later.  YOU SHOULD EXPECT: Some feelings of bloating in the abdomen. Passage of more gas than usual.  Walking can help get rid of the air that was put into your GI tract during the procedure and reduce the bloating. If you had a lower endoscopy (such as a colonoscopy or flexible sigmoidoscopy) you may notice spotting of blood in your stool or on the toilet paper. If you underwent a bowel prep for your procedure, you may not have a normal bowel movement for a few days.  Please Note:  You might notice some irritation and congestion in your nose or some drainage.  This is from the oxygen used during your procedure.  There is no need for concern and it should clear up in a day or so.  SYMPTOMS TO REPORT IMMEDIATELY:   Following lower endoscopy (colonoscopy or flexible sigmoidoscopy):  Excessive amounts of blood in the stool  Significant tenderness or worsening of abdominal pains  Swelling of the abdomen that is new, acute  Fever of 100F or higher   Following upper endoscopy (EGD)  Vomiting of blood or coffee ground material  New chest pain or pain under the shoulder blades  Painful or persistently difficult swallowing  New shortness of breath  Fever of 100F or higher  Black, tarry-looking stools  For urgent or emergent issues, a  gastroenterologist can be reached at any hour by calling [phone]. Do not use MyChart messaging for urgent concerns.    DIET:  We do recommend a small meal at first, but then you may proceed to your regular diet.  Drink plenty of fluids but you should avoid alcoholic beverages for 24 hours.  ACTIVITY:  You should plan to take it easy for the rest of today and you should NOT DRIVE or use heavy machinery until tomorrow (because of the sedation medicines used during the test).    FOLLOW UP: Our staff will call the number listed on your records 48-72 hours following your procedure to check on you and address any questions or concerns that you may have regarding the information given to you following your procedure. If we do not reach you, we will leave a message.  We will attempt to reach you two times.  During this call, we will ask if you have developed any symptoms of COVID 19. If you develop any symptoms (ie: fever, flu-like symptoms, shortness of breath, cough etc.) before then, please call [phone].  If you test positive for Covid 19 in the 2 weeks post procedure, please call and report this information to [place].    If any biopsies were taken you will be contacted by phone or by letter within the next 1-3 weeks.  Please call us at [phone] if you have not heard about the biopsies in 3 weeks.    SIGNATURES/CONFIDENTIALITY: You and/or your  care partner have signed paperwork which will be entered into your electronic medical record.  These signatures attest to the fact that that the information above on your After Visit Summary has been reviewed and is understood.  Full responsibility of the confidentiality of this discharge information lies with you and/or your care-partner.

## 2020-01-01 NOTE — Progress Notes (Signed)
Report given to PACU, vss 

## 2020-01-01 NOTE — Op Note (Signed)
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 Ardis Hughs found                            several tubular adenomas and sessile serrated                            adenomas. Colonoscopy May 2019 Dr. Ardis Hughs found 4                            subCM polyps, 3 of them were adenomas. Colonoscopy                            08/2018 two subCM adenomas Medicines:                Monitored Anesthesia Care Procedure:                Pre-Anesthesia Assessment:                           - Prior to the procedure, a History and Physical                            was performed, and patient medications and                            allergies were reviewed. The patient's tolerance of                            previous anesthesia was also reviewed. The risks                            and benefits of the procedure and the sedation                            options and risks were discussed with the patient.                            All questions were answered, and informed consent                            was obtained. Prior Anticoagulants: The patient has                            taken no previous anticoagulant or antiplatelet  agents. ASA Grade Assessment: II - A patient with                            mild systemic disease. After reviewing the risks                            and benefits, the patient was deemed in                            satisfactory condition to undergo the procedure.                           After obtaining informed consent, the colonoscope                            was passed under direct vision.  Throughout the                            procedure, the patient's blood pressure, pulse, and                            oxygen saturations were monitored continuously. The                            Colonoscope was introduced through the anus and                            advanced to the the cecum, identified by                            appendiceal orifice and ileocecal valve. The                            colonoscopy was performed without difficulty. The                            patient tolerated the procedure well. The quality                            of the bowel preparation was good. The ileocecal                            valve, appendiceal orifice, and rectum were                            photographed. Scope In: 1:29:14 PM Scope Out: 1:40:10 PM Scope Withdrawal Time: 0 hours 8 minutes 30 seconds  Total Procedure Duration: 0 hours 10 minutes 56 seconds  Findings:                 Multiple small-mouthed diverticula were found in                            the entire colon.                             The exam was otherwise without abnormality on                            direct and retroflexion views. Complications:            No immediate complications. Estimated blood loss:                            None. Estimated Blood Loss:     Estimated blood loss: none. Impression:               - Diverticulosis in the entire examined colon.                           - The examination was otherwise normal on direct                            and retroflexion views.                           - No polyps or cancers. Recommendation:           - EGD now.                           - Repeat colonoscopy in 1 year for surveillance. Milus Banister, MD 01/01/2020 1:47:40 PM This report has been signed electronically.

## 2020-01-01 NOTE — Progress Notes (Signed)
Pt's states no medical or surgical changes since previsit or office visit. 

## 2020-01-01 NOTE — Progress Notes (Signed)
1324 Robinul 0.1 mg IV given due large amount of secretions upon assessment.  MD made aware, vss 

## 2020-01-01 NOTE — Op Note (Signed)
[facility] Patient Name: Lynn Simpson Procedure Date: 01/01/2020 1:22 PM MRN: [id number] Endoscopist: Milus Banister , MD Age: 49 Referring MD:  Date of Birth: January 24, 1971 Gender: Female Account #: [id number] Procedure:                Upper GI endoscopy Indications:              LYNCH syndrome (PMS2 mutation) Medicines:                Monitored Anesthesia Care Procedure:                Pre-Anesthesia Assessment:                           - Prior to the procedure, a History and Physical                            was performed, and patient medications and                            allergies were reviewed. The patient's tolerance of                            previous anesthesia was also reviewed. The risks                            and benefits of the procedure and the sedation                            options and risks were discussed with the patient.                            All questions were answered, and informed consent                            was obtained. Prior Anticoagulants: The patient has                            taken no previous anticoagulant or antiplatelet                            agents. ASA Grade Assessment: II - A patient with                            mild systemic disease. After reviewing the risks                            and benefits, the patient was deemed in                            satisfactory condition to undergo the procedure.                           After obtaining informed consent, the endoscope was  passed under direct vision. Throughout the                            procedure, the patient's blood pressure, pulse, and                            oxygen saturations were monitored continuously. The                            Endoscope was introduced through the mouth, and                            advanced to the second part of duodenum. The upper                            GI endoscopy was  accomplished without difficulty.                            The patient tolerated the procedure well. Scope In: Scope Out: Findings:                 A small hiatal hernia was present.                           The exam was otherwise without abnormality. Complications:            No immediate complications. Estimated blood loss:                            None. Estimated Blood Loss:     Estimated blood loss: none. Impression:               - Small hiatal hernia.                           - The examination was otherwise normal. Recommendation:           - Patient has a contact number available for                            emergencies. The signs and symptoms of potential                            delayed complications were discussed with the                            patient. Return to normal activities tomorrow.                            Written discharge instructions were provided to the                            patient.                           - Resume previous diet.                           -  Continue present medications.                           - Repeat upper endoscopy in 3 years for                            surveillance. Milus Banister, MD 01/01/2020 1:50:51 PM This report has been signed electronically.

## 2020-01-01 NOTE — Progress Notes (Signed)
C.W. vital signs. 

## 2020-01-03 ENCOUNTER — Telehealth: Payer: Self-pay

## 2020-01-03 NOTE — Telephone Encounter (Signed)
  Follow up Call-  Call back number 01/01/2020 08/28/2018 07/25/2017  Post procedure Call Back phone  # [phone]  Permission to leave phone message Yes Yes Yes  Some recent data might be hidden     Patient questions:  Do you have a fever, pain , or abdominal swelling? No. Pain Score  0 *  Have you tolerated food without any problems? Yes.    Have you been able to return to your normal activities? Yes.    Do you have any questions about your discharge instructions: Diet   No. Medications  No. Follow up visit  No.  Do you have questions or concerns about your Care? No.  Actions: * If pain score is 4 or above: No action needed, pain <4.  1. Have you developed a fever since your procedure? no  2.   Have you had an respiratory symptoms (SOB or cough) since your procedure? no  3.   Have you tested positive for COVID 19 since your procedure no  4.   Have you had any family members/close contacts diagnosed with the COVID 19 since your procedure?  no   If yes to any of these questions please route to Joylene John, RN and Joella Prince, RN

## 2020-01-07 DIAGNOSIS — R030 Elevated blood-pressure reading, without diagnosis of hypertension: Secondary | ICD-10-CM | POA: Diagnosis not present

## 2020-03-01 DIAGNOSIS — R059 Cough, unspecified: Secondary | ICD-10-CM | POA: Diagnosis not present

## 2020-03-02 DIAGNOSIS — F17200 Nicotine dependence, unspecified, uncomplicated: Secondary | ICD-10-CM | POA: Diagnosis not present

## 2020-03-02 DIAGNOSIS — R059 Cough, unspecified: Secondary | ICD-10-CM | POA: Diagnosis not present

## 2020-04-08 DIAGNOSIS — R030 Elevated blood-pressure reading, without diagnosis of hypertension: Secondary | ICD-10-CM | POA: Diagnosis not present

## 2020-06-03 DIAGNOSIS — Z1509 Genetic susceptibility to other malignant neoplasm: Secondary | ICD-10-CM | POA: Diagnosis not present

## 2020-06-03 DIAGNOSIS — Z1339 Encounter for screening examination for other mental health and behavioral disorders: Secondary | ICD-10-CM | POA: Diagnosis not present

## 2020-06-03 DIAGNOSIS — R030 Elevated blood-pressure reading, without diagnosis of hypertension: Secondary | ICD-10-CM | POA: Diagnosis not present

## 2020-06-03 DIAGNOSIS — F4323 Adjustment disorder with mixed anxiety and depressed mood: Secondary | ICD-10-CM | POA: Diagnosis not present

## 2020-06-03 DIAGNOSIS — Z1331 Encounter for screening for depression: Secondary | ICD-10-CM | POA: Diagnosis not present

## 2020-11-03 ENCOUNTER — Encounter: Payer: Self-pay | Admitting: Gastroenterology

## 2020-11-25 DIAGNOSIS — R5383 Other fatigue: Secondary | ICD-10-CM | POA: Diagnosis not present

## 2020-11-25 DIAGNOSIS — J0100 Acute maxillary sinusitis, unspecified: Secondary | ICD-10-CM | POA: Diagnosis not present

## 2020-12-09 DIAGNOSIS — E7800 Pure hypercholesterolemia, unspecified: Secondary | ICD-10-CM | POA: Diagnosis not present

## 2020-12-16 DIAGNOSIS — Z1331 Encounter for screening for depression: Secondary | ICD-10-CM | POA: Diagnosis not present

## 2020-12-16 DIAGNOSIS — Z23 Encounter for immunization: Secondary | ICD-10-CM | POA: Diagnosis not present

## 2020-12-16 DIAGNOSIS — Z1339 Encounter for screening examination for other mental health and behavioral disorders: Secondary | ICD-10-CM | POA: Diagnosis not present

## 2020-12-16 DIAGNOSIS — R030 Elevated blood-pressure reading, without diagnosis of hypertension: Secondary | ICD-10-CM | POA: Diagnosis not present

## 2020-12-16 DIAGNOSIS — Z0000 Encounter for general adult medical examination without abnormal findings: Secondary | ICD-10-CM | POA: Diagnosis not present

## 2020-12-28 ENCOUNTER — Telehealth: Payer: Self-pay | Admitting: Gastroenterology

## 2020-12-28 NOTE — Telephone Encounter (Signed)
Patient called states she has been having a lot of abdominal pain and is now doing a liquid diet because anything she eats makes her stomach hurt a lot seeking advise.

## 2020-12-28 NOTE — Telephone Encounter (Signed)
The pt states that since memorial day she has had a stabbing pain in the upper abd and nausea that comes and goes.  She says the pain was so bad on several occasions she passed out.  She has not seen anyone for these symptoms.  I have her set up for an appt with Colleen on Thursday. She has an appt already scheduled for a colonoscopy in November.   She has been advised that she should go to the ED or urgent care if she develops the severe symptoms again.  The pt has been advised of the information and verbalized understanding.

## 2020-12-31 ENCOUNTER — Other Ambulatory Visit (INDEPENDENT_AMBULATORY_CARE_PROVIDER_SITE_OTHER): Payer: BC Managed Care – PPO

## 2020-12-31 ENCOUNTER — Ambulatory Visit: Payer: BC Managed Care – PPO | Admitting: Nurse Practitioner

## 2020-12-31 ENCOUNTER — Encounter: Payer: Self-pay | Admitting: Nurse Practitioner

## 2020-12-31 VITALS — BP 126/84 | HR 99 | Ht 62.0 in | Wt 265.4 lb

## 2020-12-31 DIAGNOSIS — A048 Other specified bacterial intestinal infections: Secondary | ICD-10-CM

## 2020-12-31 DIAGNOSIS — R112 Nausea with vomiting, unspecified: Secondary | ICD-10-CM

## 2020-12-31 DIAGNOSIS — R197 Diarrhea, unspecified: Secondary | ICD-10-CM

## 2020-12-31 DIAGNOSIS — Z86010 Personal history of colonic polyps: Secondary | ICD-10-CM

## 2020-12-31 DIAGNOSIS — R1012 Left upper quadrant pain: Secondary | ICD-10-CM

## 2020-12-31 LAB — LIPASE: Lipase: 46 U/L (ref 11.0–59.0)

## 2020-12-31 LAB — CBC WITH DIFFERENTIAL/PLATELET
Basophils Absolute: 0 10*3/uL (ref 0.0–0.1)
Basophils Relative: 0.5 % (ref 0.0–3.0)
Eosinophils Absolute: 0.2 10*3/uL (ref 0.0–0.7)
Eosinophils Relative: 2.5 % (ref 0.0–5.0)
HCT: 45 % (ref 36.0–46.0)
Hemoglobin: 15.5 g/dL — ABNORMAL HIGH (ref 12.0–15.0)
Lymphocytes Relative: 33.4 % (ref 12.0–46.0)
Lymphs Abs: 3.2 10*3/uL (ref 0.7–4.0)
MCHC: 34.3 g/dL (ref 30.0–36.0)
MCV: 89.7 fl (ref 78.0–100.0)
Monocytes Absolute: 0.5 10*3/uL (ref 0.1–1.0)
Monocytes Relative: 4.8 % (ref 3.0–12.0)
Neutro Abs: 5.7 10*3/uL (ref 1.4–7.7)
Neutrophils Relative %: 58.8 % (ref 43.0–77.0)
Platelets: 202 10*3/uL (ref 150.0–400.0)
RBC: 5.02 Mil/uL (ref 3.87–5.11)
RDW: 12.8 % (ref 11.5–15.5)
WBC: 9.7 10*3/uL (ref 4.0–10.5)

## 2020-12-31 LAB — COMPREHENSIVE METABOLIC PANEL
ALT: 17 U/L (ref 0–35)
AST: 19 U/L (ref 0–37)
Albumin: 4.6 g/dL (ref 3.5–5.2)
Alkaline Phosphatase: 91 U/L (ref 39–117)
BUN: 14 mg/dL (ref 6–23)
CO2: 30 mEq/L (ref 19–32)
Calcium: 9.9 mg/dL (ref 8.4–10.5)
Chloride: 101 mEq/L (ref 96–112)
Creatinine, Ser: 0.62 mg/dL (ref 0.40–1.20)
GFR: 105.32 mL/min (ref >60.00)
Glucose, Bld: 89 mg/dL (ref 70–99)
Potassium: 4.2 mEq/L (ref 3.5–5.1)
Sodium: 138 mEq/L (ref 135–145)
Total Bilirubin: 0.3 mg/dL (ref 0.2–1.2)
Total Protein: 7.6 g/dL (ref 6.0–8.3)

## 2020-12-31 LAB — C-REACTIVE PROTEIN: CRP: 1.2 mg/dL (ref 0.5–20.0)

## 2020-12-31 MED ORDER — HYOSCYAMINE SULFATE 0.125 MG SL SUBL: 0.1250 mg | SUBLINGUAL_TABLET | Freq: Four times a day (QID) | SUBLINGUAL | 1 refills | Status: DC | PRN

## 2020-12-31 MED ORDER — FAMOTIDINE 40 MG PO TABS
40.0000 mg | ORAL_TABLET | Freq: Every day | ORAL | 0 refills | Status: DC
Start: 2020-12-31 — End: 2021-01-22

## 2020-12-31 MED ORDER — ONDANSETRON 4 MG PO TBDP: ORAL_TABLET | ORAL | 0 refills | Status: DC

## 2020-12-31 NOTE — Progress Notes (Signed)
12/31/2020 Lynn Simpson [id number] [birth year]   Chief Complaint: LUQ pain   History of Present Illness: Lynn Simpson is a 50 female with a past medical history of anxiety, depression, asthma, preventative bilateral mastectomies 03/2016, Lynch syndrome, GERD,  diarrhea predominant IBS  and colon polyps. She is followed by Dr. Ardis Hughs. She presents to our office today for further evaluation regarding LUQ pain and to schedule a surveillance colonoscopy. She complains of having intermittent LUQ pain since Labor Day. She reported having 3 episodes when she had nausea/vomiting and diarrhea which resulted in passing out, briefly losing consciousness.   12/11/2020 she had nausea with LUQ pain, she went to the bathroom, passed nonbloody diarrhea then passed out, awakened wet with sweat then vomited up partially digested food. No hematemesis or coffee ground emesis.   12/21/2020 she had LUQ pain, had nausea, went to the bathroom became dizzy, grabbed the side of the bathtub and passed out, awakened and vomited partially digested food.   Sunday 12/27/2020 she had LUQ pain, felt like she needed to pass a BM, went to the bathroom, passed out and again awakened then vomited.   She has intermittent loose stools for the past few years. She sees a small amount of blood on the toilet tissue if she has numerous episodes of diarrhea.   She took Amoxicillin for 7 days prior to Labor Day due to having a sinus infection.  She previously took Celebrex daily for a 1 or 2 years for right ankle pain a few years ago. She recently took Celebrex for a few days for her LUQ pain without improvement. No fevers. No weight loss.   Her most recent EGD was 01/01/2020 which identified a hiatal hernia otherwise was normal. A repeat surveillance EGD was recommended in 3 years.   Her most recent surveillance colonoscopy was 01/01/2020 which showed diverticulosis throughout the colon, no polyps. A repeat colonoscopy in 1 year  was recommended secondary to  history of Lynch Syndrome.   Colonoscopy 2018 Dr. Ardis Hughs found several tubular adenomas and sessile serrated adenomas.   Colonoscopy May 2019 Dr. Ardis Hughs found 4 subcentimeter polyps, 3 of them were adenomas  Past Medical History:  Diagnosis Date   Allergy    Anxiety    Asthma    Back pain    Cancer ([facility]) 2018   Left Breast   COVID-19 virus infection 11/2019   Depression    Dyspnea    Family history of breast cancer    Family history of melanoma    Family history of ovarian cancer    Gallstones    GERD (gastroesophageal reflux disease)    IBS (irritable bowel syndrome)    Lower extremity edema    Lynch syndrome    Lynch syndrome    Migraine    Obesity    Ovarian cyst    Vitamin D deficiency   . EGD 01/01/2020 - Small hiatal hernia. - The examination was otherwise normal. - Recall EGD in 3 years.  Colonoscopy 01/01/2020: - Diverticulosis in the entire examined colon. - The examination was otherwise normal on direct and retroflexion views. - No polyps or cancers. - Colonoscopy recall 1 year  Current Medications, Allergies, Past Medical History, Past Surgical History, Family History and Social History were reviewed in [employer] record.   Review of Systems:   Constitutional: Negative for fever, sweats, chills or weight loss.  Respiratory: Negative for shortness of breath.   Cardiovascular: Negative for chest pain,  palpitations and leg swelling.  Gastrointestinal: See HPI.  Musculoskeletal: Negative for back pain or muscle aches.  Neurological: No headaches or paresthesias.   Physical Exam: Ht 5\' 2"  (1.575 m)   Wt 265 lb 6 oz (120.4 kg)   LMP 09/07/2016 Comment: IUD REMOVED 09/07/16  BMI 48.54 kg/m  Wt Readings from Last 3 Encounters:  12/31/20 265 lb 6 oz (120.4 kg)  01/01/20 247 lb (112 kg)  12/18/19 247 lb (112 kg)    General: 50 year old female in NAD.  Head: Normocephalic and atraumatic. Eyes: No  scleral icterus. Conjunctiva pink . Ears: Normal auditory acuity. Mouth: Dentition intact. No ulcers or lesions.  Lungs: Clear throughout to auscultation. Heart: Regular rate and rhythm, no murmur. Abdomen: Soft, nontender and nondistended. No masses or hepatomegaly. Normal bowel sounds x 4 quadrants.  Rectal: Deferred.  Musculoskeletal: Symmetrical with no gross deformities. Extremities: No edema. Neurological: Alert oriented x 4. No focal deficits.  Psychological: Alert and cooperative. Normal mood and affect  Assessment and Recommendations:  31) 50 year old female with LUQ pain. She had 3 episodes of LUQ pain, nausea, unwitnessed syncopal episodes followed by vomiting x 3 episodes as noted above, likely vasovagal response. She had diarrhea during the first episode.  -CBC, CMP, CRP and Lipase -CTAP with oral and IV contrast  -EGD at time of colonoscopy, see plan in # 2 -Famotidine 40mg  QHS -Hyoscyamine 0.125mg  one tab SL Q 6 to 8 hrs PRN   -GI pathogen panel to include C. Diff PCR -Push fluids, bland diet  -Zofran 4mg   ODT Q 6 to 8 hrs PRN  2) History of Lynch syndrome -Surveillance EGD due 12/2022, to consider repeat EGD at time of colonoscopy -Colonoscopy to be scheduled after CTAP results reviewed  -May need cardiac evaluation due to her 3 syncopal episodes prior to scheduling an EGD and colonoscopy   3) History of tubular adenomatous and sessile serrated colon polyps  -See plan in # 3  Further recommendations to be determined after CTAP and lab results received

## 2020-12-31 NOTE — Patient Instructions (Addendum)
You have been scheduled for a CT scan of the abdomen and pelvis at [facility] ([address]---this is in the same building as [employer]).   You are scheduled on 01/04/21 at 10:00am. You should arrive 15 minutes prior to your appointment time for registration. Please follow the written instructions below on the day of your exam:  WARNING: IF YOU ARE ALLERGIC TO IODINE/X-RAY DYE, PLEASE NOTIFY RADIOLOGY IMMEDIATELY AT [phone]! YOU WILL BE GIVEN A 13 HOUR PREMEDICATION PREP.  1) Do not eat or drink anything after 6:00am (4 hours prior to your test) 2) You have been given 2 bottles of oral contrast to drink. The solution may taste better if refrigerated, but do NOT add ice or any other liquid to this solution. Shake well before drinking.    Drink 1 bottle of contrast @ 8:00am (2 hours prior to your exam)  Drink 1 bottle of contrast @ 9:00am (1 hour prior to your exam)  You may take any medications as prescribed with a small amount of water, if necessary. If you take any of the following medications: METFORMIN, GLUCOPHAGE, GLUCOVANCE, AVANDAMET, RIOMET, FORTAMET, [place] MET, JANUMET, GLUMETZA or METAGLIP, you MAY be asked to HOLD this medication 48 hours AFTER the exam.  The purpose of you drinking the oral contrast is to aid in the visualization of your intestinal tract. The contrast solution may cause some diarrhea. Depending on your individual set of symptoms, you may also receive an intravenous injection of x-ray contrast/dye. Plan on being at [facility] for 30 minutes or longer, depending on the type of exam you are having performed.  This test typically takes 30-45 minutes to complete.  If you have any questions regarding your exam or if you need to reschedule, you may call the CT department at [phone] between the hours of 8:00 am and 5:00 pm,  Monday-Friday.  ________________________________________________________________________  MEDICATION: We have sent the following medication to your pharmacy for you to pick up at your convenience: Ondansetron (ZOFRAN ODT) 4 MG disintegrating tablet- Place one tablet under the tongue every 6-8 hours as needed for nausea or vomiting. Hyoscyamine (LEVSIN SL) 0.125 MG SL tablet: Place 1 tablet (0.125 mg total) under the tongue every 6 (six) hours as needed for abdominal pain. Famotidine 40 MG tablet once a day at bedtime.  RECOMMENDATIONS: Push your fluid intake. Follow a bland diet. Go to the emergency room if severe abdominal pain occurs.  LABS:  Lab work has been ordered for you today. Our lab is located in the basement. Press "B" on the elevator. The lab is located at the first door on the left as you exit the elevator.  HEALTHCARE LAWS AND MY CHART RESULTS: Due to recent changes in healthcare laws, you may see the results of your imaging and laboratory studies on MyChart before your provider has had a chance to review them.   We understand that in some cases there may be results that are confusing or concerning to you. Not all laboratory results come back in the same time frame and the provider may be waiting for multiple results in order to interpret others.  Please give [place] 48 hours in order for your provider to thoroughly review all the results before contacting the office for clarification of your results.   It was great seeing you today! Thank you for entrusting me with your care and choosing [facility].  Noralyn Pick, CRNP  The [place] [facility] providers would like  to encourage you to use [facility] to communicate with providers for non-urgent requests or questions.  Due to long hold times on the telephone, sending your provider a message by [facility] may be faster and more efficient way to get a response. Please allow 48 business hours for a response.  Please remember  that this is for non-urgent requests/questions. If you are age 85 or older, your body mass index should be between 23-30. Your Body mass index is 48.54 kg/m. If this is out of the aforementioned range listed, please consider follow up with your Primary Care Provider.  If you are age 65 or younger, your body mass index should be between 19-25. Your Body mass index is 48.54 kg/m. If this is out of the aformentioned range listed, please consider follow up with your Primary Care Provider.

## 2021-01-01 ENCOUNTER — Other Ambulatory Visit: Payer: BC Managed Care – PPO

## 2021-01-01 DIAGNOSIS — R197 Diarrhea, unspecified: Secondary | ICD-10-CM | POA: Diagnosis not present

## 2021-01-01 DIAGNOSIS — A048 Other specified bacterial intestinal infections: Secondary | ICD-10-CM

## 2021-01-01 DIAGNOSIS — R1012 Left upper quadrant pain: Secondary | ICD-10-CM | POA: Diagnosis not present

## 2021-01-01 DIAGNOSIS — Z86010 Personal history of colonic polyps: Secondary | ICD-10-CM

## 2021-01-01 DIAGNOSIS — R112 Nausea with vomiting, unspecified: Secondary | ICD-10-CM | POA: Diagnosis not present

## 2021-01-04 ENCOUNTER — Ambulatory Visit (INDEPENDENT_AMBULATORY_CARE_PROVIDER_SITE_OTHER)
Admission: RE | Admit: 2021-01-04 | Discharge: 2021-01-04 | Disposition: A | Discharge status: Home / Self Care | Payer: BC Managed Care – PPO | Source: Ambulatory Visit | Attending: Nurse Practitioner | Admitting: Nurse Practitioner

## 2021-01-04 ENCOUNTER — Other Ambulatory Visit: Payer: Self-pay

## 2021-01-04 DIAGNOSIS — Z86010 Personal history of colonic polyps: Secondary | ICD-10-CM

## 2021-01-04 DIAGNOSIS — R1110 Vomiting, unspecified: Secondary | ICD-10-CM | POA: Diagnosis not present

## 2021-01-04 DIAGNOSIS — R1012 Left upper quadrant pain: Secondary | ICD-10-CM | POA: Diagnosis not present

## 2021-01-04 DIAGNOSIS — K760 Fatty (change of) liver, not elsewhere classified: Secondary | ICD-10-CM | POA: Diagnosis not present

## 2021-01-04 DIAGNOSIS — R197 Diarrhea, unspecified: Secondary | ICD-10-CM | POA: Diagnosis not present

## 2021-01-04 DIAGNOSIS — R112 Nausea with vomiting, unspecified: Secondary | ICD-10-CM

## 2021-01-04 DIAGNOSIS — A048 Other specified bacterial intestinal infections: Secondary | ICD-10-CM

## 2021-01-04 IMAGING — CT CT ABD-PELV W/ CM
2 of 5 series · 16 of 46 positions shown, 18 images · IV contrast (OMNIPAQUE 300)
Comparison: [DATE].

CLINICAL DATA: LEFT lower quadrant pain with nausea vomiting and
diarrhea in a 50-year-old female.

EXAM:
CT ABDOMEN AND PELVIS WITH CONTRAST
TECHNIQUE: Multidetector CT imaging of the abdomen and pelvis was performed
using the standard protocol following bolus administration of
intravenous contrast.
CONTRAST:  100mL OMNIPAQUE IOHEXOL 350 MG/ML SOLN

[Series 2: abd/pel w · axial · 0.82mm/px · z∈[-518,-78]mm · 13 of 98 slices shown, 15 images]
[im 5/98  soft-tissue]
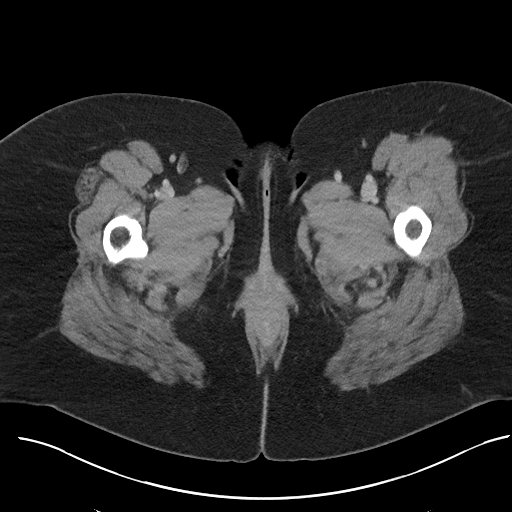
[im 5/98  bone]
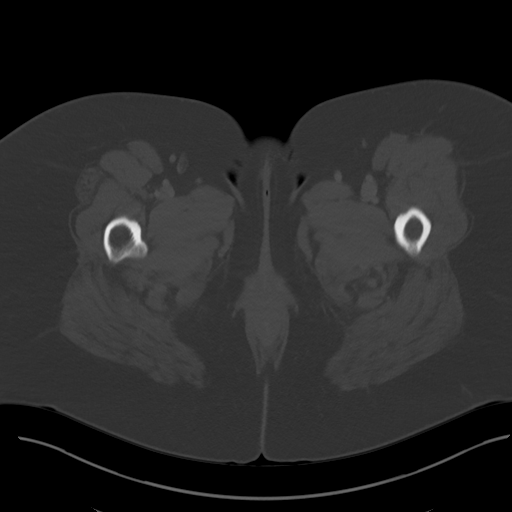
[im 15/98  soft-tissue]
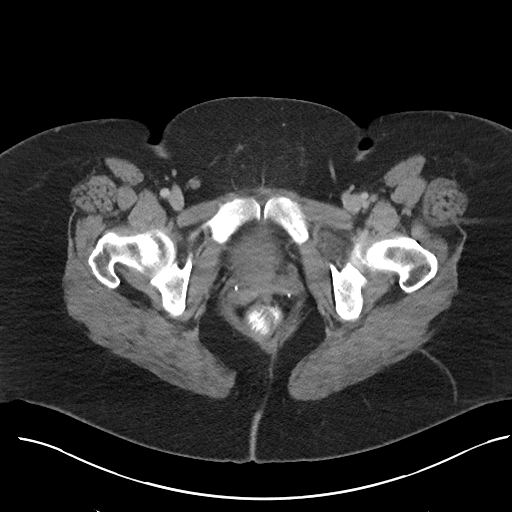
[im 20/98  soft-tissue]
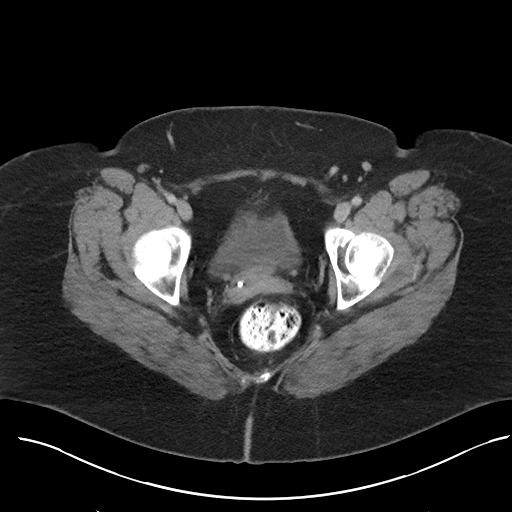
[im 30/98  soft-tissue]
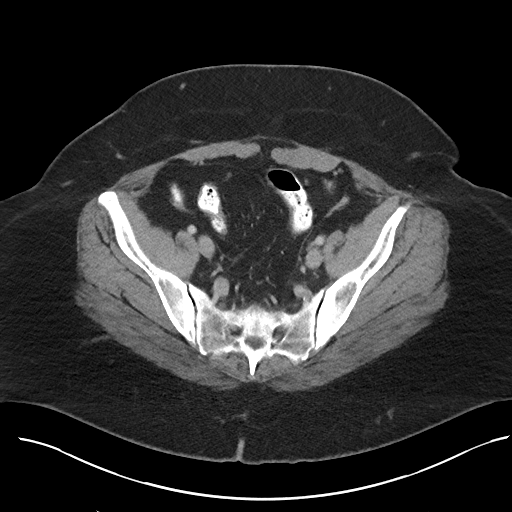
[im 34/98  soft-tissue]
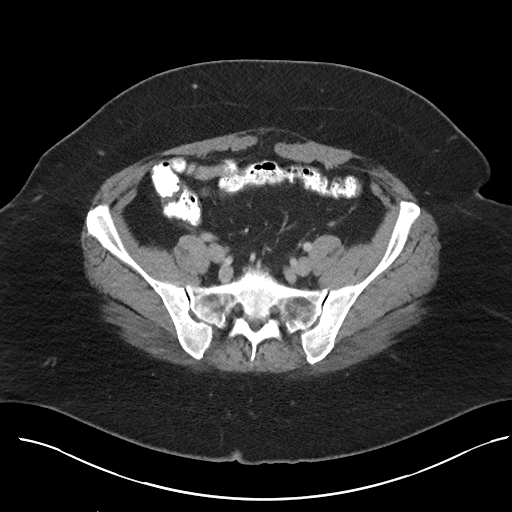
[im 44/98  soft-tissue]
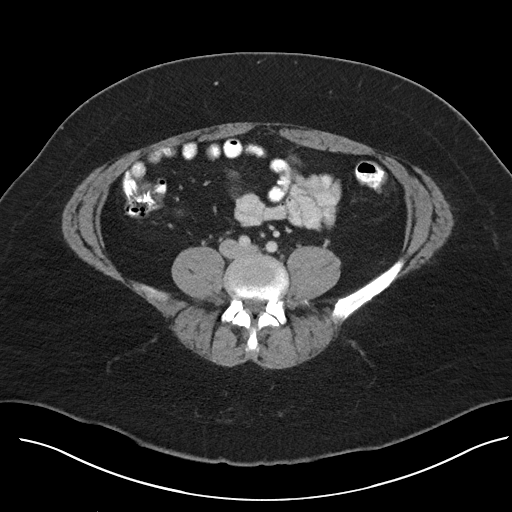
[im 49/98  soft-tissue]
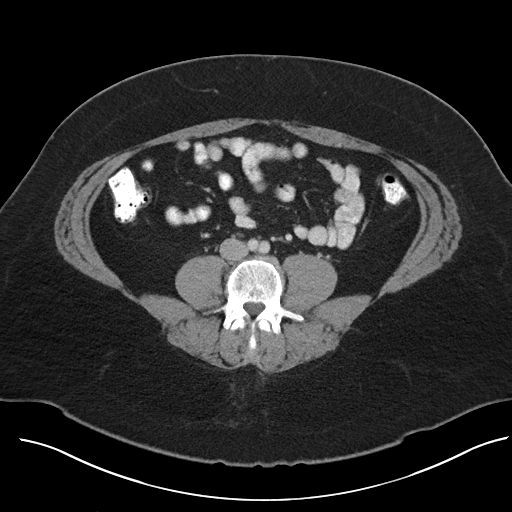
[im 54/98  soft-tissue]
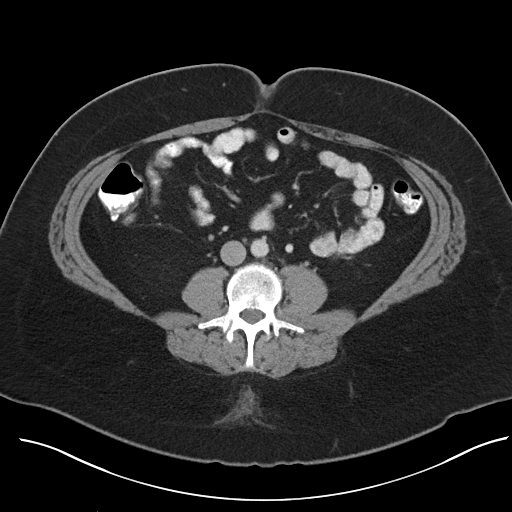
[im 64/98  soft-tissue]
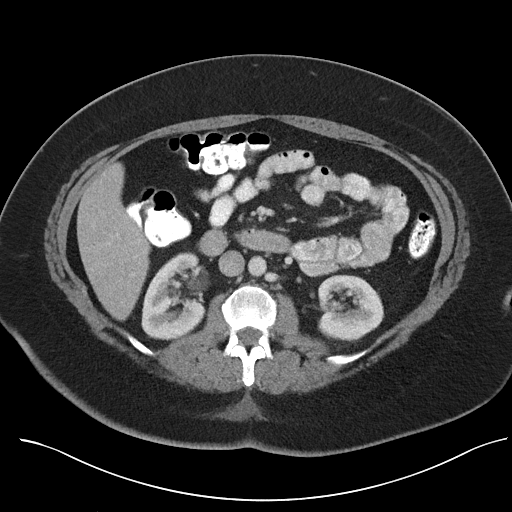
[im 64/98  bone]
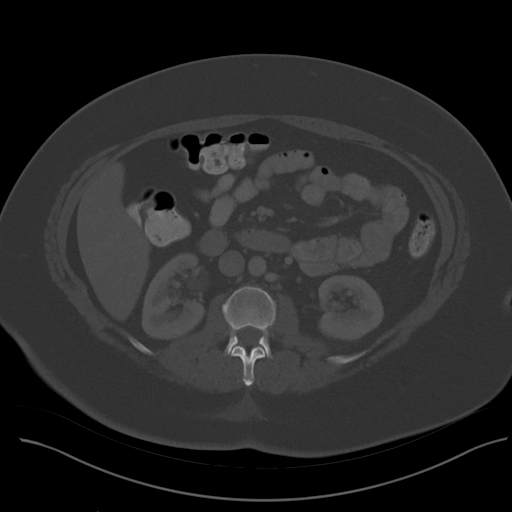
[im 68/98  soft-tissue]
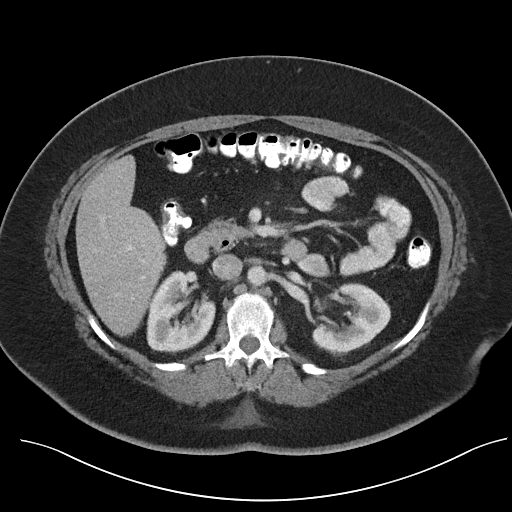
[im 78/98  soft-tissue]
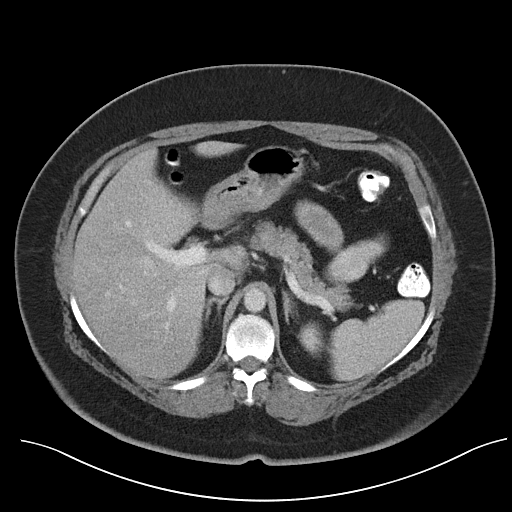
[im 83/98  soft-tissue]
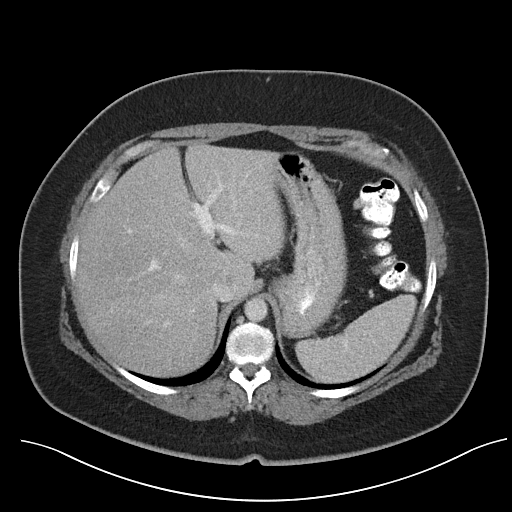
[im 93/98  soft-tissue]
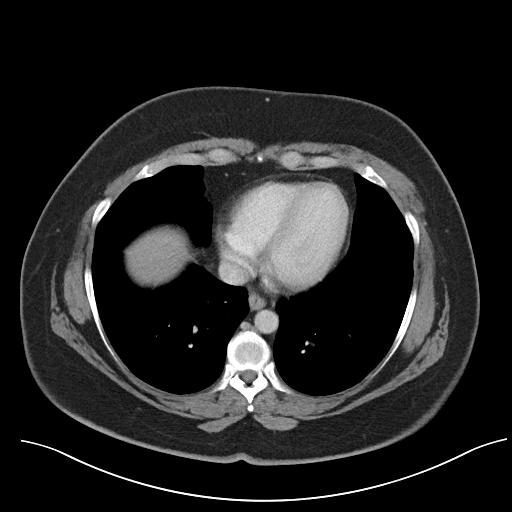

[Series 5: coronal st · coronal · 0.79mm/px · 3 of 90 slices shown]
[im 30/90  soft-tissue]
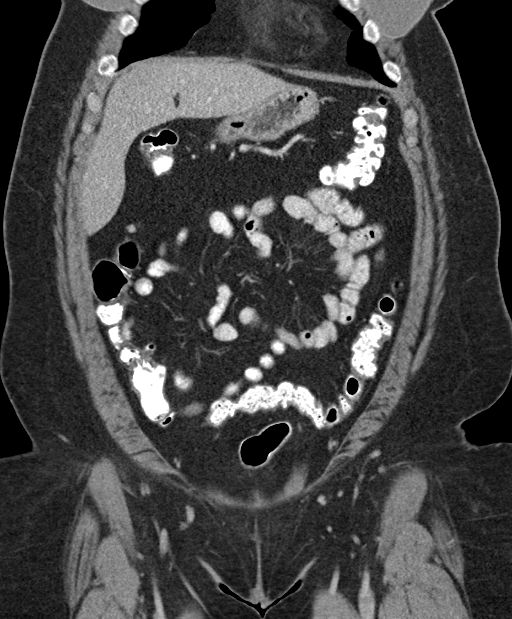
[im 40/90  soft-tissue]
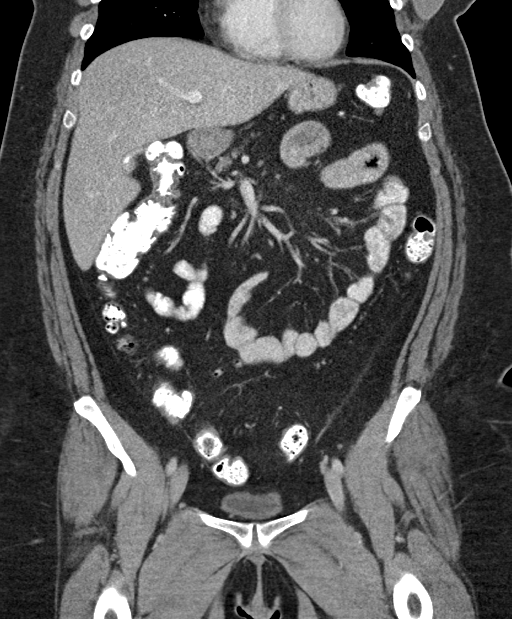
[im 50/90  soft-tissue]
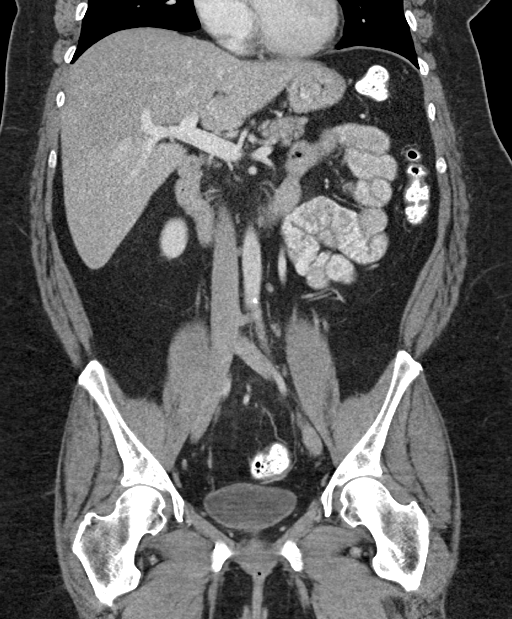

[16 of 46 positions shown; findings below may reference images not displayed]

FINDINGS: Lower chest: Lung bases are clear. No effusion. No consolidative
changes.

Hepatobiliary: Post cholecystectomy without substantial biliary duct
distension. Hepatic steatosis. No focal, suspicious hepatic lesion.
The portal vein is patent.

Pancreas: Normal, without mass, inflammation or ductal dilatation.

Spleen: Normal spleen.

Adrenals/Urinary Tract:

Adrenal glands are unremarkable. Symmetric renal enhancement. No
sign of hydronephrosis. No suspicious renal lesion or perinephric
stranding. Small cyst in the upper pole the LEFT kidney just at a
cm.

Urinary bladder is grossly unremarkable.

Stomach/Bowel: Scattered colonic diverticulosis both RIGHT and
LEFT-sided. Normal appendix. No acute small bowel process. No
stranding adjacent to the stomach.

Vascular/Lymphatic:

Aortic atherosclerosis. No sign of aneurysm. Smooth contour of the
IVC. There is no gastrohepatic or hepatoduodenal ligament
lymphadenopathy. No retroperitoneal or mesenteric lymphadenopathy.

No pelvic sidewall lymphadenopathy.

Reproductive: Post hysterectomy, no adnexal mass.

Other: Small fat containing umbilical hernia.

Musculoskeletal: No acute or destructive bone finding.
IMPRESSION: No acute findings in the abdomen or pelvis.

Hepatic steatosis.

Scattered colonic diverticulosis both RIGHT and LEFT-sided.

Small fat containing umbilical hernia.

## 2021-01-04 MED ORDER — IOHEXOL 350 MG/ML SOLN
100.0000 mL | Freq: Once | INTRAVENOUS | Status: AC | PRN
  Administered 2021-01-04: 100 mL via INTRAVENOUS

## 2021-01-05 LAB — GI PROFILE, STOOL, PCR

## 2021-01-05 NOTE — Progress Notes (Signed)
I agree with the above note, plan.  I do think that she would need cardiac "clearance" prior to invasive procedures given her syncopal episodes recently.

## 2021-01-07 ENCOUNTER — Telehealth: Payer: Self-pay

## 2021-01-07 ENCOUNTER — Other Ambulatory Visit: Payer: Self-pay

## 2021-01-07 DIAGNOSIS — R55 Syncope and collapse: Secondary | ICD-10-CM

## 2021-01-07 NOTE — Telephone Encounter (Signed)
Patient is advised of her CT results. Canceled the [place] colon that was scheduled for 01/22/21 with Dr Ardis Hughs. Referral entered for cardiology. Follow up appointment scheduled with Carl Best 02/03/21 at 3pm.

## 2021-01-07 NOTE — Telephone Encounter (Signed)
-----   Message from Noralyn Pick, NP sent at 01/07/2021  1:35 PM EDT ----- Eustaquio Maize, pls contact the patient and let her know her CTAP results do not explain her symptoms. She has a small fat containing umbilical hernia and a fatty liver. We can discuss these findings further at the time of her next follow up appointment.  Pls let her know she will need to see a cardiologist due to her 3 syncopal episodes, she will need an official cardiac clearance prior to scheduling an EGD and colonoscopy with Dr. Ardis Hughs. Thx

## 2021-01-22 ENCOUNTER — Other Ambulatory Visit: Payer: Self-pay | Admitting: Nurse Practitioner

## 2021-01-22 ENCOUNTER — Encounter: Payer: BC Managed Care – PPO | Admitting: Gastroenterology

## 2021-01-25 DIAGNOSIS — Z6841 Body Mass Index (BMI) 40.0 and over, adult: Secondary | ICD-10-CM | POA: Diagnosis not present

## 2021-01-25 DIAGNOSIS — Z01419 Encounter for gynecological examination (general) (routine) without abnormal findings: Secondary | ICD-10-CM | POA: Diagnosis not present

## 2021-02-03 ENCOUNTER — Ambulatory Visit: Payer: BC Managed Care – PPO | Admitting: Nurse Practitioner

## 2021-02-03 ENCOUNTER — Encounter: Payer: Self-pay | Admitting: Nurse Practitioner

## 2021-02-03 VITALS — BP 116/70 | HR 95 | Ht 62.0 in | Wt 263.0 lb

## 2021-02-03 DIAGNOSIS — R112 Nausea with vomiting, unspecified: Secondary | ICD-10-CM

## 2021-02-03 DIAGNOSIS — R197 Diarrhea, unspecified: Secondary | ICD-10-CM | POA: Diagnosis not present

## 2021-02-03 DIAGNOSIS — R1012 Left upper quadrant pain: Secondary | ICD-10-CM | POA: Diagnosis not present

## 2021-02-03 MED ORDER — ONDANSETRON 8 MG PO TBDP: ORAL_TABLET | ORAL | 1 refills | Status: DC

## 2021-02-03 MED ORDER — PROMETHAZINE HCL 12.5 MG PO TABS: 12.5000 mg | ORAL_TABLET | Freq: Two times a day (BID) | ORAL | 1 refills | Status: DC | PRN

## 2021-02-03 MED ORDER — FAMOTIDINE 40 MG PO TABS: 40.0000 mg | ORAL_TABLET | Freq: Every day | ORAL | 1 refills | Status: DC

## 2021-02-03 NOTE — Patient Instructions (Addendum)
If you are age 48 or younger, your body mass index should be between 19-25. Your Body mass index is 48.1 kg/m. If this is out of the aformentioned range listed, please consider follow up with your Primary Care Provider.   The [place] [facility] providers would like to encourage you to use [facility] to communicate with providers for non-urgent requests or questions.  Due to long hold times on the telephone, sending your provider a message by [facility] may be faster and more efficient way to get a response. Please allow 48 business hours for a response.  Please remember that this is for non-urgent requests/questions.  MEDICATION: We have sent the following medication to your pharmacy for you to pick up at your convenience: Famotidine 40 MG tablet, take one tablet daily. Ondansetron (ZOFRAN ODT) 8 MG disintegrating tablet- Place one tablet under the tongue every 6-8 hours as needed for nausea or vomiting. Phenergan 12.5 MG tablet, take 1 tablet twice a day as needed for nausea and vomiting.  RECOMMENDATIONS: Call our office to schedule colonoscopy/EGD once you have followed up with cardiology for clearance.  It was great seeing you today! Thank you for entrusting me with your care and choosing [facility].  Noralyn Pick, CRNP

## 2021-02-03 NOTE — Progress Notes (Addendum)
02/03/2021 Lynn Simpson [id number] Aug 20, 1970   Chief Complaint: Follow up LUQ pain  History of Present Illness: Lynn Simpson is a 50 female with a past medical history of anxiety, depression, asthma, preventative bilateral mastectomies 03/2016, Lynch syndrome, GERD,  diarrhea predominant IBS and colon polyps. She is followed by Dr. Ardis Hughs.  I last saw Lynn Simpson in the office 12/31/2020 due to having LUQ pain and to schedule a surveillance colonoscopy.  Refer to 10/13 office note for comprehensive history review.  An abdominal/pelvic CT scan with contrast showed evidence of hepatic steatosis, diverticulosis and a small fat-containing umbilical hernia, findings did not explain her LUQ pain.  An EGD and colonoscopy were recommended, however, due to her reported episodes of syncope a cardiac clearance was requested prior to pursuing any endoscopic evaluation.  She presents her office today for further follow-up.  Her LUQ pain has decreased but has not abated.  She describes feeling a hunger type pain to the LUQ approximately 5 days weekly.  She reported having 3 episodes of N/V and diarrhea since her last office visit.  She assesses her vomiting and abdominal pain is less.  Zofran is not as effective as it was previously and she requests an additional antiemetic.  She has taken Phenergan in the past without any adverse response.  She is scheduled to see the cardiologist 02/09/2021.  Laboratory studies 12/31/2020: WBC 9.7.  Hemoglobin 15.5.  Platelet 202.  Sodium 130.  Potassium 4.2.  BUN 14.  Creatinine 0.62.  Alk phos 91.  Lipase 46.  AST 19.  ALT 17.  Total bili 0.3.  CTAP with contrast 01/04/2021: No acute findings in the abdomen or pelvis.  Hepatic steatosis. Scattered colonic diverticulosis both RIGHT and LEFT-sided.  Small fat containing umbilical hernia.  Current Medications, Allergies, Past Medical History, Past Surgical History, Family History and Social History were reviewed in [employer] record.  Review of Systems:   Constitutional: Negative for fever, sweats, chills or weight loss.  Respiratory: Negative for shortness of breath.   Cardiovascular: Negative for chest pain, palpitations and leg swelling.  Gastrointestinal: See HPI.  Musculoskeletal: Negative for back pain or muscle aches.  Neurological: Negative for dizziness, headaches or paresthesias.   Physical Exam: BP 116/70   Pulse 95   Ht _0  (1.575 m)   Wt 263 lb (119.3 kg)   LMP 09/07/2016 Comment: IUD REMOVED 09/07/16  BMI 48.10 kg/m  General: 50 year old female in no acute distress Head: Normocephalic and atraumatic. Eyes: No scleral icterus. Conjunctiva pink . Ears: Normal auditory acuity. Lungs: Clear throughout to auscultation. Heart: Regular rate and rhythm, no murmur. Abdomen: Soft, nondistended.  Mild LUQ tenderness without rebound or guarding.  No masses or hepatomegaly. Normal bowel sounds x 4 quadrants.  Rectal: Deferred. Musculoskeletal: Symmetrical with no gross deformities. Extremities: No edema. Neurological: Alert oriented x 4. No focal deficits.  Psychological: Alert and cooperative. Normal mood and affect  Assessment and Recommendations:  8) 50 year old female with recurrent LUQ pain with episodic N/V/D.  CTAP 01/04/2021 findings did not explain her symptoms. -EGD and colonoscopy will be scheduled after cardiac clearance received -Zofran 8 mg ODT 1 tab dissolve on tongue every 6-8 hours as needed -Phenergan 12.5 mg 1 p.o. twice daily as needed  2) History of Lynch syndrome -EGD and colonoscopy will be scheduled after cardiac clearance received -Continue Famotidine 40 mg 1 p.o. daily   3) History of tubular adenomatous and sessile serrated colon polyps  -See  plan in # 1  Further recommendations to be determined after the above evaluation completed  ADDENDUM: Cardiac clearance received from Dr. Phineas Inches 02/09/2021. Patient to be contacted to schedule  EGD/colonoscopy.

## 2021-02-04 NOTE — Progress Notes (Signed)
I agree with the above note, plan 

## 2021-02-09 ENCOUNTER — Encounter: Payer: Self-pay | Admitting: Internal Medicine

## 2021-02-09 ENCOUNTER — Ambulatory Visit: Payer: BC Managed Care – PPO | Admitting: Internal Medicine

## 2021-02-09 ENCOUNTER — Other Ambulatory Visit: Payer: Self-pay

## 2021-02-09 VITALS — BP 140/90 | HR 81 | Resp 20 | Ht 62.0 in | Wt 262.0 lb

## 2021-02-09 DIAGNOSIS — R55 Syncope and collapse: Secondary | ICD-10-CM | POA: Diagnosis not present

## 2021-02-09 NOTE — Patient Instructions (Signed)
Medication Instructions:  No Changes In Medications at this time.  *If you need a refill on your cardiac medications before your next appointment, please call your pharmacy*  Follow-Up: At [facility], you and your health needs are our priority.  As part of our continuing mission to provide you with exceptional heart care, we have created designated Provider Care Teams.  These Care Teams include your primary Cardiologist (physician) and Advanced Practice Providers (APPs -  Physician Assistants and Nurse Practitioners) who all work together to provide you with the care you need, when you need it.  We recommend signing up for the patient portal called "MyChart".  Sign up information is provided on this After Visit Summary.  MyChart is used to connect with patients for Virtual Visits (Telemedicine).  Patients are able to view lab/test results, encounter notes, upcoming appointments, etc.  Non-urgent messages can be sent to your provider as well.   To learn more about what you can do with MyChart, go to [email or web address].    Your next appointment:   AS NEEDED   The format for your next appointment:   In Person  Provider:   Janina Mayo, MD    Other Instructions Syncope, Adult Syncope is when you pass out or faint for a short time. It is caused by a sudden decrease in blood flow to the brain. This can happen for many reasons. It can sometimes happen when seeing blood, getting a shot (injection), or having pain or strong emotions. Most causes of fainting are not dangerous, but in some cases it can be a sign of a serious medical problem. If you faint, get help right away. Call your local emergency services (911 in the [place] Follow these instructions at home: Watch for any changes in your symptoms. Take these actions to stay safe and help with your symptoms: Knowing when you may be about to faint Signs that you may be about to faint include: Feeling dizzy or light-headed. It may feel  like the room is spinning. Feeling weak. Feeling like you may vomit (nauseous). Seeing spots or seeing all white or all black. Having cold, clammy skin. Feeling warm and sweaty. Hearing ringing in the ears. If you start to feel like you might faint, sit or lie down right away. If sitting, lower your head down between your legs. If lying down, raise (elevate) your feet above the level of your heart. Breathe deeply and steadily. Wait until all of the symptoms are gone. Have someone stay with you until you feel better. Medicines Take over-the-counter and prescription medicines only as told by your doctor. If you are taking blood pressure or heart medicine, sit up and stand up slowly. Spend a few minutes getting ready to sit and then stand. This can help you feel less dizzy.  General instructions Talk with your doctor about your symptoms. You may need to have testing to help find the cause. Drink enough fluid to keep your pee (urine) pale yellow. Avoid standing for a long time. If you must stand for a long time, do movements such as: Moving your legs. Crossing your legs. Flexing and stretching your leg muscles. Squatting. Keep all follow-up visits. Contact a doctor if: You have episodes of near fainting. Get help right away if: You pass out or faint. You hit your head or are injured after fainting. You have any of these symptoms: Fast or uneven heartbeats (palpitations). Pain in your chest, belly, or back. Shortness of breath. You have jerky movements  that you cannot control (seizure). You have a very bad headache. You are confused. You have problems with how you see (vision). You are very weak. You have trouble walking. You are bleeding from your mouth or your butt (rectum). You have black or tarry poop (stool). These symptoms may be an emergency. Get help right away. Call your local emergency services (911 in the [place] Do not wait to see if the symptoms will go away. Do not  drive yourself to the hospital. Summary Syncope is when you pass out or faint for a short time. It is caused by a sudden decrease in blood flow to the brain. Signs that you may be about to faint include feeling dizzy or light-headed, feeling like you may vomit, seeing all white or all black, or having cold, clammy skin. If you start to feel like you might faint, sit or lie down right away. Lower your head if sitting, or raise (elevate) your feet if lying down. Breathe deeply and steadily. Wait until all of the symptoms are gone. This information is not intended to replace advice given to you by your health care provider. Make sure you discuss any questions you have with your health care provider. Document Revised: 07/16/2020 Document Reviewed: 07/16/2020 Elsevier Patient Education  2022 [employer].

## 2021-02-09 NOTE — Progress Notes (Signed)
Lynn Simpson, cardiac clearance received. Pls contact patient to schedule and EGD and colonoscopy with Dr .Ardis Hughs.  Refer to last office visit for procedure orders. Thx   Dr. Jac Canavan

## 2021-02-09 NOTE — Progress Notes (Signed)
Cardiology Office Note:    Date:  02/09/2021   ID:  Lynn Simpson, DOB June 10, 1970, MRN [id number]  PCP:  Haywood Pao, MD   [facility] Providers Cardiologist:  None     Referring MD: Carl Best *   No chief complaint on file. Syncope  History of Present Illness:    Lynn Simpson is a 50 y.o. female with a hx below, no cardiac hx, L breast cancer BL mastectomy Stage 0 no chemo , no radiation, lynch syndrome, GI requesting clearance for EGD/colonoscopy  because she reported episodes of syncope  She notes that she 3x when she was sick. She has had abdominal pain, vomiting, diarrhea. She starts sweating. She gets tunnel vision. She then passes out. She always knows when it is happening. These occurred around labor day and 1-2 times in October. She notes some palpitations at night.  In the past, she used to pass out prior to her menstrual cycle. She had MRIs in the past. She's had no episodes since then. She continues to have nausea and vomiting. She is unsure the cause. She thinks she is getting enough fluid intake. No LH with sitting to standing. She does have hx of vertigo. No cardiac dx history. No family hx of heart disease.No family hx of SCD. She currently smokes for 30 years . She's had no problems with prior procedures. No chest pain and no shortness of breath. No PND, orthopnea, no LE edema.   Past Medical History:  Diagnosis Date   Allergy    Anxiety    Asthma    Back pain    Cancer ([place]) 2018   Left Breast   COVID-19 virus infection 11/2019   Depression    Dyspnea    Family history of breast cancer    Family history of melanoma    Family history of ovarian cancer    Gallstones    GERD (gastroesophageal reflux disease)    IBS (irritable bowel syndrome)    Lower extremity edema    Lynch syndrome    Lynch syndrome    Migraine    Obesity    Ovarian cyst    Vitamin D deficiency     Past Surgical History:  Procedure Laterality Date   ANKLE  FRACTURE SURGERY Right 12/2018   BREAST RECONSTRUCTION WITH PLACEMENT OF TISSUE EXPANDER AND FLEX HD (ACELLULAR HYDRATED DERMIS) Bilateral 03/31/2016   Procedure: BILATERAL BREAST RECONSTRUCTION WITH PLACEMENT OF TISSUE EXPANDER AND FLEX HD (ACELLULAR HYDRATED DERMIS);  Surgeon: Wallace Going, DO;  Location: [facility];  Service: Plastics;  Laterality: Bilateral;   CESAREAN SECTION  2005   COLONOSCOPY     LAPAROSCOPIC VAGINAL HYSTERECTOMY WITH SALPINGO OOPHORECTOMY Bilateral 09/15/2016   Procedure: LAPAROSCOPIC ASSISTED VAGINAL HYSTERECTOMY WITH BILATERAL SALPINGO OOPHORECTOMY;  Surgeon: Molli Posey, MD;  Location: [facility];  Service: Gynecology;  Laterality: Bilateral;   LIPOSUCTION Bilateral 06/30/2016   Procedure: LIPOSUCTION TO LATERAL CHEST;  Surgeon: Wallace Going, DO;  Location: [facility];  Service: Plastics;  Laterality: Bilateral;   NIPPLE SPARING MASTECTOMY Bilateral 03/31/2016   Procedure: BILATERAL NIPPLE SPARING MASTECTOMIES;  Surgeon: Autumn Messing III, MD;  Location: [facility];  Service: General;  Laterality: Bilateral;   POLYPECTOMY     REMOVAL OF BILATERAL TISSUE EXPANDERS WITH PLACEMENT OF BILATERAL BREAST IMPLANTS Bilateral 06/30/2016   Procedure: REMOVAL OF BILATERAL TISSUE EXPANDERS WITH PLACEMENT OF BILATERAL SILCONE BREAST IMPLANTS;  Surgeon: Loel Lofty Dillingham, DO;  [facility];  Service: Plastics;  Laterality: Bilateral;   WISDOM TOOTH EXTRACTION      Current Medications: No outpatient medications have been marked as taking for the 02/09/21 encounter (Appointment) with Janina Mayo, MD.     Allergies:   Bee venom and Wellbutrin [bupropion]   Social History   Socioeconomic History   Marital status: Married    Spouse name: Lynn Simpson   Number of children: 3   Years of education: Not on file   Highest education level: Not on file  Occupational History   Occupation: Sales  Tobacco  Use   Smoking status: Former    Packs/day: 0.50    Years: 20.00    Pack years: 10.00    Types: Cigarettes    Quit date: 12/20/2015    Years since quitting: 5.1   Smokeless tobacco: Never   Tobacco comments:    form given [id number]  Vaping Use   Vaping Use: Never used  Substance and Sexual Activity   Alcohol use: Yes    Comment: occ   Drug use: No   Sexual activity: Yes    Birth control/protection: I.U.D., Post-menopausal    Comment: IUD REMOVED 09/07/16  Other Topics Concern   Not on file  Social History Narrative   Not on file   Social Determinants of Health   Financial Resource Strain: Not on file  Food Insecurity: Not on file  Transportation Needs: Not on file  Physical Activity: Not on file  Stress: Not on file  Social Connections: Not on file     Family History: The patient's family history includes Alcoholism in her mother; Breast cancer in her maternal grandmother; Breast cancer (age of onset: 15) in her sister; Breast cancer (age of onset: 47) in her mother; Cancer in her paternal grandfather; Colon cancer in her maternal grandmother; Liver cancer in her mother; Lung cancer (age of onset: 83) in her father; Melanoma in her maternal aunt; Ovarian cancer in her maternal grandmother. There is no history of Colon polyps, Rectal cancer, Stomach cancer, or Esophageal cancer.  ROS:   Please see the history of present illness.     All other systems reviewed and are negative.  EKGs/Labs/Other Studies Reviewed:    The following studies were reviewed today:   EKG:  EKG is  ordered today.  The ekg ordered today demonstrates  NSR, no pre-excitation, Qtc 450 ms  Recent Labs: 12/31/2020: ALT 17; BUN 14; Creatinine, Ser 0.62; Hemoglobin 15.5; Platelets 202.0; Potassium 4.2; Sodium 138  Recent Lipid Panel    Component Value Date/Time   CHOL 212 (H) 08/21/2018 0000   TRIG 79 08/21/2018 0000   HDL 59 08/21/2018 0000   LDLCALC 137 (H) 08/21/2018 0000     Risk  Assessment/Calculations:           Physical Exam:    VS:    Vitals:   02/09/21 1558  BP: 140/90  Pulse: 81  Resp: 20      LMP 09/07/2016 Comment: IUD REMOVED 09/07/16    Wt Readings from Last 3 Encounters:  02/03/21 263 lb (119.3 kg)  12/31/20 265 lb 6 oz (120.4 kg)  01/01/20 247 lb (112 kg)     GEN:  Well nourished, well developed in no acute distress HEENT: Normal NECK: No JVD; No carotid bruits LYMPHATICS: No lymphadenopathy CARDIAC: RRR, no murmurs, rubs, gallops RESPIRATORY:  Clear to auscultation without rales, wheezing or rhonchi  ABDOMEN: Soft, non-tender, non-distended MUSCULOSKELETAL:  No edema; No deformity  SKIN: Warm and dry NEUROLOGIC:  Alert and oriented x 3 PSYCHIATRIC:  Normal affect   ASSESSMENT:    #Vasovagal Syncope: She describes prodrome of symptoms prior to syncopal episode. This is benign. She does not have high risk features including syncope c/f arrhythmia , family hx of SCD, or abnormalities on her EKG. I do not have any reservations for upcoming procedures including EGD/colonoscopy with anesthesia.   PLAN:    In order of problems listed above:  Follow up PRN      Medication Adjustments/Labs and Tests Ordered: Current medicines are reviewed at length with the patient today.  Concerns regarding medicines are outlined above.   Signed, Janina Mayo, MD  02/09/2021 1:15 PM    [facility]

## 2021-02-10 ENCOUNTER — Other Ambulatory Visit: Payer: Self-pay

## 2021-02-10 ENCOUNTER — Telehealth: Payer: Self-pay

## 2021-02-10 MED ORDER — CLENPIQ 10-3.5-12 MG-GM -GM/160ML PO SOLN: ORAL | 0 refills | Status: DC

## 2021-02-10 NOTE — Telephone Encounter (Signed)
-----   Message from Noralyn Pick, NP sent at 02/09/2021  7:21 PM EST -----    ----- Message ----- From: Janina Mayo, MD Sent: 02/09/2021   4:38 PM EST To: Noralyn Pick, NP

## 2021-02-10 NOTE — Telephone Encounter (Signed)
Please schedule the patient and route back to me. I will send the instructions to the patient. No pre-visit is needed. Thank you

## 2021-02-10 NOTE — Telephone Encounter (Signed)
Instructions and prescription mailed to the patient.

## 2021-02-10 NOTE — Telephone Encounter (Signed)
ADDENDUM: Cardiac clearance received from Dr. Phineas Inches 02/09/2021. Patient to be contacted to schedule EGD/colonoscopy

## 2021-02-10 NOTE — Telephone Encounter (Signed)
Patient has been scheduled for a double on 03/30/21 with Dr. Ardis Hughs.

## 2021-03-10 ENCOUNTER — Telehealth: Payer: Self-pay | Admitting: Gastroenterology

## 2021-03-10 ENCOUNTER — Inpatient Hospital Stay (HOSPITAL_COMMUNITY): Payer: BC Managed Care – PPO

## 2021-03-10 ENCOUNTER — Encounter (HOSPITAL_BASED_OUTPATIENT_CLINIC_OR_DEPARTMENT_OTHER): Payer: Self-pay

## 2021-03-10 ENCOUNTER — Inpatient Hospital Stay (HOSPITAL_BASED_OUTPATIENT_CLINIC_OR_DEPARTMENT_OTHER)
Admission: EM | Admit: 2021-03-10 | Discharge: 2021-03-19 | DRG: 418 | Disposition: A | Discharge status: Other Acute Inpatient Hospital | Payer: BC Managed Care – PPO | Attending: Internal Medicine | Admitting: Internal Medicine

## 2021-03-10 ENCOUNTER — Emergency Department (HOSPITAL_BASED_OUTPATIENT_CLINIC_OR_DEPARTMENT_OTHER): Payer: BC Managed Care – PPO

## 2021-03-10 ENCOUNTER — Other Ambulatory Visit: Payer: Self-pay

## 2021-03-10 DIAGNOSIS — Z6841 Body Mass Index (BMI) 40.0 and over, adult: Secondary | ICD-10-CM | POA: Diagnosis not present

## 2021-03-10 DIAGNOSIS — K7689 Other specified diseases of liver: Secondary | ICD-10-CM | POA: Diagnosis not present

## 2021-03-10 DIAGNOSIS — F418 Other specified anxiety disorders: Secondary | ICD-10-CM | POA: Diagnosis not present

## 2021-03-10 DIAGNOSIS — Z79899 Other long term (current) drug therapy: Secondary | ICD-10-CM

## 2021-03-10 DIAGNOSIS — E669 Obesity, unspecified: Secondary | ICD-10-CM | POA: Diagnosis not present

## 2021-03-10 DIAGNOSIS — K8050 Calculus of bile duct without cholangitis or cholecystitis without obstruction: Secondary | ICD-10-CM | POA: Diagnosis not present

## 2021-03-10 DIAGNOSIS — E559 Vitamin D deficiency, unspecified: Secondary | ICD-10-CM | POA: Diagnosis not present

## 2021-03-10 DIAGNOSIS — K8063 Calculus of gallbladder and bile duct with acute cholecystitis with obstruction: Secondary | ICD-10-CM | POA: Diagnosis not present

## 2021-03-10 DIAGNOSIS — Z90710 Acquired absence of both cervix and uterus: Secondary | ICD-10-CM | POA: Diagnosis not present

## 2021-03-10 DIAGNOSIS — Z9013 Acquired absence of bilateral breasts and nipples: Secondary | ICD-10-CM

## 2021-03-10 DIAGNOSIS — Z9889 Other specified postprocedural states: Secondary | ICD-10-CM | POA: Diagnosis not present

## 2021-03-10 DIAGNOSIS — K839 Disease of biliary tract, unspecified: Secondary | ICD-10-CM | POA: Diagnosis not present

## 2021-03-10 DIAGNOSIS — Z8616 Personal history of COVID-19: Secondary | ICD-10-CM

## 2021-03-10 DIAGNOSIS — K8001 Calculus of gallbladder with acute cholecystitis with obstruction: Secondary | ICD-10-CM | POA: Diagnosis not present

## 2021-03-10 DIAGNOSIS — Z91030 Bee allergy status: Secondary | ICD-10-CM

## 2021-03-10 DIAGNOSIS — Z803 Family history of malignant neoplasm of breast: Secondary | ICD-10-CM

## 2021-03-10 DIAGNOSIS — Z4659 Encounter for fitting and adjustment of other gastrointestinal appliance and device: Secondary | ICD-10-CM | POA: Diagnosis not present

## 2021-03-10 DIAGNOSIS — Z20822 Contact with and (suspected) exposure to covid-19: Secondary | ICD-10-CM | POA: Diagnosis not present

## 2021-03-10 DIAGNOSIS — G43909 Migraine, unspecified, not intractable, without status migrainosus: Secondary | ICD-10-CM | POA: Diagnosis not present

## 2021-03-10 DIAGNOSIS — Z9689 Presence of other specified functional implants: Secondary | ICD-10-CM | POA: Diagnosis not present

## 2021-03-10 DIAGNOSIS — K317 Polyp of stomach and duodenum: Secondary | ICD-10-CM | POA: Diagnosis not present

## 2021-03-10 DIAGNOSIS — Z888 Allergy status to other drugs, medicaments and biological substances status: Secondary | ICD-10-CM

## 2021-03-10 DIAGNOSIS — J45909 Unspecified asthma, uncomplicated: Secondary | ICD-10-CM | POA: Diagnosis present

## 2021-03-10 DIAGNOSIS — Z419 Encounter for procedure for purposes other than remedying health state, unspecified: Secondary | ICD-10-CM

## 2021-03-10 DIAGNOSIS — N281 Cyst of kidney, acquired: Secondary | ICD-10-CM | POA: Diagnosis not present

## 2021-03-10 DIAGNOSIS — R17 Unspecified jaundice: Secondary | ICD-10-CM | POA: Diagnosis not present

## 2021-03-10 DIAGNOSIS — K819 Cholecystitis, unspecified: Secondary | ICD-10-CM

## 2021-03-10 DIAGNOSIS — R188 Other ascites: Secondary | ICD-10-CM | POA: Diagnosis not present

## 2021-03-10 DIAGNOSIS — Z9049 Acquired absence of other specified parts of digestive tract: Secondary | ICD-10-CM | POA: Diagnosis not present

## 2021-03-10 DIAGNOSIS — R933 Abnormal findings on diagnostic imaging of other parts of digestive tract: Secondary | ICD-10-CM | POA: Diagnosis not present

## 2021-03-10 DIAGNOSIS — Z853 Personal history of malignant neoplasm of breast: Secondary | ICD-10-CM

## 2021-03-10 DIAGNOSIS — R7989 Other specified abnormal findings of blood chemistry: Secondary | ICD-10-CM | POA: Diagnosis not present

## 2021-03-10 DIAGNOSIS — K760 Fatty (change of) liver, not elsewhere classified: Secondary | ICD-10-CM | POA: Diagnosis not present

## 2021-03-10 DIAGNOSIS — K8042 Calculus of bile duct with acute cholecystitis without obstruction: Secondary | ICD-10-CM | POA: Diagnosis not present

## 2021-03-10 DIAGNOSIS — F17210 Nicotine dependence, cigarettes, uncomplicated: Secondary | ICD-10-CM | POA: Diagnosis not present

## 2021-03-10 DIAGNOSIS — K828 Other specified diseases of gallbladder: Secondary | ICD-10-CM | POA: Diagnosis not present

## 2021-03-10 DIAGNOSIS — K219 Gastro-esophageal reflux disease without esophagitis: Secondary | ICD-10-CM | POA: Diagnosis present

## 2021-03-10 DIAGNOSIS — K8010 Calculus of gallbladder with chronic cholecystitis without obstruction: Secondary | ICD-10-CM | POA: Diagnosis not present

## 2021-03-10 DIAGNOSIS — K831 Obstruction of bile duct: Secondary | ICD-10-CM | POA: Diagnosis not present

## 2021-03-10 DIAGNOSIS — Z1509 Genetic susceptibility to other malignant neoplasm: Secondary | ICD-10-CM

## 2021-03-10 DIAGNOSIS — Z434 Encounter for attention to other artificial openings of digestive tract: Secondary | ICD-10-CM | POA: Diagnosis not present

## 2021-03-10 DIAGNOSIS — R945 Abnormal results of liver function studies: Secondary | ICD-10-CM | POA: Diagnosis not present

## 2021-03-10 DIAGNOSIS — K567 Ileus, unspecified: Secondary | ICD-10-CM | POA: Diagnosis not present

## 2021-03-10 DIAGNOSIS — K838 Other specified diseases of biliary tract: Secondary | ICD-10-CM | POA: Diagnosis not present

## 2021-03-10 DIAGNOSIS — K8020 Calculus of gallbladder without cholecystitis without obstruction: Secondary | ICD-10-CM | POA: Diagnosis not present

## 2021-03-10 LAB — CBC WITH DIFFERENTIAL/PLATELET
Abs Immature Granulocytes: 0.02 K/uL (ref 0.00–0.07)
Basophils Absolute: 0 K/uL (ref 0.0–0.1)
Basophils Relative: 1 %
Eosinophils Absolute: 0.4 K/uL (ref 0.0–0.5)
Eosinophils Relative: 7 %
HCT: 45.3 % (ref 36.0–46.0)
Hemoglobin: 15.3 g/dL — ABNORMAL HIGH (ref 12.0–15.0)
Immature Granulocytes: 0 %
Lymphocytes Relative: 30 %
Lymphs Abs: 1.9 K/uL (ref 0.7–4.0)
MCH: 30.3 pg (ref 26.0–34.0)
MCHC: 33.8 g/dL (ref 30.0–36.0)
MCV: 89.7 fL (ref 80.0–100.0)
Monocytes Absolute: 0.3 K/uL (ref 0.1–1.0)
Monocytes Relative: 4 %
Neutro Abs: 3.6 K/uL (ref 1.7–7.7)
Neutrophils Relative %: 58 %
Platelets: 180 K/uL (ref 150–400)
RBC: 5.05 MIL/uL (ref 3.87–5.11)
RDW: 12.3 % (ref 11.5–15.5)
WBC: 6.2 K/uL (ref 4.0–10.5)
nRBC: 0 % (ref 0.0–0.2)

## 2021-03-10 LAB — RESP PANEL BY RT-PCR (FLU A&B, COVID) ARPGX2
Influenza A by PCR: NEGATIVE
Influenza B by PCR: NEGATIVE
SARS Coronavirus 2 by RT PCR: NEGATIVE

## 2021-03-10 LAB — COMPREHENSIVE METABOLIC PANEL
ALT: 888 U/L — ABNORMAL HIGH (ref 0–44)
AST: 604 U/L — ABNORMAL HIGH (ref 15–41)
Albumin: 4.1 g/dL (ref 3.5–5.0)
Alkaline Phosphatase: 207 U/L — ABNORMAL HIGH (ref 38–126)
Anion gap: 9 (ref 5–15)
BUN: 12 mg/dL (ref 6–20)
CO2: 27 mmol/L (ref 22–32)
Calcium: 8.9 mg/dL (ref 8.9–10.3)
Chloride: 101 mmol/L (ref 98–111)
Creatinine, Ser: 0.52 mg/dL (ref 0.44–1.00)
GFR, Estimated: >60 mL/min (ref >60)
Glucose, Bld: 99 mg/dL (ref 70–99)
Potassium: 3.6 mmol/L (ref 3.5–5.1)
Sodium: 137 mmol/L (ref 135–145)
Total Bilirubin: 2.8 mg/dL — ABNORMAL HIGH (ref 0.3–1.2)
Total Protein: 7.7 g/dL (ref 6.5–8.1)

## 2021-03-10 LAB — URINALYSIS, ROUTINE W REFLEX MICROSCOPIC
Glucose, UA: 100 mg/dL — AB
Hgb urine dipstick: NEGATIVE
Ketones, ur: NEGATIVE mg/dL
Leukocytes,Ua: NEGATIVE
Nitrite: NEGATIVE
Protein, ur: 30 mg/dL — AB
Specific Gravity, Urine: >=1.03 (ref 1.005–1.030)
pH: 6 (ref 5.0–8.0)

## 2021-03-10 LAB — URINALYSIS, MICROSCOPIC (REFLEX)

## 2021-03-10 LAB — HEPATITIS PANEL, ACUTE
HCV Ab: NONREACTIVE
Hep A IgM: NONREACTIVE
Hep B C IgM: NONREACTIVE
Hepatitis B Surface Ag: NONREACTIVE

## 2021-03-10 LAB — PROTIME-INR
INR: 1 (ref 0.8–1.2)
Prothrombin Time: 13.2 seconds (ref 11.4–15.2)

## 2021-03-10 LAB — LIPASE, BLOOD: Lipase: 26 U/L (ref 11–51)

## 2021-03-10 LAB — APTT: aPTT: 27 seconds (ref 24–36)

## 2021-03-10 IMAGING — US US ABDOMEN LIMITED
1 series · 14 of 25 positions shown · non-contrast
Comparison: Abdominal ultrasound [DATE]

CLINICAL DATA: Right upper quadrant pain

EXAM:
ULTRASOUND ABDOMEN LIMITED RIGHT UPPER QUADRANT

[Series 1: us abdomen limited · 14 of 45 slices shown]
[im 1/45]
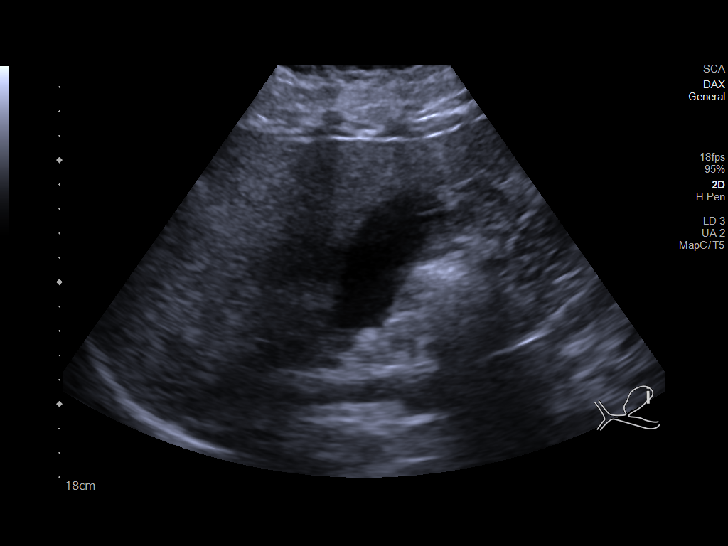
[im 4/45]
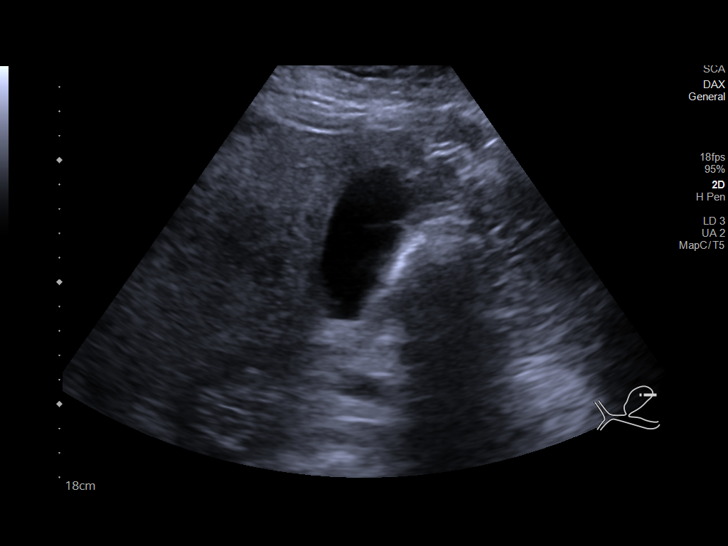
[im 8/45]
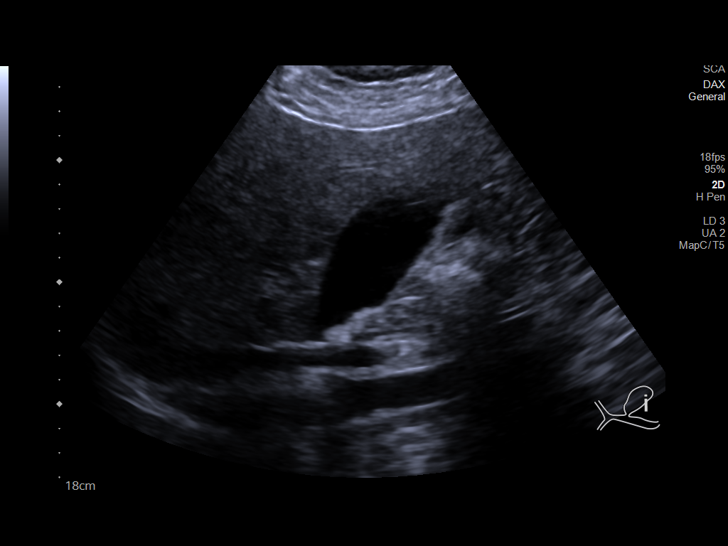
[im 12/45]
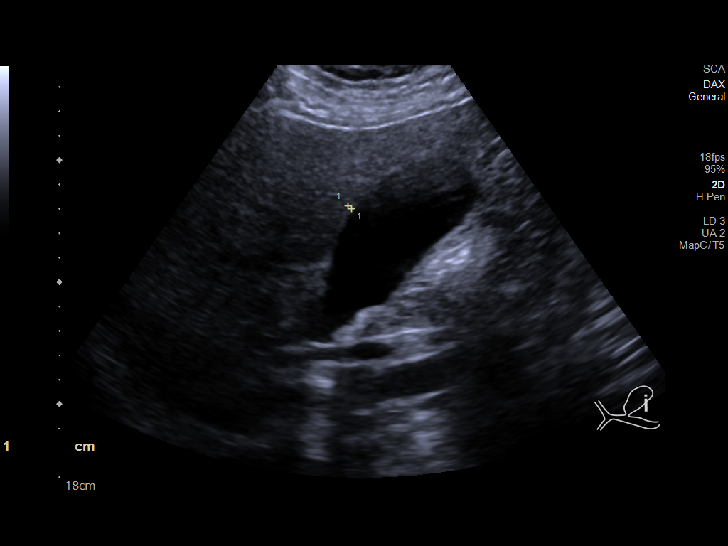
[im 15/45]
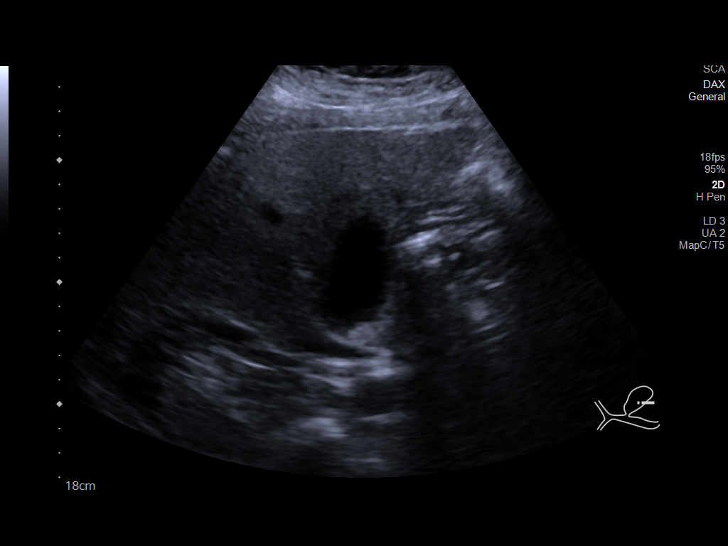
[im 17/45]
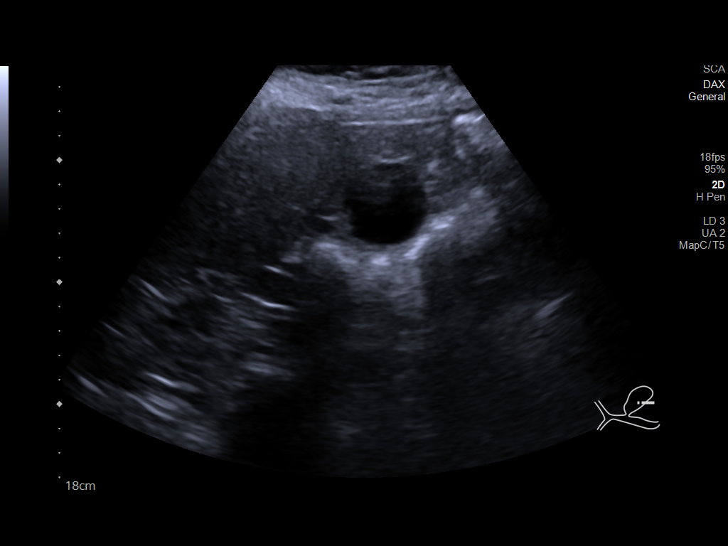
[im 21/45]
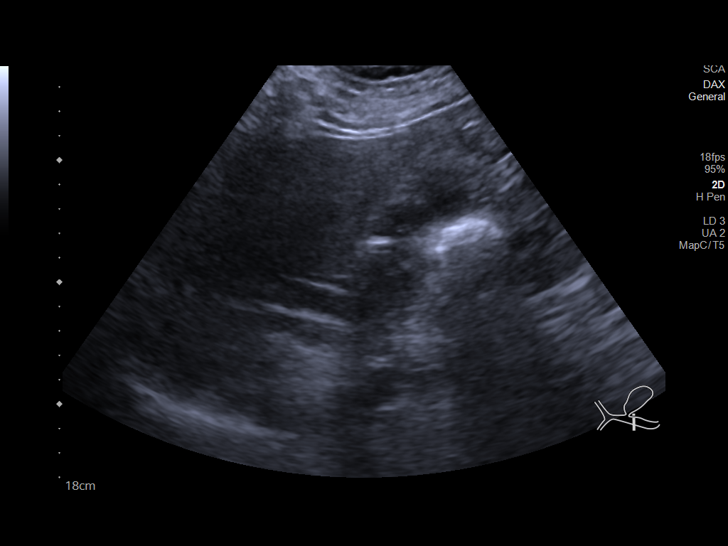
[im 24/45]
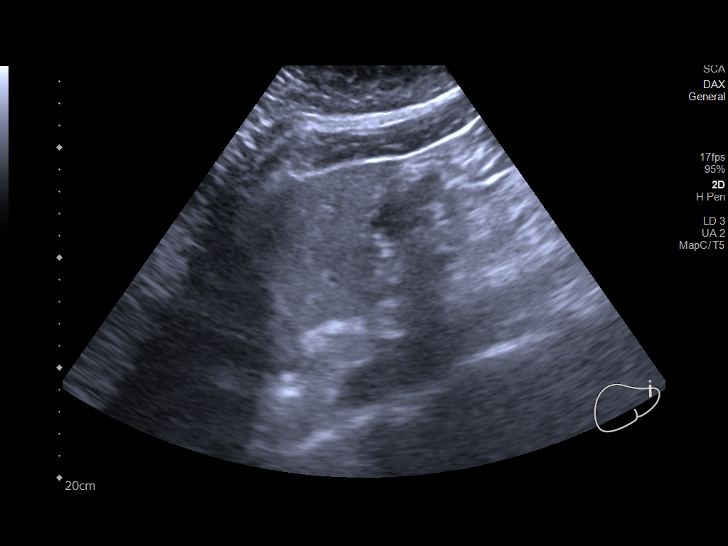
[im 28/45]
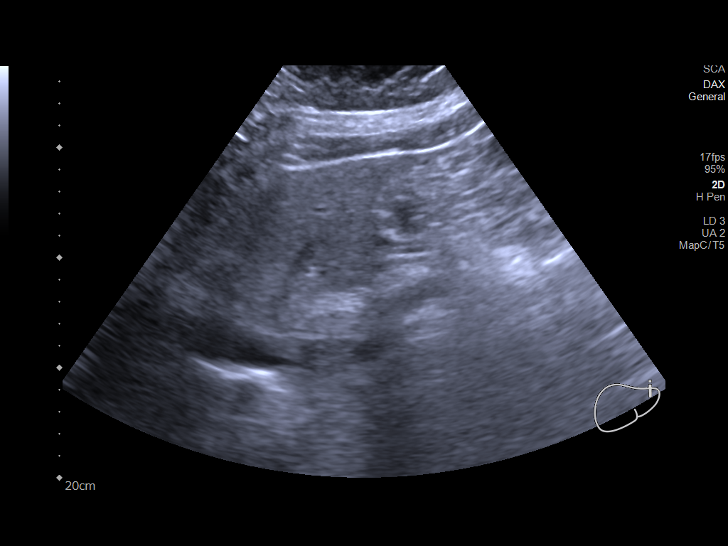
[im 30/45]
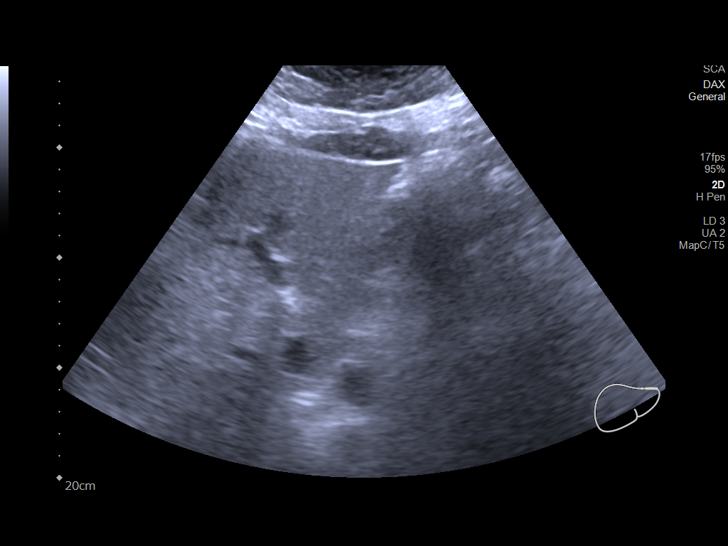
[im 34/45]
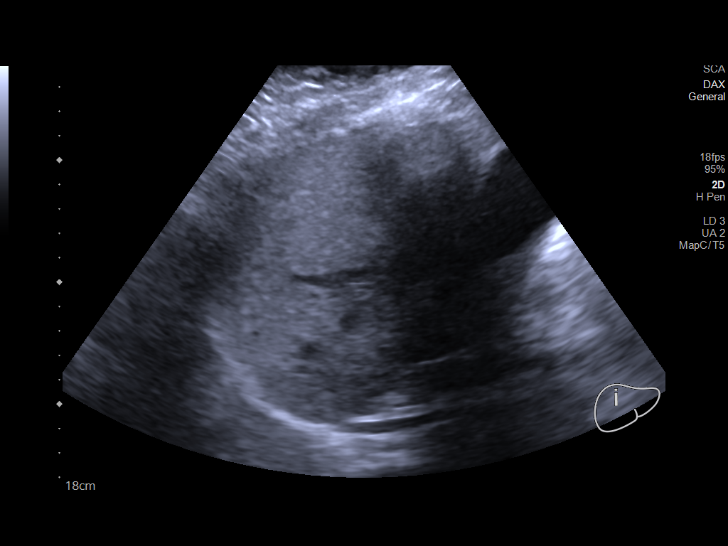
[im 37/45]
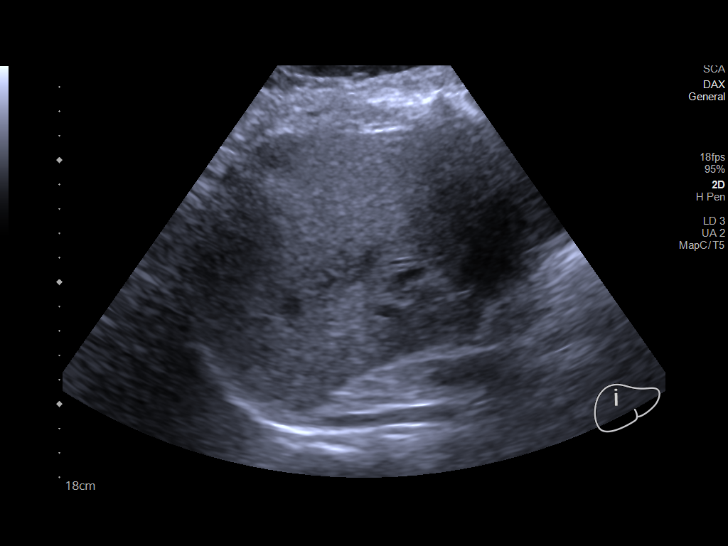
[im 41/45]
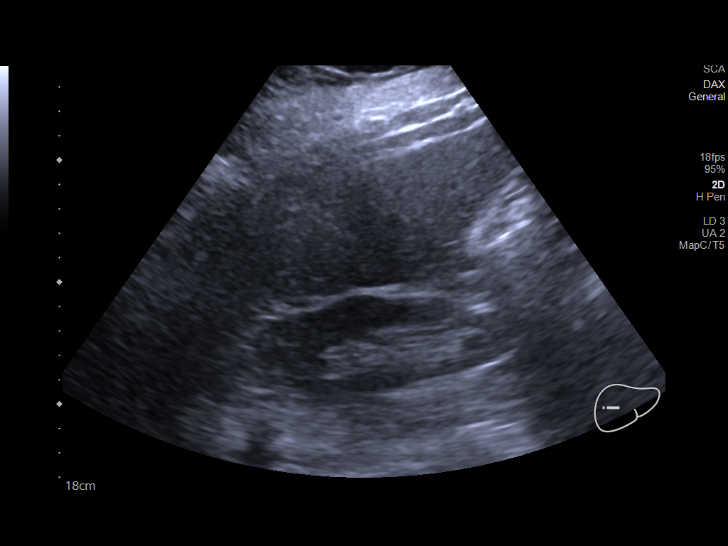
[im 45/45]
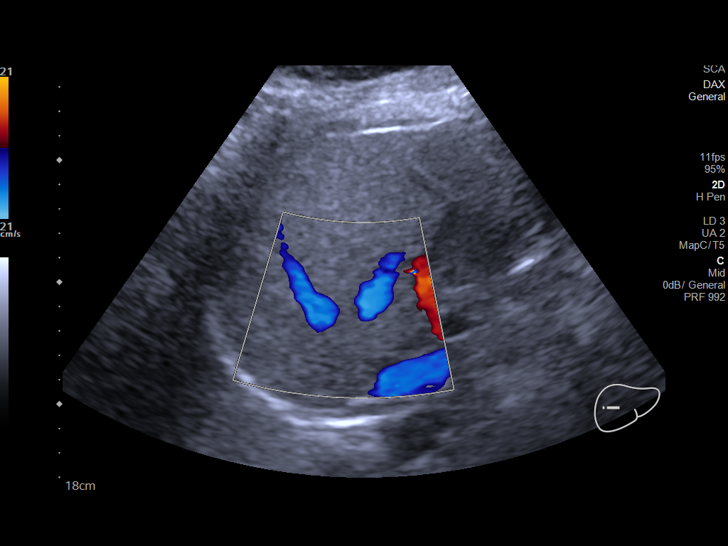

[14 of 25 positions shown; findings below may reference images not displayed]

FINDINGS: Gallbladder:

A few dependent echogenic shadowing calculi measuring up to 1 cm in
size. No significant wall thickening or pericholecystic fluid
visualized. Positive sonographic Murphy's sign per the technologist.

Common bile duct:

Diameter: 10 mm

Liver:

Coarse, increased echogenicity of the parenchyma with no focal mass
identified. Portal vein is patent on color Doppler imaging with
normal direction of blood flow towards the liver.

Other: None.
IMPRESSION: 1. Cholelithiasis with positive sonographic Murphy's sign per the
technologist, suggesting acute calculus cholecystitis.
2. Dilated common bile duct, correlate for evidence of obstruction
and consider MRCP if indicated.
3. Evidence of hepatic steatosis.

## 2021-03-10 MED ORDER — PIPERACILLIN-TAZOBACTAM 3.375 G IVPB 30 MIN
3.3750 g | Freq: Once | INTRAVENOUS | Status: AC
  Administered 2021-03-10: 14:00:00 3.375 g via INTRAVENOUS
  Filled 2021-03-10: qty 50

## 2021-03-10 MED ORDER — SODIUM CHLORIDE 0.9 % IV SOLN
25.0000 mg | Freq: Once | INTRAVENOUS | Status: AC
  Administered 2021-03-10: 11:00:00 25 mg via INTRAVENOUS
  Filled 2021-03-10: qty 1

## 2021-03-10 MED ORDER — PIPERACILLIN-TAZOBACTAM 3.375 G IVPB
3.3750 g | Freq: Three times a day (TID) | INTRAVENOUS | Status: DC
  Administered 2021-03-11 – 2021-03-13 (×7): 3.375 g via INTRAVENOUS
  Administered 2021-03-13: 09:00:00 50 mL via INTRAVENOUS
  Administered 2021-03-13 – 2021-03-18 (×16): 3.375 g via INTRAVENOUS
  Filled 2021-03-10 (×26): qty 50

## 2021-03-10 MED ORDER — SODIUM CHLORIDE 0.9 % IV SOLN: INTRAVENOUS | Status: DC | PRN

## 2021-03-10 MED ORDER — SODIUM CHLORIDE 0.9 % IV BOLUS
1000.0000 mL | Freq: Once | INTRAVENOUS | Status: AC
Start: 2021-03-10 — End: 2021-03-10
  Administered 2021-03-10: 11:00:00 1000 mL via INTRAVENOUS

## 2021-03-10 MED ORDER — PROMETHAZINE HCL 25 MG/ML IJ SOLN
INTRAMUSCULAR | Status: AC
  Filled 2021-03-10: qty 1

## 2021-03-10 NOTE — Telephone Encounter (Signed)
FYI Dr Ardis Hughs see the note from the pt regarding ED visit and vomiting.

## 2021-03-10 NOTE — ED Notes (Signed)
Patient transported to [facility] via [facility] at this time

## 2021-03-10 NOTE — Telephone Encounter (Signed)
Patient called stating that she had been throwing up the last 2 days, orange urine and white stool. Patient stated that she was going to go to the ER but wanted to inform Dr. Ardis Hughs of the change.

## 2021-03-10 NOTE — Progress Notes (Signed)
Notified by EDP of need for admission d/t biliary obstruction w/ acute cholecystitis. [facility] accepts patient to tele bed at [facility]. EDP is to remain responsible for orders/medical decisions while patient is holding at [facility]. Upon arrival to [facility], [facility] will assume care. Nursing staff will call patient placement to notify them of patient's arrival so that the proper [facility] member may receive the patient. Nursing staff will notify the following consultants, [facility], of patient's arrival for their evaluation. Thank you.

## 2021-03-10 NOTE — Assessment & Plan Note (Addendum)
2/2 choledocholithiasis Negative acute hepatitis panel Improving, follow

## 2021-03-10 NOTE — Telephone Encounter (Signed)
Got it!     Thanks =).

## 2021-03-10 NOTE — Consult Note (Addendum)
Referring Provider: Dr. Cherylann Ratel  Primary Care Physician:  Tisovec, Fransico Him, MD Primary Gastroenterologist:  Dr. Owens Loffler   Reason for Consultation:  Biliary obstruction  HPI: Lynn Simpson is a 50 y.o. female with a past medical history of anxiety, depression, asthma, preventative bilateral mastectomies 03/2016, Lynch syndrome, GERD, diarrhea predominant IBS and colon polyps.  She has recurrent LUQ pain with nausea and vomiting which has aggressively worsened over the past 2 months.  Monday 03/08/2021 she vomited up nonbloody partially digested food around 5 PM followed by numerous episodes of vomiting foamy emesis until 3 AM the next morning. She continued to have LUQ pain. Tuesday 12/20 she developed a cough with clear sputum and she presented to the urgent care and she was diagnosed with a viral URI.  She received a nebulizer, antibiotics were not prescribed.  She continued to have nausea later that evening then developed projectile vomiting which she stated was different than her typical vomiting episodes.  No hematemesis.  Her LUQ pain radiated across to the RUQ.  She noticed her urine was dark orange in color and she passed a white-colored stool x 2. She contacted our office and she was advised to present to the ED for further evaluation as her symptoms were concerning for a biliary obstruction.  She presented to [facility] ED 03/10/2021.  Labs in the ED showed an elevated alk phos level of 207.  Total bili 2.8.  AST 604.  ALT 888.  Lipase 26.  Acute hepatitis panel pending.  WBC 6.2.  Hemoglobin 15.3.  Hematocrit 45.3.  Platelet 180.  INR 1.0.  SARS coronavirus 2 negative.  Blood cultures x 2 drawn.  RUQ sonogram identified cholelithiasis with a positive sonographic Murphy sign concerning for acute cholecystitis with a dilated common bile duct concerning for biliary obstruction.  Hepatic steatosis was noted.  She was transferred to [facility] last night for further  evaluation to include an abdominal MRI/MRCP, ERCP and possible cholecystectomy.  An abdominal MRI/MRCP w/wo contrast identified cholelithiasis without evidence of acute cholecystitis with choledocholithiasis with at least 3 small calculi within the distal common bile duct.  Mild superimposed extrahepatic biliary ductal dilatation was also noted.  She was started on Zosyn, Promethazine and IV fluids.  She remains NPO.  Repeat laboratory studies this morning show an Alk phos level of 188.  Total bili 3.4.  Direct bili 1.7.  AST 353.  ALT 16.  Potassium 3.3.  WBC 6.3.  Hemoglobin 14.4.  Currently, her nausea and vomiting are controlled.  No significant LUQ or RUQ pain at this time.  No dysphagia.  Infrequent heartburn for which she takes Famotidine 34m QD.  No bowel movement since admission.  No chest pain, palpitations or shortness of breath.  No NSAID use.  She drinks 1 or 2 mixed cocktails drinks on the weekends.  [facility] for follow up regarding LUQ pain. At that time, she reported having 3 episodes of N/V/D over the past month. An EGD and colonoscopy were ordered after cardiac clearance obtained due to several episodes of reported syncope).  Labs 12/31/2020 showed a normal Alk phos level 91. T. Bili 0.3. AST 19. ALT 17. Lipase 46.  Abdominal/pelvic CT with contrast 01/04/2021 documented post cholecystectomy (likely dictation error as patient has a gallbladder) without substantial biliary ductal distention or liver lesions.  An EGD to further evaluate her ongoing upper GI symptoms and surveillance colonoscopy due to having  Lynch syndrome with associated colon polyps was scheduled with Dr. Ardis Hughs on 04/12/2021.  IMAGE STUDIES:  Abdominal MRI with and without contrast 03/11/2021:  CTAP with contrast 01/04/2021: No acute findings in the abdomen or pelvis.  Hepatic steatosis. Scattered colonic diverticulosis both RIGHT and LEFT-sided.  Small fat containing umbilical  hernia.  RUQ sonogram 03/10/2021: 1. Cholelithiasis with positive sonographic Murphy's sign per the technologist, suggesting acute calculus cholecystitis. 2. Dilated common bile duct, correlate for evidence of obstruction and consider MRCP if indicated. 3. Evidence of hepatic steatosis.  CTAP with contrast 01/04/2021: No acute findings in the abdomen or pelvis.  Hepatic steatosis. Scattered colonic diverticulosis both RIGHT and LEFT-sided.  Small fat containing umbilical hernia   PAST GI PROCEDURES:  EGD 01/01/2020 - Small hiatal hernia. - The examination was otherwise normal. - Recall EGD in 3 years.  Colonoscopy 01/01/2020: - Diverticulosis in the entire examined colon. - The examination was otherwise normal on direct and retroflexion views. - No polyps or cancers. - Colonoscopy recall 1 year  Colonoscopy 08/28/2018: One 1 mm tubular adenomatous polyp in the transverse colon, removed with a cold biopsy forceps. Resected and retrieved. - One 3 mm tubular adenomatous polyp in the sigmoid colon, removed with a cold snare. Resected and retrieved. - Diverticulosis in the left colon. - The examination was otherwise normal on direct and retroflexion views.  Colonoscopy 07/25/2017: -Four 1 to 3 mm polyps in the sigmoid colon, in the descending colon and in the ascending colon, removed with a cold snare. Resected and retrieved. - The examination was otherwise normal on direct and retroflexion views. - 3 of the polyps were adenomas and one was hyperplastic  EGD 05/18/2016: - LA Grade B reflux esophagitis. - Small to medium-sized hiatal hernia. - Small to medium-sized hiatal hernia. - Normal examined duodenum. - No specimens collected.  Colonoscopy 05/18/2016: - Two 5 to 10 mm polyps in the sigmoid colon, removed with a hot snare. Resected and retrieved. - Five 3 to 6 mm polyps in the sigmoid colon, in the descending colon, in the transverse colon, in the ascending colon and in  the cecum, removed with a cold snare. Resected and retrieved. - Diverticulosis. - The examination was otherwise normal on direct and retroflexion views. 1. Surgical [P], transverse, ascending and cecum, descending, polyp (4) - MULTIPLE FRAGMENTS OF SESSILE SERRATED POLYPS/ADENOMAS. - HYPERPLASTIC POLYP(S). - NO CYTOLOGIC DYSPLASIA OR INVASIVE MALIGNANCY IDENTIFIED. 2. Surgical [P], sigmoid, polyp (3) - TUBULAR ADENOMA AND HYPERPLASTIC POLYPS. - NO HIGH GRADE DYSPLASIA OR INVASIVE MALIGNANCY IDENTIFIED   Past Medical History:  Diagnosis Date   Allergy    Anxiety    Asthma    Back pain    Cancer ([place]) 2018   Left Breast   COVID-19 virus infection 11/2019   Depression    Dyspnea    Family history of breast cancer    Family history of melanoma    Family history of ovarian cancer    Gallstones    GERD (gastroesophageal reflux disease)    IBS (irritable bowel syndrome)    Lower extremity edema    Lynch syndrome    Lynch syndrome    Migraine    Obesity    Ovarian cyst    Vitamin D deficiency     Past Surgical History:  Procedure Laterality Date   ANKLE FRACTURE SURGERY Right 12/2018   BREAST RECONSTRUCTION WITH PLACEMENT OF TISSUE EXPANDER AND FLEX HD (ACELLULAR HYDRATED DERMIS) Bilateral 03/31/2016   Procedure: BILATERAL BREAST RECONSTRUCTION WITH  PLACEMENT OF TISSUE EXPANDER AND FLEX HD (ACELLULAR HYDRATED DERMIS);  Surgeon: Wallace Going, DO;  Location: [place];  Service: Plastics;  Laterality: Bilateral;   CESAREAN SECTION  2005   COLONOSCOPY     LAPAROSCOPIC VAGINAL HYSTERECTOMY WITH SALPINGO OOPHORECTOMY Bilateral 09/15/2016   Procedure: LAPAROSCOPIC ASSISTED VAGINAL HYSTERECTOMY WITH BILATERAL SALPINGO OOPHORECTOMY;  Surgeon: Molli Posey, MD;  Location: [facility];  Service: Gynecology;  Laterality: Bilateral;   LIPOSUCTION Bilateral 06/30/2016   Procedure: LIPOSUCTION TO LATERAL CHEST;  Surgeon: Wallace Going, DO;  Location: [place];  Service: Plastics;  Laterality: Bilateral;   NIPPLE SPARING MASTECTOMY Bilateral 03/31/2016   Procedure: BILATERAL NIPPLE SPARING MASTECTOMIES;  Surgeon: Autumn Messing III, MD;  Location: [place];  Service: General;  Laterality: Bilateral;   POLYPECTOMY     REMOVAL OF BILATERAL TISSUE EXPANDERS WITH PLACEMENT OF BILATERAL BREAST IMPLANTS Bilateral 06/30/2016   Procedure: REMOVAL OF BILATERAL TISSUE EXPANDERS WITH PLACEMENT OF BILATERAL SILCONE BREAST IMPLANTS;  Surgeon: Wallace Going, DO;  Location: [place];  Service: Plastics;  Laterality: Bilateral;   WISDOM TOOTH EXTRACTION      Prior to Admission medications   Medication Sig Start Date End Date Taking? Authorizing Provider  celecoxib (CELEBREX) 200 MG capsule Take 200 mg by mouth as needed. PT TAKING AS NEEDED 12/05/19   [provider]  famotidine (PEPCID) 40 MG tablet Take 1 tablet (40 mg total) by mouth daily. 02/03/21   Noralyn Pick, NP  hyoscyamine (LEVSIN SL) 0.125 MG SL tablet Place 1 tablet (0.125 mg total) under the tongue every 6 (six) hours as needed. 12/31/20   Noralyn Pick, NP  ondansetron (ZOFRAN-ODT) 8 MG disintegrating tablet Dissolve one tablet on the tongue every 8 hours as needed for nausea and vomiting. 02/03/21   Noralyn Pick, NP  promethazine (PHENERGAN) 12.5 MG tablet Take 1 tablet (12.5 mg total) by mouth 2 (two) times daily as needed for nausea or vomiting. 02/03/21   Noralyn Pick, NP  Sod Picosulfate-Mag Ox-Cit Acd [facility]) 10-3.5-12 MG-GM -GM/160ML SOLN Take as split dose prep following the doctor's instructions 02/10/21   Noralyn Pick, NP    Current Facility-Administered Medications  Medication Dose Route Frequency Provider Last Rate Last Admin   0.9 %  sodium chloride infusion   Intravenous PRN Eustaquio Maize, PA-C 10 mL/hr at 03/10/21 1037 New Bag at 03/10/21 1037   promethazine (PHENERGAN)  25 MG/ML injection            Current Outpatient Medications  Medication Sig Dispense Refill   celecoxib (CELEBREX) 200 MG capsule Take 200 mg by mouth as needed. PT TAKING AS NEEDED     famotidine (PEPCID) 40 MG tablet Take 1 tablet (40 mg total) by mouth daily. 90 tablet 1   hyoscyamine (LEVSIN SL) 0.125 MG SL tablet Place 1 tablet (0.125 mg total) under the tongue every 6 (six) hours as needed. 30 tablet 1   ondansetron (ZOFRAN-ODT) 8 MG disintegrating tablet Dissolve one tablet on the tongue every 8 hours as needed for nausea and vomiting. 30 tablet 1   promethazine (PHENERGAN) 12.5 MG tablet Take 1 tablet (12.5 mg total) by mouth 2 (two) times daily as needed for nausea or vomiting. 30 tablet 1   Sod Picosulfate-Mag Ox-Cit Acd (CLENPIQ) 10-3.5-12 MG-GM -GM/160ML SOLN Take as split dose prep following the doctor's instructions 320 mL 0    Allergies as of 03/10/2021 - Review Complete 03/10/2021  Allergen Reaction Noted   Bee venom Anaphylaxis 09/30/2015   Wellbutrin [bupropion] Anaphylaxis 06/12/2013    Family History  Problem Relation Age of Onset   Breast cancer Mother 70   Liver cancer Mother    Alcoholism Mother    Lung cancer Father 82   Breast cancer Sister 76   Breast cancer Maternal Grandmother    Ovarian cancer Maternal Grandmother    Colon cancer Maternal Grandmother    Cancer Paternal Grandfather        NOS   Melanoma Maternal Aunt        mother's maternal half sister   Colon polyps Neg Hx    Rectal cancer Neg Hx    Stomach cancer Neg Hx    Esophageal cancer Neg Hx     Social History   Socioeconomic History   Marital status: Married    Spouse name: Eydie Wormley   Number of children: 3   Years of education: Not on file   Highest education level: Not on file  Occupational History   Occupation: Sales  Tobacco Use   Smoking status: Every Day    Packs/day: 0.50    Years: 20.00    Pack years: 10.00    Types: Cigarettes    Last attempt to quit: 12/20/2015     Years since quitting: 5.2   Smokeless tobacco: Never   Tobacco comments:    form given [id number]  Vaping Use   Vaping Use: Never used  Substance and Sexual Activity   Alcohol use: Yes    Comment: occ   Drug use: No   Sexual activity: Yes    Birth control/protection: I.U.D., Post-menopausal    Comment: IUD REMOVED 09/07/16  Other Topics Concern   Not on file  Social History Narrative   Not on file   Social Determinants of Health   Financial Resource Strain: Not on file  Food Insecurity: Not on file  Transportation Needs: Not on file  Physical Activity: Not on file  Stress: Not on file  Social Connections: Not on file  Intimate Partner Violence: Not on file   Review of Systems: Gen: Denies fever, sweats or chills. No weight loss.  CV: Denies chest pain, palpitations or edema. Resp: See HPI.  GI: See HPI. GU : + Dark colored urine.  MS: Denies joint pain, muscles aches or weakness. Derm: Denies rash, itchiness, skin lesions or unhealing ulcers. Psych: Denies depression, anxiety or memory loss. Heme: Denies easy bruising, bleeding. Neuro:  Denies headaches, dizziness or paresthesias. Endo:  Denies any problems with DM, thyroid or adrenal function.  Physical Exam: Vital signs in last 24 hours: Temp:  [98.4 F (36.9 C)] 98.4 F (36.9 C) (12/21 0950) Pulse Rate:  [71-105] 71 (12/21 1300) Resp:  [16] 16 (12/21 1300) BP: (115-149)/(68-89) 115/69 (12/21 1300) SpO2:  [95 %-98 %] 97 % (12/21 1300) Weight:  [115.7 kg] 115.7 kg (12/21 0949)   General:  Alert 50 year old female in no acute distress. Head:  Normocephalic and atraumatic. Eyes:  No scleral icterus. Conjunctiva pink. Ears:  Normal auditory acuity. Nose:  No deformity, discharge or lesions. Mouth:  Dentition intact. No ulcers or lesions.  Neck:  Supple. No lymphadenopathy or thyromegaly.  Lungs: Breath sounds mildly diminished throughout, no wheezes, crackles or rhonchi. Heart: Regular rate and rhythm, no  murmurs. Abdomen: Soft, nondistended.  Mild RUQ tenderness without rebound or guarding.  Positive bowel sounds all 4 quadrants.  No hepatosplenomegaly appreciated. Rectal: Deferred. Musculoskeletal:  Symmetrical without gross deformities.  Pulses:  Normal pulses noted. Extremities:  Without clubbing or edema. Neurologic:  Alert and  oriented x4. No focal deficits.  Skin:  Intact without significant lesions or rashes.  No obvious jaundice. Psych:  Alert and cooperative. Normal mood and affect.  Intake/Output from previous day: No intake/output data recorded. Intake/Output this shift: Total I/O In: 1050.5 [IV Piggyback:1050.5] Out: -   Lab Results: Recent Labs    03/10/21 1021  WBC 6.2  HGB 15.3*  HCT 45.3  PLT 180   BMET Recent Labs    03/10/21 1021  NA 137  K 3.6  CL 101  CO2 27  GLUCOSE 99  BUN 12  CREATININE 0.52  CALCIUM 8.9   LFT Recent Labs    03/10/21 1021  PROT 7.7  ALBUMIN 4.1  AST 604*  ALT 888*  ALKPHOS 207*  BILITOT 2.8*   PT/INR Recent Labs    03/10/21 1211  LABPROT 13.2  INR 1.0   Hepatitis Panel No results for input(s): HEPBSAG, HCVAB, HEPAIGM, HEPBIGM in the last 72 hours.   Studies/Results: US Abdomen Limited RUQ (LIVER/GB)  Result Date: 03/10/2021 CLINICAL DATA:  Right upper quadrant pain EXAM: ULTRASOUND ABDOMEN LIMITED RIGHT UPPER QUADRANT COMPARISON:  Abdominal ultrasound 07/28/2017 FINDINGS: Gallbladder: A few dependent echogenic shadowing calculi measuring up to 1 cm in size. No significant wall thickening or pericholecystic fluid visualized. Positive sonographic Murphy's sign per the technologist. Common bile duct: Diameter: 10 mm Liver: Coarse, increased echogenicity of the parenchyma with no focal mass identified. Portal vein is patent on color Doppler imaging with normal direction of blood flow towards the liver. Other: None. IMPRESSION: 1. Cholelithiasis with positive sonographic Murphy's sign per the technologist, suggesting  acute calculus cholecystitis. 2. Dilated common bile duct, correlate for evidence of obstruction and consider MRCP if indicated. 3. Evidence of hepatic steatosis. Electronically Signed   By: Ofilia Neas M.D.   On: 03/10/2021 12:11    IMPRESSION/PLAN:  51) 50 year old female with a 2 month history of recurrent N/V and LUQ pain admitted to the hospital 03/10/2021 with worsening N/V, LUQ and RUQ pain with the development of dark colored urine and acholic stools x 2 days. Labs in the ED consistent with acute cholestasis concerning for biliary obstruction. RUQ sonogram identified gallstones with a dilated CBD choledocholithiasis/biliary obstruction. She was noted to have a positive sonographic Murphy sign concerning for acute cholecystitis.  Abdominal MRI/MRCP this am showed cholelithiasis without acute cholecystitis and choledocholithiasis with at least 3 small calculi in the distal common bile duct with mild superimposed extrahepatic biliary ductal dilatation. -Stat abdominal MRI/MCP w/wo contrast  -Continue IV Zosyn  -IV fluids  -NPO -ERCP benefits and risks discussed including risk with sedation, risk of bleeding, perforation, pancreatitis and infection. ERCP possibly this afternoon or tomorrow with Dr. Carmin Muskrat -Hepatic panel, BMP, CBC, INR in am -Recommend general surgery consult, she will likely require a cholecystectomy during this hospital admission  -Await further recommendations from Dr. Lorenso Courier  2) History of Lynch Syndrome -Surveillance colonoscopy scheduled as an outpatient on 04/12/2020  ADDENDUM: ERCP scheduled 03/12/2021 with Dr. Rush Landmark. Ok to be on clear liquids today then NPO after midnight. Patient informed ERCP will be done tomorrow.    Lynn Simpson  03/10/2021, 08:54 AM

## 2021-03-10 NOTE — ED Notes (Signed)
Report given to [facility].  All questions answered

## 2021-03-10 NOTE — H&P (Signed)
History and Physical    Lynn Simpson FYB:[id number] DOB: 03-22-1970 DOA: 03/10/2021  PCP: Haywood Pao, MD   Patient coming from: Home  I have personally briefly reviewed patient's old medical records in [place]  CC: RUQ pain, acholic stools HPI: 50 year old female with a prior history of Lynch syndrome, history of prophylactic bilateral mastectomy due to family history of breast cancer who presents to the ER today with a 2-day history of persistent nausea, vomiting.  Patient's had orange-colored urine for the last couple days.  She thought this was coming from some of the vitamins and over-the-counter products she was taking for an upper respiratory infection.  She also noted acholic stools on Monday which concerned her.  She has been in contact with [facility] as she sees Dr. Ardis Hughs on a frequent basis.  She has had several months of episodic abdominal pain.  She was getting scheduled for a EGD and colonoscopy next month.  In the ER, she was noted to be afebrile but mildly tachycardic.  Labs showed a white count of 6.2.  Chemistry showed a AST of 600 ALT of 800 alk phos of 207 and a total bili of 2.8.  INR was normal at 1.0.  Acute hepatitis panel was negative.  Ultrasound of the abdomen demonstrated cholelithiasis with the largest measuring 1 cm.  There is a positive sonographic Murphy sign.  Common bile abdominal diameter was 10 mm.  Due to concern for acute cholecystitis with choledocholithiasis, patient transferred to [facility] hospital.  [facility] was consulted over the phone.  Patient scheduled for an MRCP.   ED Course: RUQ U/S showed gallstones. CBD 16m. Started on IV zosyn.  Review of Systems:  Review of Systems  Constitutional:  Positive for malaise/fatigue.  HENT:  Positive for congestion and sore throat.   Eyes: Negative.   Respiratory:  Positive for cough.   Cardiovascular: Negative.   Gastrointestinal:  Positive for abdominal pain, nausea and  vomiting.       Acholic stools  Genitourinary:        Orange-colored urine  Musculoskeletal:  Positive for back pain.  Skin: Negative.   Neurological: Negative.   Endo/Heme/Allergies: Negative.   All other systems reviewed and are negative.  Past Medical History:  Diagnosis Date   Allergy    Anxiety    Asthma    Back pain    [place] [place]   Left Breast   COVID-19 virus infection 11/2019   Depression    Dyspnea    Family history of breast cancer    Family history of melanoma    Family history of ovarian cancer    Gallstones    GERD (gastroesophageal reflux disease)    IBS (irritable bowel syndrome)    Lower extremity edema    Lynch syndrome    Lynch syndrome    Migraine    Obesity    Ovarian cyst    Vitamin D deficiency     Past Surgical History:  Procedure Laterality Date   ANKLE FRACTURE SURGERY Right 12/2018   BREAST RECONSTRUCTION WITH PLACEMENT OF TISSUE EXPANDER AND FLEX HD (ACELLULAR HYDRATED DERMIS) Bilateral 03/31/2016   Procedure: BILATERAL BREAST RECONSTRUCTION WITH PLACEMENT OF TISSUE EXPANDER AND FLEX HD (ACELLULAR HYDRATED DERMIS);  Surgeon: CWallace Going DO;  Location: [facility]  Service: Plastics;  Laterality: Bilateral;   CESAREAN SECTION  2005   COLONOSCOPY     LAPAROSCOPIC VAGINAL HYSTERECTOMY WITH SALPINGO OOPHORECTOMY Bilateral 09/15/2016  Procedure: LAPAROSCOPIC ASSISTED VAGINAL HYSTERECTOMY WITH BILATERAL SALPINGO OOPHORECTOMY;  Surgeon: Molli Posey, MD;  Location: [facility];  Service: Gynecology;  Laterality: Bilateral;   LIPOSUCTION Bilateral 06/30/2016   Procedure: LIPOSUCTION TO LATERAL CHEST;  Surgeon: Wallace Going, DO;  Location: [place];  Service: Plastics;  Laterality: Bilateral;   NIPPLE SPARING MASTECTOMY Bilateral 03/31/2016   Procedure: BILATERAL NIPPLE SPARING MASTECTOMIES;  Surgeon: Autumn Messing III, MD;  Location: [place];  Service: General;  Laterality: Bilateral;    POLYPECTOMY     REMOVAL OF BILATERAL TISSUE EXPANDERS WITH PLACEMENT OF BILATERAL BREAST IMPLANTS Bilateral 06/30/2016   Procedure: REMOVAL OF BILATERAL TISSUE EXPANDERS WITH PLACEMENT OF BILATERAL SILCONE BREAST IMPLANTS;  Surgeon: Wallace Going, DO;  Location: [place];  Service: Plastics;  Laterality: Bilateral;   WISDOM TOOTH EXTRACTION       reports that she has been smoking cigarettes. She has a 10.00 pack-year smoking history. She has never used smokeless tobacco. She reports current alcohol use. She reports that she does not use drugs.  Allergies  Allergen Reactions   Bee Venom Anaphylaxis   Wellbutrin [Bupropion] Anaphylaxis    Family History  Problem Relation Age of Onset   Breast cancer Mother 6   Liver cancer Mother    Alcoholism Mother    Lung cancer Father 62   Breast cancer Sister 86   Breast cancer Maternal Grandmother    Ovarian cancer Maternal Grandmother    Colon cancer Maternal Grandmother    Cancer Paternal Grandfather        NOS   Melanoma Maternal Aunt        mother's maternal half sister   Colon polyps Neg Hx    Rectal cancer Neg Hx    Stomach cancer Neg Hx    Esophageal cancer Neg Hx     Prior to Admission medications   Medication Sig Start Date End Date Taking? Authorizing Provider  celecoxib (CELEBREX) 200 MG capsule Take 200 mg by mouth as needed. PT TAKING AS NEEDED 12/05/19   [provider]  famotidine (PEPCID) 40 MG tablet Take 1 tablet (40 mg total) by mouth daily. 02/03/21   Noralyn Pick, NP  hyoscyamine (LEVSIN SL) 0.125 MG SL tablet Place 1 tablet (0.125 mg total) under the tongue every 6 (six) hours as needed. 12/31/20   Noralyn Pick, NP  ondansetron (ZOFRAN-ODT) 8 MG disintegrating tablet Dissolve one tablet on the tongue every 8 hours as needed for nausea and vomiting. 02/03/21   Noralyn Pick, NP  promethazine (PHENERGAN) 12.5 MG tablet Take 1 tablet (12.5 mg total) by  mouth 2 (two) times daily as needed for nausea or vomiting. 02/03/21   Noralyn Pick, NP  Sod Picosulfate-Mag Ox-Cit [facility]) 10-3.5-12 MG-GM -GM/160ML SOLN Take as split dose prep following the doctor's instructions 02/10/21   Noralyn Pick, NP    Physical Exam: Vitals:   03/10/21 1300 03/10/21 1700 03/10/21 2002 03/10/21 2250  BP: 115/69 110/67 (!) 142/71 122/77  Pulse: 71 81 78 85  Resp: '16 11 17 18  ' Temp:    98 F (36.7 C)  TempSrc:    Oral  SpO2: 97% 99% 94% 96%  Weight:      Height:        Physical Exam Vitals and nursing note reviewed.  Constitutional:      General: She is not in acute distress.    Appearance: Normal appearance. She is obese. She is  not ill-appearing, toxic-appearing or diaphoretic.  HENT:     Head: Normocephalic and atraumatic.     Nose: Nose normal. No rhinorrhea.  Eyes:     General: No scleral icterus.       Right eye: No discharge.        Left eye: No discharge.  Cardiovascular:     Rate and Rhythm: Normal rate and regular rhythm.     Pulses: Normal pulses.  Pulmonary:     Effort: Pulmonary effort is normal. No respiratory distress.     Breath sounds: Normal breath sounds. No wheezing or rales.  Abdominal:     General: Bowel sounds are normal. There is no distension.     Palpations: There is no mass.     Tenderness: There is abdominal tenderness in the right upper quadrant. There is no guarding or rebound.     Comments: Mild RUQ tenderness  Musculoskeletal:     Right lower leg: No edema.     Left lower leg: No edema.  Skin:    General: Skin is warm and dry.     Capillary Refill: Capillary refill takes less than 2 seconds.  Neurological:     General: No focal deficit present.     Mental Status: She is alert and oriented to person, place, and time.     Labs on Admission: I have personally reviewed following labs and imaging studies  CBC: Recent Labs  Lab 03/10/21 1021  WBC 6.2  NEUTROABS 3.6  HGB 15.3*   HCT 45.3  MCV 89.7  PLT 211   Basic Metabolic Panel: Recent Labs  Lab 03/10/21 1021  NA 137  K 3.6  CL 101  CO2 27  GLUCOSE 99  BUN 12  CREATININE 0.52  CALCIUM 8.9   GFR: Estimated Creatinine Clearance: 103.6 mL/min (by C-G formula based on SCr of 0.52 mg/dL). Liver Function Tests: Recent Labs  Lab 03/10/21 1021  AST 604*  ALT 888*  ALKPHOS 207*  BILITOT 2.8*  PROT 7.7  ALBUMIN 4.1   Recent Labs  Lab 03/10/21 1021  LIPASE 26   No results for input(s): AMMONIA in the last 168 hours. Coagulation Profile: Recent Labs  Lab 03/10/21 1211  INR 1.0   Cardiac Enzymes: No results for input(s): CKTOTAL, CKMB, CKMBINDEX, TROPONINI in the last 168 hours. BNP (last 3 results) No results for input(s): PROBNP in the last 8760 hours. HbA1C: No results for input(s): HGBA1C in the last 72 hours. CBG: No results for input(s): GLUCAP in the last 168 hours. Lipid Profile: No results for input(s): CHOL, HDL, LDLCALC, TRIG, CHOLHDL, LDLDIRECT in the last 72 hours. Thyroid Function Tests: No results for input(s): TSH, T4TOTAL, FREET4, T3FREE, THYROIDAB in the last 72 hours. Anemia Panel: No results for input(s): VITAMINB12, FOLATE, FERRITIN, TIBC, IRON, RETICCTPCT in the last 72 hours. Urine analysis:    Component Value Date/Time   COLORURINE AMBER (A) 03/10/2021 1006   APPEARANCEUR CLEAR 03/10/2021 1006   LABSPEC >=1.030 03/10/2021 1006   PHURINE 6.0 03/10/2021 1006   GLUCOSEU 100 (A) 03/10/2021 1006   HGBUR NEGATIVE 03/10/2021 1006   BILIRUBINUR LARGE (A) 03/10/2021 1006   KETONESUR NEGATIVE 03/10/2021 1006   PROTEINUR 30 (A) 03/10/2021 1006   UROBILINOGEN 0.2 09/12/2009 0256   NITRITE NEGATIVE 03/10/2021 1006   LEUKOCYTESUR NEGATIVE 03/10/2021 1006    Radiological Exams on Admission: I have personally reviewed images US Abdomen Limited RUQ (LIVER/GB)  Result Date: 03/10/2021 CLINICAL DATA:  Right upper quadrant pain EXAM: ULTRASOUND  ABDOMEN LIMITED RIGHT  UPPER QUADRANT COMPARISON:  Abdominal ultrasound 07/28/2017 FINDINGS: Gallbladder: A few dependent echogenic shadowing calculi measuring up to 1 cm in size. No significant wall thickening or pericholecystic fluid visualized. Positive sonographic Murphy's sign per the technologist. Common bile duct: Diameter: 10 mm Liver: Coarse, increased echogenicity of the parenchyma with no focal mass identified. Portal vein is patent on color Doppler imaging with normal direction of blood flow towards the liver. Other: None. IMPRESSION: 1. Cholelithiasis with positive sonographic Murphy's sign per the technologist, suggesting acute calculus cholecystitis. 2. Dilated common bile duct, correlate for evidence of obstruction and consider MRCP if indicated. 3. Evidence of hepatic steatosis. Electronically Signed   By: Ofilia Neas M.D.   On: 03/10/2021 12:11    EKG: I have personally reviewed EKG: no EKG performed  Assessment/Plan Principal Problem:   Choledocholithiasis with acute cholecystitis Active Problems:   Elevated LFTs    Choledocholithiasis with acute cholecystitis Admit to med/surg bed. MRCP today. GI([place]) to see patient tomorrow.  Potential for ERCP tomorrow. May need lap chole during this hospital admission. Pt and husband aware of this possibility. Keep NPO. Gentle IVF. Prn IV dilaudid for pain. IV zosyn to cover for acute cholecystitis and potential cholangitis. Repeat labs in AM along with lipase.  Elevated LFTs Likely due to obstructive biliary process. Acute hepatitis panel is negative.  DVT prophylaxis: SCDs Code Status: Full Code Family Communication: discussed with pt and husband at bedside  Disposition Plan: return home  Consults called: GI([place]) consulted by EDP  Admission status: Inpatient, Med-Surg   Kristopher Oppenheim, DO [facility] 03/10/2021, 11:36 PM

## 2021-03-10 NOTE — Telephone Encounter (Signed)
Lynn Simpson from [facility] was trying to reach Dr. Rush Landmark as he is the doc on call.  This patient is a patient of Dr. Ardis Hughs; however, she presented to [facility] (the free-standing ER) with chronic vomiting.  AST 604, ALT 888, with cholecystitis and a possible obstruction in her CBD.  They are asking that Dr. Rush Landmark call the PA, Ms. Alroy Bailiff, at [phone].  Thank you.

## 2021-03-10 NOTE — Assessment & Plan Note (Addendum)
MRCP 12/22 with cholelithiasis without cholecystitis and choledocholithiasis with 3 small calculi up to 3 mm in diameter within distal common duct - mild superimposed extrahepatic biliary ductal dilatation S/p incomplete ERCP on 12/23 - plastic pancreatic stent placed into ventral pancreatic duct - unsuccessful at cannulation S/p lap cholecystectomy with IOC on 12/24  Intraoperative cholangiogram with distal CBD obstructing stone and biliary dilatation IR unsuccessful placement of internal/external biliary drainage catheter due to lack of intrahepatic biliary duct dilatation and patient body habitus -> intraprocedural cholangiogram demonstrates resorted patency of CBD (12/26) Abx per GI GI rec outpatient MRCP in 1-2 weeks  LFT's downtrending  She has worsening pain after IR procedure 12/26, discussed with IR who noted could be normal post procedural pain  Would have low threshold for repeat imaging given worsening pain

## 2021-03-10 NOTE — ED Notes (Signed)
Pt transported to US

## 2021-03-10 NOTE — Progress Notes (Signed)
Pharmacy Antibiotic Note  RUMAYSA SABATINO is a 50 y.o. female admitted on 03/10/2021 with  intra-abdominal infection .  Pharmacy has been consulted for Zosyn dosing.  Plan: Zosyn 3.375g IV q8h (4 hour infusion). No dose adjustments anticipated.  Pharmacy will sign off and monitor peripherally via electronic surveillance software for any changes in renal function or micro data.   Height: 5\' 2"  (157.5 cm) Weight: 115.7 kg (255 lb) IBW/kg (Calculated) : 50.1  Temp (24hrs), Avg:98.2 F (36.8 C), Min:98 F (36.7 C), Max:98.4 F (36.9 C)  Recent Labs  Lab 03/10/21 1021  WBC 6.2  CREATININE 0.52    Estimated Creatinine Clearance: 103.6 mL/min (by C-G formula based on SCr of 0.52 mg/dL).    Allergies  Allergen Reactions   Bee Venom Anaphylaxis   Wellbutrin [Bupropion] Anaphylaxis    Thank you for allowing pharmacy to be a part of this patients care.  Netta Cedars PharmD 03/10/2021 11:37 PM

## 2021-03-10 NOTE — ED Provider Notes (Signed)
[facility] EMERGENCY DEPARTMENT Provider Note   CSN: [id number] Arrival date & time: 03/10/21  7062     History Chief Complaint  Patient presents with   Emesis    Lynn Simpson is a 50 y.o. female with PMHx anxiety, depression, asthma, preventative bilateral mastectomies 03/2016, Lynch syndrome, GERD,  diarrhea predominant IBS  and colon polyps who presents to the ED today with complaint of dark colored urine and light colored stool for the past 2 days. Pt reports hx of recurrent abdominal pain, nausea, vomiting, and diarrhea that she follows GI for.She has EGD and colonoscopy scheduled for January of this year. She reports she has zofran and phenergan at home for her nausea which she has been taking however she has never had these issues with her stool/urine. She called her GI doctor today and states she was advised to come to the ED for further evaluation.   Per chart review pt was seen at [facility] yesterday for URI like symptoms after her child tested positive for COVID this past weekend. She had rapid COVID and flu testing which was all negative.   PSHx includes hysterectomy   The history is provided by the patient and medical records.      Past Medical History:  Diagnosis Date   Allergy    Anxiety    Asthma    Back pain    Cancer ([place]) 2018   Left Breast   COVID-19 virus infection 11/2019   Depression    Dyspnea    Family history of breast cancer    Family history of melanoma    Family history of ovarian cancer    Gallstones    GERD (gastroesophageal reflux disease)    IBS (irritable bowel syndrome)    Lower extremity edema    Lynch syndrome    Lynch syndrome    Migraine    Obesity    Ovarian cyst    Vitamin D deficiency     Patient Active Problem List   Diagnosis Date Noted   Choledocholithiasis with acute cholecystitis 03/10/2021   Other fatigue 08/21/2018   Shortness of breath on exertion 08/21/2018   At risk for heart disease 08/21/2018   Class 3  severe obesity with serious comorbidity and body mass index (BMI) of 45.0 to 49.9 in adult [facility]) 08/21/2018   Depression 08/21/2018   Gastroesophageal reflux disease 04/26/2018   S/P mastectomy, bilateral 04/06/2018   S/P breast reconstruction, bilateral 04/06/2018   Postoperative fever 09/16/2016   Breast cancer ([place]) 03/31/2016   Genetic testing 02/04/2016   Lynch syndrome 02/04/2016   Atypical lobular hyperplasia [facility]) of left breast 01/19/2016   Family history of breast cancer    Family history of ovarian cancer    Family history of melanoma    Internal and external bleeding hemorrhoids 06/24/2013    Past Surgical History:  Procedure Laterality Date   ANKLE FRACTURE SURGERY Right 12/2018   BREAST RECONSTRUCTION WITH PLACEMENT OF TISSUE EXPANDER AND FLEX HD (ACELLULAR HYDRATED DERMIS) Bilateral 03/31/2016   Procedure: BILATERAL BREAST RECONSTRUCTION WITH PLACEMENT OF TISSUE EXPANDER AND FLEX HD (ACELLULAR HYDRATED DERMIS);  Surgeon: Wallace Going, DO;  Location: [facility];  Service: Plastics;  Laterality: Bilateral;   CESAREAN SECTION  2005   COLONOSCOPY     LAPAROSCOPIC VAGINAL HYSTERECTOMY WITH SALPINGO OOPHORECTOMY Bilateral 09/15/2016   Procedure: LAPAROSCOPIC ASSISTED VAGINAL HYSTERECTOMY WITH BILATERAL SALPINGO OOPHORECTOMY;  Surgeon: Molli Posey, MD;  Location: [facility];  Service: Gynecology;  Laterality: Bilateral;  LIPOSUCTION Bilateral 06/30/2016   Procedure: LIPOSUCTION TO LATERAL CHEST;  Surgeon: Wallace Going, DO;  Location: [facility];  Service: Plastics;  Laterality: Bilateral;   NIPPLE SPARING MASTECTOMY Bilateral 03/31/2016   Procedure: BILATERAL NIPPLE SPARING MASTECTOMIES;  Surgeon: Autumn Messing III, MD;  Location: [facility];  Service: General;  Laterality: Bilateral;   POLYPECTOMY     REMOVAL OF BILATERAL TISSUE EXPANDERS WITH PLACEMENT OF BILATERAL BREAST IMPLANTS Bilateral 06/30/2016   Procedure: REMOVAL OF  BILATERAL TISSUE EXPANDERS WITH PLACEMENT OF BILATERAL SILCONE BREAST IMPLANTS;  Surgeon: Wallace Going, DO;  Location: [facility];  Service: Plastics;  Laterality: Bilateral;   WISDOM TOOTH EXTRACTION       OB History     Gravida  3   Para  3   Term  3   Preterm      AB      Living  3      SAB      IAB      Ectopic      Multiple      Live Births  3           Family History  Problem Relation Age of Onset   Breast cancer Mother 35   Liver cancer Mother    Alcoholism Mother    Lung cancer Father 26   Breast cancer Sister 38   Breast cancer Maternal Grandmother    Ovarian cancer Maternal Grandmother    Colon cancer Maternal Grandmother    Cancer Paternal Grandfather        NOS   Melanoma Maternal Aunt        mother's maternal half sister   Colon polyps Neg Hx    Rectal cancer Neg Hx    Stomach cancer Neg Hx    Esophageal cancer Neg Hx     Social History   Tobacco Use   Smoking status: Every Day    Packs/day: 0.50    Years: 20.00    Pack years: 10.00    Types: Cigarettes    Last attempt to quit: 12/20/2015    Years since quitting: 5.2   Smokeless tobacco: Never   Tobacco comments:    form given [id number]  Vaping Use   Vaping Use: Never used  Substance Use Topics   Alcohol use: Yes    Comment: occ   Drug use: No    Home Medications Prior to Admission medications   Medication Sig Start Date End Date Taking? Authorizing Provider  celecoxib (CELEBREX) 200 MG capsule Take 200 mg by mouth as needed. PT TAKING AS NEEDED 12/05/19   [provider]  famotidine (PEPCID) 40 MG tablet Take 1 tablet (40 mg total) by mouth daily. 02/03/21   Noralyn Pick, NP  hyoscyamine (LEVSIN SL) 0.125 MG SL tablet Place 1 tablet (0.125 mg total) under the tongue every 6 (six) hours as needed. 12/31/20   Noralyn Pick, NP  ondansetron (ZOFRAN-ODT) 8 MG disintegrating tablet Dissolve one tablet on the tongue every 8  hours as needed for nausea and vomiting. 02/03/21   Noralyn Pick, NP  promethazine (PHENERGAN) 12.5 MG tablet Take 1 tablet (12.5 mg total) by mouth 2 (two) times daily as needed for nausea or vomiting. 02/03/21   Noralyn Pick, NP  Sod Picosulfate-Mag Ox-Cit [facility]) 10-3.5-12 MG-GM -GM/160ML SOLN Take as split dose prep following the doctor's instructions 02/10/21   Noralyn Pick, NP    Allergies  Bee venom and Wellbutrin [bupropion]  Review of Systems   Review of Systems  Constitutional:  Positive for chills and fatigue. Negative for fever.  Respiratory:  Positive for cough.   Gastrointestinal:  Positive for abdominal pain, diarrhea, nausea and vomiting.       + light colored stool  Genitourinary:        + dark colored urine  All other systems reviewed and are negative.  Physical Exam Updated Vital Signs BP (!) 149/89 (BP Location: Right Arm)    Pulse 100    Temp 98.4 F (36.9 C) (Oral)    Resp 16    Ht '5\' 2"'  (1.575 m)    Wt 115.7 kg    LMP 09/07/2016 Comment: IUD REMOVED 09/07/16   SpO2 95%    BMI 46.64 kg/m   Physical Exam Vitals and nursing note reviewed.  Constitutional:      Appearance: She is not ill-appearing or diaphoretic.  HENT:     Head: Normocephalic and atraumatic.     Mouth/Throat:     Mouth: Mucous membranes are dry.  Eyes:     Conjunctiva/sclera: Conjunctivae normal.  Cardiovascular:     Rate and Rhythm: Normal rate and regular rhythm.     Pulses: Normal pulses.  Pulmonary:     Effort: Pulmonary effort is normal.     Breath sounds: Normal breath sounds. No wheezing, rhonchi or rales.  Abdominal:     Palpations: Abdomen is soft.     Tenderness: There is abdominal tenderness. There is no right CVA tenderness, left CVA tenderness, guarding or rebound.     Comments: Soft, very mild diffuse abd TTP, +BS throughout, no r/g/r, neg murphy's, neg mcburney's, no CVA TTP  Musculoskeletal:     Cervical back: Neck supple.   Skin:    General: Skin is warm and dry.  Neurological:     Mental Status: She is alert.    ED Results / Procedures / Treatments   Labs (all labs ordered are listed, but only abnormal results are displayed) Labs Reviewed  CBC WITH DIFFERENTIAL/PLATELET - Abnormal; Notable for the following components:      Result Value   Hemoglobin 15.3 (*)    All other components within normal limits  COMPREHENSIVE METABOLIC PANEL - Abnormal; Notable for the following components:   AST 604 (*)    ALT 888 (*)    Alkaline Phosphatase 207 (*)    Total Bilirubin 2.8 (*)    All other components within normal limits  URINALYSIS, ROUTINE W REFLEX MICROSCOPIC - Abnormal; Notable for the following components:   Color, Urine AMBER (*)    Glucose, UA 100 (*)    Bilirubin Urine LARGE (*)    Protein, ur 30 (*)    All other components within normal limits  URINALYSIS, MICROSCOPIC (REFLEX) - Abnormal; Notable for the following components:   Bacteria, UA RARE (*)    All other components within normal limits  RESP PANEL BY RT-PCR (FLU A&B, COVID) ARPGX2  CULTURE, BLOOD (ROUTINE X 2)  CULTURE, BLOOD (ROUTINE X 2)  LIPASE, BLOOD  PROTIME-INR  APTT  HEPATITIS PANEL, ACUTE    EKG None  Radiology US Abdomen Limited RUQ (LIVER/GB)  Result Date: 03/10/2021 CLINICAL DATA:  Right upper quadrant pain EXAM: ULTRASOUND ABDOMEN LIMITED RIGHT UPPER QUADRANT COMPARISON:  Abdominal ultrasound 07/28/2017 FINDINGS: Gallbladder: A few dependent echogenic shadowing calculi measuring up to 1 cm in size. No significant wall thickening or pericholecystic fluid visualized. Positive sonographic Murphy's sign per the  technologist. Common bile duct: Diameter: 10 mm Liver: Coarse, increased echogenicity of the parenchyma with no focal mass identified. Portal vein is patent on color Doppler imaging with normal direction of blood flow towards the liver. Other: None. IMPRESSION: 1. Cholelithiasis with positive sonographic Murphy's  sign per the technologist, suggesting acute calculus cholecystitis. 2. Dilated common bile duct, correlate for evidence of obstruction and consider MRCP if indicated. 3. Evidence of hepatic steatosis. Electronically Signed   By: Ofilia Neas M.D.   On: 03/10/2021 12:11    Procedures Procedures   Medications Ordered in ED Medications  promethazine (PHENERGAN) 25 MG/ML injection (has no administration in time range)  0.9 %  sodium chloride infusion ( Intravenous New Bag/Given 03/10/21 1037)  piperacillin-tazobactam (ZOSYN) IVPB 3.375 g (has no administration in time range)  sodium chloride 0.9 % bolus 1,000 mL (0 mLs Intravenous Stopped 03/10/21 1139)  promethazine (PHENERGAN) 25 mg in sodium chloride 0.9 % 50 mL IVPB (0 mg Intravenous Stopped 03/10/21 1139)    ED Course  I have reviewed the triage vital signs and the nursing notes.  Pertinent labs & imaging results that were available during my care of the patient were reviewed by me and considered in my medical decision making (see chart for details).    MDM Rules/Calculators/A&P                          50 year old female who presents to the ED today with complaint of dark-colored urine and light-colored stool for the past 2 days.  History of multiple GI issues, followed by GI with intermittent nausea, vomiting, diarrhea, abdominal pain for the past 3 months with plans for EGD and colonoscopy in 1 month.  On arrival to the ED today patient is afebrile.  She is mildly tachycardic at 105.  Remainder vitals unremarkable.  She is resting comfortably in the bed, no vomiting at this time however does state that she feels nauseated.  She is noted to have dry mucous membranes on exam.  She is some very mild diffuse abdominal tenderness palpation on exam.  Patient states that her abdominal pain is not the main concern and she is mostly concerned about the changes in her urine and stool at this time.  She is also been experiencing URI-like  symptoms over the past weekend after recent positive COVID exposure with her daughter.  Plan for abdominal screening labs and COVID/flu testing at this time.  We will plan for fluids, antiemetics and reevaluation.  Given she is not having any worsening abdominal pain and normal alignment feel she requires CT scan at this time however if any of her labs are significantly abnormal compared to previous may consider this.   CBC without leukocytosis today.  Hemoglobin slightly elevated 15.3.  Platelets within normal limits. CMP with significantly elevated LFTs.  AST 604, ALT 888, alk phos 207, T bili 2.8.  Appears new since 10/13.  We will add on right upper quadrant ultrasound at this time as well as hepatitis panel and coags.  Hepatic Function Latest Ref Rng & Units 03/10/2021 12/31/2020 08/21/2018  Total Protein 6.5 - 8.1 g/dL 7.7 7.6 6.6  Albumin 3.5 - 5.0 g/dL 4.1 4.6 4.5  AST 15 - 41 U/L 604(H) 19 17  ALT 0 - 44 U/L 888(H) 17 24  Alk Phosphatase 38 - 126 U/L 207(H) 91 135(H)  Total Bilirubin 0.3 - 1.2 mg/dL 2.8(H) 0.3 0.2  Bilirubin, Direct 0.0 - 0.3 mg/dL - - -  Lipase is within normal limits at 26. Urinalysis positive for bilirubin.  No signs of infection. COVID and Flu testing negative.  Ultrasound: IMPRESSION:  1. Cholelithiasis with positive sonographic Murphy's sign per the  technologist, suggesting acute calculus cholecystitis.  2. Dilated common bile duct, correlate for evidence of obstruction  and consider MRCP if indicated.  3. Evidence of hepatic steatosis.   Discussed case with gastroenterologist Dr. Rush Landmark who already who recommends admission and plans for MRCP with contrast.  He is fine with antibiotics here to cover for potential acute cholecystitis.  Depending on MRCP results we will either plan for ERCP or have general surgery involved for cholecystectomy.   [facility] hospitalist Dr. Marylyn Ishihara who agrees to accept patient for admission.   This note was prepared using Dragon  voice recognition software and may include unintentional dictation errors due to the inherent limitations of voice recognition software.     Final Clinical Impression(s) / ED Diagnoses Final diagnoses:  Elevated LFTs  Choledocholithiasis with acute cholecystitis    Rx / DC Orders ED Discharge Orders     None        Eustaquio Maize, PA-C 03/10/21 1352    Lennice Sites, DO 03/10/21 1503

## 2021-03-10 NOTE — ED Notes (Signed)
Patient updated on plan of care

## 2021-03-10 NOTE — ED Notes (Addendum)
[facility] called, text page also sent

## 2021-03-10 NOTE — Subjective & Objective (Signed)
CC: RUQ pain, acholic stools °HPI: °50-year-old female with a prior history of Lynch syndrome, history of prophylactic bilateral mastectomy due to family history of breast cancer who presents to the ER today with a 2-day history of persistent nausea, vomiting.  Patient's had orange-colored urine for the last couple days.  She thought this was coming from some of the vitamins and over-the-counter products she was taking for an upper respiratory infection.  She also noted acholic stools on Monday which concerned her.  She has been in contact with [facility] as she sees Dr. Jacobs on a frequent basis.  She has had several months of episodic abdominal pain.  She was getting scheduled for a EGD and colonoscopy next month. ° °In the ER, she was noted to be afebrile but mildly tachycardic. ° °Labs showed a white count of 6.2.  Chemistry showed a AST of 600 ALT of 800 alk phos of 207 and a total bili of 2.8.  INR was normal at 1.0. ° °Acute hepatitis panel was negative.  Ultrasound of the abdomen demonstrated cholelithiasis with the largest measuring 1 cm.  There is a positive sonographic Murphy sign.  Common bile abdominal diameter was 10 mm. ° °Due to concern for acute cholecystitis with choledocholithiasis, patient transferred to [facility] hospital. ° °[facility] was consulted over the phone. ° °Patient scheduled for an MRCP. °

## 2021-03-10 NOTE — ED Triage Notes (Signed)
Pt states that for months now she has had GI issues states she is follow by Dr [facility]. States that she has vomiting almost daily at baseline but 2 days ago she was have increased episodes of vomiting and a URI. States that she has also noticed some discoloration of her urine 'looking orange'. Also states that her stool is white.

## 2021-03-11 ENCOUNTER — Inpatient Hospital Stay (HOSPITAL_COMMUNITY): Payer: BC Managed Care – PPO

## 2021-03-11 DIAGNOSIS — R7989 Other specified abnormal findings of blood chemistry: Secondary | ICD-10-CM

## 2021-03-11 DIAGNOSIS — K8042 Calculus of bile duct with acute cholecystitis without obstruction: Secondary | ICD-10-CM | POA: Diagnosis not present

## 2021-03-11 LAB — BASIC METABOLIC PANEL
Anion gap: 10 (ref 5–15)
BUN: 11 mg/dL (ref 6–20)
CO2: 24 mmol/L (ref 22–32)
Calcium: 8.7 mg/dL — ABNORMAL LOW (ref 8.9–10.3)
Chloride: 105 mmol/L (ref 98–111)
Creatinine, Ser: 0.67 mg/dL (ref 0.44–1.00)
GFR, Estimated: >60 mL/min (ref >60)
Glucose, Bld: 75 mg/dL (ref 70–99)
Potassium: 3.3 mmol/L — ABNORMAL LOW (ref 3.5–5.1)
Sodium: 139 mmol/L (ref 135–145)

## 2021-03-11 LAB — CBC WITH DIFFERENTIAL/PLATELET
Abs Immature Granulocytes: 0.03 10*3/uL (ref 0.00–0.07)
Basophils Absolute: 0.1 10*3/uL (ref 0.0–0.1)
Basophils Relative: 1 %
Eosinophils Absolute: 0.5 10*3/uL (ref 0.0–0.5)
Eosinophils Relative: 8 %
HCT: 42.8 % (ref 36.0–46.0)
Hemoglobin: 14.4 g/dL (ref 12.0–15.0)
Immature Granulocytes: 1 %
Lymphocytes Relative: 34 %
Lymphs Abs: 2.1 10*3/uL (ref 0.7–4.0)
MCH: 30.3 pg (ref 26.0–34.0)
MCHC: 33.6 g/dL (ref 30.0–36.0)
MCV: 89.9 fL (ref 80.0–100.0)
Monocytes Absolute: 0.4 10*3/uL (ref 0.1–1.0)
Monocytes Relative: 6 %
Neutro Abs: 3.2 10*3/uL (ref 1.7–7.7)
Neutrophils Relative %: 50 %
Platelets: 166 10*3/uL (ref 150–400)
RBC: 4.76 MIL/uL (ref 3.87–5.11)
RDW: 12.4 % (ref 11.5–15.5)
WBC: 6.3 10*3/uL (ref 4.0–10.5)
nRBC: 0 % (ref 0.0–0.2)

## 2021-03-11 LAB — HEPATIC FUNCTION PANEL
ALT: 650 U/L — ABNORMAL HIGH (ref 0–44)
AST: 353 U/L — ABNORMAL HIGH (ref 15–41)
Albumin: 4 g/dL (ref 3.5–5.0)
Alkaline Phosphatase: 188 U/L — ABNORMAL HIGH (ref 38–126)
Bilirubin, Direct: 1.7 mg/dL — ABNORMAL HIGH (ref 0.0–0.2)
Indirect Bilirubin: 1.7 mg/dL — ABNORMAL HIGH (ref 0.3–0.9)
Total Bilirubin: 3.4 mg/dL — ABNORMAL HIGH (ref 0.3–1.2)
Total Protein: 7.2 g/dL (ref 6.5–8.1)

## 2021-03-11 IMAGING — MR MR ABDOMEN WO/W CM MRCP
18 of 20 series · 45 of 48 positions shown · IV contrast (gadavist)
Comparison: Sonogram [DATE]

CLINICAL DATA: Choledocholithiasis, acute cholecystitis

EXAM:
MRI ABDOMEN WITHOUT AND WITH CONTRAST (INCLUDING MRCP)
TECHNIQUE: Multiplanar multisequence MR imaging of the abdomen was performed
both before and after the administration of intravenous contrast.
Heavily T2-weighted images of the biliary and pancreatic ducts were
obtained, and three-dimensional MRCP images were rendered by post
processing.
CONTRAST:  10mL GADAVIST GADOBUTROL 1 MMOL/ML IV SOLN

[Series 3: T2 fat-sat · axial · 6.0mm · 1.25mm/px · z∈[+17,+269]mm · 2 of 36 slices shown]
[im 1/36]
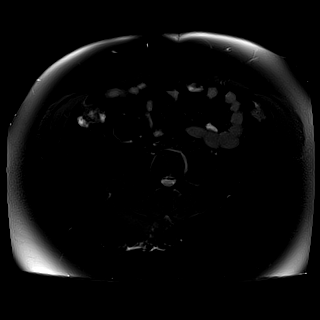
[im 36/36]
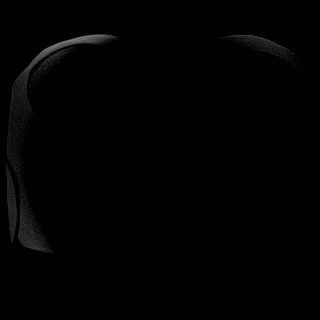

[Series 6: T2 · coronal · 6.5mm · 1.41mm/px · 1 of 30 slices shown (1 of 2)]
[im 1/30]
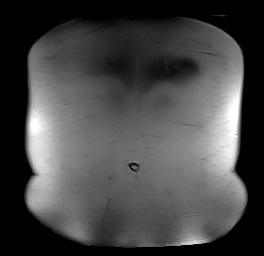

[Series 7: DWI · axial · 6.0mm · 2.99mm/px · z∈[+19,+271]mm · 3 of 72 slices shown (1 of 2)]
[im 1/72]
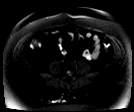
[im 36/72]
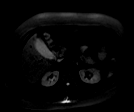
[im 72/72]
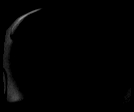

[Series 8: DWI · axial · 6.0mm · 2.99mm/px · 1 of 36 slices shown (2 of 2)]
[im 1/36]
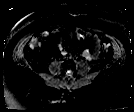

[Series 9: T1 · axial · 3.0mm · 1.25mm/px · z∈[+32,+269]mm · 3 of 80 slices shown (1 of 2)]
[im 1/80]
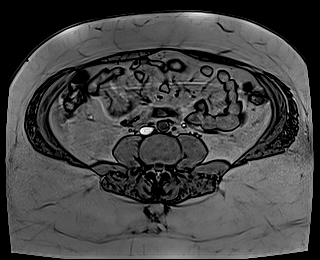
[im 40/80]
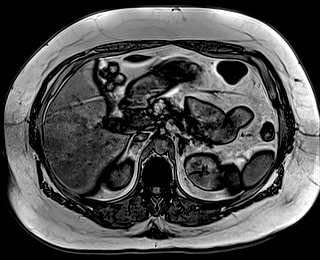
[im 80/80]
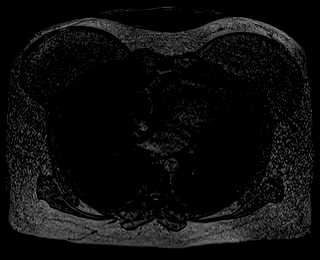

[Series 10: T1 · axial · 3.0mm · 1.25mm/px · z∈[+32,+269]mm · 3 of 80 slices shown (2 of 2)]
[im 1/80]
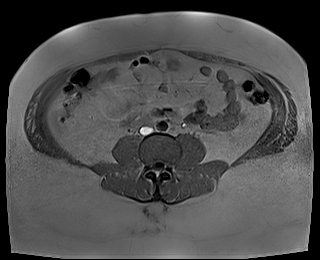
[im 40/80]
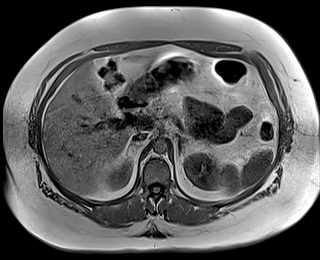
[im 80/80]
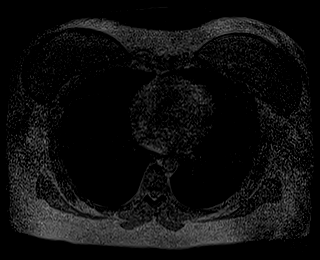

[Series 11: cor obl thk · sagittal · 50.0mm · 0.78mm/px · 1 of 9 slices shown]
[im 1/9]
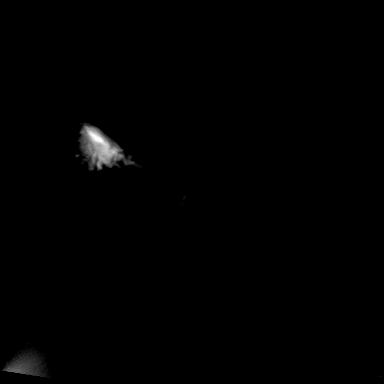

[Series 13: cor_3d_spc_trig · coronal · 1.2mm · 0.49mm/px · 3 of 80 slices shown]
[im 1/80]
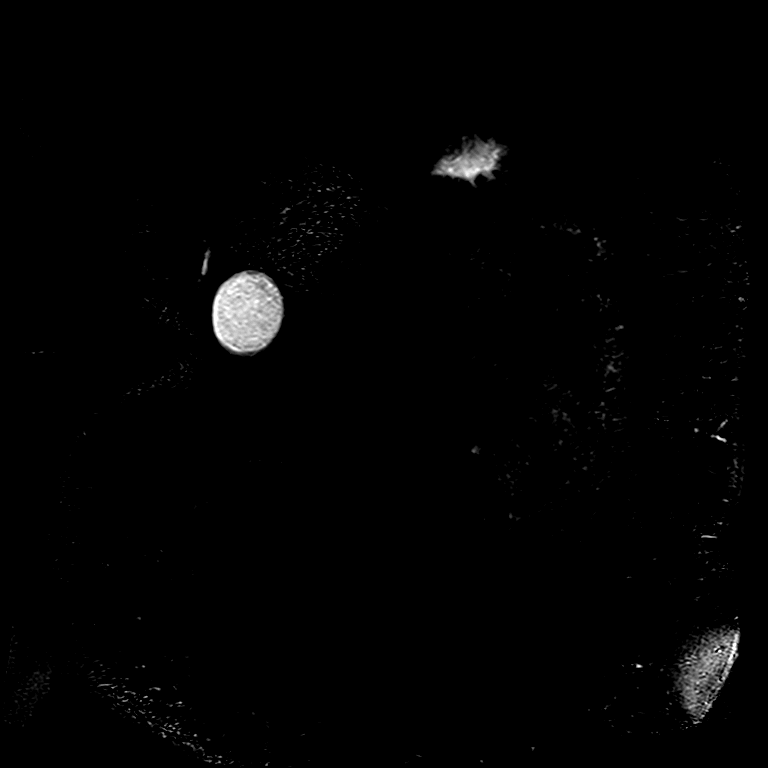
[im 40/80]
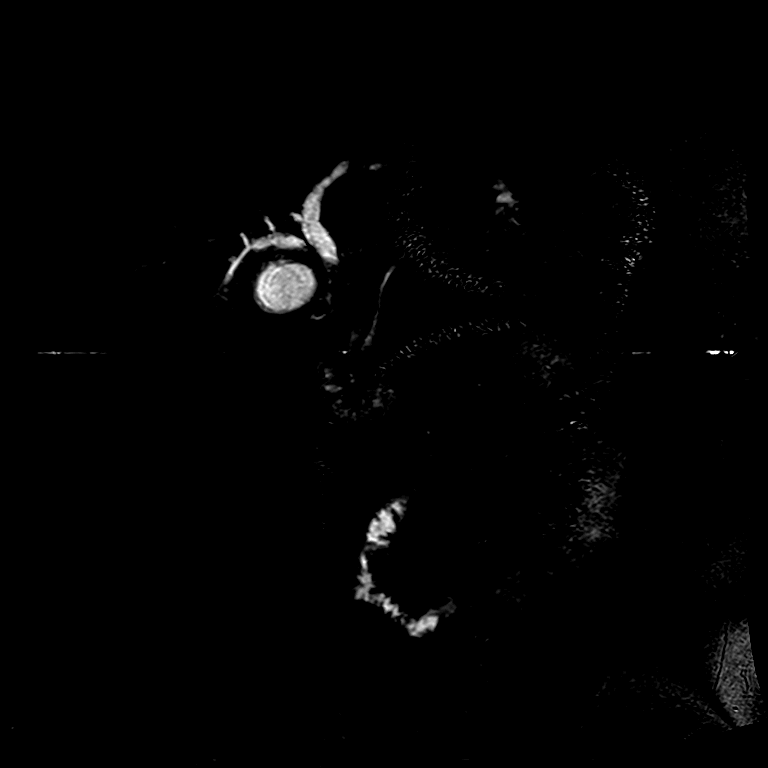
[im 80/80]
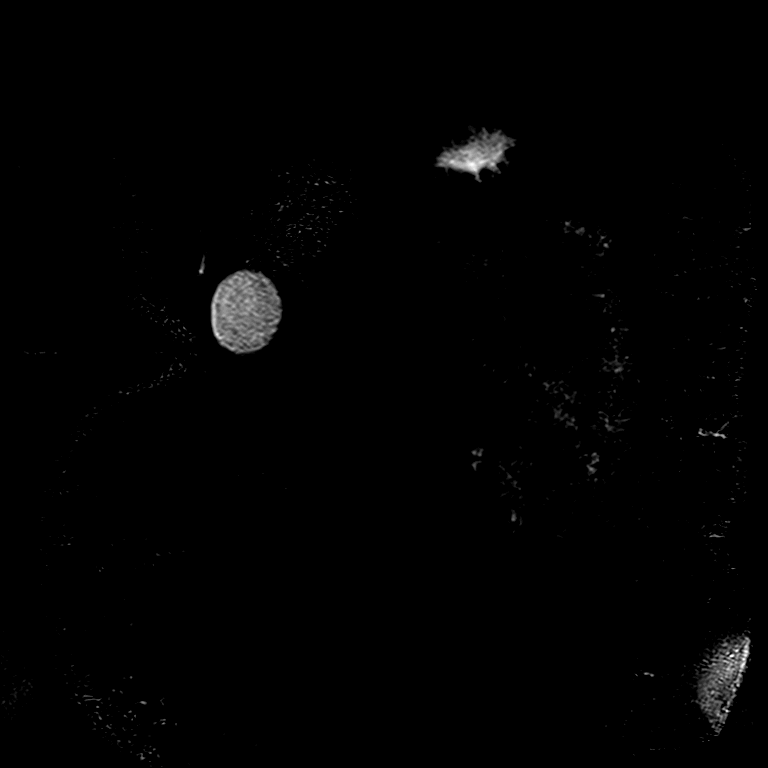

[Series 15: T2 · axial · 6.5mm · 1.56mm/px · 1 of 30 slices shown (2 of 2)]
[im 1/30]
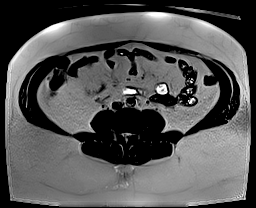

[Series 18: T1 dynamic · axial · 3.0mm · 1.56mm/px · z∈[+29,+266]mm · 3 of 80 slices shown (1 of 9)]
[im 1/80]
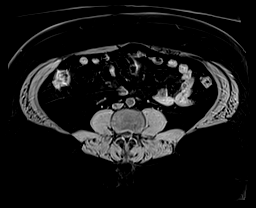
[im 40/80]
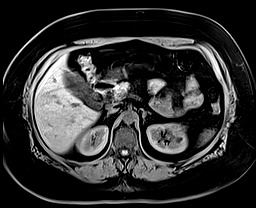
[im 80/80]
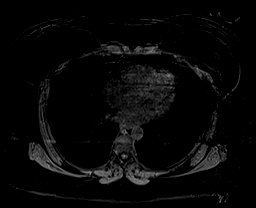

[Series 22: T1 dynamic · axial · 3.0mm · 1.56mm/px · z∈[+29,+266]mm · 3 of 80 slices shown (2 of 9)]
[im 1/80]
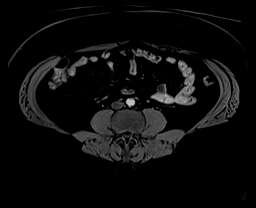
[im 40/80]
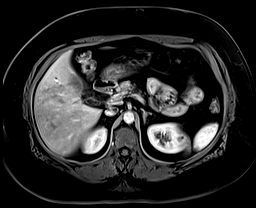
[im 80/80]
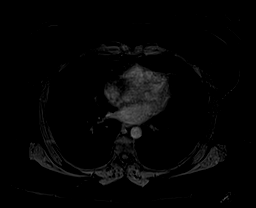

[Series 23: T1 dynamic · axial · 3.0mm · 1.56mm/px · z∈[+29,+266]mm · 3 of 80 slices shown (3 of 9)]
[im 1/80]
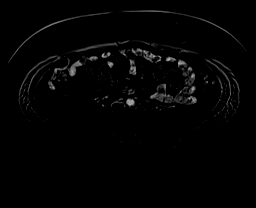
[im 40/80]
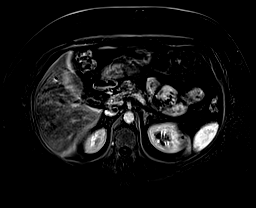
[im 80/80]
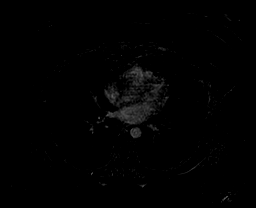

[Series 26: T1 dynamic · axial · 3.0mm · 1.56mm/px · z∈[+29,+266]mm · 3 of 80 slices shown (4 of 9)]
[im 1/80]
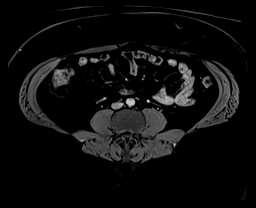
[im 40/80]
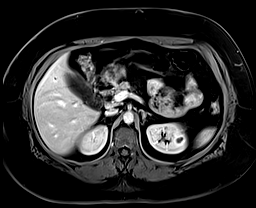
[im 80/80]
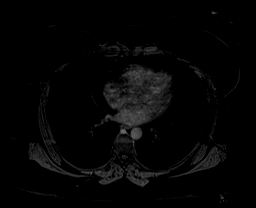

[Series 27: T1 dynamic · axial · 3.0mm · 1.56mm/px · z∈[+29,+266]mm · 3 of 80 slices shown (5 of 9)]
[im 1/80]
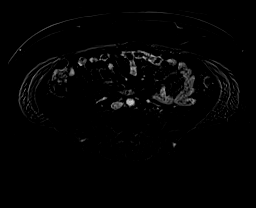
[im 40/80]
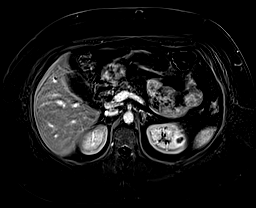
[im 80/80]
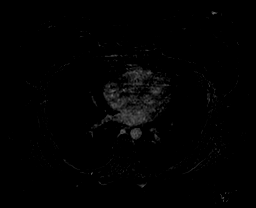

[Series 30: T1 dynamic · axial · 3.0mm · 1.56mm/px · z∈[+29,+266]mm · 3 of 80 slices shown (6 of 9)]
[im 1/80]
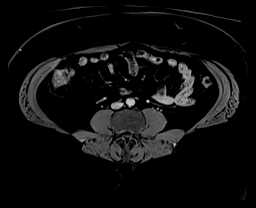
[im 40/80]
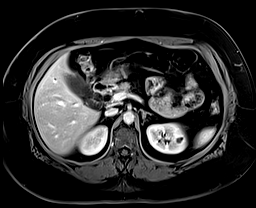
[im 80/80]
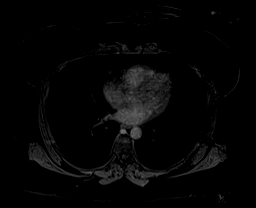

[Series 31: T1 dynamic · axial · 3.0mm · 1.56mm/px · z∈[+29,+266]mm · 3 of 80 slices shown (7 of 9)]
[im 1/80]
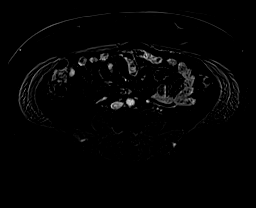
[im 40/80]
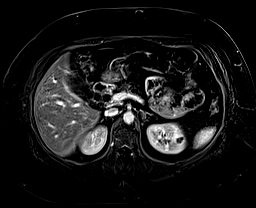
[im 80/80]
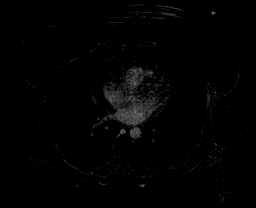

[Series 33: T1 dynamic · coronal · 3.0mm · 1.41mm/px · 3 of 72 slices shown (8 of 9)]
[im 1/72]
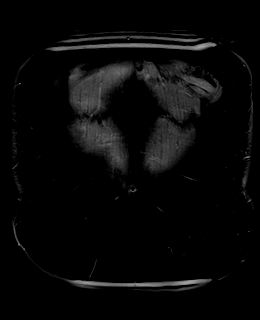
[im 36/72]
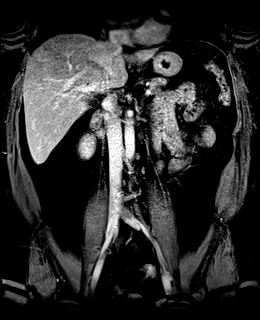
[im 72/72]
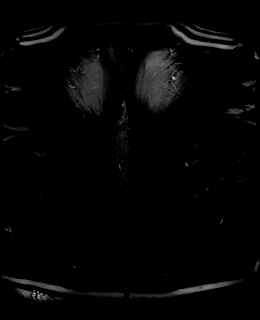

[Series 36: T1 dynamic · axial · 3.0mm · 1.56mm/px · z∈[+29,+266]mm · 3 of 80 slices shown (9 of 9)]
[im 1/80]
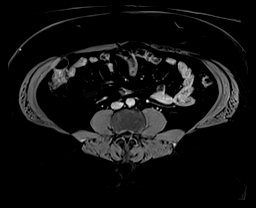
[im 40/80]
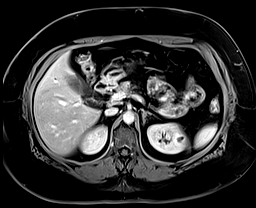
[im 80/80]
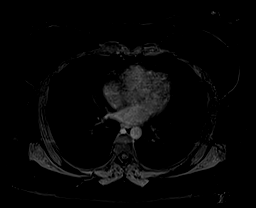

[45 of 48 positions shown; findings below may reference images not displayed]

FINDINGS: Lower chest: Bilateral breast implants are visualized. Cardiac size
within normal limits.

Hepatobiliary: Normal hepatic parenchymal signal intensity. No focal
intrahepatic masses are identified. No intrahepatic biliary ductal
dilation. The extrahepatic bile duct is dilated measuring 11 mm in
diameter. There are at least 3 intraluminal filling defects
identified within the distal common duct in keeping with small
stones measuring up to 3 mm in greatest dimension. Multiple layering
gallstones are seen within the gallbladder along with a small amount
of sludge. The gallbladder is mildly distended. No pericholecystic
inflammatory stranding or gallbladder hydrops is identified to
suggest superimposed acute cholecystitis.

Pancreas: No mass, inflammatory changes, or other parenchymal
abnormality identified. No divisum

Spleen:  Within normal limits in size and appearance.

Adrenals/Urinary Tract: The adrenal glands are unremarkable. The
kidneys are normal in size and position. No hydronephrosis. Simple
cortical cyst noted within the mid to upper pole the left kidney.

Stomach/Bowel: Visualized portions within the abdomen are
unremarkable.

Vascular/Lymphatic: No pathologically enlarged lymph nodes
identified. No abdominal aortic aneurysm demonstrated.

Other:  None.

Musculoskeletal: No suspicious bone lesions identified.
IMPRESSION: Cholelithiasis without superimposed inflammatory change to suggest
acute cholecystitis.

Choledocholithiasis with at least 3 small calculi measuring up to 3
mm in diameter within the distal common duct. Mild superimposed
extrahepatic biliary ductal dilation.

## 2021-03-11 MED ORDER — GADOBUTROL 1 MMOL/ML IV SOLN
10.0000 mL | Freq: Once | INTRAVENOUS | Status: AC | PRN
  Administered 2021-03-11: 01:00:00 10 mL via INTRAVENOUS

## 2021-03-11 MED ORDER — SODIUM CHLORIDE 0.9 % IV SOLN: INTRAVENOUS | Status: DC

## 2021-03-11 MED ORDER — SODIUM CHLORIDE 0.9 % IV SOLN: INTRAVENOUS | Status: DC | PRN

## 2021-03-11 NOTE — Hospital Course (Signed)
Lynn Simpson is a 50 y.o. female with a histoy of Lynch syndrome. She presented secondary to RUQ pain and acholic stools with evidence of cholecystitis and choledocholithiasis on imaging. Plan for ERCP followed by cholecystectomy.

## 2021-03-11 NOTE — Consult Note (Signed)
[facility] Surgery Consult Note  Lynn Simpson Sep 21, 1970  [id number].    Requesting MD: Cordelia Poche Chief Complaint/Reason for Consult: choledocholithiasis  HPI:  Lynn Simpson is a 50yo female PMH asthma, lynch syndrome, and obesity BMI 46.64 who was admitted to [facility] yesterday with choledocholithiasis. Patient states that she has had intermittent upper abdominal pain, nausea, and vomiting since September of this year. Episodes would typically start after PO intake. This episode began 3 days ago. States that the pain is epigastric/ RUQ. Associated with multiple episodes of nausea and vomiting. Denies fever or chills. States that she noticed her urine was orange and her stool white so she came to the ED. In the ED she underwent CT a/p and u/s which showed cholelithiasis, positive sonographic Murphy sign, and dilated common bile duct 54m. Labs upon admission WBC 6.2, LFTs elevated with AST 604, ALT 888, Alk phos 207, Tbili 2.8. Lipase normal 26. Hepatitis panel negative. MRPC was obtained today confirming choledocholithiasis, no signs of acute cholecystitis. Patient is scheduled for ERCP tomorrow with GI. General surgery asked to see to discuss laparoscopic cholecystectomy.  Abdominal surgical history: hysterectomy, c section Anticoagulants: none Smokes 6-10 cigarettes per day Drinks alcohol occasionally Denies illicit drug use  Review of Systems  Constitutional: Negative.   Respiratory: Negative.    Cardiovascular: Negative.   Gastrointestinal:  Positive for abdominal pain, diarrhea, nausea and vomiting.  Genitourinary: Negative.    All systems reviewed and otherwise negative except for as above  Family History  Problem Relation Age of Onset   Breast cancer Mother [phone]  Liver cancer Mother    Alcoholism Mother    Lung cancer Father [phone]  Breast cancer Sister [phone]  Breast cancer Maternal Grandmother    Ovarian cancer Maternal Grandmother    Colon cancer Maternal Grandmother     Cancer Paternal Grandfather        NOS   Melanoma Maternal Aunt        mother's maternal half sister   Colon polyps Neg Hx    Rectal cancer Neg Hx    Stomach cancer Neg Hx    Esophageal cancer Neg Hx     Past Medical History:  Diagnosis Date   Allergy    Anxiety    Asthma    Back pain    Cancer ([place] 2018   Left Breast   COVID-19 virus infection 11/2019   Depression    Dyspnea    Family history of breast cancer    Family history of melanoma    Family history of ovarian cancer    Gallstones    GERD (gastroesophageal reflux disease)    IBS (irritable bowel syndrome)    Lower extremity edema    Lynch syndrome    Lynch syndrome    Migraine    Obesity    Ovarian cyst    Vitamin D deficiency     Past Surgical History:  Procedure Laterality Date   ANKLE FRACTURE SURGERY Right 12/2018   BREAST RECONSTRUCTION WITH PLACEMENT OF TISSUE EXPANDER AND FLEX HD (ACELLULAR HYDRATED DERMIS) Bilateral 03/31/2016   Procedure: BILATERAL BREAST RECONSTRUCTION WITH PLACEMENT OF TISSUE EXPANDER AND FLEX HD (ACELLULAR HYDRATED DERMIS);  Surgeon: CWallace Going DO;  [place]  Service: Plastics;  Laterality: Bilateral;   CESAREAN SECTION  2005   COLONOSCOPY     LAPAROSCOPIC VAGINAL HYSTERECTOMY WITH SALPINGO OOPHORECTOMY Bilateral 09/15/2016   Procedure: LAPAROSCOPIC ASSISTED VAGINAL HYSTERECTOMY WITH BILATERAL SALPINGO OOPHORECTOMY;  Surgeon: Molli Posey, MD;  Location: [facility];  Service: Gynecology;  Laterality: Bilateral;   LIPOSUCTION Bilateral 06/30/2016   Procedure: LIPOSUCTION TO LATERAL CHEST;  Surgeon: Wallace Going, DO;  Location: [place];  Service: Plastics;  Laterality: Bilateral;   NIPPLE SPARING MASTECTOMY Bilateral 03/31/2016   Procedure: BILATERAL NIPPLE SPARING MASTECTOMIES;  Surgeon: Autumn Messing III, MD;  Location: [place];  Service: General;  Laterality: Bilateral;   POLYPECTOMY     REMOVAL OF  BILATERAL TISSUE EXPANDERS WITH PLACEMENT OF BILATERAL BREAST IMPLANTS Bilateral 06/30/2016   Procedure: REMOVAL OF BILATERAL TISSUE EXPANDERS WITH PLACEMENT OF BILATERAL SILCONE BREAST IMPLANTS;  Surgeon: Wallace Going, DO;  Location: [place];  Service: Plastics;  Laterality: Bilateral;   WISDOM TOOTH EXTRACTION      Social History:  reports that she has been smoking cigarettes. She has a 10.00 pack-year smoking history. She has never used smokeless tobacco. She reports current alcohol use. She reports that she does not use drugs.  Allergies:  Allergies  Allergen Reactions   Bee Venom Anaphylaxis   Wellbutrin [Bupropion] Anaphylaxis    Medications Prior to Admission  Medication Sig Dispense Refill   calcium carbonate (TUMS EX) 750 MG chewable tablet Chew 1 tablet by mouth daily as needed for heartburn.     celecoxib (CELEBREX) 200 MG capsule Take 200 mg by mouth daily as needed for moderate pain or mild pain.     Doxylamine-DM (VICKS DAYQUIL/NYQUIL COUGH PO) Take 30 mLs by mouth every 4 (four) hours as needed (cough).     famotidine (PEPCID) 40 MG tablet Take 1 tablet (40 mg total) by mouth daily. 90 tablet 1   hyoscyamine (LEVSIN SL) 0.125 MG SL tablet Place 1 tablet (0.125 mg total) under the tongue every 6 (six) hours as needed. 30 tablet 1   ondansetron (ZOFRAN-ODT) 8 MG disintegrating tablet Dissolve one tablet on the tongue every 8 hours as needed for nausea and vomiting. (Patient taking differently: Take 8 mg by mouth every 8 (eight) hours as needed for nausea or vomiting.) 30 tablet 1   promethazine (PHENERGAN) 12.5 MG tablet Take 1 tablet (12.5 mg total) by mouth 2 (two) times daily as needed for nausea or vomiting. 30 tablet 1   Sod Picosulfate-Mag Ox-Cit Acd (CLENPIQ) 10-3.5-12 MG-GM -GM/160ML SOLN Take as split dose prep following the doctor's instructions 320 mL 0   albuterol (VENTOLIN HFA) 108 (90 Base) MCG/ACT inhaler Inhale 2 puffs into the lungs every  4 (four) hours as needed for shortness of breath. (Patient not taking: Reported on 03/11/2021)     hydrOXYzine (ATARAX) 10 MG tablet Take 10 mg by mouth. (Patient not taking: Reported on 03/11/2021)      Prior to Admission medications   Medication Sig Start Date End Date Taking? Authorizing Provider  calcium carbonate (TUMS EX) 750 MG chewable tablet Chew 1 tablet by mouth daily as needed for heartburn.   Yes [provider]  celecoxib (CELEBREX) 200 MG capsule Take 200 mg by mouth daily as needed for moderate pain or mild pain. 12/05/19  Yes [provider]  Doxylamine-DM (VICKS DAYQUIL/NYQUIL COUGH PO) Take 30 mLs by mouth every 4 (four) hours as needed (cough).   Yes [provider]  famotidine (PEPCID) 40 MG tablet Take 1 tablet (40 mg total) by mouth daily. 02/03/21  Yes Noralyn Pick, NP  hyoscyamine (LEVSIN SL) 0.125 MG SL tablet Place 1 tablet (0.125 mg total) under the tongue every  6 (six) hours as needed. 12/31/20  Yes Noralyn Pick, NP  ondansetron (ZOFRAN-ODT) 8 MG disintegrating tablet Dissolve one tablet on the tongue every 8 hours as needed for nausea and vomiting. Patient taking differently: Take 8 mg by mouth every 8 (eight) hours as needed for nausea or vomiting. 02/03/21  Yes Noralyn Pick, NP  promethazine (PHENERGAN) 12.5 MG tablet Take 1 tablet (12.5 mg total) by mouth 2 (two) times daily as needed for nausea or vomiting. 02/03/21  Yes Noralyn Pick, NP  Sod Picosulfate-Mag Ox-Cit Acd [facility]) 10-3.5-12 MG-GM -GM/160ML SOLN Take as split dose prep following the doctor's instructions 02/10/21  Yes Noralyn Pick, NP  albuterol (VENTOLIN HFA) 108 (90 Base) MCG/ACT inhaler Inhale 2 puffs into the lungs every 4 (four) hours as needed for shortness of breath. Patient not taking: Reported on 03/11/2021 03/09/21   [provider]  hydrOXYzine (ATARAX) 10 MG tablet Take 10 mg by mouth. Patient  not taking: Reported on 03/11/2021 03/09/21   [provider]    Blood pressure (!) 141/95, pulse 88, temperature 98.1 F (36.7 C), temperature source Oral, resp. rate 18, height _0  (1.575 m), weight 115.7 kg, last menstrual period 09/07/2016, SpO2 96 %. Physical Exam: General: pleasant, WD/WN female who is laying in bed in NAD HEENT: head is normocephalic, atraumatic.  Sclera are noninjected.  Pupils equal and round.  Ears and nose without any masses or lesions.  Mouth is pink and moist. Dentition fair Heart: regular, rate, and rhythm.  Normal s1,s2. No obvious murmurs, gallops, or rubs noted.  Palpable pedal pulses bilaterally  Lungs: CTAB, no wheezes, rhonchi, or rales noted.  Respiratory effort nonlabored Abd: soft, ND, +BS, no masses, hernias, or organomegaly. Mild epigastric and RUQ TTP without rebound or guarding MS: no BUE/BLE edema, calves soft and nontender Skin: warm and dry with no masses, lesions, or rashes Psych: A&Ox4 with an appropriate affect Neuro: cranial nerves grossly intact, equal strength in BUE/BLE bilaterally, normal speech, thought process intact  Results for orders placed or performed during the hospital encounter of 03/10/21 (from the past 48 hour(s))  Urinalysis, Routine w reflex microscopic Urine, Clean Catch     Status: Abnormal   Collection Time: 03/10/21 10:06 AM  Result Value Ref Range   Color, Urine AMBER (A) YELLOW    Comment: BIOCHEMICALS MAY BE AFFECTED BY COLOR   APPearance CLEAR CLEAR   Specific Gravity, Urine >=1.030 1.005 - 1.030   pH 6.0 5.0 - 8.0   Glucose, UA 100 (A) NEGATIVE mg/dL   Hgb urine dipstick NEGATIVE NEGATIVE   Bilirubin Urine LARGE (A) NEGATIVE   Ketones, ur NEGATIVE NEGATIVE mg/dL   Protein, ur 30 (A) NEGATIVE mg/dL   Nitrite NEGATIVE NEGATIVE   Leukocytes,Ua NEGATIVE NEGATIVE    Comment: Performed at [facility], [address] [place], [place] [address]  Resp Panel by RT-PCR (Flu A&B, Covid)  Nasopharyngeal Swab     Status: None   Collection Time: 03/10/21 10:06 AM   Specimen: Nasopharyngeal Swab; Nasopharyngeal(NP) swabs in vial transport medium  Result Value Ref Range   SARS Coronavirus 2 by RT PCR NEGATIVE NEGATIVE    Comment: (NOTE) SARS-CoV-2 target nucleic acids are NOT DETECTED.  The SARS-CoV-2 RNA is generally detectable in upper respiratory specimens during the acute phase of infection. The lowest concentration of SARS-CoV-2 viral copies this assay can detect is 138 copies/mL. A negative result does not preclude SARS-Cov-2 infection and should not be used  as the sole basis for treatment or other patient management decisions. A negative result may occur with  improper specimen collection/handling, submission of specimen other than nasopharyngeal swab, presence of viral mutation(s) within the areas targeted by this assay, and inadequate number of viral copies(<138 copies/mL). A negative result must be combined with clinical observations, patient history, and epidemiological information. The expected result is Negative.  Fact Sheet for Patients:  EntrepreneurPulse.com.au  Fact Sheet for Healthcare Providers:  IncredibleEmployment.be  This test is no t yet approved or cleared by the [place] FDA and  has been authorized for detection and/or diagnosis of SARS-CoV-2 by FDA under an Emergency Use Authorization (EUA). This EUA will remain  in effect (meaning this test can be used) for the duration of the COVID-19 declaration under Section 564(b)(1) of the Act, 21 U.S.C.section 360bbb-3(b)(1), unless the authorization is terminated  or revoked sooner.       Influenza A by PCR NEGATIVE NEGATIVE   Influenza B by PCR NEGATIVE NEGATIVE    Comment: (NOTE) The Xpert Xpress SARS-CoV-2/FLU/RSV plus assay is intended as an aid in the diagnosis of influenza from Nasopharyngeal swab specimens and should not be used as a sole basis for  treatment. Nasal washings and aspirates are unacceptable for Xpert Xpress SARS-CoV-2/FLU/RSV testing.  Fact Sheet for Patients: EntrepreneurPulse.com.au  Fact Sheet for Healthcare Providers: IncredibleEmployment.be  This test is not yet approved or cleared by the [place] FDA and has been authorized for detection and/or diagnosis of SARS-CoV-2 by FDA under an Emergency Use Authorization (EUA). This EUA will remain in effect (meaning this test can be used) for the duration of the COVID-19 declaration under Section 564(b)(1) of the Act, 21 U.S.C. section 360bbb-3(b)(1), unless the authorization is terminated or revoked.  Performed at [facility], [address] [place], [place] [address]   Urinalysis, Microscopic (reflex)     Status: Abnormal   Collection Time: 03/10/21 10:06 AM  Result Value Ref Range   RBC / HPF 0-5 0 - 5 RBC/hpf   WBC, UA 0-5 0 - 5 WBC/hpf   Bacteria, UA RARE (A) NONE SEEN   Squamous Epithelial / LPF 0-5 0 - 5   Mucus PRESENT     Comment: Performed at [facility], [address] [place], [place] [address]  CBC with Differential     Status: Abnormal   Collection Time: 03/10/21 10:21 AM  Result Value Ref Range   WBC 6.2 4.0 - 10.5 K/uL   RBC 5.05 3.87 - 5.11 MIL/uL   Hemoglobin 15.3 (H) 12.0 - 15.0 g/dL   HCT 45.3 36.0 - 46.0 %   MCV 89.7 80.0 - 100.0 fL   MCH 30.3 26.0 - 34.0 pg   MCHC 33.8 30.0 - 36.0 g/dL   RDW 12.3 11.5 - 15.5 %   Platelets 180 150 - 400 K/uL   nRBC 0.0 0.0 - 0.2 %   Neutrophils Relative % 58 %   Neutro Abs 3.6 1.7 - 7.7 K/uL   Lymphocytes Relative 30 %   Lymphs Abs 1.9 0.7 - 4.0 K/uL   Monocytes Relative 4 %   Monocytes Absolute 0.3 0.1 - 1.0 K/uL   Eosinophils Relative 7 %   Eosinophils Absolute 0.4 0.0 - 0.5 K/uL   Basophils Relative 1 %   Basophils Absolute 0.0 0.0 - 0.1 K/uL   Immature Granulocytes 0 %   Abs Immature Granulocytes 0.02 0.00 - 0.07 K/uL     Comment: Performed  at [facility], [address] [place], [place] [address]  Comprehensive metabolic panel     Status: Abnormal   Collection Time: 03/10/21 10:21 AM  Result Value Ref Range   Sodium 137 135 - 145 mmol/L   Potassium 3.6 3.5 - 5.1 mmol/L   Chloride 101 98 - 111 mmol/L   CO2 27 22 - 32 mmol/L   Glucose, Bld 99 70 - 99 mg/dL    Comment: Glucose reference range applies only to samples taken after fasting for at least 8 hours.   BUN 12 6 - 20 mg/dL   Creatinine, Ser 0.52 0.44 - 1.00 mg/dL   Calcium 8.9 8.9 - 10.3 mg/dL   Total Protein 7.7 6.5 - 8.1 g/dL   Albumin 4.1 3.5 - 5.0 g/dL   AST 604 (H) 15 - 41 U/L   ALT 888 (H) 0 - 44 U/L   Alkaline Phosphatase 207 (H) 38 - 126 U/L   Total Bilirubin 2.8 (H) 0.3 - 1.2 mg/dL   GFR, Estimated >60 >60 mL/min    Comment: (NOTE) Calculated using the CKD-EPI Creatinine Equation (2021)    Anion gap 9 5 - 15    Comment: Performed at [facility], [address] [place], [place] [address]  Lipase, blood     Status: None   Collection Time: 03/10/21 10:21 AM  Result Value Ref Range   Lipase 26 11 - 51 U/L    Comment: Performed at [facility], [address] [place], [place] [address]  Hepatitis panel, acute     Status: None   Collection Time: 03/10/21 12:11 PM  Result Value Ref Range   Hepatitis B Surface Ag NON REACTIVE NON REACTIVE   HCV Ab NON REACTIVE NON REACTIVE    Comment: (NOTE) Nonreactive HCV antibody screen is consistent with no HCV infections,  unless recent infection is suspected or other evidence exists to indicate HCV infection.     Hep A IgM NON REACTIVE NON REACTIVE   Hep B C IgM NON REACTIVE NON REACTIVE    Comment: Performed at [facility], [place] [address] [place], [place] [address]  Protime-INR     Status: None   Collection Time: 03/10/21 12:11 PM  Result Value Ref Range   Prothrombin Time 13.2 11.4 - 15.2 seconds   INR 1.0 0.8 - 1.2    Comment: (NOTE) INR goal  varies based on device and disease states. Performed at [facility], [address] [place], [place] [address]   APTT     Status: None   Collection Time: 03/10/21 12:11 PM  Result Value Ref Range   aPTT 27 24 - 36 seconds    Comment: Performed at [facility], [address] [place], [place] [address]  Culture, blood (routine x 2)     Status: None (Preliminary result)   Collection Time: 03/10/21  1:49 PM   Specimen: BLOOD  Result Value Ref Range   Specimen Description      BLOOD Blood Culture adequate volume Performed at [facility], [address] [place], [place] [address]    Special Requests      AEROBIC BOTTLE ONLY LEFT ANTECUBITAL Performed at [facility], [address] [place], [place] [address]    Culture      NO GROWTH < 24 HOURS Performed at [facility], [address] [place],  [place] [address]    Report Status PENDING   Culture, blood (routine x 2)     Status: None (Preliminary result)   Collection Time: 03/10/21  2:00 PM   Specimen: BLOOD  Result Value Ref Range   Specimen Description      BLOOD Blood Culture adequate volume Performed at [facility], [address] [place], [place] [address]    Special Requests      BOTTLES DRAWN AEROBIC AND ANAEROBIC BLOOD LEFT HAND Performed at [facility], [address] [place], [place] [address]    Culture      NO GROWTH < 24 HOURS Performed at [facility], [place] [address], [place], [place] [address]    Report Status PENDING   CBC with Differential     Status: None   Collection Time: 03/11/21  5:54 AM  Result Value Ref Range   WBC 6.3 4.0 - 10.5 K/uL   RBC 4.76 3.87 - 5.11 MIL/uL   Hemoglobin 14.4 12.0 - 15.0 g/dL   HCT 42.8 36.0 - 46.0 %   MCV 89.9 80.0 - 100.0 fL   MCH 30.3 26.0 - 34.0 pg   MCHC 33.6 30.0 - 36.0 g/dL   RDW 12.4 11.5 - 15.5 %   Platelets 166 150 - 400 K/uL   nRBC 0.0 0.0 - 0.2 %   Neutrophils Relative %  50 %   Neutro Abs 3.2 1.7 - 7.7 K/uL   Lymphocytes Relative 34 %   Lymphs Abs 2.1 0.7 - 4.0 K/uL   Monocytes Relative 6 %   Monocytes Absolute 0.4 0.1 - 1.0 K/uL   Eosinophils Relative 8 %   Eosinophils Absolute 0.5 0.0 - 0.5 K/uL   Basophils Relative 1 %   Basophils Absolute 0.1 0.0 - 0.1 K/uL   Immature Granulocytes 1 %   Abs Immature Granulocytes 0.03 0.00 - 0.07 K/uL    Comment: Performed at [facility], [place] [address] [place], [place] [address]  Basic metabolic panel Once     Status: Abnormal   Collection Time: 03/11/21  5:54 AM  Result Value Ref Range   Sodium 139 135 - 145 mmol/L   Potassium 3.3 (L) 3.5 - 5.1 mmol/L   Chloride 105 98 - 111 mmol/L   CO2 24 22 - 32 mmol/L   Glucose, Bld 75 70 - 99 mg/dL    Comment: Glucose reference range applies only to samples taken after fasting for at least 8 hours.   BUN 11 6 - 20 mg/dL   Creatinine, Ser 0.67 0.44 - 1.00 mg/dL   Calcium 8.7 (L) 8.9 - 10.3 mg/dL   GFR, Estimated >60 >60 mL/min    Comment: (NOTE) Calculated using the CKD-EPI Creatinine Equation (2021)    Anion gap 10 5 - 15    Comment: Performed at [facility], [place] [address], [place], [place] [address]  Hepatic function panel Once     Status: Abnormal   Collection Time: 03/11/21  5:54 AM  Result Value Ref Range   Total Protein 7.2 6.5 - 8.1 g/dL   Albumin 4.0 3.5 - 5.0 g/dL   AST 353 (H) 15 - 41 U/L   ALT 650 (H) 0 - 44 U/L   Alkaline Phosphatase 188 (H) 38 - 126 U/L   Total Bilirubin 3.4 (H) 0.3 - 1.2 mg/dL   Bilirubin, Direct 1.7 (H) 0.0 - 0.2 mg/dL   Indirect Bilirubin 1.7 (H) 0.3 -  0.9 mg/dL    Comment: Performed at [facility], [place] [address], [place], [place] [address]   MR 3D Recon At Scanner  Result Date: 03/11/2021 CLINICAL DATA:  Choledocholithiasis, acute cholecystitis EXAM: MRI ABDOMEN WITHOUT AND WITH CONTRAST (INCLUDING MRCP) TECHNIQUE: Multiplanar multisequence MR imaging of the abdomen  was performed both before and after the administration of intravenous contrast. Heavily T2-weighted images of the biliary and pancreatic ducts were obtained, and three-dimensional MRCP images were rendered by post processing. CONTRAST:  42m GADAVIST GADOBUTROL 1 MMOL/ML IV SOLN COMPARISON:  Sonogram 03/10/2021 FINDINGS: Lower chest: Bilateral breast implants are visualized. Cardiac size within normal limits. Hepatobiliary: Normal hepatic parenchymal signal intensity. No focal intrahepatic masses are identified. No intrahepatic biliary ductal dilation. The extrahepatic bile duct is dilated measuring 11 mm in diameter. There are at least 3 intraluminal filling defects identified within the distal common duct in keeping with small stones measuring up to 3 mm in greatest dimension. Multiple layering gallstones are seen within the gallbladder along with a small amount of sludge. The gallbladder is mildly distended. No pericholecystic inflammatory stranding or gallbladder hydrops is identified to suggest superimposed acute cholecystitis. Pancreas: No mass, inflammatory changes, or other parenchymal abnormality identified. No divisum Spleen:  Within normal limits in size and appearance. Adrenals/Urinary Tract: The adrenal glands are unremarkable. The kidneys are normal in size and position. No hydronephrosis. Simple cortical cyst noted within the mid to upper pole the left kidney. Stomach/Bowel: Visualized portions within the abdomen are unremarkable. Vascular/Lymphatic: No pathologically enlarged lymph nodes identified. No abdominal aortic aneurysm demonstrated. Other:  None. Musculoskeletal: No suspicious bone lesions identified. IMPRESSION: Cholelithiasis without superimposed inflammatory change to suggest acute cholecystitis. Choledocholithiasis with at least 3 small calculi measuring up to 3 mm in diameter within the distal common duct. Mild superimposed extrahepatic biliary ductal dilation. Electronically Signed    By: AFidela SalisburyM.D.   On: 03/11/2021 01:00   MR ABDOMEN MRCP W WO CONTAST  Result Date: 03/11/2021 CLINICAL DATA:  Choledocholithiasis, acute cholecystitis EXAM: MRI ABDOMEN WITHOUT AND WITH CONTRAST (INCLUDING MRCP) TECHNIQUE: Multiplanar multisequence MR imaging of the abdomen was performed both before and after the administration of intravenous contrast. Heavily T2-weighted images of the biliary and pancreatic ducts were obtained, and three-dimensional MRCP images were rendered by post processing. CONTRAST:  134mGADAVIST GADOBUTROL 1 MMOL/ML IV SOLN COMPARISON:  Sonogram 03/10/2021 FINDINGS: Lower chest: Bilateral breast implants are visualized. Cardiac size within normal limits. Hepatobiliary: Normal hepatic parenchymal signal intensity. No focal intrahepatic masses are identified. No intrahepatic biliary ductal dilation. The extrahepatic bile duct is dilated measuring 11 mm in diameter. There are at least 3 intraluminal filling defects identified within the distal common duct in keeping with small stones measuring up to 3 mm in greatest dimension. Multiple layering gallstones are seen within the gallbladder along with a small amount of sludge. The gallbladder is mildly distended. No pericholecystic inflammatory stranding or gallbladder hydrops is identified to suggest superimposed acute cholecystitis. Pancreas: No mass, inflammatory changes, or other parenchymal abnormality identified. No divisum Spleen:  Within normal limits in size and appearance. Adrenals/Urinary Tract: The adrenal glands are unremarkable. The kidneys are normal in size and position. No hydronephrosis. Simple cortical cyst noted within the mid to upper pole the left kidney. Stomach/Bowel: Visualized portions within the abdomen are unremarkable. Vascular/Lymphatic: No pathologically enlarged lymph nodes identified. No abdominal aortic aneurysm demonstrated. Other:  None. Musculoskeletal: No suspicious bone lesions identified.  IMPRESSION: Cholelithiasis without superimposed inflammatory change to suggest  acute cholecystitis. Choledocholithiasis with at least 3 small calculi measuring up to 3 mm in diameter within the distal common duct. Mild superimposed extrahepatic biliary ductal dilation. Electronically Signed   By: Fidela Salisbury M.D.   On: 03/11/2021 01:00   US Abdomen Limited RUQ (LIVER/GB)  Result Date: 03/10/2021 CLINICAL DATA:  Right upper quadrant pain EXAM: ULTRASOUND ABDOMEN LIMITED RIGHT UPPER QUADRANT COMPARISON:  Abdominal ultrasound 07/28/2017 FINDINGS: Gallbladder: A few dependent echogenic shadowing calculi measuring up to 1 cm in size. No significant wall thickening or pericholecystic fluid visualized. Positive sonographic Murphy's sign per the technologist. Common bile duct: Diameter: 10 mm Liver: Coarse, increased echogenicity of the parenchyma with no focal mass identified. Portal vein is patent on color Doppler imaging with normal direction of blood flow towards the liver. Other: None. IMPRESSION: 1. Cholelithiasis with positive sonographic Murphy's sign per the technologist, suggesting acute calculus cholecystitis. 2. Dilated common bile duct, correlate for evidence of obstruction and consider MRCP if indicated. 3. Evidence of hepatic steatosis. Electronically Signed   By: Ofilia Neas M.D.   On: 03/10/2021 12:11    Anti-infectives (From admission, onward)    Start     Dose/Rate Route Frequency Ordered Stop   03/11/21 0000  piperacillin-tazobactam (ZOSYN) IVPB 3.375 g        3.375 g 12.5 mL/hr over 240 Minutes Intravenous Every 8 hours 03/10/21 2336     03/10/21 1345  piperacillin-tazobactam (ZOSYN) IVPB 3.375 g        3.375 g 100 mL/hr over 30 Minutes Intravenous  Once 03/10/21 1330 03/10/21 1635         Assessment/Plan Choledocholithiasis - Patient is scheduled for ERCP tomorrow 03/12/21. Barring no complications from this we will tentatively plan for lap chole on 03/13/21. We will  follow.  ID - zosyn 12/21>> VTE - SCDs, ok for chemical dvt prophylaxis from surgical standpoint FEN - IVF, CLD, NPO after MN Foley - none  Asthma Lynch syndrome Obesity BMI 46.64  Wellington Hampshire, [facility] 03/11/2021, 3:58 PM Please see Amion for pager number during day hours 7:00am-4:30pm

## 2021-03-11 NOTE — Progress Notes (Signed)
PROGRESS NOTE    Lynn Simpson  UMP:[id number] DOB: 1971-01-20 DOA: 03/10/2021 PCP: Haywood Pao, MD   Brief Narrative: Lynn Simpson is a 50 y.o. female with a histoy of Lynch syndrome. She presented secondary to RUQ pain and acholic stools with evidence of cholecystitis and choledocholithiasis on imaging. Plan for ERCP followed by cholecystectomy.   Assessment & Plan:   * Choledocholithiasis with acute cholecystitis- (present on admission) Confirmed on MRCP. Gastroenterology consulted and plan ERCP on 12/23. GI to consult general surgery for cholecystectomy post-ERCP. LFTs trending down today. Bilirubin slightly elevated from previous.  Elevated LFTs Likely due to obstructive biliary process. Acute hepatitis panel is negative. Improved slightly with rising bilirubin.      DVT prophylaxis: SCDs Code Status:   Code Status: Full Code Family Communication: None at bedside Disposition Plan: Discharge home likely in 2-3 days pending GI and general surgery recommendations/management   Consultants:  [facility]  Procedures:  None  Antimicrobials: Zosyn IV    Subjective: No significant issues this morning. Pain has improved.  Objective: Vitals:   03/10/21 1300 03/10/21 1700 03/10/21 2002 03/10/21 2250  BP: 115/69 110/67 (!) 142/71 122/77  Pulse: 71 81 78 85  Resp: 16 11 17 18   Temp:    98 F (36.7 C)  TempSrc:    Oral  SpO2: 97% 99% 94% 96%  Weight:      Height:        Intake/Output Summary (Last 24 hours) at 03/11/2021 0606 Last data filed at 03/11/2021 0354 Gross per 24 hour  Intake 1145.07 ml  Output --  Net 1145.07 ml   Filed Weights   03/10/21 0949  Weight: 115.7 kg    Examination:  General exam: Appears calm and comfortable  Respiratory system: Clear to auscultation. Respiratory effort normal. Cardiovascular system: S1 & S2 heard, RRR. No murmurs, rubs, gallops or clicks. Gastrointestinal system: Abdomen is nondistended, soft  and no significant tenderness. No organomegaly or masses felt. Normal bowel sounds heard. Central nervous system: Alert and oriented. No focal neurological deficits. Musculoskeletal: No edema. No calf tenderness Skin: No cyanosis. No rashes Psychiatry: Judgement and insight appear normal. Mood & affect appropriate.     Data Reviewed: I have personally reviewed following labs and imaging studies  CBC Lab Results  Component Value Date   WBC 6.2 03/10/2021   RBC 5.05 03/10/2021   HGB 15.3 (H) 03/10/2021   HCT 45.3 03/10/2021   MCV 89.7 03/10/2021   MCH 30.3 03/10/2021   PLT 180 03/10/2021   MCHC 33.8 03/10/2021   RDW 12.3 03/10/2021   LYMPHSABS 1.9 03/10/2021   MONOABS 0.3 03/10/2021   EOSABS 0.4 03/10/2021   BASOSABS 0.0 40/10/6759     Last metabolic panel Lab Results  Component Value Date   NA 137 03/10/2021   K 3.6 03/10/2021   CL 101 03/10/2021   CO2 27 03/10/2021   BUN 12 03/10/2021   CREATININE 0.52 03/10/2021   GLUCOSE 99 03/10/2021   GFRNONAA >60 03/10/2021   GFRAA 122 08/21/2018   CALCIUM 8.9 03/10/2021   PROT 7.7 03/10/2021   ALBUMIN 4.1 03/10/2021   LABGLOB 2.1 08/21/2018   AGRATIO 2.1 08/21/2018   BILITOT 2.8 (H) 03/10/2021   ALKPHOS 207 (H) 03/10/2021   AST 604 (H) 03/10/2021   ALT 888 (H) 03/10/2021   ANIONGAP 9 03/10/2021    CBG (last 3)  No results for input(s): GLUCAP in the last 72 hours.   GFR: Estimated Creatinine Clearance:  103.6 mL/min (by C-G formula based on SCr of 0.52 mg/dL).  Coagulation Profile: Recent Labs  Lab 03/10/21 1211  INR 1.0    Recent Results (from the past 240 hour(s))  Resp Panel by RT-PCR (Flu A&B, Covid) Nasopharyngeal Swab     Status: None   Collection Time: 03/10/21 10:06 AM   Specimen: Nasopharyngeal Swab; Nasopharyngeal(NP) swabs in vial transport medium  Result Value Ref Range Status   SARS Coronavirus 2 by RT PCR NEGATIVE NEGATIVE Final    Comment: (NOTE) SARS-CoV-2 target nucleic acids are NOT  DETECTED.  The SARS-CoV-2 RNA is generally detectable in upper respiratory specimens during the acute phase of infection. The lowest concentration of SARS-CoV-2 viral copies this assay can detect is 138 copies/mL. A negative result does not preclude SARS-Cov-2 infection and should not be used as the sole basis for treatment or other patient management decisions. A negative result may occur with  improper specimen collection/handling, submission of specimen other than nasopharyngeal swab, presence of viral mutation(s) within the areas targeted by this assay, and inadequate number of viral copies(<138 copies/mL). A negative result must be combined with clinical observations, patient history, and epidemiological information. The expected result is Negative.  Fact Sheet for Patients:  EntrepreneurPulse.com.au  Fact Sheet for Healthcare Providers:  IncredibleEmployment.be  This test is no t yet approved or cleared by the [place] FDA and  has been authorized for detection and/or diagnosis of SARS-CoV-2 by FDA under an Emergency Use Authorization (EUA). This EUA will remain  in effect (meaning this test can be used) for the duration of the COVID-19 declaration under Section 564(b)(1) of the Act, 21 U.S.C.section 360bbb-3(b)(1), unless the authorization is terminated  or revoked sooner.       Influenza A by PCR NEGATIVE NEGATIVE Final   Influenza B by PCR NEGATIVE NEGATIVE Final    Comment: (NOTE) The Xpert Xpress SARS-CoV-2/FLU/RSV plus assay is intended as an aid in the diagnosis of influenza from Nasopharyngeal swab specimens and should not be used as a sole basis for treatment. Nasal washings and aspirates are unacceptable for Xpert Xpress SARS-CoV-2/FLU/RSV testing.  Fact Sheet for Patients: EntrepreneurPulse.com.au  Fact Sheet for Healthcare Providers: IncredibleEmployment.be  This test is not yet  approved or cleared by the [place] FDA and has been authorized for detection and/or diagnosis of SARS-CoV-2 by FDA under an Emergency Use Authorization (EUA). This EUA will remain in effect (meaning this test can be used) for the duration of the COVID-19 declaration under Section 564(b)(1) of the Act, 21 U.S.C. section 360bbb-3(b)(1), unless the authorization is terminated or revoked.  Performed at [facility], [address] [place], [place] [address]         Radiology Studies: MR 3D Recon At Scanner  Result Date: 03/11/2021 CLINICAL DATA:  Choledocholithiasis, acute cholecystitis EXAM: MRI ABDOMEN WITHOUT AND WITH CONTRAST (INCLUDING MRCP) TECHNIQUE: Multiplanar multisequence MR imaging of the abdomen was performed both before and after the administration of intravenous contrast. Heavily T2-weighted images of the biliary and pancreatic ducts were obtained, and three-dimensional MRCP images were rendered by post processing. CONTRAST:  16mL GADAVIST GADOBUTROL 1 MMOL/ML IV SOLN COMPARISON:  Sonogram 03/10/2021 FINDINGS: Lower chest: Bilateral breast implants are visualized. Cardiac size within normal limits. Hepatobiliary: Normal hepatic parenchymal signal intensity. No focal intrahepatic masses are identified. No intrahepatic biliary ductal dilation. The extrahepatic bile duct is dilated measuring 11 mm in diameter. There are at least 3 intraluminal filling defects identified within the distal common duct  in keeping with small stones measuring up to 3 mm in greatest dimension. Multiple layering gallstones are seen within the gallbladder along with a small amount of sludge. The gallbladder is mildly distended. No pericholecystic inflammatory stranding or gallbladder hydrops is identified to suggest superimposed acute cholecystitis. Pancreas: No mass, inflammatory changes, or other parenchymal abnormality identified. No divisum Spleen:  Within normal limits in size and  appearance. Adrenals/Urinary Tract: The adrenal glands are unremarkable. The kidneys are normal in size and position. No hydronephrosis. Simple cortical cyst noted within the mid to upper pole the left kidney. Stomach/Bowel: Visualized portions within the abdomen are unremarkable. Vascular/Lymphatic: No pathologically enlarged lymph nodes identified. No abdominal aortic aneurysm demonstrated. Other:  None. Musculoskeletal: No suspicious bone lesions identified. IMPRESSION: Cholelithiasis without superimposed inflammatory change to suggest acute cholecystitis. Choledocholithiasis with at least 3 small calculi measuring up to 3 mm in diameter within the distal common duct. Mild superimposed extrahepatic biliary ductal dilation. Electronically Signed   By: Fidela Salisbury M.D.   On: 03/11/2021 01:00   MR ABDOMEN MRCP W WO CONTAST  Result Date: 03/11/2021 CLINICAL DATA:  Choledocholithiasis, acute cholecystitis EXAM: MRI ABDOMEN WITHOUT AND WITH CONTRAST (INCLUDING MRCP) TECHNIQUE: Multiplanar multisequence MR imaging of the abdomen was performed both before and after the administration of intravenous contrast. Heavily T2-weighted images of the biliary and pancreatic ducts were obtained, and three-dimensional MRCP images were rendered by post processing. CONTRAST:  27mL GADAVIST GADOBUTROL 1 MMOL/ML IV SOLN COMPARISON:  Sonogram 03/10/2021 FINDINGS: Lower chest: Bilateral breast implants are visualized. Cardiac size within normal limits. Hepatobiliary: Normal hepatic parenchymal signal intensity. No focal intrahepatic masses are identified. No intrahepatic biliary ductal dilation. The extrahepatic bile duct is dilated measuring 11 mm in diameter. There are at least 3 intraluminal filling defects identified within the distal common duct in keeping with small stones measuring up to 3 mm in greatest dimension. Multiple layering gallstones are seen within the gallbladder along with a small amount of sludge. The  gallbladder is mildly distended. No pericholecystic inflammatory stranding or gallbladder hydrops is identified to suggest superimposed acute cholecystitis. Pancreas: No mass, inflammatory changes, or other parenchymal abnormality identified. No divisum Spleen:  Within normal limits in size and appearance. Adrenals/Urinary Tract: The adrenal glands are unremarkable. The kidneys are normal in size and position. No hydronephrosis. Simple cortical cyst noted within the mid to upper pole the left kidney. Stomach/Bowel: Visualized portions within the abdomen are unremarkable. Vascular/Lymphatic: No pathologically enlarged lymph nodes identified. No abdominal aortic aneurysm demonstrated. Other:  None. Musculoskeletal: No suspicious bone lesions identified. IMPRESSION: Cholelithiasis without superimposed inflammatory change to suggest acute cholecystitis. Choledocholithiasis with at least 3 small calculi measuring up to 3 mm in diameter within the distal common duct. Mild superimposed extrahepatic biliary ductal dilation. Electronically Signed   By: Fidela Salisbury M.D.   On: 03/11/2021 01:00   US Abdomen Limited RUQ (LIVER/GB)  Result Date: 03/10/2021 CLINICAL DATA:  Right upper quadrant pain EXAM: ULTRASOUND ABDOMEN LIMITED RIGHT UPPER QUADRANT COMPARISON:  Abdominal ultrasound 07/28/2017 FINDINGS: Gallbladder: A few dependent echogenic shadowing calculi measuring up to 1 cm in size. No significant wall thickening or pericholecystic fluid visualized. Positive sonographic Murphy's sign per the technologist. Common bile duct: Diameter: 10 mm Liver: Coarse, increased echogenicity of the parenchyma with no focal mass identified. Portal vein is patent on color Doppler imaging with normal direction of blood flow towards the liver. Other: None. IMPRESSION: 1. Cholelithiasis with positive sonographic Murphy's sign per the technologist, suggesting acute calculus cholecystitis.  2. Dilated common bile duct, correlate for  evidence of obstruction and consider MRCP if indicated. 3. Evidence of hepatic steatosis. Electronically Signed   By: Ofilia Neas M.D.   On: 03/10/2021 12:11        Scheduled Meds: Continuous Infusions:  sodium chloride     piperacillin-tazobactam (ZOSYN)  IV 3.375 g (03/11/21 0055)     LOS: 1 day     Cordelia Poche, MD [facility] 03/11/2021, 6:06 AM  If 7PM-7AM, please contact night-coverage [email or web address]

## 2021-03-12 ENCOUNTER — Inpatient Hospital Stay (HOSPITAL_COMMUNITY): Payer: BC Managed Care – PPO | Admitting: Anesthesiology

## 2021-03-12 ENCOUNTER — Encounter (HOSPITAL_COMMUNITY)
Admission: EM | Disposition: A | Discharge status: Other Acute Inpatient Hospital | Payer: Self-pay | Source: Home / Self Care | Attending: Family Medicine

## 2021-03-12 ENCOUNTER — Encounter (HOSPITAL_COMMUNITY): Payer: Self-pay | Admitting: Internal Medicine

## 2021-03-12 ENCOUNTER — Inpatient Hospital Stay (HOSPITAL_COMMUNITY): Payer: BC Managed Care – PPO

## 2021-03-12 DIAGNOSIS — K838 Other specified diseases of biliary tract: Secondary | ICD-10-CM | POA: Diagnosis not present

## 2021-03-12 DIAGNOSIS — R17 Unspecified jaundice: Secondary | ICD-10-CM | POA: Diagnosis not present

## 2021-03-12 DIAGNOSIS — K8050 Calculus of bile duct without cholangitis or cholecystitis without obstruction: Secondary | ICD-10-CM | POA: Diagnosis not present

## 2021-03-12 HISTORY — PX: PANCREATIC STENT PLACEMENT: SHX5539

## 2021-03-12 HISTORY — PX: SPHINCTEROTOMY: SHX5544

## 2021-03-12 HISTORY — PX: ERCP: SHX5425

## 2021-03-12 LAB — HEPATIC FUNCTION PANEL
ALT: 533 U/L — ABNORMAL HIGH (ref 0–44)
AST: 244 U/L — ABNORMAL HIGH (ref 15–41)
Albumin: 3.6 g/dL (ref 3.5–5.0)
Alkaline Phosphatase: 196 U/L — ABNORMAL HIGH (ref 38–126)
Bilirubin, Direct: 1.4 mg/dL — ABNORMAL HIGH (ref 0.0–0.2)
Indirect Bilirubin: 1.1 mg/dL — ABNORMAL HIGH (ref 0.3–0.9)
Total Bilirubin: 2.5 mg/dL — ABNORMAL HIGH (ref 0.3–1.2)
Total Protein: 6.5 g/dL (ref 6.5–8.1)

## 2021-03-12 LAB — CBC
HCT: 39.8 % (ref 36.0–46.0)
Hemoglobin: 13.5 g/dL (ref 12.0–15.0)
MCH: 30.8 pg (ref 26.0–34.0)
MCHC: 33.9 g/dL (ref 30.0–36.0)
MCV: 90.9 fL (ref 80.0–100.0)
Platelets: 152 10*3/uL (ref 150–400)
RBC: 4.38 MIL/uL (ref 3.87–5.11)
RDW: 12.2 % (ref 11.5–15.5)
WBC: 5.9 10*3/uL (ref 4.0–10.5)
nRBC: 0 % (ref 0.0–0.2)

## 2021-03-12 LAB — BASIC METABOLIC PANEL
Anion gap: 7 (ref 5–15)
BUN: 8 mg/dL (ref 6–20)
CO2: 26 mmol/L (ref 22–32)
Calcium: 8.7 mg/dL — ABNORMAL LOW (ref 8.9–10.3)
Chloride: 106 mmol/L (ref 98–111)
Creatinine, Ser: 0.74 mg/dL (ref 0.44–1.00)
GFR, Estimated: >60 mL/min (ref >60)
Glucose, Bld: 92 mg/dL (ref 70–99)
Potassium: 3.6 mmol/L (ref 3.5–5.1)
Sodium: 139 mmol/L (ref 135–145)

## 2021-03-12 LAB — LIPASE, BLOOD: Lipase: 25 U/L (ref 11–51)

## 2021-03-12 IMAGING — RF DG ERCP WO/W SPHINCTEROTOMY
1 series · 4 of 4 positions shown · non-contrast
Comparison: MRCP [DATE]

CLINICAL DATA: Choledocholithiasis with acute cholecystitis

EXAM:
ERCP
TECHNIQUE: Multiple spot images obtained with the fluoroscopic device and
submitted for interpretation post-procedure.
FLUOROSCOPY TIME:  Fluoroscopy Time:  4 minutes 4 seconds
Number of Acquired Spot Images: 0

[Series 1: run · 4 of 4 slices shown]
[im 1/4]
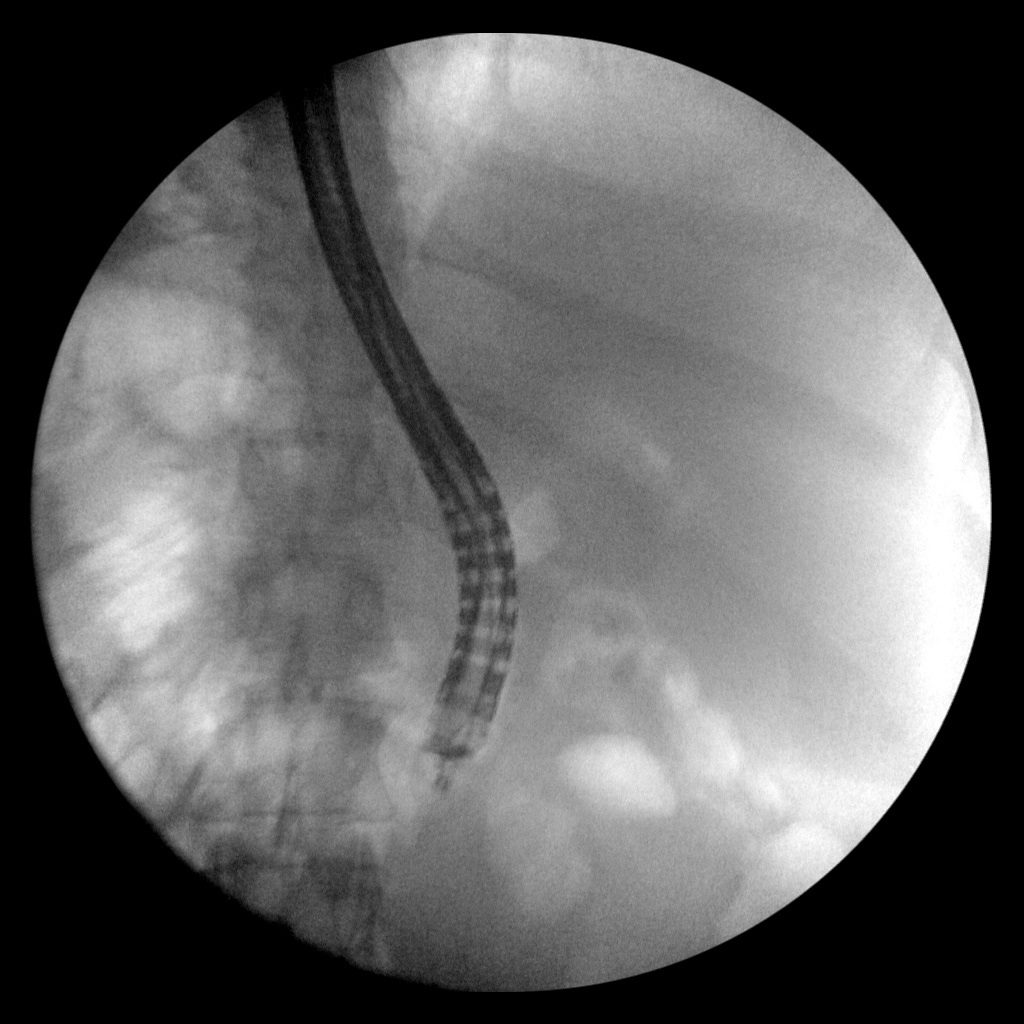
[im 2/4]
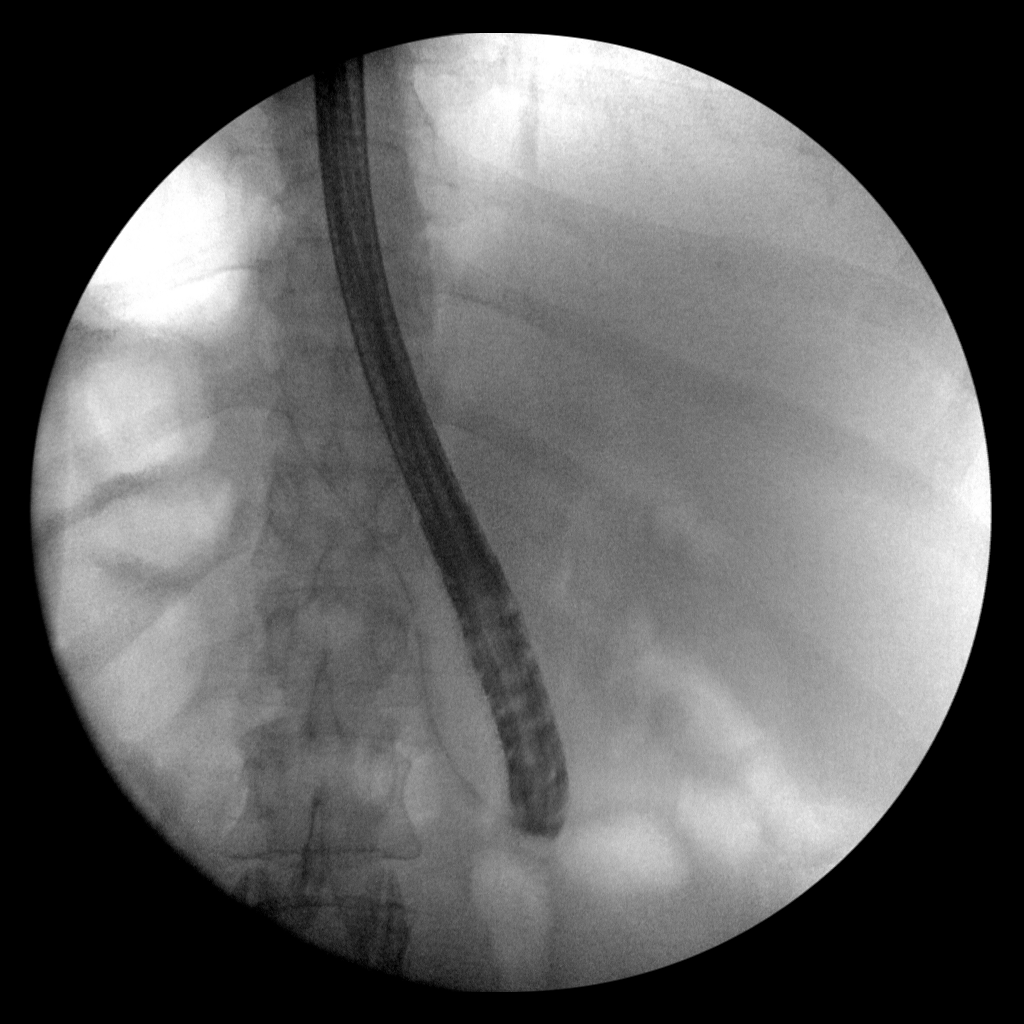
[im 3/4]
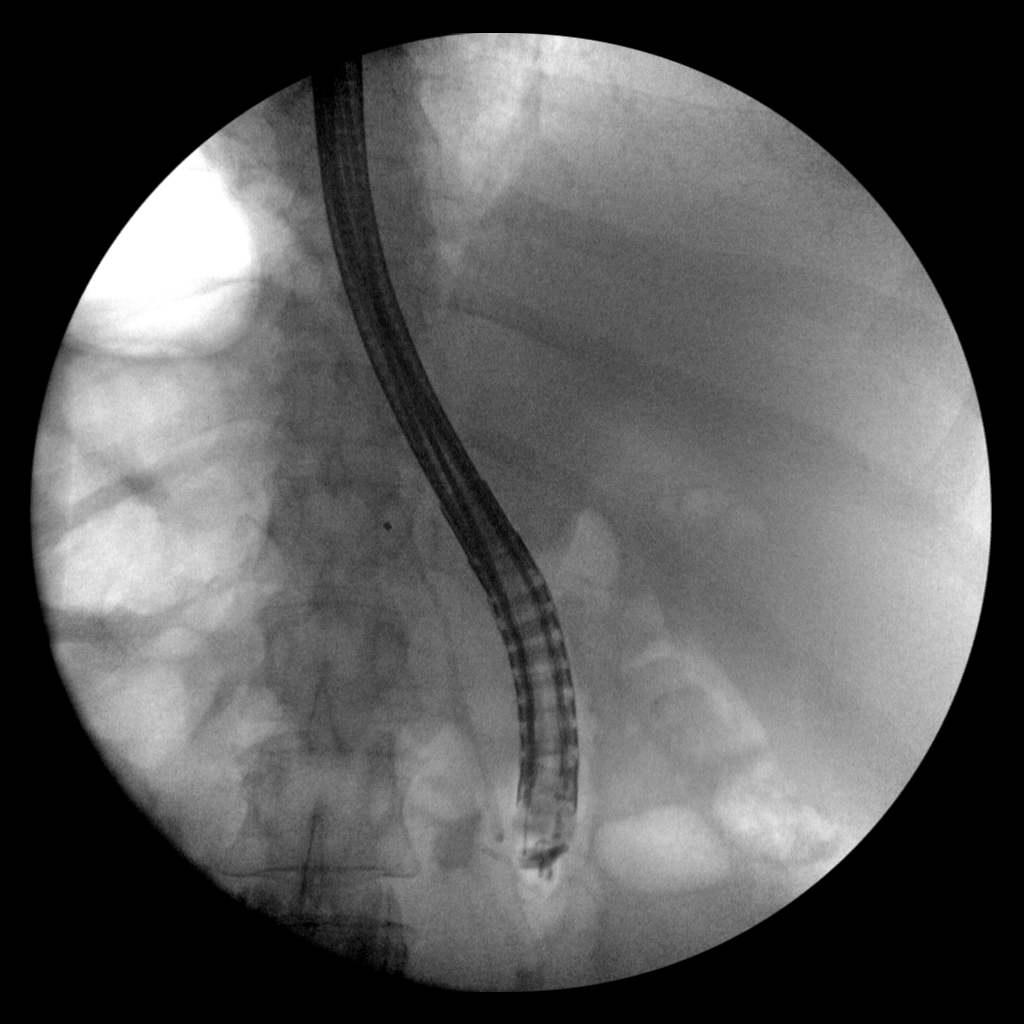
[im 4/4]
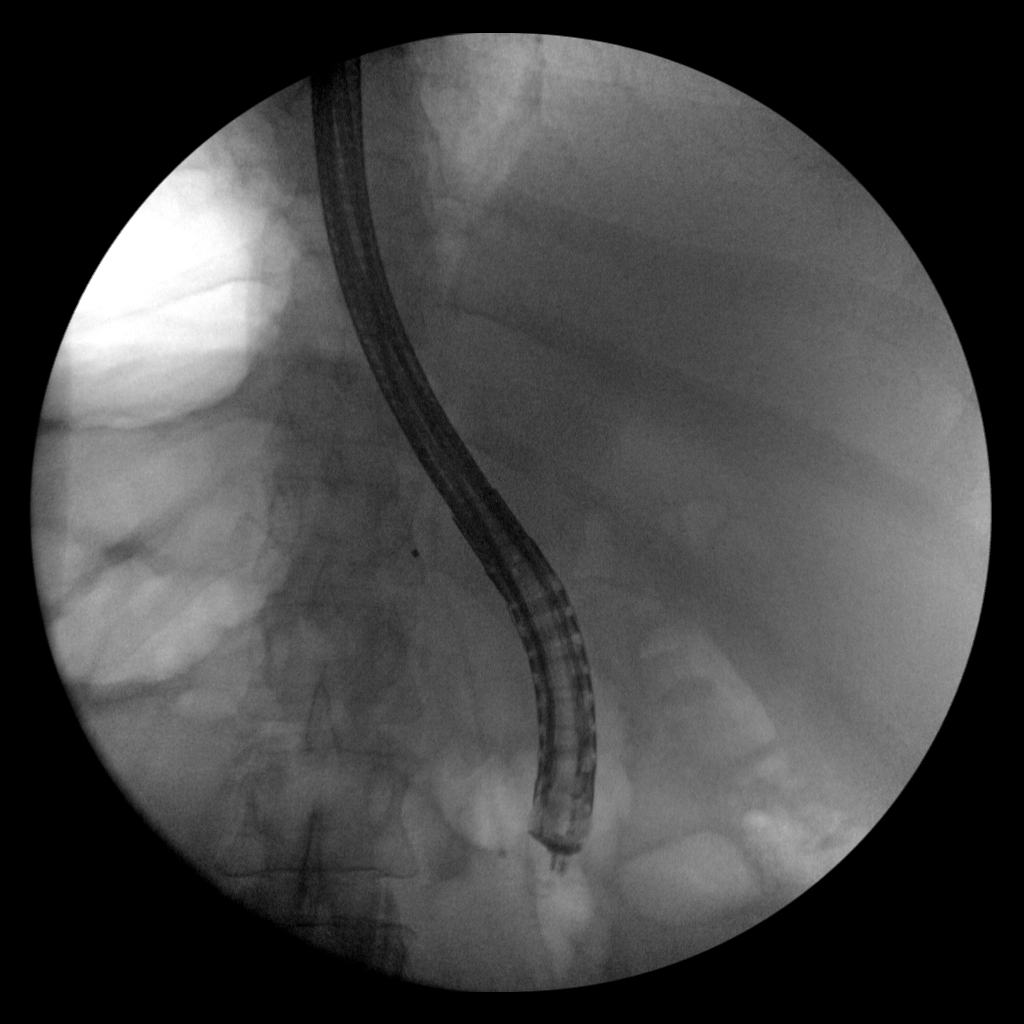

[4 of 4 positions shown; findings below may reference images not displayed]

FINDINGS: Four intraoperative saved images are submitted for review
demonstrating a flexible duodenal scope in the descending duodenum
with wire cannulation of the common bile duct and placement of a
biliary stent.
IMPRESSION: ERCP as above.

These images were submitted for radiologic interpretation only.
Please see the procedural report for the amount of contrast and the
fluoroscopy time utilized.

## 2021-03-12 SURGERY — ERCP, WITH INTERVENTION IF INDICATED
Anesthesia: General

## 2021-03-12 MED ORDER — PANTOPRAZOLE SODIUM 20 MG PO TBEC
20.0000 mg | DELAYED_RELEASE_TABLET | Freq: Every day | ORAL | Status: DC
  Administered 2021-03-12 – 2021-03-18 (×5): 20 mg via ORAL
  Filled 2021-03-12 (×8): qty 1

## 2021-03-12 MED ORDER — ONDANSETRON HCL 4 MG PO TABS: 4.0000 mg | ORAL_TABLET | Freq: Four times a day (QID) | ORAL | Status: DC | PRN

## 2021-03-12 MED ORDER — ALBUTEROL SULFATE HFA 108 (90 BASE) MCG/ACT IN AERS
INHALATION_SPRAY | RESPIRATORY_TRACT | Status: DC | PRN
  Administered 2021-03-12: 3 via RESPIRATORY_TRACT

## 2021-03-12 MED ORDER — ONDANSETRON HCL 4 MG/2ML IJ SOLN
INTRAMUSCULAR | Status: DC | PRN
  Administered 2021-03-12: 4 mg via INTRAVENOUS

## 2021-03-12 MED ORDER — PROPOFOL 500 MG/50ML IV EMUL
INTRAVENOUS | Status: DC | PRN
  Administered 2021-03-12: 75 ug/kg/min via INTRAVENOUS

## 2021-03-12 MED ORDER — INDOMETHACIN 50 MG RE SUPP
RECTAL | Status: DC | PRN
  Administered 2021-03-12: 100 mg via RECTAL

## 2021-03-12 MED ORDER — CHLORHEXIDINE GLUCONATE CLOTH 2 % EX PADS: 6.0000 | MEDICATED_PAD | Freq: Once | CUTANEOUS | Status: DC

## 2021-03-12 MED ORDER — SODIUM CHLORIDE (PF) 0.9 % IJ SOLN
INTRAMUSCULAR | Status: DC | PRN
  Administered 2021-03-12: 16:00:00 5 mL

## 2021-03-12 MED ORDER — ACETAMINOPHEN 325 MG PO TABS: 650.0000 mg | ORAL_TABLET | Freq: Four times a day (QID) | ORAL | Status: DC | PRN

## 2021-03-12 MED ORDER — LACTATED RINGERS IV SOLN: INTRAVENOUS | Status: DC

## 2021-03-12 MED ORDER — CEFAZOLIN SODIUM-DEXTROSE 2-4 GM/100ML-% IV SOLN
2.0000 g | INTRAVENOUS | Status: DC
  Filled 2021-03-12: qty 100

## 2021-03-12 MED ORDER — INDOMETHACIN 50 MG RE SUPP: 100.0000 mg | Freq: Once | RECTAL | Status: DC

## 2021-03-12 MED ORDER — LIDOCAINE 2% (20 MG/ML) 5 ML SYRINGE
INTRAMUSCULAR | Status: DC | PRN
  Administered 2021-03-12: 80 mg via INTRAVENOUS

## 2021-03-12 MED ORDER — HYDROMORPHONE HCL 1 MG/ML IJ SOLN
0.5000 mg | INTRAMUSCULAR | Status: DC | PRN
  Administered 2021-03-13 – 2021-03-15 (×4): 0.5 mg via INTRAVENOUS
  Filled 2021-03-12 (×5): qty 0.5

## 2021-03-12 MED ORDER — ESMOLOL HCL 100 MG/10ML IV SOLN
INTRAVENOUS | Status: DC | PRN
  Administered 2021-03-12: 25 mg via INTRAVENOUS

## 2021-03-12 MED ORDER — ACETAMINOPHEN 650 MG RE SUPP: 650.0000 mg | Freq: Four times a day (QID) | RECTAL | Status: DC | PRN

## 2021-03-12 MED ORDER — ROCURONIUM BROMIDE 10 MG/ML (PF) SYRINGE
PREFILLED_SYRINGE | INTRAVENOUS | Status: DC | PRN
  Administered 2021-03-12: 80 mg via INTRAVENOUS

## 2021-03-12 MED ORDER — FENTANYL CITRATE (PF) 100 MCG/2ML IJ SOLN
INTRAMUSCULAR | Status: AC
  Filled 2021-03-12: qty 2

## 2021-03-12 MED ORDER — FENTANYL CITRATE (PF) 100 MCG/2ML IJ SOLN
INTRAMUSCULAR | Status: DC | PRN
  Administered 2021-03-12: 100 ug via INTRAVENOUS

## 2021-03-12 MED ORDER — SODIUM CHLORIDE 0.9 % IV SOLN: INTRAVENOUS | Status: DC

## 2021-03-12 MED ORDER — PROPOFOL 1000 MG/100ML IV EMUL
INTRAVENOUS | Status: AC
  Filled 2021-03-12: qty 100

## 2021-03-12 MED ORDER — FAMOTIDINE IN NACL 20-0.9 MG/50ML-% IV SOLN
20.0000 mg | Freq: Two times a day (BID) | INTRAVENOUS | Status: DC
  Filled 2021-03-12: qty 50

## 2021-03-12 MED ORDER — INDOMETHACIN 50 MG RE SUPP
RECTAL | Status: AC
  Filled 2021-03-12: qty 2

## 2021-03-12 MED ORDER — ONDANSETRON HCL 4 MG/2ML IJ SOLN
4.0000 mg | Freq: Four times a day (QID) | INTRAMUSCULAR | Status: DC | PRN
  Administered 2021-03-13 – 2021-03-18 (×6): 4 mg via INTRAVENOUS
  Filled 2021-03-12 (×6): qty 2

## 2021-03-12 MED ORDER — GLUCAGON HCL RDNA (DIAGNOSTIC) 1 MG IJ SOLR
INTRAMUSCULAR | Status: DC | PRN
  Administered 2021-03-12 (×7): .25 mg via INTRAVENOUS

## 2021-03-12 MED ORDER — DEXAMETHASONE SODIUM PHOSPHATE 10 MG/ML IJ SOLN
INTRAMUSCULAR | Status: DC | PRN
  Administered 2021-03-12: 10 mg via INTRAVENOUS

## 2021-03-12 MED ORDER — SUGAMMADEX SODIUM 500 MG/5ML IV SOLN
INTRAVENOUS | Status: DC | PRN
  Administered 2021-03-12: 350 mg via INTRAVENOUS

## 2021-03-12 MED ORDER — PROPOFOL 10 MG/ML IV BOLUS
INTRAVENOUS | Status: DC | PRN
  Administered 2021-03-12: 120 mg via INTRAVENOUS
  Administered 2021-03-12: 80 mg via INTRAVENOUS

## 2021-03-12 MED ORDER — ALBUTEROL SULFATE HFA 108 (90 BASE) MCG/ACT IN AERS
INHALATION_SPRAY | RESPIRATORY_TRACT | Status: AC
  Filled 2021-03-12: qty 6.7

## 2021-03-12 NOTE — Anesthesia Preprocedure Evaluation (Addendum)
Anesthesia Evaluation  Patient identified by MRN, date of birth, ID band Patient awake    Reviewed: Allergy & Precautions, NPO status , Patient's Chart, lab work & pertinent test results  History of Anesthesia Complications Negative for: history of anesthetic complications  Airway Mallampati: II  TM Distance: >3 FB Neck ROM: Full    Dental no notable dental hx. (+) Dental Advisory Given   Pulmonary Current Smoker, former smoker,    Pulmonary exam normal        Cardiovascular negative cardio ROS Normal cardiovascular exam     Neuro/Psych PSYCHIATRIC DISORDERS Anxiety Depression negative neurological ROS     GI/Hepatic Neg liver ROS, GERD  Medicated,  Endo/Other  Morbid obesity  Renal/GU negative Renal ROS  negative genitourinary   Musculoskeletal negative musculoskeletal ROS (+)   Abdominal (+) + obese,   Peds negative pediatric ROS (+)  Hematology negative hematology ROS (+)   Anesthesia Other Findings   Reproductive/Obstetrics negative OB ROS                            Anesthesia Physical  Anesthesia Plan  ASA: 3  Anesthesia Plan: General   Post-op Pain Management:    Induction: Intravenous  PONV Risk Score and Plan: 3 and Ondansetron, Dexamethasone, Diphenhydramine and Midazolam  Airway Management Planned: Oral ETT  Additional Equipment:   Intra-op Plan:   Post-operative Plan: Extubation in OR  Informed Consent: I have reviewed the patients History and Physical, chart, labs and discussed the procedure including the risks, benefits and alternatives for the proposed anesthesia with the patient or authorized representative who has indicated his/her understanding and acceptance.     Dental advisory given  Plan Discussed with: Anesthesiologist and CRNA  Anesthesia Plan Comments:        Anesthesia Quick Evaluation

## 2021-03-12 NOTE — H&P (View-Only) (Signed)
[facility] Progress Note  CC:   Biliary obstruction    Subjective: She feels well this morning. No N/V or abdominal pain. She passed a gray colored stool yesterday evening. Urine was a little lighter last night then darker again this morning. No CP or SOB. She remains NPO for ERCP this afternoon.   Objective:  Vital signs in last 24 hours: Temp:  [97.8 F (36.6 C)-98.1 F (36.7 C)] 97.8 F (36.6 C) (12/23 0552) Pulse Rate:  [78-88] 78 (12/23 0552) Resp:  [18] 18 (12/22 1900) BP: (110-141)/(74-95) 125/90 (12/23 0552) SpO2:  [95 %-96 %] 95 % (12/23 0552) Last BM Date: 03/10/21 General:   Alert in NAD. Eyes: No scleral icterus.  Heart: RRR, no murmur.  Pulm: Breath sounds clear throughout.  Abdomen: Soft, nondistended. Nontender. + BS x 4 quads.  Extremities:  Without edema. Neurologic:  Alert and  oriented x4. Grossly normal neurologically. Psych:  Alert and cooperative. Normal mood and affect. Skin: No jaundice  Intake/Output from previous day: 12/22 0701 - 12/23 0700 In: 946.3 [P.O.:240; I.V.:556.2; IV Piggyback:150] Out: -  Intake/Output this shift: No intake/output data recorded.  Lab Results: Recent Labs    03/10/21 1021 03/11/21 0554 03/12/21 0540  WBC 6.2 6.3 5.9  HGB 15.3* 14.4 13.5  HCT 45.3 42.8 39.8  PLT 180 166 152   BMET Recent Labs    03/10/21 1021 03/11/21 0554 03/12/21 0540  NA 137 139 139  K 3.6 3.3* 3.6  CL 101 105 106  CO2 27 24 26   GLUCOSE 99 75 92  BUN 12 11 8   CREATININE 0.52 0.67 0.74  CALCIUM 8.9 8.7* 8.7*   LFT Recent Labs    03/12/21 0540  PROT 6.5  ALBUMIN 3.6  AST 244*  ALT 533*  ALKPHOS 196*  BILITOT 2.5*  BILIDIR 1.4*  IBILI 1.1*   PT/INR Recent Labs    03/10/21 1211  LABPROT 13.2  INR 1.0   Hepatitis Panel Recent Labs    03/10/21 1211  HEPBSAG NON REACTIVE  HCVAB NON REACTIVE  HEPAIGM NON REACTIVE  HEPBIGM NON REACTIVE    MR 3D Recon At Scanner  Result Date:  03/11/2021 CLINICAL DATA:  Choledocholithiasis, acute cholecystitis EXAM: MRI ABDOMEN WITHOUT AND WITH CONTRAST (INCLUDING MRCP) TECHNIQUE: Multiplanar multisequence MR imaging of the abdomen was performed both before and after the administration of intravenous contrast. Heavily T2-weighted images of the biliary and pancreatic ducts were obtained, and three-dimensional MRCP images were rendered by post processing. CONTRAST:  12mL GADAVIST GADOBUTROL 1 MMOL/ML IV SOLN COMPARISON:  Sonogram 03/10/2021 FINDINGS: Lower chest: Bilateral breast implants are visualized. Cardiac size within normal limits. Hepatobiliary: Normal hepatic parenchymal signal intensity. No focal intrahepatic masses are identified. No intrahepatic biliary ductal dilation. The extrahepatic bile duct is dilated measuring 11 mm in diameter. There are at least 3 intraluminal filling defects identified within the distal common duct in keeping with small stones measuring up to 3 mm in greatest dimension. Multiple layering gallstones are seen within the gallbladder along with a small amount of sludge. The gallbladder is mildly distended. No pericholecystic inflammatory stranding or gallbladder hydrops is identified to suggest superimposed acute cholecystitis. Pancreas: No mass, inflammatory changes, or other parenchymal abnormality identified. No divisum Spleen:  Within normal limits in size and appearance. Adrenals/Urinary Tract: The adrenal glands are unremarkable. The kidneys are normal in size and position. No hydronephrosis. Simple cortical cyst noted within the mid to upper pole the left kidney. Stomach/Bowel: Visualized portions within  the abdomen are unremarkable. Vascular/Lymphatic: No pathologically enlarged lymph nodes identified. No abdominal aortic aneurysm demonstrated. Other:  None. Musculoskeletal: No suspicious bone lesions identified. IMPRESSION: Cholelithiasis without superimposed inflammatory change to suggest acute cholecystitis.  Choledocholithiasis with at least 3 small calculi measuring up to 3 mm in diameter within the distal common duct. Mild superimposed extrahepatic biliary ductal dilation. Electronically Signed   By: Fidela Salisbury M.D.   On: 03/11/2021 01:00   MR ABDOMEN MRCP W WO CONTAST  Result Date: 03/11/2021 CLINICAL DATA:  Choledocholithiasis, acute cholecystitis EXAM: MRI ABDOMEN WITHOUT AND WITH CONTRAST (INCLUDING MRCP) TECHNIQUE: Multiplanar multisequence MR imaging of the abdomen was performed both before and after the administration of intravenous contrast. Heavily T2-weighted images of the biliary and pancreatic ducts were obtained, and three-dimensional MRCP images were rendered by post processing. CONTRAST:  22mL GADAVIST GADOBUTROL 1 MMOL/ML IV SOLN COMPARISON:  Sonogram 03/10/2021 FINDINGS: Lower chest: Bilateral breast implants are visualized. Cardiac size within normal limits. Hepatobiliary: Normal hepatic parenchymal signal intensity. No focal intrahepatic masses are identified. No intrahepatic biliary ductal dilation. The extrahepatic bile duct is dilated measuring 11 mm in diameter. There are at least 3 intraluminal filling defects identified within the distal common duct in keeping with small stones measuring up to 3 mm in greatest dimension. Multiple layering gallstones are seen within the gallbladder along with a small amount of sludge. The gallbladder is mildly distended. No pericholecystic inflammatory stranding or gallbladder hydrops is identified to suggest superimposed acute cholecystitis. Pancreas: No mass, inflammatory changes, or other parenchymal abnormality identified. No divisum Spleen:  Within normal limits in size and appearance. Adrenals/Urinary Tract: The adrenal glands are unremarkable. The kidneys are normal in size and position. No hydronephrosis. Simple cortical cyst noted within the mid to upper pole the left kidney. Stomach/Bowel: Visualized portions within the abdomen are  unremarkable. Vascular/Lymphatic: No pathologically enlarged lymph nodes identified. No abdominal aortic aneurysm demonstrated. Other:  None. Musculoskeletal: No suspicious bone lesions identified. IMPRESSION: Cholelithiasis without superimposed inflammatory change to suggest acute cholecystitis. Choledocholithiasis with at least 3 small calculi measuring up to 3 mm in diameter within the distal common duct. Mild superimposed extrahepatic biliary ductal dilation. Electronically Signed   By: Fidela Salisbury M.D.   On: 03/11/2021 01:00   US Abdomen Limited RUQ (LIVER/GB)  Result Date: 03/10/2021 CLINICAL DATA:  Right upper quadrant pain EXAM: ULTRASOUND ABDOMEN LIMITED RIGHT UPPER QUADRANT COMPARISON:  Abdominal ultrasound 07/28/2017 FINDINGS: Gallbladder: A few dependent echogenic shadowing calculi measuring up to 1 cm in size. No significant wall thickening or pericholecystic fluid visualized. Positive sonographic Murphy's sign per the technologist. Common bile duct: Diameter: 10 mm Liver: Coarse, increased echogenicity of the parenchyma with no focal mass identified. Portal vein is patent on color Doppler imaging with normal direction of blood flow towards the liver. Other: None. IMPRESSION: 1. Cholelithiasis with positive sonographic Murphy's sign per the technologist, suggesting acute calculus cholecystitis. 2. Dilated common bile duct, correlate for evidence of obstruction and consider MRCP if indicated. 3. Evidence of hepatic steatosis. Electronically Signed   By: Ofilia Neas M.D.   On: 03/10/2021 12:11    Assessment / Plan:  39) 50 year old female with a 2 month history of recurrent N/V and LUQ pain admitted to the hospital 03/10/2021 with worsening N/V, LUQ and RUQ pain with the development of dark colored urine and acholic stools x 2 days. Labs in the ED consistent with acute cholestasis concerning for biliary obstruction. RUQ sonogram identified gallstones with a dilated CBD  choledocholithiasis/biliary obstruction. She was noted to have a positive sonographic Murphy sign concerning for acute cholecystitis.  Abdominal MRI/MRCP showed cholelithiasis without acute cholecystitis and choledocholithiasis with at least 3 small calculi in the distal common bile duct with mild superimposed extrahepatic biliary ductal dilatation. Stable LFTs over night. No further N/V or upper abdominal pain. She passed one gray colored stool last night. Urine is darker this morning. Seen by general surgery, planning on lap chole tomorrow. Hemodynamically stable.  -Continue IV Zosyn  -Continue IV fluids  -NPO -Proceed with ERCP as scheduled this afternoon with Dr. Rush Landmark    2) History of Lynch Syndrome -Surveillance colonoscopy scheduled as an outpatient on 04/12/2021  Principal Problem:   Choledocholithiasis with acute cholecystitis Active Problems:   Elevated LFTs     LOS: 2 days   Noralyn Pick  03/12/2021, 8:51 AM

## 2021-03-12 NOTE — Progress Notes (Signed)
PROGRESS NOTE    Lynn Simpson  JOA:[id number] DOB: February 25, 1971 DOA: 03/10/2021 PCP: Haywood Pao, MD   Brief Narrative: Lynn Simpson is a 50 y.o. female with a histoy of Lynch syndrome. She presented secondary to RUQ pain and acholic stools with evidence of cholecystitis and choledocholithiasis on imaging. Plan for ERCP followed by cholecystectomy.   Assessment & Plan:   * Choledocholithiasis with acute cholecystitis- (present on admission) Confirmed on MRCP. Gastroenterology consulted and plan ERCP on 12/23. GI to consult general surgery for cholecystectomy post-ERCP. LFTs and bilirubin trending. -GI recommendations: ERCP today -General surgery recommendations: laparoscopic cholecystectomy pending successful ERCP  Elevated LFTs Likely due to obstructive biliary process. Acute hepatitis panel is negative. Improving.     DVT prophylaxis: SCDs Code Status:   Code Status: Full Code Family Communication: Friend at bedside Disposition Plan: Discharge home likely in 1-2 days pending GI and general surgery recommendations/management   Consultants:  [facility]  Procedures:  None  Antimicrobials: Zosyn IV    Subjective: No significant pain. No concerns overnight.  Objective: Vitals:   03/11/21 1414 03/11/21 1900 03/12/21 0552 03/12/21 1206  BP: (!) 141/95 110/74 125/90 134/79  Pulse: 88 81 78 70  Resp: 18 18  (!) 21  Temp: 98.1 F (36.7 C) 98 F (36.7 C) 97.8 F (36.6 C) 97.7 F (36.5 C)  TempSrc: Oral Oral Oral Oral  SpO2: 96% 96% 95% 96%  Weight:      Height:        Intake/Output Summary (Last 24 hours) at 03/12/2021 1628 Last data filed at 03/12/2021 1614 Gross per 24 hour  Intake 2106.26 ml  Output --  Net 2106.26 ml    Filed Weights   03/10/21 0949  Weight: 115.7 kg    Examination:  General exam: Appears calm and comfortable Respiratory system: Clear to auscultation. Respiratory effort normal. Cardiovascular system: S1 &  S2 heard, RRR. No murmurs, rubs, gallops or clicks. Gastrointestinal system: Abdomen is minimally distended, soft and nontender. No organomegaly or masses felt. Normal bowel sounds heard. Central nervous system: Alert and oriented. No focal neurological deficits. Musculoskeletal: No edema. No calf tenderness Skin: No cyanosis. No rashes Psychiatry: Judgement and insight appear normal. Mood & affect appropriate.     Data Reviewed: I have personally reviewed following labs and imaging studies  CBC Lab Results  Component Value Date   WBC 5.9 03/12/2021   RBC 4.38 03/12/2021   HGB 13.5 03/12/2021   HCT 39.8 03/12/2021   MCV 90.9 03/12/2021   MCH 30.8 03/12/2021   PLT 152 03/12/2021   MCHC 33.9 03/12/2021   RDW 12.2 03/12/2021   LYMPHSABS 2.1 03/11/2021   MONOABS 0.4 03/11/2021   EOSABS 0.5 03/11/2021   BASOSABS 0.1 60/12/9321     Last metabolic panel Lab Results  Component Value Date   NA 139 03/12/2021   K 3.6 03/12/2021   CL 106 03/12/2021   CO2 26 03/12/2021   BUN 8 03/12/2021   CREATININE 0.74 03/12/2021   GLUCOSE 92 03/12/2021   GFRNONAA >60 03/12/2021   GFRAA 122 08/21/2018   CALCIUM 8.7 (L) 03/12/2021   PROT 6.5 03/12/2021   ALBUMIN 3.6 03/12/2021   LABGLOB 2.1 08/21/2018   AGRATIO 2.1 08/21/2018   BILITOT 2.5 (H) 03/12/2021   ALKPHOS 196 (H) 03/12/2021   AST 244 (H) 03/12/2021   ALT 533 (H) 03/12/2021   ANIONGAP 7 03/12/2021    CBG (last 3)  No results for input(s): GLUCAP in the  last 72 hours.   GFR: Estimated Creatinine Clearance: 103.6 mL/min (by C-G formula based on SCr of 0.74 mg/dL).  Coagulation Profile: Recent Labs  Lab 03/10/21 1211  INR 1.0     Recent Results (from the past 240 hour(s))  Resp Panel by RT-PCR (Flu A&B, Covid) Nasopharyngeal Swab     Status: None   Collection Time: 03/10/21 10:06 AM   Specimen: Nasopharyngeal Swab; Nasopharyngeal(NP) swabs in vial transport medium  Result Value Ref Range Status   SARS Coronavirus 2  by RT PCR NEGATIVE NEGATIVE Final    Comment: (NOTE) SARS-CoV-2 target nucleic acids are NOT DETECTED.  The SARS-CoV-2 RNA is generally detectable in upper respiratory specimens during the acute phase of infection. The lowest concentration of SARS-CoV-2 viral copies this assay can detect is 138 copies/mL. A negative result does not preclude SARS-Cov-2 infection and should not be used as the sole basis for treatment or other patient management decisions. A negative result may occur with  improper specimen collection/handling, submission of specimen other than nasopharyngeal swab, presence of viral mutation(s) within the areas targeted by this assay, and inadequate number of viral copies(<138 copies/mL). A negative result must be combined with clinical observations, patient history, and epidemiological information. The expected result is Negative.  Fact Sheet for Patients:  EntrepreneurPulse.com.au  Fact Sheet for Healthcare Providers:  IncredibleEmployment.be  This test is no t yet approved or cleared by the [place] FDA and  has been authorized for detection and/or diagnosis of SARS-CoV-2 by FDA under an Emergency Use Authorization (EUA). This EUA will remain  in effect (meaning this test can be used) for the duration of the COVID-19 declaration under Section 564(b)(1) of the Act, 21 U.S.C.section 360bbb-3(b)(1), unless the authorization is terminated  or revoked sooner.       Influenza A by PCR NEGATIVE NEGATIVE Final   Influenza B by PCR NEGATIVE NEGATIVE Final    Comment: (NOTE) The Xpert Xpress SARS-CoV-2/FLU/RSV plus assay is intended as an aid in the diagnosis of influenza from Nasopharyngeal swab specimens and should not be used as a sole basis for treatment. Nasal washings and aspirates are unacceptable for Xpert Xpress SARS-CoV-2/FLU/RSV testing.  Fact Sheet for Patients: EntrepreneurPulse.com.au  Fact  Sheet for Healthcare Providers: IncredibleEmployment.be  This test is not yet approved or cleared by the [place] FDA and has been authorized for detection and/or diagnosis of SARS-CoV-2 by FDA under an Emergency Use Authorization (EUA). This EUA will remain in effect (meaning this test can be used) for the duration of the COVID-19 declaration under Section 564(b)(1) of the Act, 21 U.S.C. section 360bbb-3(b)(1), unless the authorization is terminated or revoked.  Performed at [facility], [address] [place], [place] [address]   Culture, blood (routine x 2)     Status: None (Preliminary result)   Collection Time: 03/10/21  1:49 PM   Specimen: BLOOD  Result Value Ref Range Status   Specimen Description BLOOD LEFT ANTECUBITAL  Final   Special Requests   Final    BOTTLES DRAWN AEROBIC ONLY Blood Culture adequate volume   Culture   Final    NO GROWTH 2 DAYS Performed at [facility], [place] [address], [place], [place] [address]    Report Status PENDING  Incomplete  Culture, blood (routine x 2)     Status: None (Preliminary result)   Collection Time: 03/10/21  2:00 PM   Specimen: BLOOD  Result Value Ref Range Status   Specimen Description  Final    BLOOD Blood Culture adequate volume Performed at [facility], [address] [place], [place] [address]    Special Requests   Final    BOTTLES DRAWN AEROBIC AND ANAEROBIC BLOOD LEFT HAND Performed at [facility], [address] [place], [place] [address]    Culture   Final    NO GROWTH 2 DAYS Performed at [facility], [place] [address] [place], [place] [address]    Report Status PENDING  Incomplete         Radiology Studies: MR 3D Recon At Scanner  Result Date: 03/11/2021 CLINICAL DATA:  Choledocholithiasis, acute cholecystitis EXAM: MRI ABDOMEN WITHOUT AND WITH CONTRAST (INCLUDING MRCP) TECHNIQUE: Multiplanar multisequence MR imaging of the abdomen  was performed both before and after the administration of intravenous contrast. Heavily T2-weighted images of the biliary and pancreatic ducts were obtained, and three-dimensional MRCP images were rendered by post processing. CONTRAST:  28mL GADAVIST GADOBUTROL 1 MMOL/ML IV SOLN COMPARISON:  Sonogram 03/10/2021 FINDINGS: Lower chest: Bilateral breast implants are visualized. Cardiac size within normal limits. Hepatobiliary: Normal hepatic parenchymal signal intensity. No focal intrahepatic masses are identified. No intrahepatic biliary ductal dilation. The extrahepatic bile duct is dilated measuring 11 mm in diameter. There are at least 3 intraluminal filling defects identified within the distal common duct in keeping with small stones measuring up to 3 mm in greatest dimension. Multiple layering gallstones are seen within the gallbladder along with a small amount of sludge. The gallbladder is mildly distended. No pericholecystic inflammatory stranding or gallbladder hydrops is identified to suggest superimposed acute cholecystitis. Pancreas: No mass, inflammatory changes, or other parenchymal abnormality identified. No divisum Spleen:  Within normal limits in size and appearance. Adrenals/Urinary Tract: The adrenal glands are unremarkable. The kidneys are normal in size and position. No hydronephrosis. Simple cortical cyst noted within the mid to upper pole the left kidney. Stomach/Bowel: Visualized portions within the abdomen are unremarkable. Vascular/Lymphatic: No pathologically enlarged lymph nodes identified. No abdominal aortic aneurysm demonstrated. Other:  None. Musculoskeletal: No suspicious bone lesions identified. IMPRESSION: Cholelithiasis without superimposed inflammatory change to suggest acute cholecystitis. Choledocholithiasis with at least 3 small calculi measuring up to 3 mm in diameter within the distal common duct. Mild superimposed extrahepatic biliary ductal dilation. Electronically Signed    By: Fidela Salisbury M.D.   On: 03/11/2021 01:00   DG C-Arm 1-60 Min-No Report  Result Date: 03/12/2021 Fluoroscopy was utilized by the requesting physician.  No radiographic interpretation.   MR ABDOMEN MRCP W WO CONTAST  Result Date: 03/11/2021 CLINICAL DATA:  Choledocholithiasis, acute cholecystitis EXAM: MRI ABDOMEN WITHOUT AND WITH CONTRAST (INCLUDING MRCP) TECHNIQUE: Multiplanar multisequence MR imaging of the abdomen was performed both before and after the administration of intravenous contrast. Heavily T2-weighted images of the biliary and pancreatic ducts were obtained, and three-dimensional MRCP images were rendered by post processing. CONTRAST:  44mL GADAVIST GADOBUTROL 1 MMOL/ML IV SOLN COMPARISON:  Sonogram 03/10/2021 FINDINGS: Lower chest: Bilateral breast implants are visualized. Cardiac size within normal limits. Hepatobiliary: Normal hepatic parenchymal signal intensity. No focal intrahepatic masses are identified. No intrahepatic biliary ductal dilation. The extrahepatic bile duct is dilated measuring 11 mm in diameter. There are at least 3 intraluminal filling defects identified within the distal common duct in keeping with small stones measuring up to 3 mm in greatest dimension. Multiple layering gallstones are seen within the gallbladder along with a small amount of sludge. The gallbladder is mildly distended. No  pericholecystic inflammatory stranding or gallbladder hydrops is identified to suggest superimposed acute cholecystitis. Pancreas: No mass, inflammatory changes, or other parenchymal abnormality identified. No divisum Spleen:  Within normal limits in size and appearance. Adrenals/Urinary Tract: The adrenal glands are unremarkable. The kidneys are normal in size and position. No hydronephrosis. Simple cortical cyst noted within the mid to upper pole the left kidney. Stomach/Bowel: Visualized portions within the abdomen are unremarkable. Vascular/Lymphatic: No pathologically  enlarged lymph nodes identified. No abdominal aortic aneurysm demonstrated. Other:  None. Musculoskeletal: No suspicious bone lesions identified. IMPRESSION: Cholelithiasis without superimposed inflammatory change to suggest acute cholecystitis. Choledocholithiasis with at least 3 small calculi measuring up to 3 mm in diameter within the distal common duct. Mild superimposed extrahepatic biliary ductal dilation. Electronically Signed   By: Fidela Salisbury M.D.   On: 03/11/2021 01:00        Scheduled Meds:  Chlorhexidine Gluconate Cloth  6 each Topical Once   indomethacin  100 mg Rectal Once   Continuous Infusions:  sodium chloride Stopped (03/11/21 1732)   sodium chloride      ceFAZolin (ANCEF) IV     lactated ringers 10 mL/hr at 03/12/21 1352   [MAR Hold] piperacillin-tazobactam (ZOSYN)  IV 3.375 g (03/12/21 0744)     LOS: 2 days     Cordelia Poche, MD [facility] 03/12/2021, 4:28 PM  If 7PM-7AM, please contact night-coverage [email or web address]

## 2021-03-12 NOTE — Op Note (Signed)
[facility] Patient Name: Lynn Simpson Procedure Date: 03/12/2021 MRN: [id number] Attending MD: Justice Britain , MD Date of Birth: 14-Oct-1970 CSN: [id number] Age: 50 Admit Type: Inpatient Procedure:                ERCP Indications:              Bile duct stone(s), Jaundice, Elevated liver enzymes Providers:                Justice Britain, MD, Carlyn Reichert, RN, Cherylynn Ridges, Technician, Enrigue Catena, CRNA Referring MD:             [facility], [facility] Medicines:                General Anesthesia, Zosyn 3.375 g IV, Indomethacin                            100 mg PR, Glucagon 9.38 mg IV Complications:            No immediate complications. Estimated Blood Loss:     Estimated blood loss was minimal. Procedure:                Pre-Anesthesia Assessment:                           - Prior to the procedure, a History and Physical                            was performed, and patient medications and                            allergies were reviewed. The patient's tolerance of                            previous anesthesia was also reviewed. The risks                            and benefits of the procedure and the sedation                            options and risks were discussed with the patient.                            All questions were answered, and informed consent                            was obtained. Prior Anticoagulants: The patient has                            taken no previous anticoagulant or antiplatelet                            agents. ASA Grade Assessment: II - A patient with  mild systemic disease. After reviewing the risks                            and benefits, the patient was deemed in                            satisfactory condition to undergo the procedure.                           After obtaining informed consent, the scope was                            passed under direct  vision. Throughout the                            procedure, the patient's blood pressure, pulse, and                            oxygen saturations were monitored continuously. The                            [employer] D single use                            duodenoscope was introduced through the mouth, and                            used to inject contrast into and used to inject                            contrast into the ventral pancreatic duct. The ERCP                            was extremely difficult due to challenging                            cannulation. Successful completion of the procedure                            was aided by performing the maneuvers documented                            (below) in this report. The patient tolerated the                            procedure. The [id number]-[id number] Olympus                            duodenoscope was introduced through the mouth, and                            used to inject contrast into and used to inject  contrast into the ventral pancreatic duct. Scope In: Scope Out: Findings:      The scout film was normal.      The esophagus was successfully intubated under direct vision without       detailed examination of the pharynx, larynx, and associated structures.       A significant J-shaped deformity was found of the entire examined       stomach. The major papilla was adjacent to a diverticulum. The major       papilla was congested and was found significantly under a hod and under       excess mucosa.      The bile duct could not be cannulated with the Hydratome sphincterotome.      Repeated attempts at biliary cannulation were not successful while using       a wire-guided approach. This led to placement of the wire within the       pancreatic duct. Decision was made to pursue a double-wire approach. The       wire was left within the pancreatic duct.      The bile duct  could not be cannulated with the double wire technique.      I tried to move into a long and semi-long position, but this was not       possible, even in the left-lateral position and the standard ERCP       positions.      Superficial cannulation of and contrast injection into the ampulla to       try and delineate the biliary orifice was performed with the       sphincterotome. I personally interpreted the pancreatic duct images.       There was appropriate flow of contrast through the ducts. Image quality       was adequate. Contrast extended to the pancreatic duct. The entire       opacified area was normal and noted to be 1-2 mm in size.      To try and see if I could get improvement by straghtening out the       ampulla, I placed one 4 Fr by 7 cm temporary plastic pancreatic stent       with a single external flap was placed into the ventral pancreatic duct.       Clear fluid flowed through the stent. The stent was in good position.      Using another wire, the bile duct could not be cannulated with the       short-nosed traction sphincterotome. I transitioned to a Revolution       sphincterotome and tried the Revolution straight and angled wires       without success at cannulation but rather followed the pancreatic stent       that had been in place.      After more than an hour of trying, I decided to attempt a biliary       pre-cut sphincterotomy. Slowly over multiple movements, I made a 12 mm       precut sphinterotomy with a monofilament needle knife over the       pancreatic stent and freehand using ERBE electrocautery. There was no       post-sphincterotomy bleeding.      I could see increased amount of bile come, but further sphincterotomy       extension did not allow me to find the biliary tree. The bile duct could  not be cannulated with the Hydratome sphincterotome and Revolution       Jagtome sphincterotomes.      Unfortuantely, biliary cannulation was not  possible today after       significant advanced techniques.      The duodenoscope was withdrawn from the patient. Impression:               - J-shaped deformity of the stomach.                           - The major papilla was adjacent to a diverticulum.                           - The major papilla appeared congested and had a                            hood and excess tissue in place.                           - Only standard ERCP scope position could be used                            to evaluate the ampullary region (semi-long/long                            position did not allow for any visualization of the                            papilla).                           - One temporary plastic pancreatic stent was placed                            into the ventral pancreatic duct to try and aid                            with biliary cannulation and for PEP was placed.                           - A biliary precut sphincterotomy was performed to                            try and gain biliary access.                           - As noted above, unsuccessful at cannulation. Moderate Sedation:      Not Applicable - Patient had care per Anesthesia. Recommendation:           - The patient will be observed post-procedure,                            until all discharge criteria are met.                           -  Return patient to hospital ward for ongoing care.                           - Observe patient's clinical course.                           - Watch for pancreatitis, bleeding, perforation,                            and cholangitis.                           - Would hold chemical VTE prophylaxis for 48 hours                            to decrease risk of post-interventional bleeding.                            If anticoagulation is necessary consider heparin                            drip without bolus in 6-12 hours and monitor                            closely.                            - Check lipase and liver enzymes (AST, ALT,                            alkaline phosphatase, bilirubin) in the morning.                           - Will need to discuss with patient consideration                            of transfer to outside facility for repeat ERCP                            attempt vs repeat ERCP attempt in 2-3 days here vs                            PBD placement by IR to obtain biliary                            decompression. If PBD is pursued, then in 3-4 weeks                            after the PBD has been in place, we can use that as                            a Rendezvous and plan for sphincterotomy extension  and stone removal and removal of the PBD as well.                           - Defer to our surgical collegues if they would                            offer a CCK with IOC in the interim, in the                            offchance that the stones have passed, but with                            elevated LFTs and the MRI/MRCP, not clear that that                            would be the case.                           - Start PPI once daily.                           - Advance diet as tolerated.                           - The findings and recommendations were discussed                            with the patient.                           - The findings and recommendations were discussed                            with the referring physician. Procedure Code(s):        --- Professional ---                           [phone], Endoscopic retrograde                            cholangiopancreatography (ERCP); with placement of                            endoscopic stent into biliary or pancreatic duct,                            including pre- and post-dilation and guide wire                            passage, when performed, including sphincterotomy,                            when performed, each stent Diagnosis  Code(s):        --- Professional ---  K31.89, Other diseases of stomach and duodenum                           K80.50, Calculus of bile duct without cholangitis                            or cholecystitis without obstruction                           R17, Unspecified jaundice                           R74.8, Abnormal levels of other serum enzymes                           K83.8, Other specified diseases of biliary tract CPT copyright 2019 [employer]. All rights reserved. The codes documented in this report are preliminary and upon coder review may  be revised to meet current compliance requirements. Justice Britain, MD 03/12/2021 4:43:57 PM Number of Addenda: 0

## 2021-03-12 NOTE — Transfer of Care (Signed)
Immediate Anesthesia Transfer of Care Note  Patient: Lynn Simpson  Procedure(s) Performed: ENDOSCOPIC RETROGRADE CHOLANGIOPANCREATOGRAPHY (ERCP) SPHINCTEROTOMY PANCREATIC STENT PLACEMENT  Patient Location: PACU  Anesthesia Type:General  Level of Consciousness: awake, alert , oriented and patient cooperative  Airway & Oxygen Therapy: Patient Spontanous Breathing and Patient connected to face mask oxygen  Post-op Assessment: Report given to RN, Post -op Vital signs reviewed and stable and Patient moving all extremities X 4  Post vital signs: stable  Last Vitals:  Vitals Value Taken Time  BP    Temp    Pulse 98 03/12/21 1621  Resp 18 03/12/21 1621  SpO2 92 % 03/12/21 1621  Vitals shown include unvalidated device data.  Last Pain:  Vitals:   03/12/21 1206  TempSrc: Oral  PainSc: 0-No pain         Complications: No notable events documented.

## 2021-03-12 NOTE — Anesthesia Postprocedure Evaluation (Signed)
Anesthesia Post Note  Patient: Lynn Simpson  Procedure(s) Performed: ENDOSCOPIC RETROGRADE CHOLANGIOPANCREATOGRAPHY (ERCP) [place]     Patient location during evaluation: Endoscopy Anesthesia Type: General Level of consciousness: sedated Pain management: pain level controlled Vital Signs Assessment: post-procedure vital signs reviewed and stable Respiratory status: spontaneous breathing and respiratory function stable Cardiovascular status: stable Postop Assessment: no apparent nausea or vomiting Anesthetic complications: no   No notable events documented.  Last Vitals:  Vitals:   03/12/21 1650 03/12/21 1704  BP: 120/65 (!) 154/66  Pulse: 88 89  Resp: 17 (!) 23  Temp:    SpO2: 90% 96%    Last Pain:  Vitals:   03/12/21 1704  TempSrc:   PainSc: 0-No pain                 Jannine Abreu DANIEL

## 2021-03-12 NOTE — Progress Notes (Signed)
[facility] Progress Note  CC:   Biliary obstruction    Subjective: She feels well this morning. No N/V or abdominal pain. She passed a gray colored stool yesterday evening. Urine was a little lighter last night then darker again this morning. No CP or SOB. She remains NPO for ERCP this afternoon.   Objective:  Vital signs in last 24 hours: Temp:  [97.8 F (36.6 C)-98.1 F (36.7 C)] 97.8 F (36.6 C) (12/23 0552) Pulse Rate:  [78-88] 78 (12/23 0552) Resp:  [18] 18 (12/22 1900) BP: (110-141)/(74-95) 125/90 (12/23 0552) SpO2:  [95 %-96 %] 95 % (12/23 0552) Last BM Date: 03/10/21 General:   Alert in NAD. Eyes: No scleral icterus.  Heart: RRR, no murmur.  Pulm: Breath sounds clear throughout.  Abdomen: Soft, nondistended. Nontender. + BS x 4 quads.  Extremities:  Without edema. Neurologic:  Alert and  oriented x4. Grossly normal neurologically. Psych:  Alert and cooperative. Normal mood and affect. Skin: No jaundice  Intake/Output from previous day: 12/22 0701 - 12/23 0700 In: 946.3 [P.O.:240; I.V.:556.2; IV Piggyback:150] Out: -  Intake/Output this shift: No intake/output data recorded.  Lab Results: Recent Labs    03/10/21 1021 03/11/21 0554 03/12/21 0540  WBC 6.2 6.3 5.9  HGB 15.3* 14.4 13.5  HCT 45.3 42.8 39.8  PLT 180 166 152   BMET Recent Labs    03/10/21 1021 03/11/21 0554 03/12/21 0540  NA 137 139 139  K 3.6 3.3* 3.6  CL 101 105 106  CO2 27 24 26   GLUCOSE 99 75 92  BUN 12 11 8   CREATININE 0.52 0.67 0.74  CALCIUM 8.9 8.7* 8.7*   LFT Recent Labs    03/12/21 0540  PROT 6.5  ALBUMIN 3.6  AST 244*  ALT 533*  ALKPHOS 196*  BILITOT 2.5*  BILIDIR 1.4*  IBILI 1.1*   PT/INR Recent Labs    03/10/21 1211  LABPROT 13.2  INR 1.0   Hepatitis Panel Recent Labs    03/10/21 1211  HEPBSAG NON REACTIVE  HCVAB NON REACTIVE  HEPAIGM NON REACTIVE  HEPBIGM NON REACTIVE    MR 3D Recon At Scanner  Result Date:  03/11/2021 CLINICAL DATA:  Choledocholithiasis, acute cholecystitis EXAM: MRI ABDOMEN WITHOUT AND WITH CONTRAST (INCLUDING MRCP) TECHNIQUE: Multiplanar multisequence MR imaging of the abdomen was performed both before and after the administration of intravenous contrast. Heavily T2-weighted images of the biliary and pancreatic ducts were obtained, and three-dimensional MRCP images were rendered by post processing. CONTRAST:  1mL GADAVIST GADOBUTROL 1 MMOL/ML IV SOLN COMPARISON:  Sonogram 03/10/2021 FINDINGS: Lower chest: Bilateral breast implants are visualized. Cardiac size within normal limits. Hepatobiliary: Normal hepatic parenchymal signal intensity. No focal intrahepatic masses are identified. No intrahepatic biliary ductal dilation. The extrahepatic bile duct is dilated measuring 11 mm in diameter. There are at least 3 intraluminal filling defects identified within the distal common duct in keeping with small stones measuring up to 3 mm in greatest dimension. Multiple layering gallstones are seen within the gallbladder along with a small amount of sludge. The gallbladder is mildly distended. No pericholecystic inflammatory stranding or gallbladder hydrops is identified to suggest superimposed acute cholecystitis. Pancreas: No mass, inflammatory changes, or other parenchymal abnormality identified. No divisum Spleen:  Within normal limits in size and appearance. Adrenals/Urinary Tract: The adrenal glands are unremarkable. The kidneys are normal in size and position. No hydronephrosis. Simple cortical cyst noted within the mid to upper pole the left kidney. Stomach/Bowel: Visualized portions within  the abdomen are unremarkable. Vascular/Lymphatic: No pathologically enlarged lymph nodes identified. No abdominal aortic aneurysm demonstrated. Other:  None. Musculoskeletal: No suspicious bone lesions identified. IMPRESSION: Cholelithiasis without superimposed inflammatory change to suggest acute cholecystitis.  Choledocholithiasis with at least 3 small calculi measuring up to 3 mm in diameter within the distal common duct. Mild superimposed extrahepatic biliary ductal dilation. Electronically Signed   By: Fidela Salisbury M.D.   On: 03/11/2021 01:00   MR ABDOMEN MRCP W WO CONTAST  Result Date: 03/11/2021 CLINICAL DATA:  Choledocholithiasis, acute cholecystitis EXAM: MRI ABDOMEN WITHOUT AND WITH CONTRAST (INCLUDING MRCP) TECHNIQUE: Multiplanar multisequence MR imaging of the abdomen was performed both before and after the administration of intravenous contrast. Heavily T2-weighted images of the biliary and pancreatic ducts were obtained, and three-dimensional MRCP images were rendered by post processing. CONTRAST:  35mL GADAVIST GADOBUTROL 1 MMOL/ML IV SOLN COMPARISON:  Sonogram 03/10/2021 FINDINGS: Lower chest: Bilateral breast implants are visualized. Cardiac size within normal limits. Hepatobiliary: Normal hepatic parenchymal signal intensity. No focal intrahepatic masses are identified. No intrahepatic biliary ductal dilation. The extrahepatic bile duct is dilated measuring 11 mm in diameter. There are at least 3 intraluminal filling defects identified within the distal common duct in keeping with small stones measuring up to 3 mm in greatest dimension. Multiple layering gallstones are seen within the gallbladder along with a small amount of sludge. The gallbladder is mildly distended. No pericholecystic inflammatory stranding or gallbladder hydrops is identified to suggest superimposed acute cholecystitis. Pancreas: No mass, inflammatory changes, or other parenchymal abnormality identified. No divisum Spleen:  Within normal limits in size and appearance. Adrenals/Urinary Tract: The adrenal glands are unremarkable. The kidneys are normal in size and position. No hydronephrosis. Simple cortical cyst noted within the mid to upper pole the left kidney. Stomach/Bowel: Visualized portions within the abdomen are  unremarkable. Vascular/Lymphatic: No pathologically enlarged lymph nodes identified. No abdominal aortic aneurysm demonstrated. Other:  None. Musculoskeletal: No suspicious bone lesions identified. IMPRESSION: Cholelithiasis without superimposed inflammatory change to suggest acute cholecystitis. Choledocholithiasis with at least 3 small calculi measuring up to 3 mm in diameter within the distal common duct. Mild superimposed extrahepatic biliary ductal dilation. Electronically Signed   By: Fidela Salisbury M.D.   On: 03/11/2021 01:00   US Abdomen Limited RUQ (LIVER/GB)  Result Date: 03/10/2021 CLINICAL DATA:  Right upper quadrant pain EXAM: ULTRASOUND ABDOMEN LIMITED RIGHT UPPER QUADRANT COMPARISON:  Abdominal ultrasound 07/28/2017 FINDINGS: Gallbladder: A few dependent echogenic shadowing calculi measuring up to 1 cm in size. No significant wall thickening or pericholecystic fluid visualized. Positive sonographic Murphy's sign per the technologist. Common bile duct: Diameter: 10 mm Liver: Coarse, increased echogenicity of the parenchyma with no focal mass identified. Portal vein is patent on color Doppler imaging with normal direction of blood flow towards the liver. Other: None. IMPRESSION: 1. Cholelithiasis with positive sonographic Murphy's sign per the technologist, suggesting acute calculus cholecystitis. 2. Dilated common bile duct, correlate for evidence of obstruction and consider MRCP if indicated. 3. Evidence of hepatic steatosis. Electronically Signed   By: Ofilia Neas M.D.   On: 03/10/2021 12:11    Assessment / Plan:  64) 50 year old female with a 2 month history of recurrent N/V and LUQ pain admitted to the hospital 03/10/2021 with worsening N/V, LUQ and RUQ pain with the development of dark colored urine and acholic stools x 2 days. Labs in the ED consistent with acute cholestasis concerning for biliary obstruction. RUQ sonogram identified gallstones with a dilated CBD  choledocholithiasis/biliary obstruction. She was noted to have a positive sonographic Murphy sign concerning for acute cholecystitis.  Abdominal MRI/MRCP showed cholelithiasis without acute cholecystitis and choledocholithiasis with at least 3 small calculi in the distal common bile duct with mild superimposed extrahepatic biliary ductal dilatation. Stable LFTs over night. No further N/V or upper abdominal pain. She passed one gray colored stool last night. Urine is darker this morning. Seen by general surgery, planning on lap chole tomorrow. Hemodynamically stable.  -Continue IV Zosyn  -Continue IV fluids  -NPO -Proceed with ERCP as scheduled this afternoon with Dr. Rush Landmark    2) History of Lynch Syndrome -Surveillance colonoscopy scheduled as an outpatient on 04/12/2021  Principal Problem:   Choledocholithiasis with acute cholecystitis Active Problems:   Elevated LFTs     LOS: 2 days   Noralyn Pick  03/12/2021, 8:51 AM

## 2021-03-12 NOTE — Interval H&P Note (Signed)
History and Physical Interval Note:  03/12/2021 1:10 PM  [name]  has presented today for surgery, with the diagnosis of choledocholithiasis.  The various methods of treatment have been discussed with the patient and family. After consideration of risks, benefits and other options for treatment, the patient has consented to  Procedure(s): ENDOSCOPIC RETROGRADE CHOLANGIOPANCREATOGRAPHY (ERCP) (N/A) as a surgical intervention.  The patient's history has been reviewed, patient examined, no change in status, stable for surgery.  I have reviewed the patient's chart and labs.  Questions were answered to the patient's satisfaction.     The risks of an ERCP were discussed at length, including but not limited to the risk of perforation, bleeding, abdominal pain, post-ERCP pancreatitis (while usually mild can be severe and even life threatening).    [employer]

## 2021-03-12 NOTE — Anesthesia Procedure Notes (Signed)
Procedure Name: Intubation Date/Time: 03/12/2021 1:58 PM Performed by: Lissa Morales, CRNA Pre-anesthesia Checklist: Patient identified, Emergency Drugs available, Suction available and Patient being monitored Patient Re-evaluated:Patient Re-evaluated prior to induction Oxygen Delivery Method: Circle system utilized Preoxygenation: Pre-oxygenation with 100% oxygen Induction Type: IV induction Ventilation: Mask ventilation without difficulty Grade View: Grade II Tube type: Oral Tube size: 7.5 mm Number of attempts: 1 Airway Equipment and Method: Stylet and Oral airway Placement Confirmation: ETT inserted through vocal cords under direct vision, positive ETCO2 and breath sounds checked- equal and bilateral Secured at: 22 cm Tube secured with: Tape Dental Injury: Teeth and Oropharynx as per pre-operative assessment

## 2021-03-12 NOTE — Discharge Instructions (Signed)
[facility] LAPAROSCOPIC SURGERY: POST OP INSTRUCTIONS Always review your discharge instruction sheet given to you by the facility where your surgery was performed. IF YOU HAVE DISABILITY OR FAMILY LEAVE FORMS, YOU MUST BRING THEM TO THE OFFICE FOR PROCESSING.   DO NOT GIVE THEM TO YOUR DOCTOR.  PAIN CONTROL  First take acetaminophen (Tylenol) AND/or ibuprofen (Advil) to control your pain after surgery.  Follow directions on package.  Taking acetaminophen (Tylenol) and/or ibuprofen (Advil) regularly after surgery will help to control your pain and lower the amount of prescription pain medication you may need.  You should not take more than 3,000 mg (3 grams) of acetaminophen (Tylenol) in 24 hours.  You should not take ibuprofen (Advil), aleve, motrin, naprosyn or other NSAIDS if you have a history of stomach ulcers or chronic kidney disease.  A prescription for pain medication may be given to you upon discharge.  Take your pain medication as prescribed, if you still have uncontrolled pain after taking acetaminophen (Tylenol) or ibuprofen (Advil). Use ice packs to help control pain. If you need a refill on your pain medication, please contact your pharmacy.  They will contact our office to request authorization. Prescriptions will not be filled after 5pm or on week-ends.  HOME MEDICATIONS Take your usually prescribed medications unless otherwise directed.  DIET You should follow a light diet the first few days after arrival home.  Be sure to include lots of fluids daily. Avoid fatty, fried foods.   CONSTIPATION It is common to experience some constipation after surgery and if you are taking pain medication.  Increasing fluid intake and taking a stool softener (such as Colace) will usually help or prevent this problem from occurring.  A mild laxative (Milk of Magnesia or Miralax) should be taken according to package instructions if there are no bowel movements after 48  hours.  WOUND/INCISION CARE Most patients will experience some swelling and bruising in the area of the incisions.  Ice packs will help.  Swelling and bruising can take several days to resolve.  Unless discharge instructions indicate otherwise, follow guidelines below  STERI-STRIPS - you may remove your outer bandages 48 hours after surgery, and you may shower at that time.  You have steri-strips (small skin tapes) in place directly over the incision.  These strips should be left on the skin for 7-10 days.   DERMABOND/SKIN GLUE - you may shower in 24 hours.  The glue will flake off over the next 2-3 weeks. Any sutures or staples will be removed at the office during your follow-up visit.  ACTIVITIES You may resume regular (light) daily activities beginning the next day--such as daily self-care, walking, climbing stairs--gradually increasing activities as tolerated.  You may have sexual intercourse when it is comfortable.  Refrain from any heavy lifting or straining until approved by your doctor. You may drive when you are no longer taking prescription pain medication, you can comfortably wear a seatbelt, and you can safely maneuver your car and apply brakes.  FOLLOW-UP You should see your doctor in the office for a follow-up appointment approximately 2-3 weeks after your surgery.  You should have been given your post-op/follow-up appointment when your surgery was scheduled.  If you did not receive a post-op/follow-up appointment, make sure that you call for this appointment within a day or two after you arrive home to insure a convenient appointment time.   WHEN TO CALL YOUR DOCTOR: Fever over 101.0 Inability to urinate Continued bleeding from incision.   Increased pain, redness, or drainage from the incision. Increasing abdominal pain  The clinic staff is available to answer your questions during regular business hours.  Please don't hesitate to call and ask to speak to one of the nurses for  clinical concerns.  If you have a medical emergency, go to the nearest emergency room or call 911.  A surgeon from [facility] is always on call at the hospital. [address], [address], [place], [place]  [address] ? [address] [address], [place], [place]   [address] [phone] ? [phone] ? FAX [phone] Web site: [email or web address]  

## 2021-03-12 NOTE — H&P (View-Only) (Signed)
Subjective/Chief Complaint: FEELS OK FOR ERCP TODAY    Objective: Vital signs in last 24 hours: Temp:  [97.8 F (36.6 C)-98.1 F (36.7 C)] 97.8 F (36.6 C) (12/23 0552) Pulse Rate:  [78-88] 78 (12/23 0552) Resp:  [18] 18 (12/22 1900) BP: (110-141)/(74-95) 125/90 (12/23 0552) SpO2:  [95 %-96 %] 95 % (12/23 0552) Last BM Date: 03/10/21  Intake/Output from previous day: 12/22 0701 - 12/23 0700 In: 946.3 [P.O.:240; I.V.:556.2; IV Piggyback:150] Out: -  Intake/Output this shift: No intake/output data recorded.  General appearance: alert and cooperative Resp: clear to auscultation bilaterally Cardio: regular rate and rhythm GI: soft, non-tender; bowel sounds normal; no masses,  no organomegaly Neurologic: Grossly normal  Lab Results:  Recent Labs    03/11/21 0554 03/12/21 0540  WBC 6.3 5.9  HGB 14.4 13.5  HCT 42.8 39.8  PLT 166 152   BMET Recent Labs    03/11/21 0554 03/12/21 0540  NA 139 139  K 3.3* 3.6  CL 105 106  CO2 24 26  GLUCOSE 75 92  BUN 11 8  CREATININE 0.67 0.74  CALCIUM 8.7* 8.7*   PT/INR Recent Labs    03/10/21 1211  LABPROT 13.2  INR 1.0   ABG No results for input(s): PHART, HCO3 in the last 72 hours.  Invalid input(s): PCO2, PO2  Studies/Results: MR 3D Recon At Scanner  Result Date: 03/11/2021 CLINICAL DATA:  Choledocholithiasis, acute cholecystitis EXAM: MRI ABDOMEN WITHOUT AND WITH CONTRAST (INCLUDING MRCP) TECHNIQUE: Multiplanar multisequence MR imaging of the abdomen was performed both before and after the administration of intravenous contrast. Heavily T2-weighted images of the biliary and pancreatic ducts were obtained, and three-dimensional MRCP images were rendered by post processing. CONTRAST:  32mL GADAVIST GADOBUTROL 1 MMOL/ML IV SOLN COMPARISON:  Sonogram 03/10/2021 FINDINGS: Lower chest: Bilateral breast implants are visualized. Cardiac size within normal limits. Hepatobiliary: Normal hepatic parenchymal signal  intensity. No focal intrahepatic masses are identified. No intrahepatic biliary ductal dilation. The extrahepatic bile duct is dilated measuring 11 mm in diameter. There are at least 3 intraluminal filling defects identified within the distal common duct in keeping with small stones measuring up to 3 mm in greatest dimension. Multiple layering gallstones are seen within the gallbladder along with a small amount of sludge. The gallbladder is mildly distended. No pericholecystic inflammatory stranding or gallbladder hydrops is identified to suggest superimposed acute cholecystitis. Pancreas: No mass, inflammatory changes, or other parenchymal abnormality identified. No divisum Spleen:  Within normal limits in size and appearance. Adrenals/Urinary Tract: The adrenal glands are unremarkable. The kidneys are normal in size and position. No hydronephrosis. Simple cortical cyst noted within the mid to upper pole the left kidney. Stomach/Bowel: Visualized portions within the abdomen are unremarkable. Vascular/Lymphatic: No pathologically enlarged lymph nodes identified. No abdominal aortic aneurysm demonstrated. Other:  None. Musculoskeletal: No suspicious bone lesions identified. IMPRESSION: Cholelithiasis without superimposed inflammatory change to suggest acute cholecystitis. Choledocholithiasis with at least 3 small calculi measuring up to 3 mm in diameter within the distal common duct. Mild superimposed extrahepatic biliary ductal dilation. Electronically Signed   By: Fidela Salisbury M.D.   On: 03/11/2021 01:00   MR ABDOMEN MRCP W WO CONTAST  Result Date: 03/11/2021 CLINICAL DATA:  Choledocholithiasis, acute cholecystitis EXAM: MRI ABDOMEN WITHOUT AND WITH CONTRAST (INCLUDING MRCP) TECHNIQUE: Multiplanar multisequence MR imaging of the abdomen was performed both before and after the administration of intravenous contrast. Heavily T2-weighted images of the biliary and pancreatic ducts were obtained, and  three-dimensional MRCP images  were rendered by post processing. CONTRAST:  3mL GADAVIST GADOBUTROL 1 MMOL/ML IV SOLN COMPARISON:  Sonogram 03/10/2021 FINDINGS: Lower chest: Bilateral breast implants are visualized. Cardiac size within normal limits. Hepatobiliary: Normal hepatic parenchymal signal intensity. No focal intrahepatic masses are identified. No intrahepatic biliary ductal dilation. The extrahepatic bile duct is dilated measuring 11 mm in diameter. There are at least 3 intraluminal filling defects identified within the distal common duct in keeping with small stones measuring up to 3 mm in greatest dimension. Multiple layering gallstones are seen within the gallbladder along with a small amount of sludge. The gallbladder is mildly distended. No pericholecystic inflammatory stranding or gallbladder hydrops is identified to suggest superimposed acute cholecystitis. Pancreas: No mass, inflammatory changes, or other parenchymal abnormality identified. No divisum Spleen:  Within normal limits in size and appearance. Adrenals/Urinary Tract: The adrenal glands are unremarkable. The kidneys are normal in size and position. No hydronephrosis. Simple cortical cyst noted within the mid to upper pole the left kidney. Stomach/Bowel: Visualized portions within the abdomen are unremarkable. Vascular/Lymphatic: No pathologically enlarged lymph nodes identified. No abdominal aortic aneurysm demonstrated. Other:  None. Musculoskeletal: No suspicious bone lesions identified. IMPRESSION: Cholelithiasis without superimposed inflammatory change to suggest acute cholecystitis. Choledocholithiasis with at least 3 small calculi measuring up to 3 mm in diameter within the distal common duct. Mild superimposed extrahepatic biliary ductal dilation. Electronically Signed   By: Fidela Salisbury M.D.   On: 03/11/2021 01:00   US Abdomen Limited RUQ (LIVER/GB)  Result Date: 03/10/2021 CLINICAL DATA:  Right upper quadrant pain EXAM:  ULTRASOUND ABDOMEN LIMITED RIGHT UPPER QUADRANT COMPARISON:  Abdominal ultrasound 07/28/2017 FINDINGS: Gallbladder: A few dependent echogenic shadowing calculi measuring up to 1 cm in size. No significant wall thickening or pericholecystic fluid visualized. Positive sonographic Murphy's sign per the technologist. Common bile duct: Diameter: 10 mm Liver: Coarse, increased echogenicity of the parenchyma with no focal mass identified. Portal vein is patent on color Doppler imaging with normal direction of blood flow towards the liver. Other: None. IMPRESSION: 1. Cholelithiasis with positive sonographic Murphy's sign per the technologist, suggesting acute calculus cholecystitis. 2. Dilated common bile duct, correlate for evidence of obstruction and consider MRCP if indicated. 3. Evidence of hepatic steatosis. Electronically Signed   By: Ofilia Neas M.D.   On: 03/10/2021 12:11    Anti-infectives: Anti-infectives (From admission, onward)    Start     Dose/Rate Route Frequency Ordered Stop   03/11/21 0000  piperacillin-tazobactam (ZOSYN) IVPB 3.375 g        3.375 g 12.5 mL/hr over 240 Minutes Intravenous Every 8 hours 03/10/21 2336     03/10/21 1345  piperacillin-tazobactam (ZOSYN) IVPB 3.375 g        3.375 g 100 mL/hr over 30 Minutes Intravenous  Once 03/10/21 1330 03/10/21 1635       Assessment/Plan:  Choledocholithiasis - Patient is scheduled for ERCP tomorrow 03/12/21. Barring no complications from this we will tentatively plan for lap chole on 03/13/21. We will follow.   The procedure has been discussed with the patient. Operative and non operative treatments have been discussed. Risks of surgery include bleeding, infection,  Common bile duct injury,  Injury to the stomach,liver, colon,small intestine, abdominal wall,  Diaphragm,  Major blood vessels,  And the need for an open procedure.  Other risks include worsening of medical problems, death,  DVT and pulmonary embolism, and  cardiovascular events.   Medical options have also been discussed. The patient has been informed of long term  expectations of surgery and non surgical options,  The patient agrees to proceed.    NPO after midnight    ID - zosyn 12/21>> VTE - SCDs, ok for chemical dvt prophylaxis from surgical standpoint FEN - IVF, CLD, NPO after MN Foley - none   Asthma Lynch syndrome Obesity BMI 46.64  LOS: 2 days    Marcello Moores A Ilham Roughton   03/12/2021

## 2021-03-12 NOTE — Progress Notes (Signed)
Subjective/Chief Complaint: FEELS OK FOR ERCP TODAY    Objective: Vital signs in last 24 hours: Temp:  [97.8 F (36.6 C)-98.1 F (36.7 C)] 97.8 F (36.6 C) (12/23 0552) Pulse Rate:  [78-88] 78 (12/23 0552) Resp:  [18] 18 (12/22 1900) BP: (110-141)/(74-95) 125/90 (12/23 0552) SpO2:  [95 %-96 %] 95 % (12/23 0552) Last BM Date: 03/10/21  Intake/Output from previous day: 12/22 0701 - 12/23 0700 In: 946.3 [P.O.:240; I.V.:556.2; IV Piggyback:150] Out: -  Intake/Output this shift: No intake/output data recorded.  General appearance: alert and cooperative Resp: clear to auscultation bilaterally Cardio: regular rate and rhythm GI: soft, non-tender; bowel sounds normal; no masses,  no organomegaly Neurologic: Grossly normal  Lab Results:  Recent Labs    03/11/21 0554 03/12/21 0540  WBC 6.3 5.9  HGB 14.4 13.5  HCT 42.8 39.8  PLT 166 152   BMET Recent Labs    03/11/21 0554 03/12/21 0540  NA 139 139  K 3.3* 3.6  CL 105 106  CO2 24 26  GLUCOSE 75 92  BUN 11 8  CREATININE 0.67 0.74  CALCIUM 8.7* 8.7*   PT/INR Recent Labs    03/10/21 1211  LABPROT 13.2  INR 1.0   ABG No results for input(s): PHART, HCO3 in the last 72 hours.  Invalid input(s): PCO2, PO2  Studies/Results: MR 3D Recon At Scanner  Result Date: 03/11/2021 CLINICAL DATA:  Choledocholithiasis, acute cholecystitis EXAM: MRI ABDOMEN WITHOUT AND WITH CONTRAST (INCLUDING MRCP) TECHNIQUE: Multiplanar multisequence MR imaging of the abdomen was performed both before and after the administration of intravenous contrast. Heavily T2-weighted images of the biliary and pancreatic ducts were obtained, and three-dimensional MRCP images were rendered by post processing. CONTRAST:  89mL GADAVIST GADOBUTROL 1 MMOL/ML IV SOLN COMPARISON:  Sonogram 03/10/2021 FINDINGS: Lower chest: Bilateral breast implants are visualized. Cardiac size within normal limits. Hepatobiliary: Normal hepatic parenchymal signal  intensity. No focal intrahepatic masses are identified. No intrahepatic biliary ductal dilation. The extrahepatic bile duct is dilated measuring 11 mm in diameter. There are at least 3 intraluminal filling defects identified within the distal common duct in keeping with small stones measuring up to 3 mm in greatest dimension. Multiple layering gallstones are seen within the gallbladder along with a small amount of sludge. The gallbladder is mildly distended. No pericholecystic inflammatory stranding or gallbladder hydrops is identified to suggest superimposed acute cholecystitis. Pancreas: No mass, inflammatory changes, or other parenchymal abnormality identified. No divisum Spleen:  Within normal limits in size and appearance. Adrenals/Urinary Tract: The adrenal glands are unremarkable. The kidneys are normal in size and position. No hydronephrosis. Simple cortical cyst noted within the mid to upper pole the left kidney. Stomach/Bowel: Visualized portions within the abdomen are unremarkable. Vascular/Lymphatic: No pathologically enlarged lymph nodes identified. No abdominal aortic aneurysm demonstrated. Other:  None. Musculoskeletal: No suspicious bone lesions identified. IMPRESSION: Cholelithiasis without superimposed inflammatory change to suggest acute cholecystitis. Choledocholithiasis with at least 3 small calculi measuring up to 3 mm in diameter within the distal common duct. Mild superimposed extrahepatic biliary ductal dilation. Electronically Signed   By: Fidela Salisbury M.D.   On: 03/11/2021 01:00   MR ABDOMEN MRCP W WO CONTAST  Result Date: 03/11/2021 CLINICAL DATA:  Choledocholithiasis, acute cholecystitis EXAM: MRI ABDOMEN WITHOUT AND WITH CONTRAST (INCLUDING MRCP) TECHNIQUE: Multiplanar multisequence MR imaging of the abdomen was performed both before and after the administration of intravenous contrast. Heavily T2-weighted images of the biliary and pancreatic ducts were obtained, and  three-dimensional MRCP images  were rendered by post processing. CONTRAST:  90mL GADAVIST GADOBUTROL 1 MMOL/ML IV SOLN COMPARISON:  Sonogram 03/10/2021 FINDINGS: Lower chest: Bilateral breast implants are visualized. Cardiac size within normal limits. Hepatobiliary: Normal hepatic parenchymal signal intensity. No focal intrahepatic masses are identified. No intrahepatic biliary ductal dilation. The extrahepatic bile duct is dilated measuring 11 mm in diameter. There are at least 3 intraluminal filling defects identified within the distal common duct in keeping with small stones measuring up to 3 mm in greatest dimension. Multiple layering gallstones are seen within the gallbladder along with a small amount of sludge. The gallbladder is mildly distended. No pericholecystic inflammatory stranding or gallbladder hydrops is identified to suggest superimposed acute cholecystitis. Pancreas: No mass, inflammatory changes, or other parenchymal abnormality identified. No divisum Spleen:  Within normal limits in size and appearance. Adrenals/Urinary Tract: The adrenal glands are unremarkable. The kidneys are normal in size and position. No hydronephrosis. Simple cortical cyst noted within the mid to upper pole the left kidney. Stomach/Bowel: Visualized portions within the abdomen are unremarkable. Vascular/Lymphatic: No pathologically enlarged lymph nodes identified. No abdominal aortic aneurysm demonstrated. Other:  None. Musculoskeletal: No suspicious bone lesions identified. IMPRESSION: Cholelithiasis without superimposed inflammatory change to suggest acute cholecystitis. Choledocholithiasis with at least 3 small calculi measuring up to 3 mm in diameter within the distal common duct. Mild superimposed extrahepatic biliary ductal dilation. Electronically Signed   By: Fidela Salisbury M.D.   On: 03/11/2021 01:00   US Abdomen Limited RUQ (LIVER/GB)  Result Date: 03/10/2021 CLINICAL DATA:  Right upper quadrant pain EXAM:  ULTRASOUND ABDOMEN LIMITED RIGHT UPPER QUADRANT COMPARISON:  Abdominal ultrasound 07/28/2017 FINDINGS: Gallbladder: A few dependent echogenic shadowing calculi measuring up to 1 cm in size. No significant wall thickening or pericholecystic fluid visualized. Positive sonographic Murphy's sign per the technologist. Common bile duct: Diameter: 10 mm Liver: Coarse, increased echogenicity of the parenchyma with no focal mass identified. Portal vein is patent on color Doppler imaging with normal direction of blood flow towards the liver. Other: None. IMPRESSION: 1. Cholelithiasis with positive sonographic Murphy's sign per the technologist, suggesting acute calculus cholecystitis. 2. Dilated common bile duct, correlate for evidence of obstruction and consider MRCP if indicated. 3. Evidence of hepatic steatosis. Electronically Signed   By: Ofilia Neas M.D.   On: 03/10/2021 12:11    Anti-infectives: Anti-infectives (From admission, onward)    Start     Dose/Rate Route Frequency Ordered Stop   03/11/21 0000  piperacillin-tazobactam (ZOSYN) IVPB 3.375 g        3.375 g 12.5 mL/hr over 240 Minutes Intravenous Every 8 hours 03/10/21 2336     03/10/21 1345  piperacillin-tazobactam (ZOSYN) IVPB 3.375 g        3.375 g 100 mL/hr over 30 Minutes Intravenous  Once 03/10/21 1330 03/10/21 1635       Assessment/Plan:  Choledocholithiasis - Patient is scheduled for ERCP tomorrow 03/12/21. Barring no complications from this we will tentatively plan for lap chole on 03/13/21. We will follow.   The procedure has been discussed with the patient. Operative and non operative treatments have been discussed. Risks of surgery include bleeding, infection,  Common bile duct injury,  Injury to the stomach,liver, colon,small intestine, abdominal wall,  Diaphragm,  Major blood vessels,  And the need for an open procedure.  Other risks include worsening of medical problems, death,  DVT and pulmonary embolism, and  cardiovascular events.   Medical options have also been discussed. The patient has been informed of long term  expectations of surgery and non surgical options,  The patient agrees to proceed.    NPO after midnight    ID - zosyn 12/21>> VTE - SCDs, ok for chemical dvt prophylaxis from surgical standpoint FEN - IVF, CLD, NPO after MN Foley - none   Asthma Lynch syndrome Obesity BMI 46.64  LOS: 2 days    Marcello Moores A Gizella [place]ville   03/12/2021

## 2021-03-13 ENCOUNTER — Encounter (HOSPITAL_COMMUNITY)
Admission: EM | Disposition: A | Discharge status: Other Acute Inpatient Hospital | Payer: Self-pay | Source: Home / Self Care | Attending: Family Medicine

## 2021-03-13 ENCOUNTER — Inpatient Hospital Stay (HOSPITAL_COMMUNITY): Payer: BC Managed Care – PPO

## 2021-03-13 ENCOUNTER — Inpatient Hospital Stay (HOSPITAL_COMMUNITY): Payer: BC Managed Care – PPO | Admitting: Certified Registered Nurse Anesthetist

## 2021-03-13 DIAGNOSIS — K8042 Calculus of bile duct with acute cholecystitis without obstruction: Secondary | ICD-10-CM | POA: Diagnosis not present

## 2021-03-13 HISTORY — PX: CHOLECYSTECTOMY: SHX55

## 2021-03-13 LAB — COMPREHENSIVE METABOLIC PANEL
ALT: 518 U/L — ABNORMAL HIGH (ref 0–44)
AST: 272 U/L — ABNORMAL HIGH (ref 15–41)
Albumin: 3.9 g/dL (ref 3.5–5.0)
Alkaline Phosphatase: 248 U/L — ABNORMAL HIGH (ref 38–126)
Anion gap: 9 (ref 5–15)
BUN: 8 mg/dL (ref 6–20)
CO2: 25 mmol/L (ref 22–32)
Calcium: 9.4 mg/dL (ref 8.9–10.3)
Chloride: 104 mmol/L (ref 98–111)
Creatinine, Ser: 0.58 mg/dL (ref 0.44–1.00)
GFR, Estimated: >60 mL/min (ref >60)
Glucose, Bld: 109 mg/dL — ABNORMAL HIGH (ref 70–99)
Potassium: 4 mmol/L (ref 3.5–5.1)
Sodium: 138 mmol/L (ref 135–145)
Total Bilirubin: 2.7 mg/dL — ABNORMAL HIGH (ref 0.3–1.2)
Total Protein: 7.4 g/dL (ref 6.5–8.1)

## 2021-03-13 IMAGING — RF DG CHOLANGIOGRAM OPERATIVE
1 series · 12 of 12 positions shown · non-contrast
Comparison: [DATE]

CLINICAL DATA: Acute calculus cholecystitis

EXAM:
INTRAOPERATIVE CHOLANGIOGRAM
TECHNIQUE: Cholangiographic images from the C-arm fluoroscopic device were
submitted for interpretation post-operatively. Please see the
procedural report for the amount of contrast and the fluoroscopy
time utilized.

[Series 1: run · 3 acquisitions, 12 frames shown]
[im 1/3]
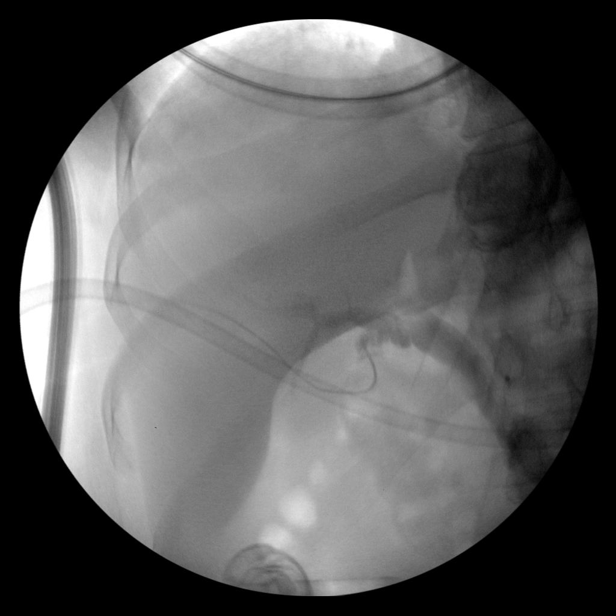
[im 1/3]
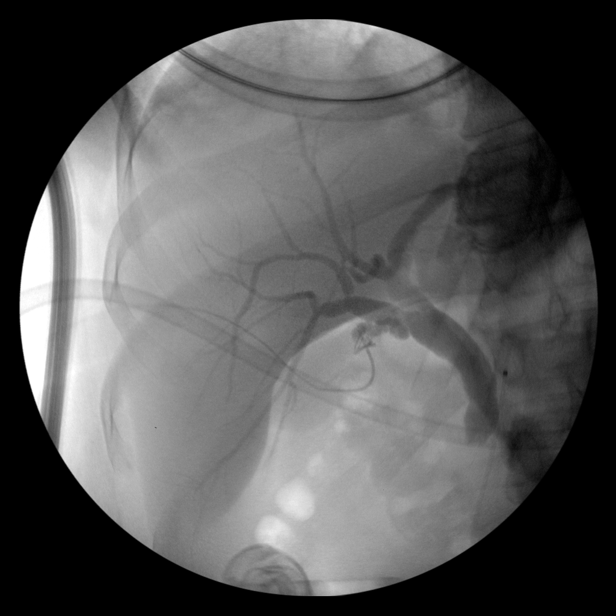
[im 1/3]
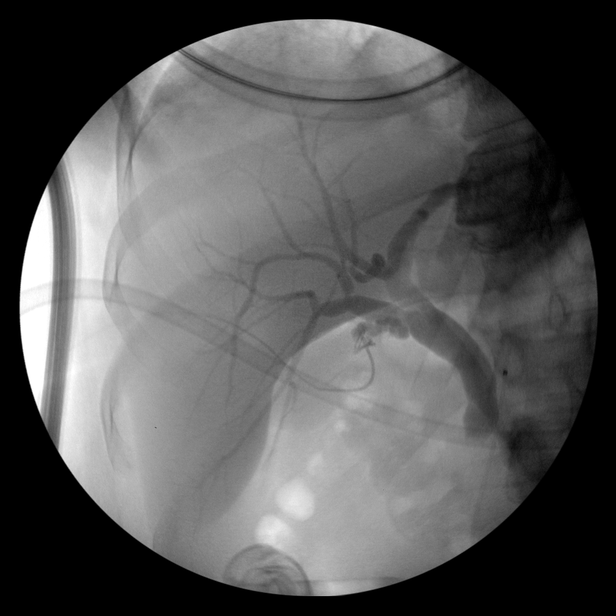
[im 1/3]
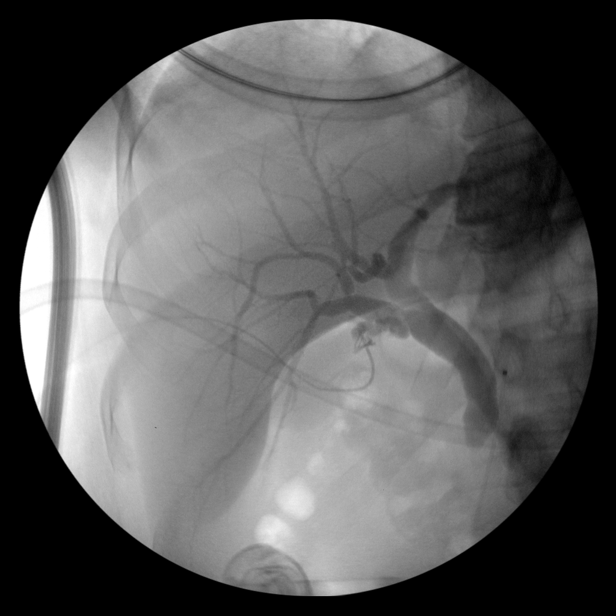
[im 2/3]
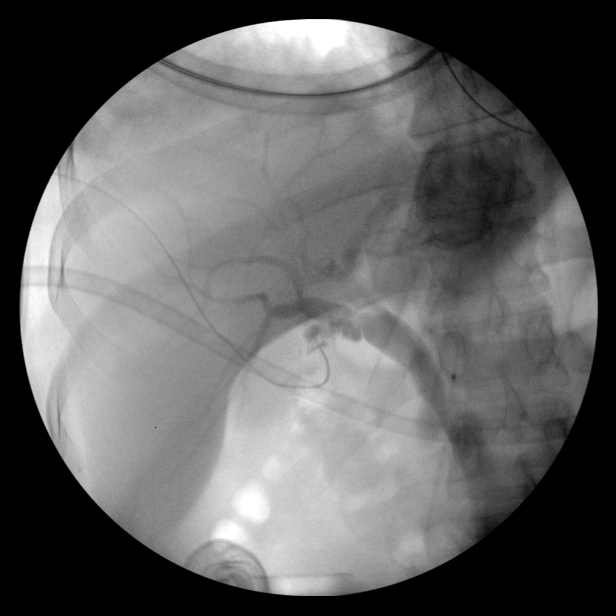
[im 2/3]
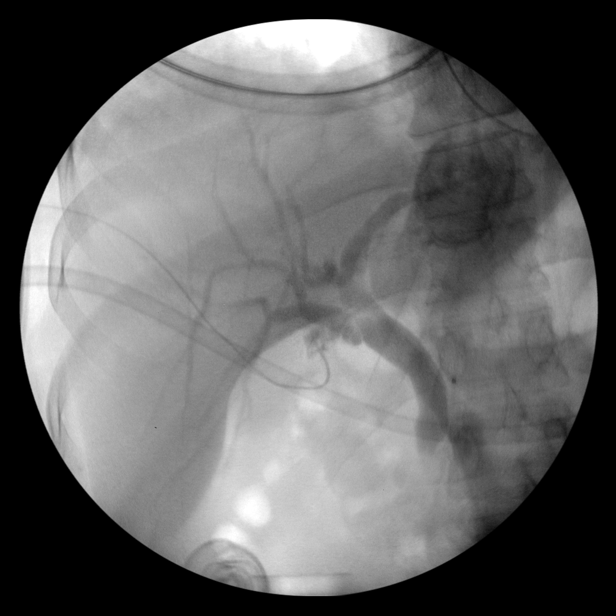
[im 2/3]
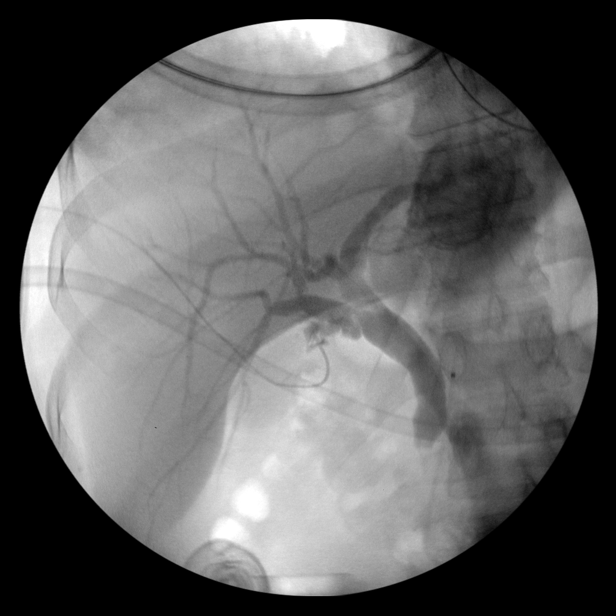
[im 2/3]
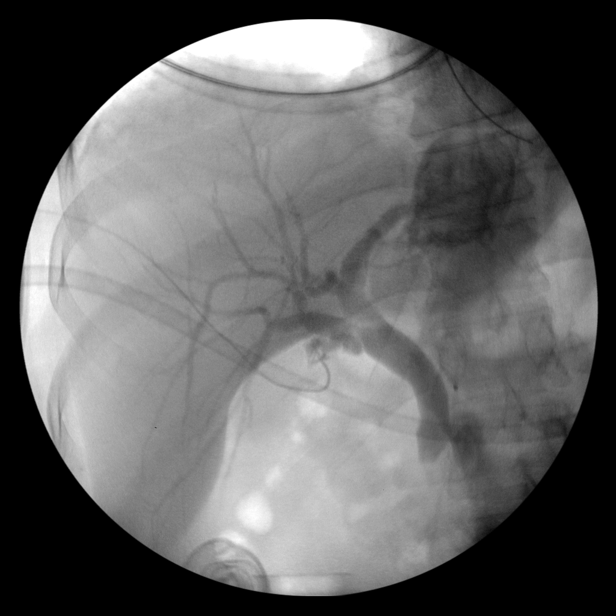
[im 3/3]
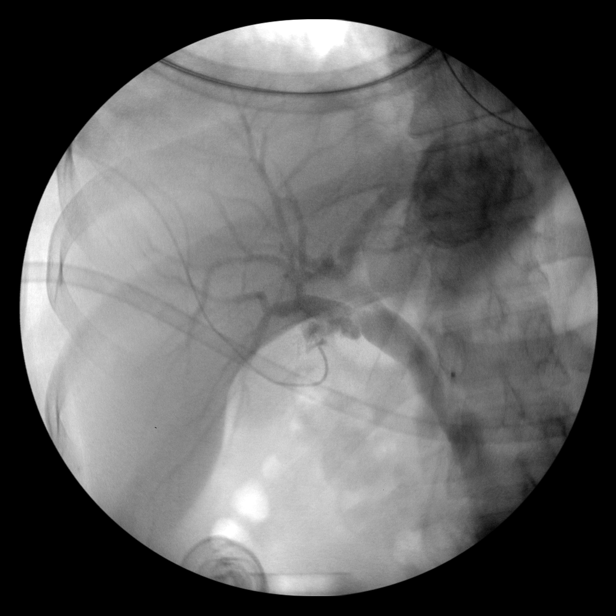
[im 3/3]
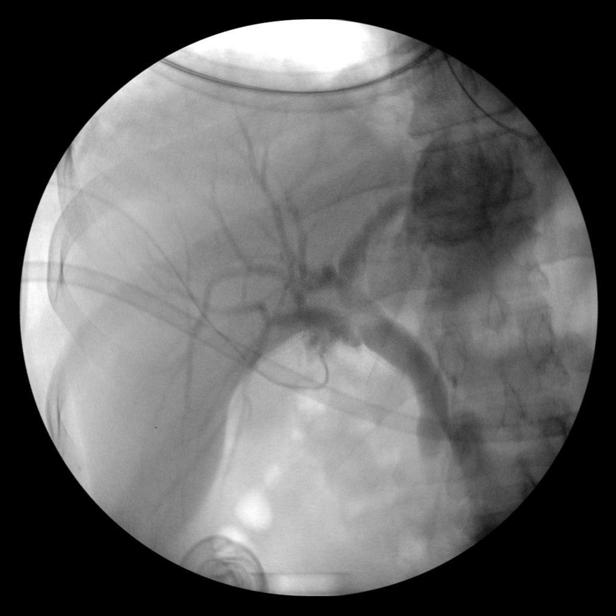
[im 3/3]
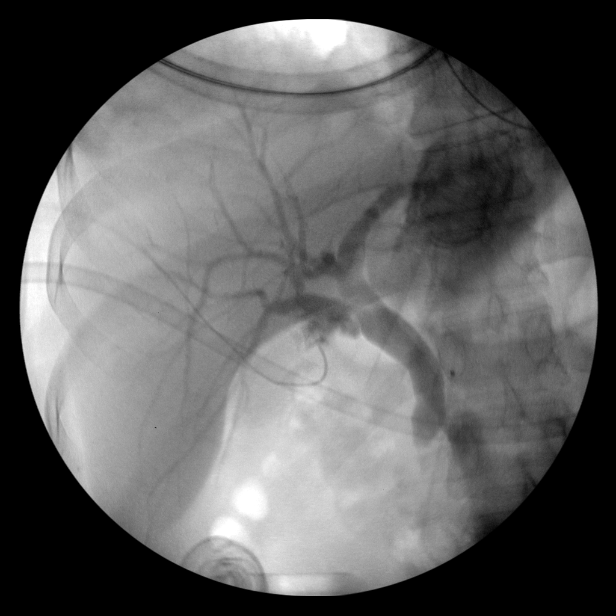
[im 3/3]
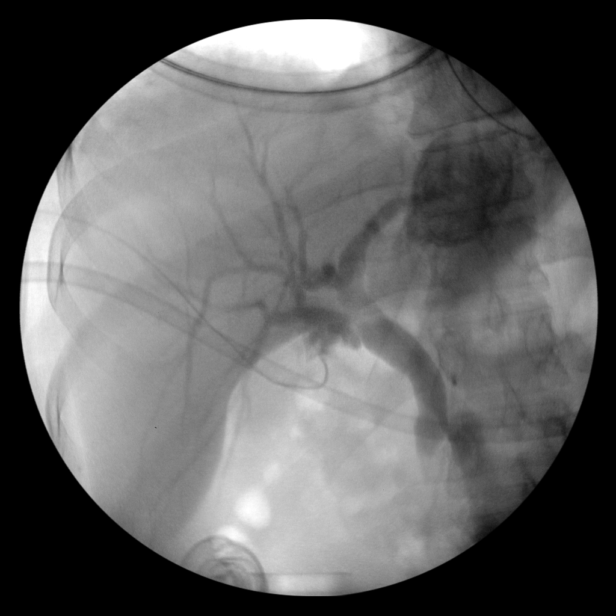

[12 of 12 positions shown; findings below may reference images not displayed]

FINDINGS: Intraoperative cholangiogram performed during laparoscopic
procedure. There is diffuse biliary tree dilatation extending to the
distal CBD. Small distal CBD filling defect suspicious for
obstructing choledocholithiasis. Contrast does not drain easily into
the duodenum.
IMPRESSION: Distal CBD obstructing stone and biliary dilatation.

## 2021-03-13 SURGERY — LAPAROSCOPIC CHOLECYSTECTOMY WITH INTRAOPERATIVE CHOLANGIOGRAM
Anesthesia: General | Site: Abdomen

## 2021-03-13 MED ORDER — PROPOFOL 10 MG/ML IV BOLUS
INTRAVENOUS | Status: DC | PRN
  Administered 2021-03-13: 200 mg via INTRAVENOUS

## 2021-03-13 MED ORDER — LIDOCAINE HCL (PF) 2 % IJ SOLN
INTRAMUSCULAR | Status: AC
  Filled 2021-03-13: qty 5

## 2021-03-13 MED ORDER — BUPIVACAINE-EPINEPHRINE 0.25% -1:200000 IJ SOLN
INTRAMUSCULAR | Status: DC | PRN
  Administered 2021-03-13: 20 mL

## 2021-03-13 MED ORDER — MIDAZOLAM HCL 2 MG/2ML IJ SOLN
INTRAMUSCULAR | Status: AC
  Filled 2021-03-13: qty 2

## 2021-03-13 MED ORDER — FENTANYL CITRATE (PF) 100 MCG/2ML IJ SOLN
INTRAMUSCULAR | Status: AC
  Filled 2021-03-13: qty 2

## 2021-03-13 MED ORDER — LIP MEDEX EX OINT: 1.0000 "application " | TOPICAL_OINTMENT | CUTANEOUS | Status: DC | PRN

## 2021-03-13 MED ORDER — FENTANYL CITRATE PF 50 MCG/ML IJ SOSY: 25.0000 ug | PREFILLED_SYRINGE | INTRAMUSCULAR | Status: DC | PRN

## 2021-03-13 MED ORDER — FENTANYL CITRATE (PF) 100 MCG/2ML IJ SOLN
INTRAMUSCULAR | Status: DC | PRN
  Administered 2021-03-13 (×7): 50 ug via INTRAVENOUS

## 2021-03-13 MED ORDER — DEXMEDETOMIDINE (PRECEDEX) IN NS 20 MCG/5ML (4 MCG/ML) IV SYRINGE
PREFILLED_SYRINGE | INTRAVENOUS | Status: DC | PRN
  Administered 2021-03-13: 8 ug via INTRAVENOUS

## 2021-03-13 MED ORDER — OXYCODONE HCL 5 MG PO TABS: 5.0000 mg | ORAL_TABLET | Freq: Once | ORAL | Status: DC | PRN

## 2021-03-13 MED ORDER — PROPOFOL 10 MG/ML IV BOLUS
INTRAVENOUS | Status: AC
  Filled 2021-03-13: qty 20

## 2021-03-13 MED ORDER — KETOROLAC TROMETHAMINE 30 MG/ML IJ SOLN
INTRAMUSCULAR | Status: DC | PRN
  Administered 2021-03-13: 30 mg via INTRAVENOUS

## 2021-03-13 MED ORDER — OXYCODONE HCL 5 MG/5ML PO SOLN: 5.0000 mg | Freq: Once | ORAL | Status: DC | PRN

## 2021-03-13 MED ORDER — PIPERACILLIN-TAZOBACTAM 3.375 G IVPB
INTRAVENOUS | Status: AC
  Filled 2021-03-13: qty 50

## 2021-03-13 MED ORDER — BUPIVACAINE-EPINEPHRINE (PF) 0.25% -1:200000 IJ SOLN
INTRAMUSCULAR | Status: AC
  Filled 2021-03-13: qty 30

## 2021-03-13 MED ORDER — ONDANSETRON HCL 4 MG/2ML IJ SOLN
INTRAMUSCULAR | Status: AC
  Filled 2021-03-13: qty 2

## 2021-03-13 MED ORDER — ONDANSETRON HCL 4 MG/2ML IJ SOLN: 4.0000 mg | Freq: Once | INTRAMUSCULAR | Status: DC | PRN

## 2021-03-13 MED ORDER — DEXMEDETOMIDINE (PRECEDEX) IN NS 20 MCG/5ML (4 MCG/ML) IV SYRINGE
PREFILLED_SYRINGE | INTRAVENOUS | Status: AC
  Filled 2021-03-13: qty 5

## 2021-03-13 MED ORDER — DOCUSATE SODIUM 100 MG PO CAPS
100.0000 mg | ORAL_CAPSULE | Freq: Every day | ORAL | Status: DC | PRN
  Administered 2021-03-13 – 2021-03-18 (×6): 100 mg via ORAL
  Filled 2021-03-13 (×7): qty 1

## 2021-03-13 MED ORDER — LACTATED RINGERS IR SOLN
Status: DC | PRN
  Administered 2021-03-13: 1000 mL

## 2021-03-13 MED ORDER — DEXAMETHASONE SODIUM PHOSPHATE 10 MG/ML IJ SOLN
INTRAMUSCULAR | Status: AC
  Filled 2021-03-13: qty 1

## 2021-03-13 MED ORDER — SUGAMMADEX SODIUM 500 MG/5ML IV SOLN
INTRAVENOUS | Status: DC | PRN
  Administered 2021-03-13: 400 mg via INTRAVENOUS

## 2021-03-13 MED ORDER — 0.9 % SODIUM CHLORIDE (POUR BTL) OPTIME
TOPICAL | Status: DC | PRN
  Administered 2021-03-13: 09:00:00 1000 mL

## 2021-03-13 MED ORDER — ROCURONIUM BROMIDE 10 MG/ML (PF) SYRINGE
PREFILLED_SYRINGE | INTRAVENOUS | Status: AC
  Filled 2021-03-13: qty 10

## 2021-03-13 MED ORDER — MIDAZOLAM HCL 5 MG/5ML IJ SOLN
INTRAMUSCULAR | Status: DC | PRN
  Administered 2021-03-13: 2 mg via INTRAVENOUS

## 2021-03-13 MED ORDER — ONDANSETRON HCL 4 MG/2ML IJ SOLN
INTRAMUSCULAR | Status: DC | PRN
  Administered 2021-03-13: 4 mg via INTRAVENOUS

## 2021-03-13 MED ORDER — OXYCODONE-ACETAMINOPHEN 5-325 MG PO TABS
1.0000 | ORAL_TABLET | ORAL | Status: DC | PRN
  Administered 2021-03-14 – 2021-03-18 (×10): 1 via ORAL
  Filled 2021-03-13 (×10): qty 1

## 2021-03-13 MED ORDER — AMISULPRIDE (ANTIEMETIC) 5 MG/2ML IV SOLN: 10.0000 mg | Freq: Once | INTRAVENOUS | Status: AC | PRN

## 2021-03-13 MED ORDER — DEXAMETHASONE SODIUM PHOSPHATE 10 MG/ML IJ SOLN
INTRAMUSCULAR | Status: DC | PRN
  Administered 2021-03-13: 10 mg via INTRAVENOUS

## 2021-03-13 MED ORDER — KETOROLAC TROMETHAMINE 30 MG/ML IJ SOLN
INTRAMUSCULAR | Status: AC
  Filled 2021-03-13: qty 1

## 2021-03-13 MED ORDER — AMISULPRIDE (ANTIEMETIC) 5 MG/2ML IV SOLN
INTRAVENOUS | Status: AC
  Administered 2021-03-13: 10:00:00 10 mg via INTRAVENOUS
  Filled 2021-03-13: qty 4

## 2021-03-13 MED ORDER — SUGAMMADEX SODIUM 500 MG/5ML IV SOLN
INTRAVENOUS | Status: AC
  Filled 2021-03-13: qty 5

## 2021-03-13 MED ORDER — ROCURONIUM BROMIDE 10 MG/ML (PF) SYRINGE
PREFILLED_SYRINGE | INTRAVENOUS | Status: DC | PRN
  Administered 2021-03-13: 70 mg via INTRAVENOUS

## 2021-03-13 MED ORDER — IOPAMIDOL (ISOVUE-300) INJECTION 61%
INTRAVENOUS | Status: DC | PRN
  Administered 2021-03-13: 09:00:00 30 mL

## 2021-03-13 MED ORDER — INDOCYANINE GREEN 25 MG IV SOLR
INTRAVENOUS | Status: AC
  Filled 2021-03-13: qty 10

## 2021-03-13 SURGICAL SUPPLY — 37 items
ADH SKN CLS APL DERMABOND .7 (GAUZE/BANDAGES/DRESSINGS) ×1
APL PRP STRL LF DISP 70% ISPRP (MISCELLANEOUS) ×2
APL SRG 38 LTWT LNG FL B (MISCELLANEOUS) ×1
APPLICATOR ARISTA FLEXITIP XL (MISCELLANEOUS) ×1 IMPLANT
APPLIER CLIP ROT 10 11.4 M/L (STAPLE) ×2
APR CLP MED LRG 11.4X10 (STAPLE) ×1
BAG SPEC RTRVL LRG 6X4 10 (ENDOMECHANICALS) ×1
CHLORAPREP W/TINT 26 (MISCELLANEOUS) ×3 IMPLANT
CLIP APPLIE ROT 10 11.4 M/L (STAPLE) ×1 IMPLANT
DECANTER SPIKE VIAL GLASS SM (MISCELLANEOUS) ×2 IMPLANT
DERMABOND ADVANCED (GAUZE/BANDAGES/DRESSINGS) ×1
DERMABOND ADVANCED .7 DNX12 (GAUZE/BANDAGES/DRESSINGS) IMPLANT
DRAPE C-ARM 42X120 X-RAY (DRAPES) ×2 IMPLANT
DRAPE SHEET LG 3/4 BI-LAMINATE (DRAPES) ×2 IMPLANT
DRAPE WARM FLUID 44X44 (DRAPES) IMPLANT
ELECT REM PT RETURN 15FT ADLT (MISCELLANEOUS) ×2 IMPLANT
GLOVE SURG MICRO LTX SZ7.5 (GLOVE) ×1 IMPLANT
GLOVE SURG UNDER LTX SZ8 (GLOVE) IMPLANT
GOWN STRL REUS W/TWL XL LVL3 (GOWN DISPOSABLE) ×4 IMPLANT
HEMOSTAT ARISTA ABSORB 3G PWDR (HEMOSTASIS) ×1 IMPLANT
HEMOSTAT SURGICEL 4X8 (HEMOSTASIS) IMPLANT
IRRIG SUCT STRYKERFLOW 2 WTIP (MISCELLANEOUS) ×2
IRRIGATION SUCT STRKRFLW 2 WTP (MISCELLANEOUS) ×1 IMPLANT
KIT BASIN OR (CUSTOM PROCEDURE TRAY) ×2 IMPLANT
KIT TURNOVER KIT A (KITS) IMPLANT
POUCH SPECIMEN RETRIEVAL 10MM (ENDOMECHANICALS) ×2 IMPLANT
SCISSORS LAP 5X35 DISP (ENDOMECHANICALS) ×1 IMPLANT
SET CHOLANGIOGRAPH MIX (MISCELLANEOUS) ×2 IMPLANT
SET TUBE SMOKE EVAC HIGH FLOW (TUBING) ×1 IMPLANT
SLEEVE XCEL OPT CAN 5 100 (ENDOMECHANICALS) ×2 IMPLANT
SUT MNCRL AB 4-0 PS2 18 (SUTURE) ×2 IMPLANT
TOWEL OR 17X26 10 PK STRL BLUE (TOWEL DISPOSABLE) ×2 IMPLANT
TOWEL OR NON WOVEN STRL DISP B (DISPOSABLE) ×2 IMPLANT
TRAY LAPAROSCOPIC (CUSTOM PROCEDURE TRAY) ×2 IMPLANT
TROCAR BLADELESS OPT 5 100 (ENDOMECHANICALS) ×2 IMPLANT
TROCAR XCEL BLUNT TIP 100MML (ENDOMECHANICALS) ×2 IMPLANT
TROCAR XCEL NON-BLD 11X100MML (ENDOMECHANICALS) ×2 IMPLANT

## 2021-03-13 NOTE — Anesthesia Procedure Notes (Addendum)
Procedure Name: Intubation Date/Time: 03/13/2021 8:41 AM Performed by: West Pugh, CRNA Pre-anesthesia Checklist: Patient identified, Emergency Drugs available, Suction available, Patient being monitored and Timeout performed Patient Re-evaluated:Patient Re-evaluated prior to induction Oxygen Delivery Method: Circle system utilized Preoxygenation: Pre-oxygenation with 100% oxygen Induction Type: IV induction Ventilation: Mask ventilation without difficulty Laryngoscope Size: Mac and 3 Grade View: Grade II Tube type: Oral Tube size: 7.0 mm Number of attempts: 2 Airway Equipment and Method: Stylet Placement Confirmation: ETT inserted through vocal cords under direct vision, positive ETCO2, CO2 detector and breath sounds checked- equal and bilateral Secured at: 21 cm Tube secured with: Tape Dental Injury: Teeth and Oropharynx as per pre-operative assessment  Comments: Grade 2 view for 1st attempt, unable to pass ETT per CRNA. Attempt by Dr Christella Hartigan and successful.

## 2021-03-13 NOTE — Progress Notes (Signed)
Pt going down to OR for procedure. Alert and oriented. Aware of surgery. Awaiting MD to order consent. OR nurse stating she will get it in pre-op.

## 2021-03-13 NOTE — Progress Notes (Signed)
Pt has returned to Room 1511 from PACU. Alert and oriented.

## 2021-03-13 NOTE — Progress Notes (Signed)
PROGRESS NOTE    Lynn Simpson  EPP:[id number] DOB: [birth year] DOA: 03/10/2021 PCP: Lynn Pao, MD  Chief Complaint  Patient presents with   Emesis    Brief Narrative:  Lynn Simpson is Lynn Simpson 50 y.o. female with Lynn Simpson histoy of Lynch syndrome. She presented secondary to RUQ pain and acholic stools with evidence of cholecystitis and choledocholithiasis on imaging. Plan for ERCP followed by cholecystectomy.    Assessment & Plan:   Principal Problem:   Choledocholithiasis with acute cholecystitis Active Problems:   Elevated LFTs   * Choledocholithiasis with acute cholecystitis- (present on admission) MRCP 12/22 with cholelithiasis without cholecystitis and choledocholithiasis with 3 small calculi up to 3 mm in diameter within distal common duct - mild superimposed extrahepatic biliary ductal dilatation S/p incomplete ERCP on 12/23 S/p lap cholecystectomy with IOC on 12/24  Intraoperative cholangiogram with distal CBD obstructing stone and biliary dilatation Appreciate GI recs, planning for IR consult, perc biliary drain Monday morning NPO after midnight 12/25 Abx  Elevated LFTs 2/2 choledocholithiasis Negative acute hepatitis panel   DVT prophylaxis: SCD Code Status: full Family Communication: none at bedside Disposition:   Status is: Inpatient  Remains inpatient appropriate because: need for IR c/s       Consultants:  GI surgery  Procedures:  ERCP Lap chole with IOC  Antimicrobials:  Anti-infectives (From admission, onward)    Start     Dose/Rate Route Frequency Ordered Stop   03/13/21 0809  piperacillin-tazobactam (ZOSYN) 3.375 GM/50ML IVPB       Note to Pharmacy: Lynn Simpson L: cabinet override      03/13/21 0809 03/13/21 0849   03/12/21 1245  ceFAZolin (ANCEF) IVPB 2g/100 mL premix  Status:  Discontinued        2 g 200 mL/hr over 30 Minutes Intravenous On call to O.R. 03/12/21 1218 03/12/21 1838   03/11/21 0000  piperacillin-tazobactam (ZOSYN)  IVPB 3.375 g        3.375 g 12.5 mL/hr over 240 Minutes Intravenous Every 8 hours 03/10/21 2336     03/10/21 1345  piperacillin-tazobactam (ZOSYN) IVPB 3.375 g        3.375 g 100 mL/hr over 30 Minutes Intravenous  Once 03/10/21 1330 03/10/21 1635       Subjective: Wondering what next steps will be  Objective: Vitals:   03/13/21 1100 03/13/21 1233 03/13/21 1404 03/13/21 1852  BP: (!) 158/90 (!) 165/90 133/85 (!) 148/104  Pulse: 80 71 81 (!) 103  Resp: 16 18 18 18   Temp: (!) 97.4 F (36.3 C) (!) 97.4 F (36.3 C) 97.6 F (36.4 C) 97.7 F (36.5 C)  TempSrc: Oral Oral Oral   SpO2: 97% 97% 98% 96%  Weight:      Height:        Intake/Output Summary (Last 24 hours) at 03/13/2021 1947 Last data filed at 03/13/2021 1500 Gross per 24 hour  Intake 1380.63 ml  Output 1 ml  Net 1379.63 ml   Filed Weights   03/10/21 0949  Weight: 115.7 kg    Examination:  General exam: Appears calm and comfortable  Respiratory system: unlabored Cardiovascular system: RRR Gastrointestinal system: Abdomen is nondistended, soft and nontender.  Central nervous system: Alert and oriented. No focal neurological deficits. Extremities: no lee Skin: No rashes, lesions or ulcers Psychiatry: Judgement and insight appear normal. Mood & affect appropriate.     Data Reviewed: I have personally reviewed following labs and imaging studies  CBC: Recent Labs  Lab 03/10/21 1021 03/11/21  0554 03/12/21 0540  WBC 6.2 6.3 5.9  NEUTROABS 3.6 3.2  --   HGB 15.3* 14.4 13.5  HCT 45.3 42.8 39.8  MCV 89.7 89.9 90.9  PLT 180 166 250    Basic Metabolic Panel: Recent Labs  Lab 03/10/21 1021 03/11/21 0554 03/12/21 0540 03/13/21 0518  NA 137 139 139 138  K 3.6 3.3* 3.6 4.0  CL 101 105 106 104  CO2 27 24 26 25   GLUCOSE 99 75 92 109*  BUN 12 11 8 8   CREATININE 0.52 0.67 0.74 0.58  CALCIUM 8.9 8.7* 8.7* 9.4    GFR: Estimated Creatinine Clearance: 103.6 mL/min (by C-G formula based on SCr of 0.58  mg/dL).  Liver Function Tests: Recent Labs  Lab 03/10/21 1021 03/11/21 0554 03/12/21 0540 03/13/21 0518  AST 604* 353* 244* 272*  ALT 888* 650* 533* 518*  ALKPHOS 207* 188* 196* 248*  BILITOT 2.8* 3.4* 2.5* 2.7*  PROT 7.7 7.2 6.5 7.4  ALBUMIN 4.1 4.0 3.6 3.9    CBG: No results for input(s): GLUCAP in the last 168 hours.   Recent Results (from the past 240 hour(s))  Resp Panel by RT-PCR (Flu Lynn Simpson&B, Covid) Nasopharyngeal Swab     Status: None   Collection Time: 03/10/21 10:06 AM   Specimen: Nasopharyngeal Swab; Nasopharyngeal(NP) swabs in vial transport medium  Result Value Ref Range Status   SARS Coronavirus 2 by RT PCR NEGATIVE NEGATIVE Final    Comment: (NOTE) SARS-CoV-2 target nucleic acids are NOT DETECTED.  The SARS-CoV-2 RNA is generally detectable in upper respiratory specimens during the acute phase of infection. The lowest concentration of SARS-CoV-2 viral copies this assay can detect is 138 copies/mL. Lynn Simpson negative result does not preclude SARS-Cov-2 infection and should not be used as the sole basis for treatment or other patient management decisions. Lynn Simpson negative result may occur with  improper specimen collection/handling, submission of specimen other than nasopharyngeal swab, presence of viral mutation(s) within the areas targeted by this assay, and inadequate number of viral copies(<138 copies/mL). Lynn Simpson negative result must be combined with clinical observations, patient history, and epidemiological information. The expected result is Negative.  Fact Sheet for Patients:  EntrepreneurPulse.com.au  Fact Sheet for Healthcare Providers:  IncredibleEmployment.be  This test is no t yet approved or cleared by the [place] FDA and  has been authorized for detection and/or diagnosis of SARS-CoV-2 by FDA under an Emergency Use Authorization (EUA). This EUA will remain  in effect (meaning this test can be used) for the duration of  the COVID-19 declaration under Section 564(b)(1) of the Act, 21 U.S.C.section 360bbb-3(b)(1), unless the authorization is terminated  or revoked sooner.       Influenza Lynn Simpson by PCR NEGATIVE NEGATIVE Final   Influenza B by PCR NEGATIVE NEGATIVE Final    Comment: (NOTE) The Xpert Xpress SARS-CoV-2/FLU/RSV plus assay is intended as an aid in the diagnosis of influenza from Nasopharyngeal swab specimens and should not be used as Anddy Wingert sole basis for treatment. Nasal washings and aspirates are unacceptable for Xpert Xpress SARS-CoV-2/FLU/RSV testing.  Fact Sheet for Patients: EntrepreneurPulse.com.au  Fact Sheet for Healthcare Providers: IncredibleEmployment.be  This test is not yet approved or cleared by the [place] FDA and has been authorized for detection and/or diagnosis of SARS-CoV-2 by FDA under an Emergency Use Authorization (EUA). This EUA will remain in effect (meaning this test can be used) for the duration of the COVID-19 declaration under Section 564(b)(1) of the Act, 21 U.S.C. section 360bbb-3(b)(1), unless the  authorization is terminated or revoked.  Performed at [facility], [address] [place], [place] [address]   Culture, blood (routine x 2)     Status: None (Preliminary result)   Collection Time: 03/10/21  1:49 PM   Specimen: BLOOD  Result Value Ref Range Status   Specimen Description BLOOD LEFT ANTECUBITAL  Final   Special Requests   Final    BOTTLES DRAWN AEROBIC ONLY Blood Culture adequate volume   Culture   Final    NO GROWTH 3 DAYS Performed at [facility], [place] [address], [place], [place] [address]    Report Status PENDING  Incomplete  Culture, blood (routine x 2)     Status: None (Preliminary result)   Collection Time: 03/10/21  2:00 PM   Specimen: BLOOD  Result Value Ref Range Status   Specimen Description   Final    BLOOD Blood Culture adequate volume Performed at [facility],  [address] [place], [place] [address]    Special Requests   Final    BOTTLES DRAWN AEROBIC AND ANAEROBIC BLOOD LEFT HAND Performed at [facility], [address] [place], [place] [address]    Culture   Final    NO GROWTH 3 DAYS Performed at [facility], [place] [address] [place], [place] [address]    Report Status PENDING  Incomplete         Radiology Studies: DG Cholangiogram Operative  Result Date: 03/13/2021 CLINICAL DATA:  Acute calculus cholecystitis EXAM: INTRAOPERATIVE CHOLANGIOGRAM TECHNIQUE: Cholangiographic images from the C-arm fluoroscopic device were submitted for interpretation post-operatively. Please see the procedural report for the amount of contrast and the fluoroscopy time utilized. COMPARISON:  03/11/2021 FINDINGS: Intraoperative cholangiogram performed during laparoscopic procedure. There is diffuse biliary tree dilatation extending to the distal CBD. Small distal CBD filling defect suspicious for obstructing choledocholithiasis. Contrast does not drain easily into the duodenum. IMPRESSION: Distal CBD obstructing stone and biliary dilatation. Electronically Signed   By: Jerilynn Mages.  Shick M.D.   On: 03/13/2021 10:13   DG ERCP WITH SPHINCTEROTOMY  Result Date: 03/12/2021 CLINICAL DATA:  Choledocholithiasis with acute cholecystitis EXAM: ERCP TECHNIQUE: Multiple spot images obtained with the fluoroscopic device and submitted for interpretation post-procedure. FLUOROSCOPY TIME:  Fluoroscopy Time:  4 minutes 4 seconds Number of Acquired Spot Images: 0 COMPARISON:  MRCP 03/11/2021 FINDINGS: Four intraoperative saved images are submitted for review demonstrating Suleman Gunning flexible duodenal scope in the descending duodenum with wire cannulation of the common bile duct and placement of Cella Cappello biliary stent. IMPRESSION: ERCP as above. These images were submitted for radiologic interpretation only. Please see the procedural report for the amount of contrast and the  fluoroscopy time utilized. Electronically Signed   By: Jacqulynn Cadet M.D.   On: 03/12/2021 16:43   DG C-Arm 1-60 Min-No Report  Result Date: 03/12/2021 Fluoroscopy was utilized by the requesting physician.  No radiographic interpretation.        Scheduled Meds:  indomethacin  100 mg Rectal Once   pantoprazole  20 mg Oral Daily   Continuous Infusions:  sodium chloride 75 mL/hr at 03/13/21 1100   lactated ringers 10 mL/hr at 03/13/21 0827   piperacillin-tazobactam (ZOSYN)  IV 3.375 g (03/13/21 1612)     LOS: 3 days    Time spent: over 54 mni    Fayrene Helper, MD [facility]   To contact the attending provider between 7A-7P or the covering provider during after  hours 7P-7A, please log into the web site [email or web address] and access using universal [place] password for that web site. If you do not have the password, please call the hospital operator.  03/13/2021, 7:47 PM

## 2021-03-13 NOTE — Hospital Course (Addendum)
Lynn Simpson is Lynn Simpson 50 y.o. female with Amir Glaus histoy of Lynch syndrome. She presented secondary to RUQ pain and acholic stools with evidence of cholecystitis and choledocholithiasis on imaging. She had incomplete ERCP followed by lap chole with IOC showing distal CBD obstructing stone.  She's now s/p failed ext/int biliary drain by IR.  At this time, she has some worsened post procedural pain.  LFT's slightly improved.  Discharge pending improvement in pain and further improvement in LFT's and clearance by GI.  See below for additional details.

## 2021-03-13 NOTE — Progress Notes (Signed)
Transition of Care [facility]) Screening Note  Patient Details  Name: Lynn Simpson Date of Birth: [birth year]  Transition of Care [facility]) CM/SW Contact:    Sherie Don, LCSW Phone Number: 03/13/2021, 10:41 AM  Transition of Care Department [facility]) has reviewed patient and no TOC needs have been identified at this time. We will continue to monitor patient advancement through interdisciplinary progression rounds. If new patient transition needs arise, please place a TOC consult.

## 2021-03-13 NOTE — Progress Notes (Signed)
[facility] Progress Note    Since last GI note: Incomplete ERCP yesterday.  This AM she underwent lap chole and IOC, unable to push stone out during that surgery.  I discussed this with her in her room just now.  Objective: Vital signs in last 24 hours: Temp:  [97.2 F (36.2 C)-99.1 F (37.3 C)] 97.4 F (36.3 C) (12/24 1233) Pulse Rate:  [70-101] 71 (12/24 1233) Resp:  [12-24] 18 (12/24 1233) BP: (116-165)/(53-90) 165/90 (12/24 1233) SpO2:  [93 %-100 %] 97 % (12/24 1233) Last BM Date: 03/10/21 General: alert and oriented times 3 Heart: regular rate and rythm Abdomen: soft, non-tender, non-distended, normal bowel sounds   Lab Results: Recent Labs    03/11/21 0554 03/12/21 0540  WBC 6.3 5.9  HGB 14.4 13.5  PLT 166 152  MCV 89.9 90.9   Recent Labs    03/11/21 0554 03/12/21 0540 03/13/21 0518  NA 139 139 138  K 3.3* 3.6 4.0  CL 105 106 104  CO2 24 26 25   GLUCOSE 75 92 109*  BUN 11 8 8   CREATININE 0.67 0.74 0.58  CALCIUM 8.7* 8.7* 9.4   Recent Labs    03/11/21 0554 03/12/21 0540 03/13/21 0518  PROT 7.2 6.5 7.4  ALBUMIN 4.0 3.6 3.9  AST 353* 244* 272*  ALT 650* 533* 518*  ALKPHOS 188* 196* 248*  BILITOT 3.4* 2.5* 2.7*  BILIDIR 1.7* 1.4*  --   IBILI 1.7* 1.1*  --      Medications: Scheduled Meds:  indomethacin  100 mg Rectal Once   pantoprazole  20 mg Oral Daily   Continuous Infusions:  sodium chloride 75 mL/hr at 03/13/21 1100   lactated ringers 10 mL/hr at 03/13/21 0827   piperacillin-tazobactam (ZOSYN)  IV 12.5 mL/hr at 03/13/21 0116   PRN Meds:.acetaminophen **OR** acetaminophen, HYDROmorphone (DILAUDID) injection, lip balm, ondansetron **OR** ondansetron (ZOFRAN) IV, oxyCODONE-acetaminophen    Assessment/Plan: 50 y.o. female choledocholithiasis, s/p lap chole this morning, s/p incomplete ERCP yesterday.  I discussed her case with IR Dr. Pascal Lux, he is planning on helping with percutaneous biliary drain, internal/external on  Monday morning. That drain will bypass the CBD stone and will also serve a conduit for a rendezvous ERCP as an outpatient with Dr. Rush Landmark in a few weeks.  Please make sure she is NPO after MN tomorrow night and that she receive no blood thinners.  Should continue IV abx for now.     Milus Banister, MD  03/13/2021, 1:11 PM [facility] Pager [phone]

## 2021-03-13 NOTE — Transfer of Care (Signed)
Immediate Anesthesia Transfer of Care Note  Patient: RIE MCNEIL  Procedure(s) Performed: LAPAROSCOPIC CHOLECYSTECTOMY WITH  INTRAOPERATIVE CHOLANGIOGRAM (Abdomen)  Patient Location: PACU  Anesthesia Type:General  Level of Consciousness: awake, alert  and patient cooperative  Airway & Oxygen Therapy: Patient Spontanous Breathing and Patient connected to face mask oxygen  Post-op Assessment: Report given to RN and Post -op Vital signs reviewed and stable  Post vital signs: Reviewed and stable  Last Vitals:  Vitals Value Taken Time  BP 136/82 03/13/21 1011  Temp    Pulse 94 03/13/21 1013  Resp 18 03/13/21 1013  SpO2 99 % 03/13/21 1013  Vitals shown include unvalidated device data.  Last Pain:  Vitals:   03/13/21 0804  TempSrc: Oral  PainSc:          Complications: No notable events documented.

## 2021-03-13 NOTE — Op Note (Signed)
Preoperative diagnosis: Choledocholithiasis  Postoperative diagnosis acute cholecystitis with choledocholithiasis  Procedure: Laparoscopic cholecystectomy with intraoperative cholangiogram  Surgeon: Erroll Luna, MD  Anesthesia: General with 0.25% Marcaine plain  EBL: 30 cc  Drains: None  Specimen: Gallbladder to pathology  Indications for procedure: The patient is a 50 year old female admitted with choledocholithiasis and acute cholecystitis.  She underwent failed ERCP yesterday for stone extraction.  Options were discussed between her and her gastroenterologist as well as me about how to proceed.  We talked about transfer to a tertiary care center for stone extraction, proceeding with surgery with intraoperative cholangiogram and attempt to flush the stone out with the use of CCK if available, a combined approach with interventional radiology and GI medicine.  After discussion of all the options and the pros and cons of them, she desired gallbladder removal first.  I explained to her there could be a small issue requires additional surgery after this depending on the ability to extract the common bile duct stone.  She understood that.  Of note, I discussed the shortage of T-tube necessary for common duct exploration and given her anatomy did not think a transthoracic approach would be feasible.  After explaining all the above to her, she agreed to proceed with laparoscopic cholecystectomy as her first step.   The procedure has been discussed with the patient. Operative and non operative treatments have been discussed. Risks of surgery include bleeding, infection,  Common bile duct injury,  Injury to the stomach,liver, colon,small intestine, abdominal wall,  Diaphragm,  Major blood vessels,  And the need for an open procedure.  Other risks include worsening of medical problems, death,  DVT and pulmonary embolism, and cardiovascular events.   Medical options have also been discussed. The patient  has been informed of long term expectations of surgery and non surgical options,  The patient agrees to proceed.      Description of procedure: The patient was met in the holding area and questions answered.  She was then taken back to the operating.  She is placed supine upon the OR table.  After induction general esthesia, the abdomen was prepped and draped in sterile fashion and timeout was performed.  Proper patient, site and procedure verified.  Local anesthetic was infiltrated just below the umbilicus.  A 1 cm incision was made and dissection was carried down to the fascia.  The fascia was opened in the midline and the abdominal cavity was entered without difficulty.  Pursestring suture 0 Vicryl was placed there.  The 12 mm Hassan cannula was placed under direct vision.  Pneumoperitoneum was created to 15 mmHg of CO2 pressure.  Laparoscope placed.  Four-quadrant laparoscopy performed.  She had some lower abdominal adhesions and possibly a small lower midline incisional hernia from previous surgery.  This was quite small and difficult to sit and see due to bowel gas and position of the patient.  Gallbladder identified.  11 mm subxiphoid port was placed.  There were 2 other small ports placed in the right upper quadrant.  These were 5 mm ports.  The gallbladder is identified.  Is quite tense and she has signs of acute cholecystitis.  I decompressed it with a needle.  This pain was soft and pliable.  We were then able to dissect out the infundibulum carefully.  Critical view was obtained.  The cystic duct and cystic artery were well dissected out visualized.  A clip was placed on the cystic duct as into the gallbladder.  A small incision  was made the duct and through a separate stab incision a cholangiogram cath was placed.  Interval cholangiogram revealed a dilated duct system.  The cystic duct drain in a tortuous fashion into the common duct.  The common bile duct dilated as well as right lower tabetic duct.   There was distal obstruction noted for stone and there is verbal contrast to get by it.  This was flushed multiple times.  Of note there is no CCK available due to a shortage.  We did multiple attempts of flushing this and unfortunately could not dislodge the stone have it flushed through.  After numerous attempts at doing this the catheter was removed and the cystic duct stump was controlled with 3 clips.  The cystic artery was controlled with multiple clips as well and divided.  The posterior cystic artery branch was divided between clips.  The gallbladder is then dissected off the gallbladder bed.  There is some bleeding from the gallbladder bed controlled with cautery.  The gallbladder is placed into an Endo Catch bag.  We controlled the oozing from the gallbladder bed with cautery and placed Arista powder in there.  This showed good hemostasis.  There is no evidence of bile leakage or bleeding.  Excess irrigation was suctioned out as well as blood.  The gallbladder was extracted at the umbilical incision.  This was closed with 0 Vicryl in place.  Is passed off the field.  Final inspection revealed no bleeding and four-quadrant laparoscopy revealed no other abnormality.  All ports were then removed the CO2 was allowed to escape.  Monocryl used to close skin.  Dermabond applied.  All counts were found to be correct.  Patient awoke extubated taken to recovery in satisfactory condition.   I will defer to GI medicine about next  steps concerning her common duct stone whether it be transfer or a combined interventional radiology / GI procedure.  Note, there is not the necessary equipment for common duct exploration here so recommend repeat ERCP with or without intervention radiology.  She does have large ducts that I think could be cannulated by interventional radiology and a wire passed downstream and possibly do this via that route.  If the above not possible, recommend referral to a tertiary care center for  further management of her common duct stone.

## 2021-03-13 NOTE — Anesthesia Postprocedure Evaluation (Signed)
Anesthesia Post Note  Patient: Lynn Simpson  Procedure(s) Performed: LAPAROSCOPIC CHOLECYSTECTOMY WITH  INTRAOPERATIVE CHOLANGIOGRAM (Abdomen)     Patient location during evaluation: PACU Anesthesia Type: General Level of consciousness: awake and alert Pain management: pain level controlled Vital Signs Assessment: post-procedure vital signs reviewed and stable Respiratory status: spontaneous breathing, nonlabored ventilation and respiratory function stable Cardiovascular status: blood pressure returned to baseline and stable Postop Assessment: no apparent nausea or vomiting Anesthetic complications: no   No notable events documented.  Last Vitals:  Vitals:   03/13/21 1015 03/13/21 1030  BP: (!) 145/77 140/66  Pulse: 88 94  Resp: 12 13  Temp:    SpO2: 95% 99%    Last Pain:  Vitals:   03/13/21 1030  TempSrc:   PainSc: 0-No pain                 Lidia Collum

## 2021-03-13 NOTE — Anesthesia Preprocedure Evaluation (Addendum)
Anesthesia Evaluation  Patient identified by MRN, date of birth, ID band Patient awake    Reviewed: Allergy & Precautions, NPO status , Patient's Chart, lab work & pertinent test results  History of Anesthesia Complications Negative for: history of anesthetic complications  Airway Mallampati: I  TM Distance: >3 FB Neck ROM: Full    Dental  (+) Dental Advisory Given, Teeth Intact   Pulmonary asthma , Current Smoker and Patient abstained from smoking.,    Pulmonary exam normal        Cardiovascular negative cardio ROS Normal cardiovascular exam     Neuro/Psych negative neurological ROS     GI/Hepatic Neg liver ROS, GERD  ,Choledocholithiasis s/p unsuccessful ERCP yesterday   Endo/Other  Morbid obesity  Renal/GU negative Renal ROS  negative genitourinary   Musculoskeletal negative musculoskeletal ROS (+)   Abdominal   Peds  Hematology negative hematology ROS (+)   Anesthesia Other Findings   Reproductive/Obstetrics                            Anesthesia Physical Anesthesia Plan  ASA: 3  Anesthesia Plan: General   Post-op Pain Management: Ofirmev IV (intra-op) and Toradol IV (intra-op)   Induction: Intravenous  PONV Risk Score and Plan: 3 and Ondansetron, Dexamethasone, Treatment may vary due to age or medical condition and Midazolam  Airway Management Planned: Oral ETT  Additional Equipment: None  Intra-op Plan:   Post-operative Plan: Extubation in OR  Informed Consent: I have reviewed the patients History and Physical, chart, labs and discussed the procedure including the risks, benefits and alternatives for the proposed anesthesia with the patient or authorized representative who has indicated his/her understanding and acceptance.     Dental advisory given  Plan Discussed with:   Anesthesia Plan Comments:         Anesthesia Quick Evaluation

## 2021-03-13 NOTE — Interval H&P Note (Signed)
History and Physical Interval Note:  03/13/2021 7:35 AM  Lynn Simpson  has presented today for surgery, with the diagnosis of lap chole.  The various methods of treatment have been discussed with the patient and family. After consideration of risks, benefits and other options for treatment, the patient has consented to  Procedure(s): LAPAROSCOPIC CHOLECYSTECTOMY WITH POSSIBLE INTRAOPERATIVE CHOLANGIOGRAM (N/A) as a surgical intervention.  The patient's history has been reviewed, patient examined, no change in status, stable for surgery.  I have reviewed the patient's chart and labs.  Questions were answered to the patient's satisfaction.   Patient seen and examined.  ERCP and note from GI reviewed.  Discussed options of transfer, laparoscopic cholecystectomy with cholangiogram with an attempt to flush out the stone, CCK administration, delayed ERCP, combined procedure with interventional radiology and GI.  We reviewed all the options and pros and cons.  There will be a delay in transfer more likely to the holiday weekend.  We discussed the pros and cons of cholecystectomy now and complications of that and the potential for additional surgery down the road if other attempts at stone extraction from the common bile duct were unsuccessful.  After weighing all of her options carefully, she wishes to proceed with laparoscopic cholecystectomy at this time.  I discussed complications of bleeding, infection, stump blowout, injury to neighboring structures, the need for additional surgeries and/or common duct exploration at a later time.  She understands the above.  Her liver functions slightly improved hopefully she is not totally obstructed and success rates of flushing the stone out of 20% or less but I think worth a shot at this point.  She is been made aware this and wishes to proceed.  Turner Daniels MD

## 2021-03-14 ENCOUNTER — Encounter (HOSPITAL_COMMUNITY): Payer: Self-pay | Admitting: Surgery

## 2021-03-14 LAB — COMPREHENSIVE METABOLIC PANEL
ALT: 601 U/L — ABNORMAL HIGH (ref 0–44)
AST: 391 U/L — ABNORMAL HIGH (ref 15–41)
Albumin: 3.9 g/dL (ref 3.5–5.0)
Alkaline Phosphatase: 268 U/L — ABNORMAL HIGH (ref 38–126)
Anion gap: 4 — ABNORMAL LOW (ref 5–15)
BUN: 12 mg/dL (ref 6–20)
CO2: 24 mmol/L (ref 22–32)
Calcium: 8.4 mg/dL — ABNORMAL LOW (ref 8.9–10.3)
Chloride: 109 mmol/L (ref 98–111)
Creatinine, Ser: 0.78 mg/dL (ref 0.44–1.00)
GFR, Estimated: >60 mL/min (ref >60)
Glucose, Bld: 103 mg/dL — ABNORMAL HIGH (ref 70–99)
Potassium: 3.7 mmol/L (ref 3.5–5.1)
Sodium: 137 mmol/L (ref 135–145)
Total Bilirubin: 2.1 mg/dL — ABNORMAL HIGH (ref 0.3–1.2)
Total Protein: 7.2 g/dL (ref 6.5–8.1)

## 2021-03-14 MED ORDER — CLONAZEPAM 0.125 MG PO TBDP
0.2500 mg | ORAL_TABLET | Freq: Two times a day (BID) | ORAL | Status: DC | PRN
  Administered 2021-03-16 – 2021-03-18 (×4): 0.25 mg via ORAL
  Filled 2021-03-14 (×4): qty 2

## 2021-03-14 MED ORDER — SENNA 8.6 MG PO TABS
2.0000 | ORAL_TABLET | Freq: Every day | ORAL | Status: DC
  Administered 2021-03-17 – 2021-03-18 (×3): 17.2 mg via ORAL
  Filled 2021-03-14 (×3): qty 2

## 2021-03-14 MED ORDER — POLYETHYLENE GLYCOL 3350 17 G PO PACK
17.0000 g | PACK | Freq: Two times a day (BID) | ORAL | Status: DC
  Administered 2021-03-14 – 2021-03-18 (×6): 17 g via ORAL
  Filled 2021-03-14 (×7): qty 1

## 2021-03-14 MED ORDER — TRAZODONE HCL 50 MG PO TABS
50.0000 mg | ORAL_TABLET | Freq: Every evening | ORAL | Status: DC | PRN
  Administered 2021-03-14 – 2021-03-15 (×2): 50 mg via ORAL
  Filled 2021-03-14 (×2): qty 1

## 2021-03-14 NOTE — Progress Notes (Signed)
PROGRESS NOTE    Lynn Simpson  SEG:[id number] DOB: 29-Oct-1970 DOA: 03/10/2021 PCP: Haywood Pao, MD  Chief Complaint  Patient presents with   Emesis    Brief Narrative:  Lynn Simpson is Lynn Simpson 50 y.o. female with Jaquis Picklesimer histoy of Lynch syndrome. She presented secondary to RUQ pain and acholic stools with evidence of cholecystitis and choledocholithiasis on imaging. Plan for ERCP followed by cholecystectomy.    Assessment & Plan:   Principal Problem:   Choledocholithiasis with acute cholecystitis Active Problems:   Elevated LFTs   * Choledocholithiasis with acute cholecystitis- (present on admission) MRCP 12/22 with cholelithiasis without cholecystitis and choledocholithiasis with 3 small calculi up to 3 mm in diameter within distal common duct - mild superimposed extrahepatic biliary ductal dilatation S/p incomplete ERCP on 12/23 S/p lap cholecystectomy with IOC on 12/24  Intraoperative cholangiogram with distal CBD obstructing stone and biliary dilatation Appreciate GI recs, planning for IR consult, perc biliary drain Monday morning NPO after midnight 12/25 Abx  Elevated LFTs 2/2 choledocholithiasis Negative acute hepatitis panel   DVT prophylaxis: SCD Code Status: full Family Communication: none at bedside Disposition:   Status is: Inpatient  Remains inpatient appropriate because: need for IR c/s       Consultants:  GI surgery  Procedures:  ERCP Lap chole with IOC  Antimicrobials:  Anti-infectives (From admission, onward)    Start     Dose/Rate Route Frequency Ordered Stop   03/13/21 0809  piperacillin-tazobactam (ZOSYN) 3.375 GM/50ML IVPB       Note to Pharmacy: Christell Faith L: cabinet override      03/13/21 0809 03/13/21 0849   03/12/21 1245  ceFAZolin (ANCEF) IVPB 2g/100 mL premix  Status:  Discontinued        2 g 200 mL/hr over 30 Minutes Intravenous On call to O.R. 03/12/21 1218 03/12/21 1838   03/11/21 0000  piperacillin-tazobactam (ZOSYN)  IVPB 3.375 g        3.375 g 12.5 mL/hr over 240 Minutes Intravenous Every 8 hours 03/10/21 2336     03/10/21 1345  piperacillin-tazobactam (ZOSYN) IVPB 3.375 g        3.375 g 100 mL/hr over 30 Minutes Intravenous  Once 03/10/21 1330 03/10/21 1635       Subjective: No new complaints  Objective: Vitals:   03/13/21 1852 03/13/21 2337 03/14/21 0507 03/14/21 1244  BP: (!) 148/104 (!) 122/94 122/69 (!) 106/56  Pulse: (!) 103 83 83 74  Resp: 18 18 20 20   Temp: 97.7 F (36.5 C) 98.7 F (37.1 C) 98.1 F (36.7 C) 98 F (36.7 C)  TempSrc:  Oral Oral Oral  SpO2: 96% 95% 97% 96%  Weight:      Height:        Intake/Output Summary (Last 24 hours) at 03/14/2021 1416 Last data filed at 03/13/2021 2300 Gross per 24 hour  Intake 839.37 ml  Output --  Net 839.37 ml   Filed Weights   03/10/21 0949  Weight: 115.7 kg    Examination:  General: No acute distress. Lungs: unlabored Neuro: no focal neuro def appreciated Skin: Warm and dry. No rashes or lesions. Extremities: No clubbing or cyanosis. No edema.     Data Reviewed: I have personally reviewed following labs and imaging studies  CBC: Recent Labs  Lab 03/10/21 1021 03/11/21 0554 03/12/21 0540  WBC 6.2 6.3 5.9  NEUTROABS 3.6 3.2  --   HGB 15.3* 14.4 13.5  HCT 45.3 42.8 39.8  MCV 89.7 89.9 90.9  PLT 180 166 902    Basic Metabolic Panel: Recent Labs  Lab 03/10/21 1021 03/11/21 0554 03/12/21 0540 03/13/21 0518 03/14/21 0600  NA 137 139 139 138 137  K 3.6 3.3* 3.6 4.0 3.7  CL 101 105 106 104 109  CO2 27 24 26 25 24   GLUCOSE 99 75 92 109* 103*  BUN 12 11 8 8 12   CREATININE 0.52 0.67 0.74 0.58 0.78  CALCIUM 8.9 8.7* 8.7* 9.4 8.4*    GFR: Estimated Creatinine Clearance: 103.6 mL/min (by C-G formula based on SCr of 0.78 mg/dL).  Liver Function Tests: Recent Labs  Lab 03/10/21 1021 03/11/21 0554 03/12/21 0540 03/13/21 0518 03/14/21 0600  AST 604* 353* 244* 272* 391*  ALT 888* 650* 533* 518* 601*   ALKPHOS 207* 188* 196* 248* 268*  BILITOT 2.8* 3.4* 2.5* 2.7* 2.1*  PROT 7.7 7.2 6.5 7.4 7.2  ALBUMIN 4.1 4.0 3.6 3.9 3.9    CBG: No results for input(s): GLUCAP in the last 168 hours.   Recent Results (from the past 240 hour(s))  Resp Panel by RT-PCR (Flu Lynde Ludwig&B, Covid) Nasopharyngeal Swab     Status: None   Collection Time: 03/10/21 10:06 AM   Specimen: Nasopharyngeal Swab; Nasopharyngeal(NP) swabs in vial transport medium  Result Value Ref Range Status   SARS Coronavirus 2 by RT PCR NEGATIVE NEGATIVE Final    Comment: (NOTE) SARS-CoV-2 target nucleic acids are NOT DETECTED.  The SARS-CoV-2 RNA is generally detectable in upper respiratory specimens during the acute phase of infection. The lowest concentration of SARS-CoV-2 viral copies this assay can detect is 138 copies/mL. Kimyatta Lecy negative result does not preclude SARS-Cov-2 infection and should not be used as the sole basis for treatment or other patient management decisions. Max Nuno negative result may occur with  improper specimen collection/handling, submission of specimen other than nasopharyngeal swab, presence of viral mutation(s) within the areas targeted by this assay, and inadequate number of viral copies(<138 copies/mL). Mailey Landstrom negative result must be combined with clinical observations, patient history, and epidemiological information. The expected result is Negative.  Fact Sheet for Patients:  EntrepreneurPulse.com.au  Fact Sheet for Healthcare Providers:  IncredibleEmployment.be  This test is no t yet approved or cleared by the [place] FDA and  has been authorized for detection and/or diagnosis of SARS-CoV-2 by FDA under an Emergency Use Authorization (EUA). This EUA will remain  in effect (meaning this test can be used) for the duration of the COVID-19 declaration under Section 564(b)(1) of the Act, 21 U.S.C.section 360bbb-3(b)(1), unless the authorization is terminated  or revoked  sooner.       Influenza Marilu Rylander by PCR NEGATIVE NEGATIVE Final   Influenza B by PCR NEGATIVE NEGATIVE Final    Comment: (NOTE) The Xpert Xpress SARS-CoV-2/FLU/RSV plus assay is intended as an aid in the diagnosis of influenza from Nasopharyngeal swab specimens and should not be used as Atha Mcbain sole basis for treatment. Nasal washings and aspirates are unacceptable for Xpert Xpress SARS-CoV-2/FLU/RSV testing.  Fact Sheet for Patients: EntrepreneurPulse.com.au  Fact Sheet for Healthcare Providers: IncredibleEmployment.be  This test is not yet approved or cleared by the [place] FDA and has been authorized for detection and/or diagnosis of SARS-CoV-2 by FDA under an Emergency Use Authorization (EUA). This EUA will remain in effect (meaning this test can be used) for the duration of the COVID-19 declaration under Section 564(b)(1) of the Act, 21 U.S.C. section 360bbb-3(b)(1), unless the authorization is terminated or revoked.  Performed at [facility], [address] [place], [place] [address]   Culture, blood (routine x 2)     Status: None (Preliminary result)   Collection Time: 03/10/21  1:49 PM   Specimen: BLOOD  Result Value Ref Range Status   Specimen Description BLOOD LEFT ANTECUBITAL  Final   Special Requests   Final    BOTTLES DRAWN AEROBIC ONLY Blood Culture adequate volume   Culture   Final    NO GROWTH 3 DAYS Performed at [facility], [place] [address], [place], [place] [address]    Report Status PENDING  Incomplete  Culture, blood (routine x 2)     Status: None (Preliminary result)   Collection Time: 03/10/21  2:00 PM   Specimen: BLOOD  Result Value Ref Range Status   Specimen Description   Final    BLOOD Blood Culture adequate volume Performed at [facility], [address] [place], [place] [address]    Special Requests   Final    BOTTLES DRAWN AEROBIC AND ANAEROBIC BLOOD LEFT HAND Performed at [facility], [address] [place], [place] [address]    Culture   Final    NO GROWTH 3 DAYS Performed at [facility], [place] [address] [place], [place] [address]    Report Status PENDING  Incomplete         Radiology Studies: DG Cholangiogram Operative  Result Date: 03/13/2021 CLINICAL DATA:  Acute calculus cholecystitis EXAM: INTRAOPERATIVE CHOLANGIOGRAM TECHNIQUE: Cholangiographic images from the C-arm fluoroscopic device were submitted for interpretation post-operatively. Please see the procedural report for the amount of contrast and the fluoroscopy time utilized. COMPARISON:  03/11/2021 FINDINGS: Intraoperative cholangiogram performed during laparoscopic procedure. There is diffuse biliary tree dilatation extending to the distal CBD. Small distal CBD filling defect suspicious for obstructing choledocholithiasis. Contrast does not drain easily into the duodenum. IMPRESSION: Distal CBD obstructing stone and biliary dilatation. Electronically Signed   By: Jerilynn Mages.  Shick M.D.   On: 03/13/2021 10:13   DG ERCP WITH SPHINCTEROTOMY  Result Date: 03/12/2021 CLINICAL DATA:  Choledocholithiasis with acute cholecystitis EXAM: ERCP TECHNIQUE: Multiple spot images obtained with the fluoroscopic device and submitted for interpretation post-procedure. FLUOROSCOPY TIME:  Fluoroscopy Time:  4 minutes 4 seconds Number of Acquired Spot Images: 0 COMPARISON:  MRCP 03/11/2021 FINDINGS: Four intraoperative saved images are submitted for review demonstrating Minnie Shi flexible duodenal scope in the descending duodenum with wire cannulation of the common bile duct and placement of Duron Meister biliary stent. IMPRESSION: ERCP as above. These images were submitted for radiologic interpretation only. Please see the procedural report for the amount of contrast and the fluoroscopy time utilized. Electronically Signed   By: Jacqulynn Cadet M.D.   On: 03/12/2021 16:43   DG C-Arm 1-60 Min-No Report  Result Date:  03/12/2021 Fluoroscopy was utilized by the requesting physician.  No radiographic interpretation.        Scheduled Meds:  indomethacin  100 mg Rectal Once   pantoprazole  20 mg Oral Daily   Continuous Infusions:  sodium chloride 75 mL/hr at 03/14/21 8182   lactated ringers 10 mL/hr at 03/13/21 0827   piperacillin-tazobactam (ZOSYN)  IV 3.375 g (03/14/21 0807)     LOS: 4 days    Time spent: over 26 mni    Fayrene Helper, MD [facility]   To contact the attending provider between 7A-7P or the covering provider during after hours 7P-7A, please log into the web site [email or web address] and access using universal  [place] password for that web site. If you do not have the password, please call the hospital operator.  03/14/2021, 2:16 PM

## 2021-03-14 NOTE — Consult Note (Signed)
Chief Complaint: Patient was seen in consultation today for internal/external biliary drain placement.  Referring Physician(s): Carl Best  Supervising Physician: Sandi Mariscal  Patient Status: [facility] - In-pt  History of Present Illness: Lynn Simpson is a 50 y.o. female with a past medical history significant for anxiety, depression, GERD, IBS, Lynch syndrome who presented to [place] ED 03/10/21 with complaints of LUQ pain, n/v, cough and white stools for several days. Initial evaluation noted elevated LFTs and cholelithiasis with positive sonographic Murphy sign concerning for acute cholecystitis with dilated CBD concerning for biliary obstruction. She was transferred to [facility] and underwent MRCP which identified cholelithiasis w/o evidence of acute cholecystitis with choledocholithiasis with at least 3 small calculi within the distal CBD. She underwent ERCP for stone extraction on 12/23 however the bile duct was unable to be cannulated. She then underwent laparoscopic cholecystectomy with intraoperative cholangiogram on 12/24 however the CBD stone was unable to be dislodged during that procedure. IR has been consulted for internal/external biliary drain placement for biliary decompression and possible conduit outpatient ERCP in the future.  Past Medical History:  Diagnosis Date   Allergy    Anxiety    Asthma    Back pain    Cancer ([place]) 2018   Left Breast   COVID-19 virus infection 11/2019   Depression    Dyspnea    Family history of breast cancer    Family history of melanoma    Family history of ovarian cancer    Gallstones    GERD (gastroesophageal reflux disease)    IBS (irritable bowel syndrome)    Lower extremity edema    Lynch syndrome    Lynch syndrome    Migraine    Obesity    Ovarian cyst    Vitamin D deficiency     Past Surgical History:  Procedure Laterality Date   ANKLE FRACTURE SURGERY Right 12/2018   BREAST RECONSTRUCTION WITH PLACEMENT OF  TISSUE EXPANDER AND FLEX HD (ACELLULAR HYDRATED DERMIS) Bilateral 03/31/2016   Procedure: BILATERAL BREAST RECONSTRUCTION WITH PLACEMENT OF TISSUE EXPANDER AND FLEX HD (ACELLULAR HYDRATED DERMIS);  Surgeon: Wallace Going, DO;  Location: [place];  Service: Plastics;  Laterality: Bilateral;   CESAREAN SECTION  2005   CHOLECYSTECTOMY N/A 03/13/2021   Procedure: LAPAROSCOPIC CHOLECYSTECTOMY WITH  INTRAOPERATIVE CHOLANGIOGRAM;  Surgeon: Erroll Luna, MD;  Location: [facility];  Service: General;  Laterality: N/A;   COLONOSCOPY     LAPAROSCOPIC VAGINAL HYSTERECTOMY WITH SALPINGO OOPHORECTOMY Bilateral 09/15/2016   Procedure: LAPAROSCOPIC ASSISTED VAGINAL HYSTERECTOMY WITH BILATERAL SALPINGO OOPHORECTOMY;  Surgeon: Molli Posey, MD;  Location: [place] [facility];  Service: Gynecology;  Laterality: Bilateral;   LIPOSUCTION Bilateral 06/30/2016   Procedure: LIPOSUCTION TO LATERAL CHEST;  Surgeon: Wallace Going, DO;  Location: [place];  Service: Plastics;  Laterality: Bilateral;   NIPPLE SPARING MASTECTOMY Bilateral 03/31/2016   Procedure: BILATERAL NIPPLE SPARING MASTECTOMIES;  Surgeon: Autumn Messing III, MD;  Location: [place];  Service: General;  Laterality: Bilateral;   POLYPECTOMY     REMOVAL OF BILATERAL TISSUE EXPANDERS WITH PLACEMENT OF BILATERAL BREAST IMPLANTS Bilateral 06/30/2016   Procedure: REMOVAL OF BILATERAL TISSUE EXPANDERS WITH PLACEMENT OF BILATERAL SILCONE BREAST IMPLANTS;  Surgeon: Wallace Going, DO;  Location: [place];  Service: Plastics;  Laterality: Bilateral;   WISDOM TOOTH EXTRACTION      Allergies: Bee venom and Wellbutrin [bupropion]  Medications: Prior to Admission medications   Medication Sig Start Date End  Date Taking? Authorizing Provider  calcium carbonate (TUMS EX) 750 MG chewable tablet Chew 1 tablet by mouth daily as needed for heartburn.   Yes [provider]  celecoxib (CELEBREX) 200  MG capsule Take 200 mg by mouth daily as needed for moderate pain or mild pain. 12/05/19  Yes [provider]  Doxylamine-DM (VICKS DAYQUIL/NYQUIL COUGH PO) Take 30 mLs by mouth every 4 (four) hours as needed (cough).   Yes [provider]  famotidine (PEPCID) 40 MG tablet Take 1 tablet (40 mg total) by mouth daily. 02/03/21  Yes Kennedy-Smith, Patrecia Pour, NP  hyoscyamine (LEVSIN SL) 0.125 MG SL tablet Place 1 tablet (0.125 mg total) under the tongue every 6 (six) hours as needed. 12/31/20  Yes Noralyn Pick, NP  ondansetron (ZOFRAN-ODT) 8 MG disintegrating tablet Dissolve one tablet on the tongue every 8 hours as needed for nausea and vomiting. Patient taking differently: Take 8 mg by mouth every 8 (eight) hours as needed for nausea or vomiting. 02/03/21  Yes Noralyn Pick, NP  promethazine (PHENERGAN) 12.5 MG tablet Take 1 tablet (12.5 mg total) by mouth 2 (two) times daily as needed for nausea or vomiting. 02/03/21  Yes Noralyn Pick, NP  Sod Picosulfate-Mag Ox-Cit [facility]) 10-3.5-12 MG-GM -GM/160ML SOLN Take as split dose prep following the doctor's instructions 02/10/21  Yes Noralyn Pick, NP  albuterol (VENTOLIN HFA) 108 (90 Base) MCG/ACT inhaler Inhale 2 puffs into the lungs every 4 (four) hours as needed for shortness of breath. Patient not taking: Reported on 03/11/2021 03/09/21   [provider]  hydrOXYzine (ATARAX) 10 MG tablet Take 10 mg by mouth. Patient not taking: Reported on 03/11/2021 03/09/21   [provider]     Family History  Problem Relation Age of Onset   Breast cancer Mother 5   Liver cancer Mother    Alcoholism Mother    Lung cancer Father 20   Breast cancer Sister 41   Breast cancer Maternal Grandmother    Ovarian cancer Maternal Grandmother    Colon cancer Maternal Grandmother    Cancer Paternal Grandfather        NOS   Melanoma Maternal Aunt        mother's maternal half sister    Colon polyps Neg Hx    Rectal cancer Neg Hx    Stomach cancer Neg Hx    Esophageal cancer Neg Hx     Social History   Socioeconomic History   Marital status: Married    Spouse name: Heran Campau   Number of children: 3   Years of education: Not on file   Highest education level: Not on file  Occupational History   Occupation: Sales  Tobacco Use   Smoking status: Every Day    Packs/day: 0.50    Years: 20.00    Pack years: 10.00    Types: Cigarettes    Last attempt to quit: 12/20/2015    Years since quitting: 5.2   Smokeless tobacco: Never   Tobacco comments:    form given [id number]  Vaping Use   Vaping Use: Never used  Substance and Sexual Activity   Alcohol use: Yes    Comment: occ   Drug use: No   Sexual activity: Yes    Birth control/protection: I.U.D., Post-menopausal    Comment: IUD REMOVED 09/07/16  Other Topics Concern   Not on file  Social History Narrative   Not on file   Social Determinants of Health  Financial Resource Strain: Not on file  Food Insecurity: Not on file  Transportation Needs: Not on file  Physical Activity: Not on file  Stress: Not on file  Social Connections: Not on file     Review of Systems: A 12 point ROS discussed and pertinent positives are indicated in the HPI above.  All other systems are negative.  Review of Systems  Constitutional:  Negative for chills and fever.  Respiratory:  Negative for cough and shortness of breath.   Cardiovascular:  Negative for chest pain.  Gastrointestinal:  Positive for nausea. Negative for abdominal pain and vomiting.  Musculoskeletal:  Negative for back pain.  Neurological:  Negative for headaches.   Vital Signs: BP 122/69    Pulse 83    Temp 98.1 F (36.7 C) (Oral)    Resp 20    Ht 5\' 2"  (1.575 m)    Wt 255 lb (115.7 kg)    LMP 09/07/2016 Comment: IUD REMOVED 09/07/16   SpO2 97%    BMI 46.64 kg/m   Physical Exam Vitals and nursing note reviewed.  Constitutional:      General: She is  not in acute distress. HENT:     Head: Normocephalic.     Mouth/Throat:     Mouth: Mucous membranes are moist.     Pharynx: Oropharynx is clear. No oropharyngeal exudate or posterior oropharyngeal erythema.  Cardiovascular:     Rate and Rhythm: Normal rate and regular rhythm.  Pulmonary:     Effort: Pulmonary effort is normal.     Breath sounds: Normal breath sounds.  Abdominal:     Palpations: Abdomen is soft.  Skin:    General: Skin is warm and dry.  Neurological:     Mental Status: She is alert and oriented to person, place, and time.  Psychiatric:        Mood and Affect: Mood normal.        Behavior: Behavior normal.        Thought Content: Thought content normal.        Judgment: Judgment normal.     MD Evaluation Airway: WNL Heart: WNL Abdomen: WNL Chest/ Lungs: WNL ASA  Classification: 2 Mallampati/Airway Score: Two   Imaging: DG Cholangiogram Operative  Result Date: 03/13/2021 CLINICAL DATA:  Acute calculus cholecystitis EXAM: INTRAOPERATIVE CHOLANGIOGRAM TECHNIQUE: Cholangiographic images from the C-arm fluoroscopic device were submitted for interpretation post-operatively. Please see the procedural report for the amount of contrast and the fluoroscopy time utilized. COMPARISON:  03/11/2021 FINDINGS: Intraoperative cholangiogram performed during laparoscopic procedure. There is diffuse biliary tree dilatation extending to the distal CBD. Small distal CBD filling defect suspicious for obstructing choledocholithiasis. Contrast does not drain easily into the duodenum. IMPRESSION: Distal CBD obstructing stone and biliary dilatation. Electronically Signed   By: Jerilynn Mages.  Shick M.D.   On: 03/13/2021 10:13   MR 3D Recon At Scanner  Result Date: 03/11/2021 CLINICAL DATA:  Choledocholithiasis, acute cholecystitis EXAM: MRI ABDOMEN WITHOUT AND WITH CONTRAST (INCLUDING MRCP) TECHNIQUE: Multiplanar multisequence MR imaging of the abdomen was performed both before and after the  administration of intravenous contrast. Heavily T2-weighted images of the biliary and pancreatic ducts were obtained, and three-dimensional MRCP images were rendered by post processing. CONTRAST:  58mL GADAVIST GADOBUTROL 1 MMOL/ML IV SOLN COMPARISON:  Sonogram 03/10/2021 FINDINGS: Lower chest: Bilateral breast implants are visualized. Cardiac size within normal limits. Hepatobiliary: Normal hepatic parenchymal signal intensity. No focal intrahepatic masses are identified. No intrahepatic biliary ductal dilation. The extrahepatic bile duct is dilated  measuring 11 mm in diameter. There are at least 3 intraluminal filling defects identified within the distal common duct in keeping with small stones measuring up to 3 mm in greatest dimension. Multiple layering gallstones are seen within the gallbladder along with a small amount of sludge. The gallbladder is mildly distended. No pericholecystic inflammatory stranding or gallbladder hydrops is identified to suggest superimposed acute cholecystitis. Pancreas: No mass, inflammatory changes, or other parenchymal abnormality identified. No divisum Spleen:  Within normal limits in size and appearance. Adrenals/Urinary Tract: The adrenal glands are unremarkable. The kidneys are normal in size and position. No hydronephrosis. Simple cortical cyst noted within the mid to upper pole the left kidney. Stomach/Bowel: Visualized portions within the abdomen are unremarkable. Vascular/Lymphatic: No pathologically enlarged lymph nodes identified. No abdominal aortic aneurysm demonstrated. Other:  None. Musculoskeletal: No suspicious bone lesions identified. IMPRESSION: Cholelithiasis without superimposed inflammatory change to suggest acute cholecystitis. Choledocholithiasis with at least 3 small calculi measuring up to 3 mm in diameter within the distal common duct. Mild superimposed extrahepatic biliary ductal dilation. Electronically Signed   By: Fidela Salisbury M.D.   On: 03/11/2021  01:00   DG ERCP WITH SPHINCTEROTOMY  Result Date: 03/12/2021 CLINICAL DATA:  Choledocholithiasis with acute cholecystitis EXAM: ERCP TECHNIQUE: Multiple spot images obtained with the fluoroscopic device and submitted for interpretation post-procedure. FLUOROSCOPY TIME:  Fluoroscopy Time:  4 minutes 4 seconds Number of Acquired Spot Images: 0 COMPARISON:  MRCP 03/11/2021 FINDINGS: Four intraoperative saved images are submitted for review demonstrating a flexible duodenal scope in the descending duodenum with wire cannulation of the common bile duct and placement of a biliary stent. IMPRESSION: ERCP as above. These images were submitted for radiologic interpretation only. Please see the procedural report for the amount of contrast and the fluoroscopy time utilized. Electronically Signed   By: Jacqulynn Cadet M.D.   On: 03/12/2021 16:43   DG C-Arm 1-60 Min-No Report  Result Date: 03/12/2021 Fluoroscopy was utilized by the requesting physician.  No radiographic interpretation.   MR ABDOMEN MRCP W WO CONTAST  Result Date: 03/11/2021 CLINICAL DATA:  Choledocholithiasis, acute cholecystitis EXAM: MRI ABDOMEN WITHOUT AND WITH CONTRAST (INCLUDING MRCP) TECHNIQUE: Multiplanar multisequence MR imaging of the abdomen was performed both before and after the administration of intravenous contrast. Heavily T2-weighted images of the biliary and pancreatic ducts were obtained, and three-dimensional MRCP images were rendered by post processing. CONTRAST:  51mL GADAVIST GADOBUTROL 1 MMOL/ML IV SOLN COMPARISON:  Sonogram 03/10/2021 FINDINGS: Lower chest: Bilateral breast implants are visualized. Cardiac size within normal limits. Hepatobiliary: Normal hepatic parenchymal signal intensity. No focal intrahepatic masses are identified. No intrahepatic biliary ductal dilation. The extrahepatic bile duct is dilated measuring 11 mm in diameter. There are at least 3 intraluminal filling defects identified within the distal  common duct in keeping with small stones measuring up to 3 mm in greatest dimension. Multiple layering gallstones are seen within the gallbladder along with a small amount of sludge. The gallbladder is mildly distended. No pericholecystic inflammatory stranding or gallbladder hydrops is identified to suggest superimposed acute cholecystitis. Pancreas: No mass, inflammatory changes, or other parenchymal abnormality identified. No divisum Spleen:  Within normal limits in size and appearance. Adrenals/Urinary Tract: The adrenal glands are unremarkable. The kidneys are normal in size and position. No hydronephrosis. Simple cortical cyst noted within the mid to upper pole the left kidney. Stomach/Bowel: Visualized portions within the abdomen are unremarkable. Vascular/Lymphatic: No pathologically enlarged lymph nodes identified. No abdominal aortic aneurysm demonstrated. Other:  None.  Musculoskeletal: No suspicious bone lesions identified. IMPRESSION: Cholelithiasis without superimposed inflammatory change to suggest acute cholecystitis. Choledocholithiasis with at least 3 small calculi measuring up to 3 mm in diameter within the distal common duct. Mild superimposed extrahepatic biliary ductal dilation. Electronically Signed   By: Fidela Salisbury M.D.   On: 03/11/2021 01:00   US Abdomen Limited RUQ (LIVER/GB)  Result Date: 03/10/2021 CLINICAL DATA:  Right upper quadrant pain EXAM: ULTRASOUND ABDOMEN LIMITED RIGHT UPPER QUADRANT COMPARISON:  Abdominal ultrasound 07/28/2017 FINDINGS: Gallbladder: A few dependent echogenic shadowing calculi measuring up to 1 cm in size. No significant wall thickening or pericholecystic fluid visualized. Positive sonographic Murphy's sign per the technologist. Common bile duct: Diameter: 10 mm Liver: Coarse, increased echogenicity of the parenchyma with no focal mass identified. Portal vein is patent on color Doppler imaging with normal direction of blood flow towards the liver. Other:  None. IMPRESSION: 1. Cholelithiasis with positive sonographic Murphy's sign per the technologist, suggesting acute calculus cholecystitis. 2. Dilated common bile duct, correlate for evidence of obstruction and consider MRCP if indicated. 3. Evidence of hepatic steatosis. Electronically Signed   By: Ofilia Neas M.D.   On: 03/10/2021 12:11    Labs:  CBC: Recent Labs    12/31/20 1436 03/10/21 1021 03/11/21 0554 03/12/21 0540  WBC 9.7 6.2 6.3 5.9  HGB 15.5* 15.3* 14.4 13.5  HCT 45.0 45.3 42.8 39.8  PLT 202.0 180 166 152    COAGS: Recent Labs    03/10/21 1211  INR 1.0  APTT 27    BMP: Recent Labs    03/11/21 0554 03/12/21 0540 03/13/21 0518 03/14/21 0600  NA 139 139 138 137  K 3.3* 3.6 4.0 3.7  CL 105 106 104 109  CO2 24 26 25 24   GLUCOSE 75 92 109* 103*  BUN 11 8 8 12   CALCIUM 8.7* 8.7* 9.4 8.4*  CREATININE 0.67 0.74 0.58 0.78  GFRNONAA >60 >60 >60 >60    LIVER FUNCTION TESTS: Recent Labs    03/11/21 0554 03/12/21 0540 03/13/21 0518 03/14/21 0600  BILITOT 3.4* 2.5* 2.7* 2.1*  AST 353* 244* 272* 391*  ALT 650* 533* 518* 601*  ALKPHOS 188* 196* 248* 268*  PROT 7.2 6.5 7.4 7.2  ALBUMIN 4.0 3.6 3.9 3.9    TUMOR MARKERS: No results for input(s): AFPTM, CEA, CA199, CHROMGRNA in the last 8760 hours.  Assessment and Plan:  50 y/o F with history of acute cholecystitis and choledocholithiasis with CBD stone causing biliary obstruction seen today for internal/external biliary drain placement. Patient underwent failed ERCP 12/23, laparoscopic cholecystectomy 12/24 with failed CBD stone extraction, as such IR as been consulted for internal/external biliary drain placement for biliary decompression and possible conduit for ERCP in the future.  Patient history and imaging reviewed by Dr. Pascal Lux who approves procedure for 12/26. Patient to be NPO at midnight 12/26, AM labs, hold anticoagulation until post procedure.  Risks and benefits of internal/external biliary  drain placement discussed with the patient including, but not limited to bleeding, infection which may lead to sepsis or even death and damage to adjacent structures.  This interventional procedure involves the use of X-rays and because of the nature of the planned procedure, it is possible that we will have prolonged use of X-ray fluoroscopy.  Potential radiation risks to you include (but are not limited to) the following: - A slightly elevated risk for cancer  several years later in life. This risk is typically less than 0.5% percent. This risk is low  in comparison to the normal incidence of human cancer, which is 33% for women and 50% for men according to the [place]. - Radiation induced injury can include skin redness, resembling a rash, tissue breakdown / ulcers and hair loss (which can be temporary or permanent).   The likelihood of either of these occurring depends on the difficulty of the procedure and whether you are sensitive to radiation due to previous procedures, disease, or genetic conditions.   IF your procedure requires a prolonged use of radiation, you will be notified and given written instructions for further action.  It is your responsibility to monitor the irradiated area for the 2 weeks following the procedure and to notify your physician if you are concerned that you have suffered a radiation induced injury.    Patient to sign consent in IR on 12/26 prior to procedure.  Thank you for this interesting consult.  I greatly enjoyed meeting Lynn Simpson and look forward to participating in their care.  A copy of this report was sent to the requesting provider on this date.  Electronically Signed: Joaquim Nam, PA-C 03/14/2021, 10:56 AM   I spent a total of 40 Minutes in face to face in clinical consultation, greater than 50% of which was counseling/coordinating care for internal/external biliary drain placement.

## 2021-03-14 NOTE — Progress Notes (Signed)
1 Day Post-Op   Subjective/Chief Complaint: Pt doing well Appreciate GI/IR help     Objective: Vital signs in last 24 hours: Temp:  [97.4 F (36.3 C)-98.7 F (37.1 C)] 98.1 F (36.7 C) (12/25 0507) Pulse Rate:  [71-103] 83 (12/25 0507) Resp:  [12-20] 20 (12/25 0507) BP: (122-165)/(66-104) 122/69 (12/25 0507) SpO2:  [95 %-100 %] 97 % (12/25 0507) Last BM Date: 03/13/21  Intake/Output from previous day: 12/24 0701 - 12/25 0700 In: 1958.3 [P.O.:240; I.V.:1599.4; IV Piggyback:119] Out: 1 [Urine:1] Intake/Output this shift: No intake/output data recorded.  Incision/Wound: Port sites CDI Sore but no peritonitis  Lab Results:  Recent Labs    03/12/21 0540  WBC 5.9  HGB 13.5  HCT 39.8  PLT 152   BMET Recent Labs    03/13/21 0518 03/14/21 0600  NA 138 137  K 4.0 3.7  CL 104 109  CO2 25 24  GLUCOSE 109* 103*  BUN 8 12  CREATININE 0.58 0.78  CALCIUM 9.4 8.4*   PT/INR No results for input(s): LABPROT, INR in the last 72 hours. ABG No results for input(s): PHART, HCO3 in the last 72 hours.  Invalid input(s): PCO2, PO2  Studies/Results: DG Cholangiogram Operative  Result Date: 03/13/2021 CLINICAL DATA:  Acute calculus cholecystitis EXAM: INTRAOPERATIVE CHOLANGIOGRAM TECHNIQUE: Cholangiographic images from the C-arm fluoroscopic device were submitted for interpretation post-operatively. Please see the procedural report for the amount of contrast and the fluoroscopy time utilized. COMPARISON:  03/11/2021 FINDINGS: Intraoperative cholangiogram performed during laparoscopic procedure. There is diffuse biliary tree dilatation extending to the distal CBD. Small distal CBD filling defect suspicious for obstructing choledocholithiasis. Contrast does not drain easily into the duodenum. IMPRESSION: Distal CBD obstructing stone and biliary dilatation. Electronically Signed   By: Jerilynn Mages.  Shick M.D.   On: 03/13/2021 10:13   DG ERCP WITH SPHINCTEROTOMY  Result Date:  03/12/2021 CLINICAL DATA:  Choledocholithiasis with acute cholecystitis EXAM: ERCP TECHNIQUE: Multiple spot images obtained with the fluoroscopic device and submitted for interpretation post-procedure. FLUOROSCOPY TIME:  Fluoroscopy Time:  4 minutes 4 seconds Number of Acquired Spot Images: 0 COMPARISON:  MRCP 03/11/2021 FINDINGS: Four intraoperative saved images are submitted for review demonstrating a flexible duodenal scope in the descending duodenum with wire cannulation of the common bile duct and placement of a biliary stent. IMPRESSION: ERCP as above. These images were submitted for radiologic interpretation only. Please see the procedural report for the amount of contrast and the fluoroscopy time utilized. Electronically Signed   By: Jacqulynn Cadet M.D.   On: 03/12/2021 16:43   DG C-Arm 1-60 Min-No Report  Result Date: 03/12/2021 Fluoroscopy was utilized by the requesting physician.  No radiographic interpretation.    Anti-infectives: Anti-infectives (From admission, onward)    Start     Dose/Rate Route Frequency Ordered Stop   03/13/21 0809  piperacillin-tazobactam (ZOSYN) 3.375 GM/50ML IVPB       Note to Pharmacy: Christell Faith L: cabinet override      03/13/21 0809 03/13/21 0849   03/12/21 1245  ceFAZolin (ANCEF) IVPB 2g/100 mL premix  Status:  Discontinued        2 g 200 mL/hr over 30 Minutes Intravenous On call to O.R. 03/12/21 1218 03/12/21 1838   03/11/21 0000  piperacillin-tazobactam (ZOSYN) IVPB 3.375 g        3.375 g 12.5 mL/hr over 240 Minutes Intravenous Every 8 hours 03/10/21 2336     03/10/21 1345  piperacillin-tazobactam (ZOSYN) IVPB 3.375 g        3.375  g 100 mL/hr over 30 Minutes Intravenous  Once 03/10/21 1330 03/10/21 1635       Assessment/Plan: s/p Procedure(s): LAPAROSCOPIC CHOLECYSTECTOMY WITH  INTRAOPERATIVE CHOLANGIOGRAM (N/A) Stable surgically  Plan for biliary drain  per IR and ERCP per GI at a later time   Can go home from surgery standpoint    LOS: 4 days    Lynn Simpson 03/14/2021

## 2021-03-15 ENCOUNTER — Inpatient Hospital Stay (HOSPITAL_COMMUNITY): Payer: BC Managed Care – PPO

## 2021-03-15 DIAGNOSIS — K8050 Calculus of bile duct without cholangitis or cholecystitis without obstruction: Secondary | ICD-10-CM | POA: Diagnosis not present

## 2021-03-15 DIAGNOSIS — Z9889 Other specified postprocedural states: Secondary | ICD-10-CM | POA: Diagnosis not present

## 2021-03-15 DIAGNOSIS — R7989 Other specified abnormal findings of blood chemistry: Secondary | ICD-10-CM | POA: Diagnosis not present

## 2021-03-15 HISTORY — PX: IR PERCUTANEOUS TRANSHEPATIC CHOLANGIOGRAM: IMG6042

## 2021-03-15 LAB — CBC
HCT: 40.1 % (ref 36.0–46.0)
Hemoglobin: 13.1 g/dL (ref 12.0–15.0)
MCH: 30.3 pg (ref 26.0–34.0)
MCHC: 32.7 g/dL (ref 30.0–36.0)
MCV: 92.8 fL (ref 80.0–100.0)
Platelets: 149 10*3/uL — ABNORMAL LOW (ref 150–400)
RBC: 4.32 MIL/uL (ref 3.87–5.11)
RDW: 13.1 % (ref 11.5–15.5)
WBC: 6.3 10*3/uL (ref 4.0–10.5)
nRBC: 0 % (ref 0.0–0.2)

## 2021-03-15 LAB — CULTURE, BLOOD (ROUTINE X 2)
Culture: NO GROWTH
Culture: NO GROWTH
Special Requests: ADEQUATE
Specimen Description: ADEQUATE

## 2021-03-15 LAB — COMPREHENSIVE METABOLIC PANEL
ALT: 611 U/L — ABNORMAL HIGH (ref 0–44)
AST: 376 U/L — ABNORMAL HIGH (ref 15–41)
Albumin: 3.5 g/dL (ref 3.5–5.0)
Alkaline Phosphatase: 276 U/L — ABNORMAL HIGH (ref 38–126)
Anion gap: 10 (ref 5–15)
BUN: 8 mg/dL (ref 6–20)
CO2: 23 mmol/L (ref 22–32)
Calcium: 8.6 mg/dL — ABNORMAL LOW (ref 8.9–10.3)
Chloride: 107 mmol/L (ref 98–111)
Creatinine, Ser: 0.81 mg/dL (ref 0.44–1.00)
GFR, Estimated: >60 mL/min (ref >60)
Glucose, Bld: 97 mg/dL (ref 70–99)
Potassium: 3.8 mmol/L (ref 3.5–5.1)
Sodium: 140 mmol/L (ref 135–145)
Total Bilirubin: 1.5 mg/dL — ABNORMAL HIGH (ref 0.3–1.2)
Total Protein: 6.5 g/dL (ref 6.5–8.1)

## 2021-03-15 LAB — PROTIME-INR
INR: 0.9 (ref 0.8–1.2)
Prothrombin Time: 12.2 seconds (ref 11.4–15.2)

## 2021-03-15 IMAGING — US IR CHOLANGIOGRAM PERCUTANEOUS TRANSHEPATIC
2 series · 7 of 7 positions shown · non-contrast
Comparison: MRCP-[DATE]

INDICATION: History of choledocholithiasis, post attempted though ultimately
unsuccessful ERCP. Patient subsequent underwent laparoscopic
cholecystectomy with intraoperative cholangiogram demonstrating an
occlusive stone within the distal aspect of the CBD.
TECHNIQUE: Informed written consent was obtained from the patient after a
discussion of the risks, benefits and alternatives to treatment.
Questions regarding the procedure were encouraged and answered. A
timeout was performed prior to the initiation of the procedure.

[Series 1: processed: ir biliary drain placement wi · 4 of 4 slices shown]
[im 1/4]
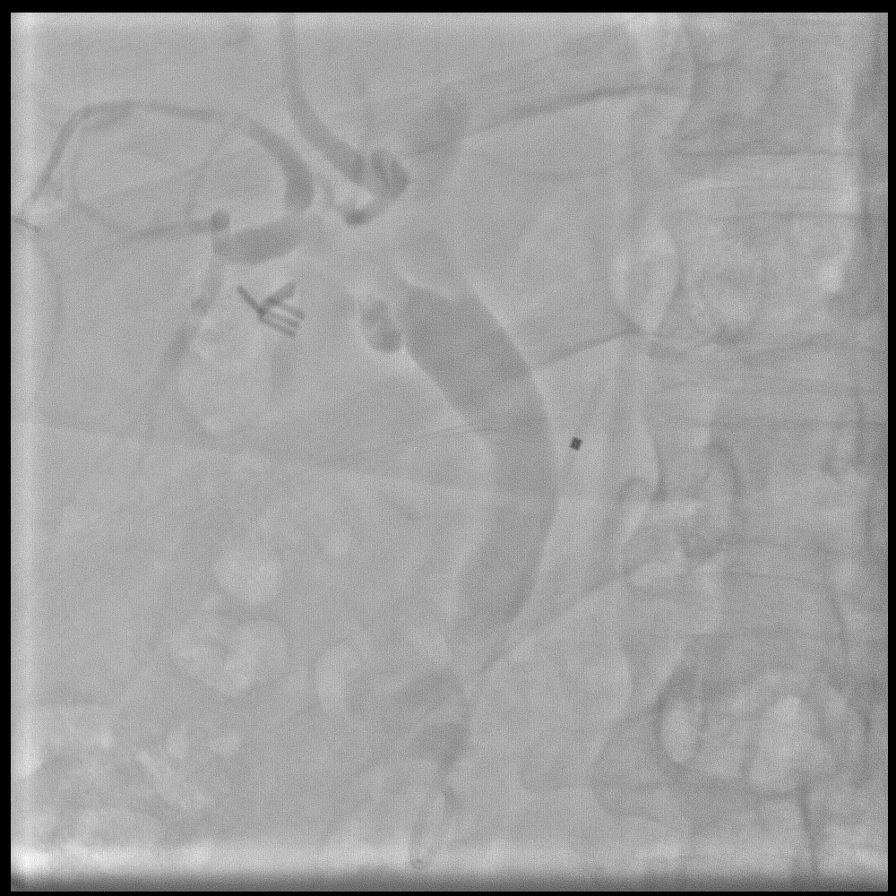
[im 2/4]
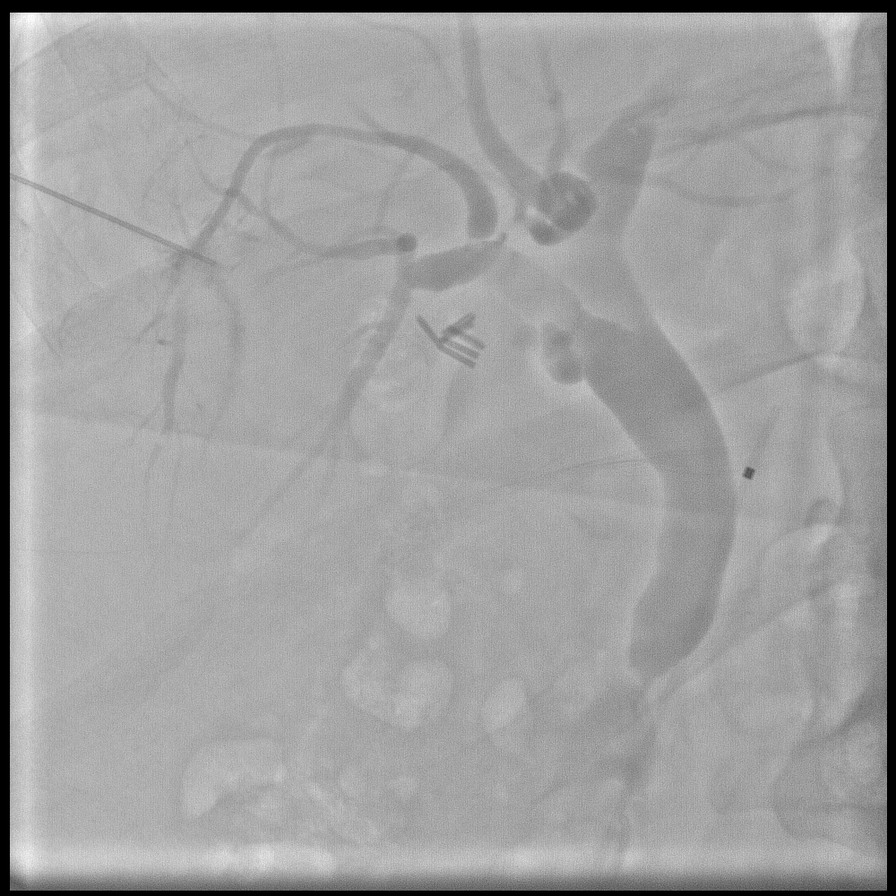
[im 3/4]
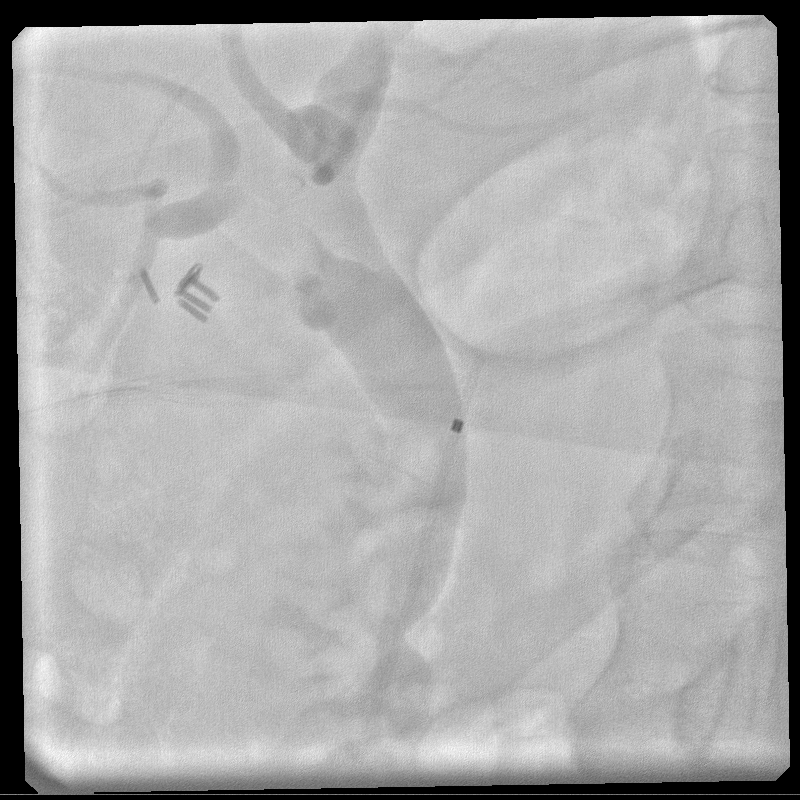
[im 4/4]
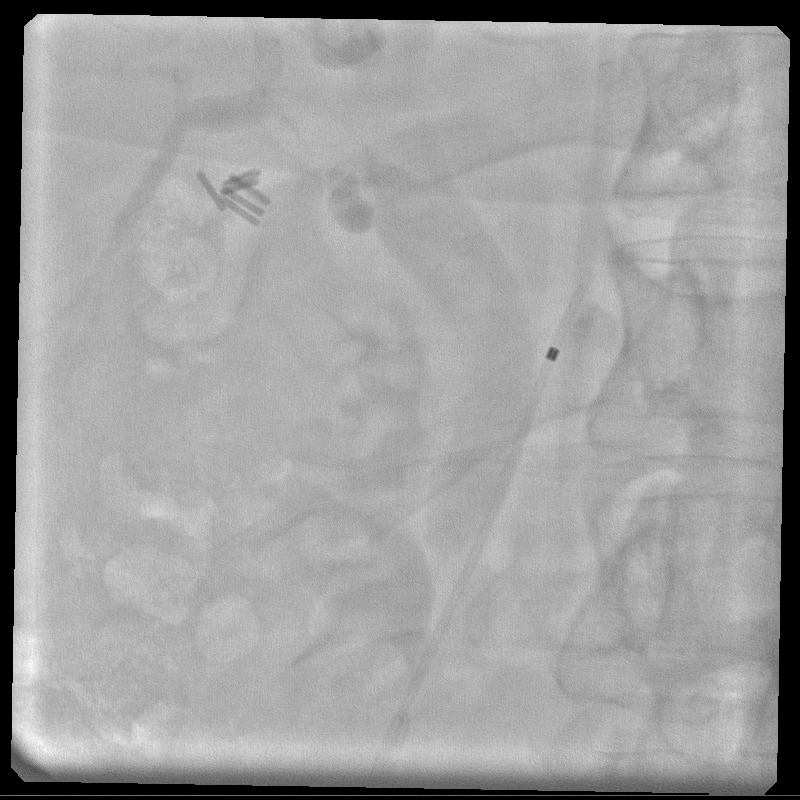

[Series 1: ir (id) (id)/(id) · 3 of 3 slices shown]
[im 1/3]
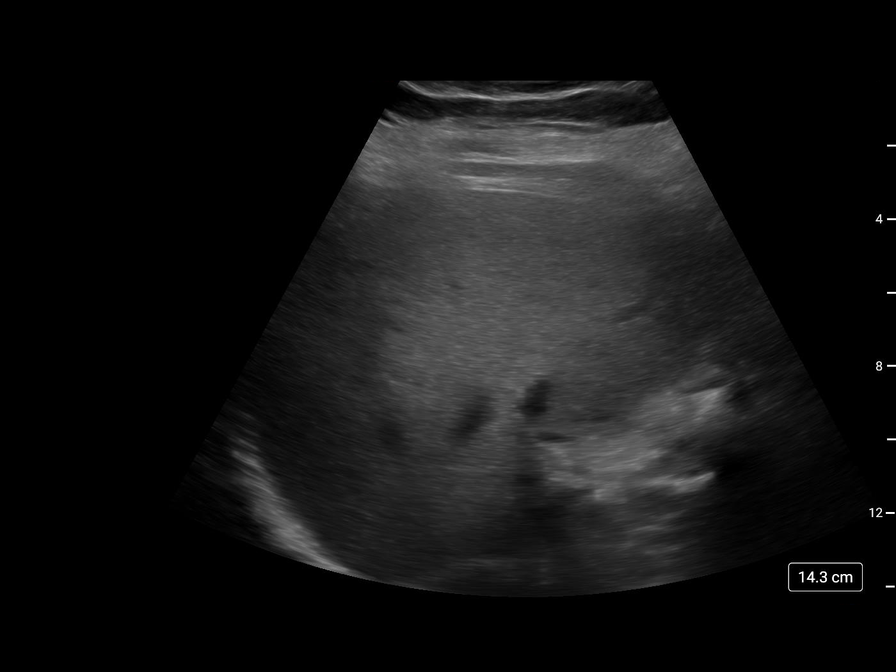
[im 2/3]
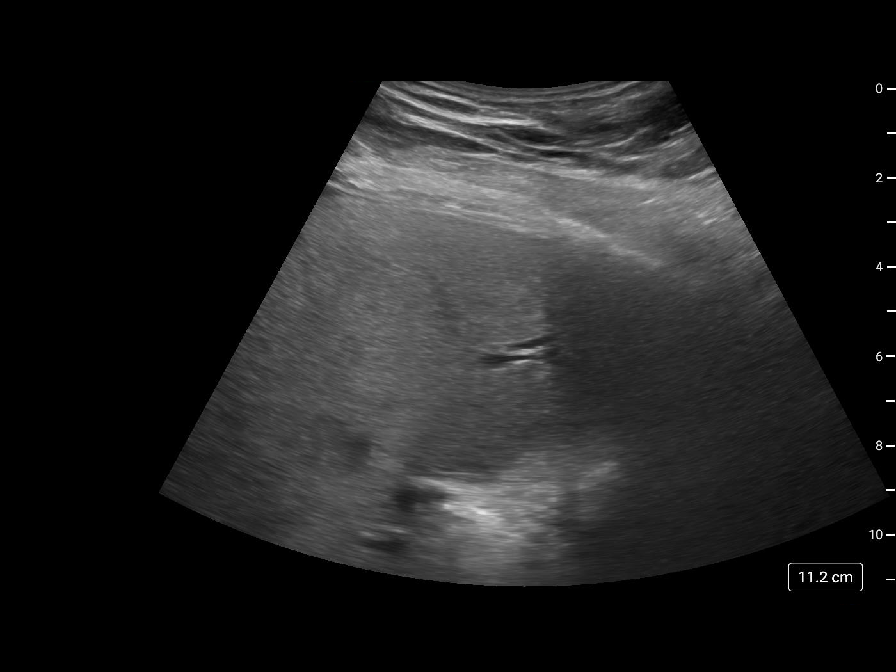
[im 3/3]
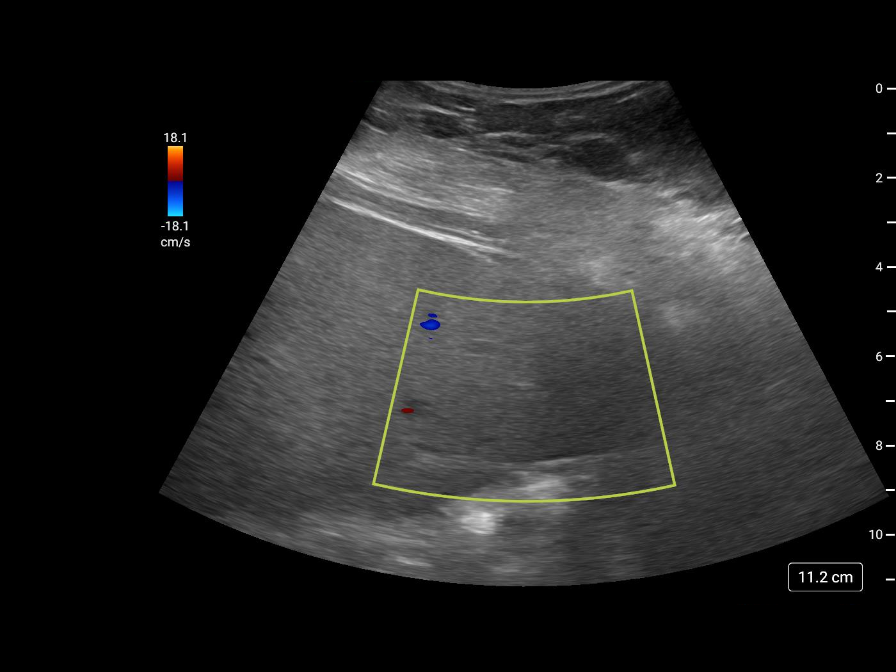

[7 of 7 positions shown; findings below may reference images not displayed]

As such patient presents for image guided placement an
internal/external biliary drainage catheter

Note, the patient's LFTs have normalized and she has no significant
intrahepatic biliary ductal dilatation on MRCP [DATE] however
Dr. BALIBEJ would like us to proceed with attempted
internal/external biliary drainage catheter placement.

EXAM:
ULTRASOUND AND FLUOROSCOPIC GUIDED TRANSHEPATIC CHOLANGIOGRAM
MEDICATIONS:
Patient is currently admitted to the hospital receiving intravenous
antibiotics; The antibiotic was administered with an appropriate
time frame prior to the initiation of the procedure

CONTRAST:  15 cc [EY] - administered into the biliary tree.

ANESTHESIA/SEDATION:
Moderate (conscious) sedation was employed during this procedure. A
total of Versed 8 mg, fentanyl 300 mcg and Dilaudid 1 mg was
administered intravenously.

Moderate Sedation Time: 70 minutes. The patient's level of
consciousness and vital signs were monitored continuously by
radiology nursing throughout the procedure under my direct
supervision.

FLUOROSCOPY TIME:  18.4 minutes ([EY] mGy)

COMPLICATIONS:
None immediate.
Sonographic evaluation performed redemonstrated no significant
intrahepatic biliary ductal dilatation. Additionally, there is poor
sonographic window of both the left and right lobe of the liver
secondary to patient body habitus.

The right upper abdominal quadrant and midline of the upper abdomen
was prepped and draped in the usual sterile fashion, and a sterile
drape was applied covering the operative field. Maximum barrier
sterile technique with sterile gowns and gloves were used for the
procedure. A timeout was performed prior to the initiation of the
procedure.

Under direct ultrasound guidance, efforts were made to cannulate a
nondilated duct within left lobe liver however this ultimately
proved unsuccessful.

Next, under ultrasound guidance, after which were made to cannulate
a nondilated duct within the peripheral aspect the right lobe liver

Contrast injection was able to opacify a peripheral nondilated
portion of the right intrahepatic biliary tree. Multiple spot
fluoroscopic and radiographic images were obtained in various
obliquities demonstrating patency of the CBD with passage of
contrast to the level duodenum and without a discrete residual
filling defect to suggest residual choledocholithiasis.

Prolonged efforts were made to cannulate the opacified right bile
duct however this ultimately proved unsuccessful. The opacified bile
duct was then targeted fluoroscopically however again, the duct
could not be cannulated due to lack of dilatation as well as patient
body habitus.

At this time, the decision was made to cc further attempts at
placement of an internal/external biliary drainage catheter.

The procedure was terminated. Dressings were applied. The patient
tolerated the procedure well without immediate postprocedural
complication though was expectedly uncomfortable at the time of
procedure completion.
FINDINGS: Sonographic evaluation demonstrates no significant intrahepatic
biliary ductal dilatation with poor sonographic window of both the
right and left lobes liver secondary to patient body habitus and
poor sonographic window.

Ultimately, under a combination of ultrasound and fluoroscopic
guidance, the peripheral aspect of a nondilated right intrahepatic
biliary duct was opacified with intraoperative cholangiogram
demonstrating restored patency of the CBD with passage of contrast
to the level duodenum and without a definitive residual filling
defect to suggest residual choledocholithiasis.

Despite prolonged efforts, the opacified biliary tree could not be
successfully cannulated secondary to lack of intrahepatic biliary
ductal dilatation.

Redemonstrated pancreatic stent.
IMPRESSION: 1. Attempted though ultimately unsuccessful placement of an
internal/external biliary drainage catheter secondary to lack of
intrahepatic biliary duct dilatation and patient body habitus.
2. Intra procedural cholangiogram demonstrates restored patency of
the CBD with passage of contrast to the level duodenum and without a
discrete residual filling defect to suggest residual
choledocholithiasis.

## 2021-03-15 MED ORDER — HYDROMORPHONE HCL 1 MG/ML IJ SOLN
1.0000 mg | Freq: Once | INTRAMUSCULAR | Status: AC
  Administered 2021-03-15: 18:00:00 1 mg via INTRAVENOUS

## 2021-03-15 MED ORDER — MIDAZOLAM HCL 2 MG/2ML IJ SOLN
INTRAMUSCULAR | Status: AC
  Filled 2021-03-15: qty 2

## 2021-03-15 MED ORDER — FENTANYL CITRATE (PF) 100 MCG/2ML IJ SOLN
INTRAMUSCULAR | Status: DC | PRN
  Administered 2021-03-15: 25 ug via INTRAVENOUS
  Administered 2021-03-15: 50 ug via INTRAVENOUS
  Administered 2021-03-15: 25 ug via INTRAVENOUS
  Administered 2021-03-15 (×2): 50 ug via INTRAVENOUS
  Administered 2021-03-15: 25 ug via INTRAVENOUS
  Administered 2021-03-15: 50 ug via INTRAVENOUS
  Administered 2021-03-15: 25 ug via INTRAVENOUS

## 2021-03-15 MED ORDER — FENTANYL CITRATE (PF) 100 MCG/2ML IJ SOLN
INTRAMUSCULAR | Status: AC
  Filled 2021-03-15: qty 2

## 2021-03-15 MED ORDER — IOHEXOL 300 MG/ML  SOLN: 100.0000 mL | Freq: Once | INTRAMUSCULAR | Status: DC | PRN

## 2021-03-15 MED ORDER — LIDOCAINE HCL 1 % IJ SOLN
INTRAMUSCULAR | Status: DC | PRN
  Administered 2021-03-15: 10 mL via INTRADERMAL

## 2021-03-15 MED ORDER — MIDAZOLAM HCL 2 MG/2ML IJ SOLN
INTRAMUSCULAR | Status: AC
  Filled 2021-03-15: qty 6

## 2021-03-15 MED ORDER — HYDROMORPHONE HCL 1 MG/ML IJ SOLN
INTRAMUSCULAR | Status: AC
  Filled 2021-03-15: qty 1

## 2021-03-15 MED ORDER — LIDOCAINE HCL 1 % IJ SOLN
INTRAMUSCULAR | Status: AC
  Filled 2021-03-15: qty 20

## 2021-03-15 MED ORDER — HYDROMORPHONE HCL 1 MG/ML IJ SOLN
0.5000 mg | INTRAMUSCULAR | Status: DC | PRN
Start: 2021-03-15 — End: 2021-03-19
  Administered 2021-03-15 – 2021-03-19 (×25): 1 mg via INTRAVENOUS
  Filled 2021-03-15 (×25): qty 1

## 2021-03-15 MED ORDER — FENTANYL CITRATE (PF) 100 MCG/2ML IJ SOLN
INTRAMUSCULAR | Status: AC
  Filled 2021-03-15: qty 4

## 2021-03-15 MED ORDER — MIDAZOLAM HCL 2 MG/2ML IJ SOLN
INTRAMUSCULAR | Status: DC | PRN
  Administered 2021-03-15: 1 mg via INTRAVENOUS
  Administered 2021-03-15 (×2): .5 mg via INTRAVENOUS
  Administered 2021-03-15 (×4): 1 mg via INTRAVENOUS
  Administered 2021-03-15 (×2): .5 mg via INTRAVENOUS
  Administered 2021-03-15: 1 mg via INTRAVENOUS

## 2021-03-15 NOTE — Progress Notes (Signed)
2 Days Post-Op   Subjective/Chief Complaint: No new issues BM yesterday  Objective: Vital signs in last 24 hours: Temp:  [97.4 F (36.3 C)-98 F (36.7 C)] 97.4 F (36.3 C) (12/26 0656) Pulse Rate:  [68-84] 68 (12/26 0656) Resp:  [18-20] 18 (12/26 0656) BP: (106-136)/(56-72) 136/72 (12/26 0656) SpO2:  [96 %-100 %] 100 % (12/26 0656) Last BM Date: 03/14/21  Intake/Output from previous day: 12/25 0701 - 12/26 0700 In: 1480.6 [P.O.:690; I.V.:646; IV Piggyback:144.6] Out: -  Intake/Output this shift: No intake/output data recorded.  No clinical jaundice Incisions c/d/I Abdominal - incisional tenderness  Lab Results:  Recent Labs    03/15/21 0455  WBC 6.3  HGB 13.1  HCT 40.1  PLT 149*   BMET Recent Labs    03/14/21 0600 03/15/21 0455  NA 137 140  K 3.7 3.8  CL 109 107  CO2 24 23  GLUCOSE 103* 97  BUN 12 8  CREATININE 0.78 0.81  CALCIUM 8.4* 8.6*   Hepatic Function Latest Ref Rng & Units 03/15/2021 03/14/2021 03/13/2021  Total Protein 6.5 - 8.1 g/dL 6.5 7.2 7.4  Albumin 3.5 - 5.0 g/dL 3.5 3.9 3.9  AST 15 - 41 U/L 376(H) 391(H) 272(H)  ALT 0 - 44 U/L 611(H) 601(H) 518(H)  Alk Phosphatase 38 - 126 U/L 276(H) 268(H) 248(H)  Total Bilirubin 0.3 - 1.2 mg/dL 1.5(H) 2.1(H) 2.7(H)  Bilirubin, Direct 0.0 - 0.2 mg/dL - - -    PT/INR Recent Labs    03/15/21 0455  LABPROT 12.2  INR 0.9   ABG No results for input(s): PHART, HCO3 in the last 72 hours.  Invalid input(s): PCO2, PO2  Studies/Results: No results found.  Anti-infectives: Anti-infectives (From admission, onward)    Start     Dose/Rate Route Frequency Ordered Stop   03/13/21 0809  piperacillin-tazobactam (ZOSYN) 3.375 GM/50ML IVPB       Note to Pharmacy: Christell Faith L: cabinet override      03/13/21 0809 03/13/21 0849   03/12/21 1245  ceFAZolin (ANCEF) IVPB 2g/100 mL premix  Status:  Discontinued        2 g 200 mL/hr over 30 Minutes Intravenous On call to O.R. 03/12/21 1218 03/12/21 1838    03/11/21 0000  piperacillin-tazobactam (ZOSYN) IVPB 3.375 g        3.375 g 12.5 mL/hr over 240 Minutes Intravenous Every 8 hours 03/10/21 2336     03/10/21 1345  piperacillin-tazobactam (ZOSYN) IVPB 3.375 g        3.375 g 100 mL/hr over 30 Minutes Intravenous  Once 03/10/21 1330 03/10/21 1635       Assessment/Plan: s/p Procedure(s): LAPAROSCOPIC CHOLECYSTECTOMY WITH  INTRAOPERATIVE CHOLANGIOGRAM (N/A) Acute cholecystitis with choledocholithiasis ERCP unable to extract stone pre-op Plan for percutaneous transhepatic drain by IR today/ eventual elective ERCP. Liver functions slowly improving No acute surgical issues - OK for discharge when cleared by hospitalists/ GI.  We will sign off for now.  Discharge instructions in chart.  LOS: 5 days    Lynn Simpson 03/15/2021

## 2021-03-15 NOTE — Progress Notes (Signed)
[facility] Progress Note  CC:   Biliary obstruction  Subjective: She denies having nausea or vomiting.  No significant abdominal pain.  She passed a bowel movement yesterday evening and this morning, stool is green.  No rectal bleeding or melena.  She is NPO for perc drain per IR.  Family at the bedside.  Objective:   ERCP 03/12/2021: - J-shaped deformity of the stomach. - The major papilla was adjacent to a diverticulum. - The major papilla appeared congested and had a hood and excess tissue in place. - Only standard ERCP scope position could be used to evaluate the ampullary region (semilong/ long position did not allow for any visualization of the papilla). - One temporary plastic pancreatic stent was placed into the ventral pancreatic duct to try and aid with biliary cannulation and for PEP was placed. - A biliary precut sphincterotomy was performed to try and gain biliary access. - As noted above, unsuccessful at cannulation  Vital signs in last 24 hours: Temp:  [97.4 F (36.3 C)-98 F (36.7 C)] 97.4 F (36.3 C) (12/26 0656) Pulse Rate:  [68-84] 68 (12/26 0656) Resp:  [18-20] 18 (12/26 0656) BP: (106-136)/(56-72) 136/72 (12/26 0656) SpO2:  [96 %-100 %] 100 % (12/26 0656) Last BM Date: 03/14/21 General:   Alert 50 year old female no acute distress Eyes: No scleral icterus. Heart: Regular rate and rhythm, no murmurs Pulm: Breath sounds clear throughout. Abdomen: Soft, nondistended.  Hypoactive bowel sounds to all 4 quadrants.  Mild tenderness above and below the umbilicus without rebound or guarding.  Laparoscopic incisions intact without erythema or exudate. Extremities:  Without edema. Neurologic:  Alert and  oriented x4;  grossly normal neurologically. Psych:  Alert and cooperative. Normal mood and affect.  Intake/Output from previous day: 12/25 0701 - 12/26 0700 In: 1480.6 [P.O.:690; I.V.:646; IV Piggyback:144.6] Out: -  Intake/Output this  shift: No intake/output data recorded.  Lab Results: Recent Labs    03/15/21 0455  WBC 6.3  HGB 13.1  HCT 40.1  PLT 149*   BMET Recent Labs    03/13/21 0518 03/14/21 0600 03/15/21 0455  NA 138 137 140  K 4.0 3.7 3.8  CL 104 109 107  CO2 25 24 23   GLUCOSE 109* 103* 97  BUN 8 12 8   CREATININE 0.58 0.78 0.81  CALCIUM 9.4 8.4* 8.6*   LFT Recent Labs    03/15/21 0455  PROT 6.5  ALBUMIN 3.5  AST 376*  ALT 611*  ALKPHOS 276*  BILITOT 1.5*   PT/INR Recent Labs    03/15/21 0455  LABPROT 12.2  INR 0.9   Hepatitis Panel No results for input(s): HEPBSAG, HCVAB, HEPAIGM, HEPBIGM in the last 72 hours.  No results found.  Assessment / Plan:  74) 50 year old female with a 2 month history of recurrent N/V and LUQ pain admitted to the hospital 03/10/2021 with worsening N/V, LUQ and RUQ pain with the development of dark colored urine and acholic stools x 2 days. Labs in the ED consistent with acute cholestasis concerning for biliary obstruction. RUQ sonogram identified gallstones with a dilated CBD choledocholithiasis/biliary obstruction. She was noted to have a positive sonographic Murphy sign concerning for acute cholecystitis.  Abdominal MRI/MRCP this am showed cholelithiasis without acute cholecystitis and choledocholithiasis with at least 3 small calculi in the distal common bile duct with mild superimposed extrahepatic biliary ductal dilatation. S/P unsuccessful ERCP 03/12/2021, a temporary plastic stent was placed into the ventral pancreatic duct to reduce the risk  of post ERCP pancreatitis. S/P lap chole 03/13/2021 with + IOC. IR consulted for internal/external percutaneous biliary drain.  She is afebrile.  WBC 6.3.  LFTs stable. -If percutaneous biliary drain placement is successful today, Dr. Rush Landmark will plan on a rendezvous ERCP via the biliary drain access in 3 to 6 weeks as an outpatient.  If the [facility] biliary drain is unsuccessful, she will require transfer to a  tertiary center for repeat ERCP  -NPO for now -Continue Zosyn IV -Continue Pantoprazole 20 mg p.o. daily -Check CBC, hepatic panel in a.m. -Await further recommendations per Dr. Rush Landmark   2) History of Lynch Syndrome -Surveillance colonoscopy scheduled as an outpatient on 04/12/2020     Principal Problem:   Choledocholithiasis with acute cholecystitis Active Problems:   Elevated LFTs     LOS: 5 days   Lynn Simpson  03/15/2021, 11:38 AM

## 2021-03-15 NOTE — Progress Notes (Signed)
PROGRESS NOTE    Lynn Simpson  XAJ:[id number] DOB: June 05, 1970 DOA: 03/10/2021 PCP: Haywood Pao, MD  Chief Complaint  Patient presents with   Emesis    Brief Narrative:  Lynn Simpson is Lynn Simpson 50 y.o. female with Lynn Brannum histoy of Lynch syndrome. She presented secondary to RUQ pain and acholic stools with evidence of cholecystitis and choledocholithiasis on imaging. Plan for ERCP followed by cholecystectomy.    Assessment & Plan:   Principal Problem:   Choledocholithiasis with acute cholecystitis Active Problems:   Elevated LFTs   * Choledocholithiasis with acute cholecystitis- (present on admission) MRCP 12/22 with cholelithiasis without cholecystitis and choledocholithiasis with 3 small calculi up to 3 mm in diameter within distal common duct - mild superimposed extrahepatic biliary ductal dilatation S/p incomplete ERCP on 12/23 S/p lap cholecystectomy with IOC on 12/24  Intraoperative cholangiogram with distal CBD obstructing stone and biliary dilatation Appreciate GI recs, planning for IR consult, perc biliary drain pending (IR hopeful to get to her today) NPO after midnight 12/25 Abx  Elevated LFTs 2/2 choledocholithiasis Negative acute hepatitis panel   DVT prophylaxis: SCD Code Status: full Family Communication: family at bedside, husband and 2 sons Disposition:   Status is: Inpatient  Remains inpatient appropriate because: need for IR c/s       Consultants:  GI surgery  Procedures:  ERCP Lap chole with IOC  Antimicrobials:  Anti-infectives (From admission, onward)    Start     Dose/Rate Route Frequency Ordered Stop   03/13/21 0809  piperacillin-tazobactam (ZOSYN) 3.375 GM/50ML IVPB       Note to Pharmacy: Christell Faith L: cabinet override      03/13/21 0809 03/13/21 0849   03/12/21 1245  ceFAZolin (ANCEF) IVPB 2g/100 mL premix  Status:  Discontinued        2 g 200 mL/hr over 30 Minutes Intravenous On call to O.R. 03/12/21 1218 03/12/21 1838    03/11/21 0000  piperacillin-tazobactam (ZOSYN) IVPB 3.375 g        3.375 g 12.5 mL/hr over 240 Minutes Intravenous Every 8 hours 03/10/21 2336     03/10/21 1345  piperacillin-tazobactam (ZOSYN) IVPB 3.375 g        3.375 g 100 mL/hr over 30 Minutes Intravenous  Once 03/10/21 1330 03/10/21 1635       Subjective: No new complaints  Objective: Vitals:   03/15/21 1229 03/15/21 1600 03/15/21 1605 03/15/21 1610  BP: (!) 146/83 (!) 153/90 (!) 149/86 140/88  Pulse: 78 70 68 76  Resp: 20 20 (!) 22 15  Temp: (!) 97.5 F (36.4 C)     TempSrc: Oral     SpO2: 96% 100% 100% 99%  Weight:      Height:        Intake/Output Summary (Last 24 hours) at 03/15/2021 1615 Last data filed at 03/15/2021 0900 Gross per 24 hour  Intake 1030.59 ml  Output --  Net 1030.59 ml   Filed Weights   03/10/21 0949  Weight: 115.7 kg    Examination:  General: No acute distress. Cardiovascular: RRR Lungs: CTAB Neurological: Alert and oriented 3. Moves all extremities 4 . Cranial nerves II through XII grossly intact. Skin: Warm and dry. No rashes or lesions. Extremities: No clubbing or cyanosis. No edema.   Data Reviewed: I have personally reviewed following labs and imaging studies  CBC: Recent Labs  Lab 03/10/21 1021 03/11/21 0554 03/12/21 0540 03/15/21 0455  WBC 6.2 6.3 5.9 6.3  NEUTROABS 3.6 3.2  --   --  HGB 15.3* 14.4 13.5 13.1  HCT 45.3 42.8 39.8 40.1  MCV 89.7 89.9 90.9 92.8  PLT 180 166 152 149*    Basic Metabolic Panel: Recent Labs  Lab 03/11/21 0554 03/12/21 0540 03/13/21 0518 03/14/21 0600 03/15/21 0455  NA 139 139 138 137 140  K 3.3* 3.6 4.0 3.7 3.8  CL 105 106 104 109 107  CO2 24 26 25 24 23   GLUCOSE 75 92 109* 103* 97  BUN 11 8 8 12 8   CREATININE 0.67 0.74 0.58 0.78 0.81  CALCIUM 8.7* 8.7* 9.4 8.4* 8.6*    GFR: Estimated Creatinine Clearance: 102.3 mL/min (by C-G formula based on SCr of 0.81 mg/dL).  Liver Function Tests: Recent Labs  Lab 03/11/21 0554  03/12/21 0540 03/13/21 0518 03/14/21 0600 03/15/21 0455  AST 353* 244* 272* 391* 376*  ALT 650* 533* 518* 601* 611*  ALKPHOS 188* 196* 248* 268* 276*  BILITOT 3.4* 2.5* 2.7* 2.1* 1.5*  PROT 7.2 6.5 7.4 7.2 6.5  ALBUMIN 4.0 3.6 3.9 3.9 3.5    CBG: No results for input(s): GLUCAP in the last 168 hours.   Recent Results (from the past 240 hour(s))  Resp Panel by RT-PCR (Flu Zionah Criswell&B, Covid) Nasopharyngeal Swab     Status: None   Collection Time: 03/10/21 10:06 AM   Specimen: Nasopharyngeal Swab; Nasopharyngeal(NP) swabs in vial transport medium  Result Value Ref Range Status   SARS Coronavirus 2 by RT PCR NEGATIVE NEGATIVE Final    Comment: (NOTE) SARS-CoV-2 target nucleic acids are NOT DETECTED.  The SARS-CoV-2 RNA is generally detectable in upper respiratory specimens during the acute phase of infection. The lowest concentration of SARS-CoV-2 viral copies this assay can detect is 138 copies/mL. Annalisse Minkoff negative result does not preclude SARS-Cov-2 infection and should not be used as the sole basis for treatment or other patient management decisions. Coren Crownover negative result may occur with  improper specimen collection/handling, submission of specimen other than nasopharyngeal swab, presence of viral mutation(s) within the areas targeted by this assay, and inadequate number of viral copies(<138 copies/mL). Morning Halberg negative result must be combined with clinical observations, patient history, and epidemiological information. The expected result is Negative.  Fact Sheet for Patients:  EntrepreneurPulse.com.au  Fact Sheet for Healthcare Providers:  IncredibleEmployment.be  This test is no t yet approved or cleared by the [place] FDA and  has been authorized for detection and/or diagnosis of SARS-CoV-2 by FDA under an Emergency Use Authorization (EUA). This EUA will remain  in effect (meaning this test can be used) for the duration of the COVID-19 declaration  under Section 564(b)(1) of the Act, 21 U.S.C.section 360bbb-3(b)(1), unless the authorization is terminated  or revoked sooner.       Influenza Caleen Taaffe by PCR NEGATIVE NEGATIVE Final   Influenza B by PCR NEGATIVE NEGATIVE Final    Comment: (NOTE) The Xpert Xpress SARS-CoV-2/FLU/RSV plus assay is intended as an aid in the diagnosis of influenza from Nasopharyngeal swab specimens and should not be used as Raisha Brabender sole basis for treatment. Nasal washings and aspirates are unacceptable for Xpert Xpress SARS-CoV-2/FLU/RSV testing.  Fact Sheet for Patients: EntrepreneurPulse.com.au  Fact Sheet for Healthcare Providers: IncredibleEmployment.be  This test is not yet approved or cleared by the [place] FDA and has been authorized for detection and/or diagnosis of SARS-CoV-2 by FDA under an Emergency Use Authorization (EUA). This EUA will remain in effect (meaning this test can be used) for the duration of the COVID-19 declaration under Section 564(b)(1) of the Act,  21 U.S.C. section 360bbb-3(b)(1), unless the authorization is terminated or revoked.  Performed at [facility], [address] [place], [place] [address]   Culture, blood (routine x 2)     Status: None   Collection Time: 03/10/21  1:49 PM   Specimen: BLOOD  Result Value Ref Range Status   Specimen Description BLOOD LEFT ANTECUBITAL  Final   Special Requests   Final    BOTTLES DRAWN AEROBIC ONLY Blood Culture adequate volume   Culture   Final    NO GROWTH 5 DAYS Performed at [facility], [place] [address], [place], [place] [address]    Report Status 03/15/2021 FINAL  Final  Culture, blood (routine x 2)     Status: None   Collection Time: 03/10/21  2:00 PM   Specimen: BLOOD  Result Value Ref Range Status   Specimen Description   Final    BLOOD Blood Culture adequate volume Performed at [facility], [address] [place], [place] [address]    Special  Requests   Final    BOTTLES DRAWN AEROBIC AND ANAEROBIC BLOOD LEFT HAND Performed at [facility], [address], [place], [place] [address]    Culture   Final    NO GROWTH 5 DAYS Performed at [facility], [place] [address], [place], [place] [address]    Report Status 03/15/2021 FINAL  Final         Radiology Studies: No results found.      Scheduled Meds:  indomethacin  100 mg Rectal Once   lidocaine       pantoprazole  20 mg Oral Daily   polyethylene glycol  17 g Oral BID   senna  2 tablet Oral QHS   Continuous Infusions:  sodium chloride Stopped (03/14/21 1624)   lactated ringers 10 mL/hr at 03/13/21 0827   piperacillin-tazobactam (ZOSYN)  IV 3.375 g (03/15/21 0835)     LOS: 5 days    Time spent: over 21 mni    Fayrene Helper, MD [facility]   To contact the attending provider between 7A-7P or the covering provider during after hours 7P-7A, please log into the web site [email or web address] and access using universal [place] password for that web site. If you do not have the password, please call the hospital operator.  03/15/2021, 4:15 PM

## 2021-03-15 NOTE — Procedures (Signed)
Pre procedural Dx: Choledocholithiasis. Post procedural Dx: Same  Attempted though ultimately unsuccessful placement of a transhepatic biliary drain due to a combination of lack of any significant intrahepatic biliary ductal dilatation and patient's body habitus and poor sonographic window.   Intra-operative cholangiogram demonstrates resorted patency of the CBD with passage of contrast to the level of the duodenum.   EBL: Trace Complications: None immediate  Above d/w Dr. Rush Landmark at the time of procedure completion.   Ronny Bacon, MD Pager #: [phone]

## 2021-03-16 DIAGNOSIS — R7989 Other specified abnormal findings of blood chemistry: Secondary | ICD-10-CM | POA: Diagnosis not present

## 2021-03-16 DIAGNOSIS — K8042 Calculus of bile duct with acute cholecystitis without obstruction: Secondary | ICD-10-CM | POA: Diagnosis not present

## 2021-03-16 LAB — CBC WITH DIFFERENTIAL/PLATELET
Abs Immature Granulocytes: 0.03 10*3/uL (ref 0.00–0.07)
Basophils Absolute: 0 10*3/uL (ref 0.0–0.1)
Basophils Relative: 0 %
Eosinophils Absolute: 0.1 10*3/uL (ref 0.0–0.5)
Eosinophils Relative: 1 %
HCT: 40.1 % (ref 36.0–46.0)
Hemoglobin: 13.1 g/dL (ref 12.0–15.0)
Immature Granulocytes: 0 %
Lymphocytes Relative: 19 %
Lymphs Abs: 1.7 10*3/uL (ref 0.7–4.0)
MCH: 30 pg (ref 26.0–34.0)
MCHC: 32.7 g/dL (ref 30.0–36.0)
MCV: 91.8 fL (ref 80.0–100.0)
Monocytes Absolute: 0.5 10*3/uL (ref 0.1–1.0)
Monocytes Relative: 5 %
Neutro Abs: 6.7 10*3/uL (ref 1.7–7.7)
Neutrophils Relative %: 75 %
Platelets: 148 10*3/uL — ABNORMAL LOW (ref 150–400)
RBC: 4.37 MIL/uL (ref 3.87–5.11)
RDW: 12.6 % (ref 11.5–15.5)
WBC: 9 10*3/uL (ref 4.0–10.5)
nRBC: 0 % (ref 0.0–0.2)

## 2021-03-16 LAB — COMPREHENSIVE METABOLIC PANEL
ALT: 532 U/L — ABNORMAL HIGH (ref 0–44)
AST: 208 U/L — ABNORMAL HIGH (ref 15–41)
Albumin: 3.7 g/dL (ref 3.5–5.0)
Alkaline Phosphatase: 270 U/L — ABNORMAL HIGH (ref 38–126)
Anion gap: 7 (ref 5–15)
BUN: 8 mg/dL (ref 6–20)
CO2: 26 mmol/L (ref 22–32)
Calcium: 8.8 mg/dL — ABNORMAL LOW (ref 8.9–10.3)
Chloride: 102 mmol/L (ref 98–111)
Creatinine, Ser: 0.59 mg/dL (ref 0.44–1.00)
GFR, Estimated: >60 mL/min (ref >60)
Glucose, Bld: 110 mg/dL — ABNORMAL HIGH (ref 70–99)
Potassium: 3.9 mmol/L (ref 3.5–5.1)
Sodium: 135 mmol/L (ref 135–145)
Total Bilirubin: 1.4 mg/dL — ABNORMAL HIGH (ref 0.3–1.2)
Total Protein: 7 g/dL (ref 6.5–8.1)

## 2021-03-16 LAB — MAGNESIUM: Magnesium: 1.9 mg/dL (ref 1.7–2.4)

## 2021-03-16 LAB — PHOSPHORUS: Phosphorus: 4 mg/dL (ref 2.5–4.6)

## 2021-03-16 LAB — SURGICAL PATHOLOGY

## 2021-03-16 MED ORDER — OXYCODONE HCL 5 MG PO TABS
10.0000 mg | ORAL_TABLET | ORAL | Status: DC | PRN
  Administered 2021-03-16 – 2021-03-18 (×6): 10 mg via ORAL
  Filled 2021-03-16 (×6): qty 2

## 2021-03-16 MED ORDER — MUSCLE RUB 10-15 % EX CREA
TOPICAL_CREAM | CUTANEOUS | Status: DC | PRN
  Filled 2021-03-16: qty 85

## 2021-03-16 MED ORDER — BISACODYL 10 MG RE SUPP
10.0000 mg | Freq: Once | RECTAL | Status: AC
  Administered 2021-03-16: 19:00:00 10 mg via RECTAL
  Filled 2021-03-16: qty 1

## 2021-03-16 MED ORDER — OXYCODONE HCL 5 MG PO TABS
5.0000 mg | ORAL_TABLET | ORAL | Status: DC | PRN
  Administered 2021-03-16 – 2021-03-18 (×5): 5 mg via ORAL
  Filled 2021-03-16 (×5): qty 1

## 2021-03-16 MED ORDER — DICLOFENAC SODIUM 1 % EX GEL
2.0000 g | Freq: Four times a day (QID) | CUTANEOUS | Status: DC
  Administered 2021-03-17 – 2021-03-18 (×7): 2 g via TOPICAL
  Filled 2021-03-16 (×2): qty 100

## 2021-03-16 MED ORDER — BISACODYL 10 MG RE SUPP
10.0000 mg | Freq: Every day | RECTAL | Status: DC | PRN
  Administered 2021-03-16 – 2021-03-18 (×2): 10 mg via RECTAL
  Filled 2021-03-16 (×2): qty 1

## 2021-03-16 NOTE — Progress Notes (Addendum)
Progress Note Hospital Day: 7  Chief Complaint:   bile duct stone / abdominal pain      Attending physician's note   I have taken an interval history, reviewed the chart and examined the patient. I agree with the Advanced Practitioner's note, impression and recommendations.    Bilirubin and transaminases are downtrending, alk phos remains elevated. Cholelithiasis and choledocholithiasis s/p cholecystectomy, unsuccessful ERCP and PTC drain by IR.  She has a plastic stent in the bile duct.  Complains of right upper quadrant pain, is postop day 3  Continue to monitor LFT, if continues to downtrend with improvement of pain, she may be able to be discharged from the hospital Plan for MRCP in 1 to 2 weeks as outpatient, if has retained stone will need reattempt at ERCP   I have spent 35 minutes of patient care (this includes precharting, chart review, review of results, face-to-face time used for counseling as well as treatment plan and follow-up. The patient was provided an opportunity to ask questions and all were answered. The patient agreed with the plan and demonstrated an understanding of the instructions.  Damaris Hippo , MD [phone]     ASSESSMENT AND PLAN   Brief History: 50 yo female with past medical history of lynch syndrome / multiple tubular adenomas and sessile serrated colon polyps, bilateral mastectomies, IBS, diverticulosis, GERD, asthma. Admitted 12/21 with biliary obstruction /  cholelithiasis / acute cholecystitis / choledocholithiasis  # Cholelithiasis / choledocholithiasis. ERCP 12/23 was incomplete --> biliary cannulation was not possible. Despite multiple attempts. Temporary plastic stent placed. Underwent lap chole with IOC on 12/24, stone unable to be pushed out during surgery. Interventional Radiology attempted PBD yesterday but unsuccessful due to lack of significant intrahepatic biliary ductal dilation, body habitus and poor sonographic window.   --Slight improvement in liver chemistries overnight. RUQ pain is better, just post-op discomfort now. She is having lower abdominal discomfort but she thinks that may be related to constipation.  --seeing her for first time today but my understanding is that the plan is for follow up outpatient MRCP in 1-2 weeks, our office will arrange. We will also arrange for eventual removal of plastic stent.  --She can have a diet from GI standpoint --She is getting a dose of miralax right night. Hopefully bowels will move and she will get resolution of lower abdominal discomfort.      SUBJECTIVE   No significant RUQ pain, just post-op discomfort. Having mid lower abdominal discomfort but thinks it may be related to constipation. She is inquiring about ongoing NPO status       OBJECTIVE      Scheduled inpatient medications:   indomethacin  100 mg Rectal Once   pantoprazole  20 mg Oral Daily   polyethylene glycol  17 g Oral BID   senna  2 tablet Oral QHS   Continuous inpatient infusions:   sodium chloride Stopped (03/14/21 1624)   lactated ringers 10 mL/hr at 03/13/21 0827   piperacillin-tazobactam (ZOSYN)  IV 3.375 g (03/16/21 0922)   PRN inpatient medications: acetaminophen **OR** acetaminophen, clonazepam, docusate sodium, fentaNYL, HYDROmorphone (DILAUDID) injection, iohexol, lidocaine, lip balm, midazolam, ondansetron **OR** ondansetron (ZOFRAN) IV, oxyCODONE-acetaminophen, traZODone  Vital signs in last 24 hours: Temp:  [97.5 F (36.4 C)-98 F (36.7 C)] 98 F (36.7 C) (12/27 0407) Pulse Rate:  [64-85] 69 (12/27 0407) Resp:  [13-22] 18 (12/27 0407) BP: (121-170)/(75-110) 165/86 (12/27 0407) SpO2:  [94 %-100 %] 94 % (12/27 0407) Last  BM Date: 03/15/21  Intake/Output Summary (Last 24 hours) at 03/16/2021 0930 Last data filed at 03/15/2021 1812 Gross per 24 hour  Intake 0 ml  Output --  Net 0 ml     Physical Exam:  General: Alert female in NAD Heart:  Regular rate and  rhythm. No lower extremity edema Pulmonary: Normal respiratory effort Abdomen: Soft, nondistended, nontender. Normal bowel sounds.  Neurologic: Alert and oriented Psych: Pleasant. Cooperative.   Filed Weights   03/10/21 0949  Weight: 115.7 kg    Intake/Output from previous day: No intake/output data recorded. Intake/Output this shift: No intake/output data recorded.    Lab Results: Recent Labs    03/15/21 0455 03/16/21 0448  WBC 6.3 9.0  HGB 13.1 13.1  HCT 40.1 40.1  PLT 149* 148*   BMET Recent Labs    03/14/21 0600 03/15/21 0455 03/16/21 0448  NA 137 140 135  K 3.7 3.8 3.9  CL 109 107 102  CO2 _0 GLUCOSE 103* 97 110*  BUN _1 CREATININE 0.78 0.81 0.59  CALCIUM 8.4* 8.6* 8.8*     Latest Reference Range & Units 03/13/21 05:18 03/14/21 06:00 03/15/21 04:55 03/16/21 04:48  Phosphorus 2.5 - 4.6 mg/dL    4.0  Magnesium 1.7 - 2.4 mg/dL    1.9  Alkaline Phosphatase 38 - 126 U/L 248 (H) 268 (H) 276 (H) 270 (H)  Albumin 3.5 - 5.0 g/dL 3.9 3.9 3.5 3.7  AST 15 - 41 U/L 272 (H) 391 (H) 376 (H) 208 (H)  ALT 0 - 44 U/L 518 (H) 601 (H) 611 (H) 532 (H)  Total Protein 6.5 - 8.1 g/dL 7.4 7.2 6.5 7.0  Total Bilirubin 0.3 - 1.2 mg/dL 2.7 (H) 2.1 (H) 1.5 (H) 1.4 (H)  GFR, Estimated >60 mL/min >60 >60 >60 >60      PT/INR Recent Labs    03/15/21 0455  LABPROT 12.2  INR 0.9   Hepatitis Panel No results for input(s): HEPBSAG, HCVAB, HEPAIGM, HEPBIGM in the last 72 hours.  IR PERCUTANEOUS TRANSHEPATIC CHOLANGIOGRAM  Result Date: 03/15/2021 INDICATION: History of choledocholithiasis, post attempted though ultimately unsuccessful ERCP. Patient subsequent underwent laparoscopic cholecystectomy with intraoperative cholangiogram demonstrating an occlusive stone within the distal aspect of the CBD. As such patient presents for image guided placement an internal/external biliary drainage catheter Note, the patient's LFTs have normalized and she has no significant  intrahepatic biliary ductal dilatation on MRCP 03/11/2021 however Dr. Rush Landmark would like [place] to proceed with attempted internal/external biliary drainage catheter placement. EXAM: ULTRASOUND AND FLUOROSCOPIC GUIDED TRANSHEPATIC CHOLANGIOGRAM COMPARISON:  MRCP-03/11/2021 MEDICATIONS: Patient is currently admitted to the hospital receiving intravenous antibiotics; The antibiotic was administered with an appropriate time frame prior to the initiation of the procedure CONTRAST:  15 cc Isovue-300 - administered into the biliary tree. ANESTHESIA/SEDATION: Moderate (conscious) sedation was employed during this procedure. A total of Versed 8 mg, fentanyl 300 mcg and Dilaudid 1 mg was administered intravenously. Moderate Sedation Time: 70 minutes. The patient's level of consciousness and vital signs were monitored continuously by radiology nursing throughout the procedure under my direct supervision. FLUOROSCOPY TIME:  18.4 minutes (1941 mGy) COMPLICATIONS: None immediate. TECHNIQUE: Informed written consent was obtained from the patient after a discussion of the risks, benefits and alternatives to treatment. Questions regarding the procedure were encouraged and answered. A timeout was performed prior to the initiation of the procedure. Sonographic evaluation performed redemonstrated no significant intrahepatic biliary ductal dilatation. Additionally, there is poor sonographic window of  both the left and right lobe of the liver secondary to patient body habitus. The right upper abdominal quadrant and midline of the upper abdomen was prepped and draped in the usual sterile fashion, and a sterile drape was applied covering the operative field. Maximum barrier sterile technique with sterile gowns and gloves were used for the procedure. A timeout was performed prior to the initiation of the procedure. Under direct ultrasound guidance, efforts were made to cannulate a nondilated duct within left lobe liver however this  ultimately proved unsuccessful. Next, under ultrasound guidance, after which were made to cannulate a nondilated duct within the peripheral aspect the right lobe liver Contrast injection was able to opacify a peripheral nondilated portion of the right intrahepatic biliary tree. Multiple spot fluoroscopic and radiographic images were obtained in various obliquities demonstrating patency of the CBD with passage of contrast to the level duodenum and without a discrete residual filling defect to suggest residual choledocholithiasis. Prolonged efforts were made to cannulate the opacified right bile duct however this ultimately proved unsuccessful. The opacified bile duct was then targeted fluoroscopically however again, the duct could not be cannulated due to lack of dilatation as well as patient body habitus. At this time, the decision was made to cc further attempts at placement of an internal/external biliary drainage catheter. The procedure was terminated. Dressings were applied. The patient tolerated the procedure well without immediate postprocedural complication though was expectedly uncomfortable at the time of procedure completion. FINDINGS: Sonographic evaluation demonstrates no significant intrahepatic biliary ductal dilatation with poor sonographic window of both the right and left lobes liver secondary to patient body habitus and poor sonographic window. Ultimately, under a combination of ultrasound and fluoroscopic guidance, the peripheral aspect of a nondilated right intrahepatic biliary duct was opacified with intraoperative cholangiogram demonstrating restored patency of the CBD with passage of contrast to the level duodenum and without a definitive residual filling defect to suggest residual choledocholithiasis. Despite prolonged efforts, the opacified biliary tree could not be successfully cannulated secondary to lack of intrahepatic biliary ductal dilatation. Redemonstrated pancreatic stent.  IMPRESSION: 1. Attempted though ultimately unsuccessful placement of an internal/external biliary drainage catheter secondary to lack of intrahepatic biliary duct dilatation and patient body habitus. 2. Intra procedural cholangiogram demonstrates restored patency of the CBD with passage of contrast to the level duodenum and without a discrete residual filling defect to suggest residual choledocholithiasis. Electronically Signed   By: Sandi Mariscal M.D.   On: 03/15/2021 17:48        Principal Problem:   Choledocholithiasis with acute cholecystitis Active Problems:   Elevated LFTs     LOS: 6 days   Tye Savoy ,NP 03/16/2021, 9:30 AM

## 2021-03-16 NOTE — Progress Notes (Signed)
PROGRESS NOTE    Lynn Simpson  MEQ:[id number] DOB: [birth year] DOA: 03/10/2021 PCP: Haywood Pao, MD  Chief Complaint  Patient presents with   Emesis    Brief Narrative:  Lynn Simpson is Lynn Simpson 50 y.o. female with Lynn Simpson histoy of Lynch syndrome. She presented secondary to RUQ pain and acholic stools with evidence of cholecystitis and choledocholithiasis on imaging. She had incomplete ERCP followed by lap chole with IOC showing distal CBD obstructing stone.  She's now s/p failed ext/int biliary drain by IR.  At this time, she has some worsened post procedural pain.  LFT's slightly improved.  Discharge pending improvement in pain and further improvement in LFT's and clearance by GI.  See below for additional details.    Assessment & Plan:   Principal Problem:   Choledocholithiasis with acute cholecystitis Active Problems:   Elevated LFTs   * Choledocholithiasis with acute cholecystitis- (present on admission) MRCP 12/22 with cholelithiasis without cholecystitis and choledocholithiasis with 3 small calculi up to 3 mm in diameter within distal common duct - mild superimposed extrahepatic biliary ductal dilatation S/p incomplete ERCP on 12/23 - plastic pancreatic stent placed into ventral pancreatic duct - unsuccessful at cannulation S/p lap cholecystectomy with IOC on 12/24  Intraoperative cholangiogram with distal CBD obstructing stone and biliary dilatation IR unsuccessful placement of internal/external biliary drainage catheter due to lack of intrahepatic biliary duct dilatation and patient body habitus -> intraprocedural cholangiogram demonstrates resorted patency of CBD (12/26) Abx per GI GI rec outpatient MRCP in 1-2 weeks  LFT's downtrending  She has worsening pain after IR procedure 12/26, discussed with IR who noted could be normal post procedural pain  Would have low threshold for repeat imaging given worsening pain  Elevated LFTs 2/2 choledocholithiasis Negative acute  hepatitis panel Improving, follow   DVT prophylaxis: SCD Code Status: full Family Communication: family at bedside, husband and 2 sons Disposition:   Status is: Inpatient  Remains inpatient appropriate because: need for IR c/s       Consultants:  GI surgery  Procedures:  ERCP Lap chole with IOC  Antimicrobials:  Anti-infectives (From admission, onward)    Start     Dose/Rate Route Frequency Ordered Stop   03/13/21 0809  piperacillin-tazobactam (ZOSYN) 3.375 GM/50ML IVPB       Note to Pharmacy: Lynn Simpson: cabinet override      03/13/21 0809 03/13/21 0849   03/12/21 1245  ceFAZolin (ANCEF) IVPB 2g/100 mL premix  Status:  Discontinued        2 g 200 mL/hr over 30 Minutes Intravenous On call to O.R. 03/12/21 1218 03/12/21 1838   03/11/21 0000  piperacillin-tazobactam (ZOSYN) IVPB 3.375 g        3.375 g 12.5 mL/hr over 240 Minutes Intravenous Every 8 hours 03/10/21 2336     03/10/21 1345  piperacillin-tazobactam (ZOSYN) IVPB 3.375 g        3.375 g 100 mL/hr over 30 Minutes Intravenous  Once 03/10/21 1330 03/10/21 1635       Subjective: C/o abdominal pain  Objective: Vitals:   03/15/21 1720 03/15/21 1809 03/16/21 0407 03/16/21 1327  BP: (!) 170/110 (!) 162/85 (!) 165/86 (!) 181/93  Pulse: 81 79 69 73  Resp: 17 20 18 16   Temp:  97.6 F (36.4 C) 98 F (36.7 C) 97.7 F (36.5 C)  TempSrc:  Oral Oral Oral  SpO2: 99% 95% 94% 94%  Weight:      Height:  Intake/Output Summary (Last 24 hours) at 03/16/2021 1852 Last data filed at 03/16/2021 1300 Gross per 24 hour  Intake 240 ml  Output --  Net 240 ml   Filed Weights   03/10/21 0949  Weight: 115.7 kg    Examination:  General: No acute distress. Cardiovascular: RRR Lungs: unlabored Abdomen: TTP to R side Neurological: Alert and oriented 3. Moves all extremities 4. Cranial nerves II through XII grossly intact. Skin: Warm and dry. No rashes or lesions. Extremities: No clubbing or cyanosis.  No edema  Data Reviewed: I have personally reviewed following labs and imaging studies  CBC: Recent Labs  Lab 03/10/21 1021 03/11/21 0554 03/12/21 0540 03/15/21 0455 03/16/21 0448  WBC 6.2 6.3 5.9 6.3 9.0  NEUTROABS 3.6 3.2  --   --  6.7  HGB 15.3* 14.4 13.5 13.1 13.1  HCT 45.3 42.8 39.8 40.1 40.1  MCV 89.7 89.9 90.9 92.8 91.8  PLT 180 166 152 149* 148*    Basic Metabolic Panel: Recent Labs  Lab 03/12/21 0540 03/13/21 0518 03/14/21 0600 03/15/21 0455 03/16/21 0448  NA 139 138 137 140 135  K 3.6 4.0 3.7 3.8 3.9  CL 106 104 109 107 102  CO2 26 25 24 23 26   GLUCOSE 92 109* 103* 97 110*  BUN 8 8 12 8 8   CREATININE 0.74 0.58 0.78 0.81 0.59  CALCIUM 8.7* 9.4 8.4* 8.6* 8.8*  MG  --   --   --   --  1.9  PHOS  --   --   --   --  4.0    GFR: Estimated Creatinine Clearance: 103.6 mL/min (by C-G formula based on SCr of 0.59 mg/dL).  Liver Function Tests: Recent Labs  Lab 03/12/21 0540 03/13/21 0518 03/14/21 0600 03/15/21 0455 03/16/21 0448  AST 244* 272* 391* 376* 208*  ALT 533* 518* 601* 611* 532*  ALKPHOS 196* 248* 268* 276* 270*  BILITOT 2.5* 2.7* 2.1* 1.5* 1.4*  PROT 6.5 7.4 7.2 6.5 7.0  ALBUMIN 3.6 3.9 3.9 3.5 3.7    CBG: No results for input(s): GLUCAP in the last 168 hours.   Recent Results (from the past 240 hour(s))  Resp Panel by RT-PCR (Flu Lynn Simpson&B, Covid) Nasopharyngeal Swab     Status: None   Collection Time: 03/10/21 10:06 AM   Specimen: Nasopharyngeal Swab; Nasopharyngeal(NP) swabs in vial transport medium  Result Value Ref Range Status   SARS Coronavirus 2 by RT PCR NEGATIVE NEGATIVE Final    Comment: (NOTE) SARS-CoV-2 target nucleic acids are NOT DETECTED.  The SARS-CoV-2 RNA is generally detectable in upper respiratory specimens during the acute phase of infection. The lowest concentration of SARS-CoV-2 viral copies this assay can detect is 138 copies/mL. Lynn Simpson negative result does not preclude SARS-Cov-2 infection and should not be used as  the sole basis for treatment or other patient management decisions. Lynn Simpson negative result may occur with  improper specimen collection/handling, submission of specimen other than nasopharyngeal swab, presence of viral mutation(s) within the areas targeted by this assay, and inadequate number of viral copies(<138 copies/mL). Kyran Whittier negative result must be combined with clinical observations, patient history, and epidemiological information. The expected result is Negative.  Fact Sheet for Patients:  EntrepreneurPulse.com.au  Fact Sheet for Healthcare Providers:  IncredibleEmployment.be  This test is no t yet approved or cleared by the [place] FDA and  has been authorized for detection and/or diagnosis of SARS-CoV-2 by FDA under an Emergency Use Authorization (EUA). This EUA will remain  in  effect (meaning this test can be used) for the duration of the COVID-19 declaration under Section 564(b)(1) of the Act, 21 U.S.C.section 360bbb-3(b)(1), unless the authorization is terminated  or revoked sooner.       Influenza Atoya Andrew by PCR NEGATIVE NEGATIVE Final   Influenza B by PCR NEGATIVE NEGATIVE Final    Comment: (NOTE) The Xpert Xpress SARS-CoV-2/FLU/RSV plus assay is intended as an aid in the diagnosis of influenza from Nasopharyngeal swab specimens and should not be used as Lynn Simpson sole basis for treatment. Nasal washings and aspirates are unacceptable for Xpert Xpress SARS-CoV-2/FLU/RSV testing.  Fact Sheet for Patients: EntrepreneurPulse.com.au  Fact Sheet for Healthcare Providers: IncredibleEmployment.be  This test is not yet approved or cleared by the [place] FDA and has been authorized for detection and/or diagnosis of SARS-CoV-2 by FDA under an Emergency Use Authorization (EUA). This EUA will remain in effect (meaning this test can be used) for the duration of the COVID-19 declaration under Section 564(b)(1) of  the Act, 21 U.S.C. section 360bbb-3(b)(1), unless the authorization is terminated or revoked.  Performed at [facility], [address] [place], [place] [address]   Culture, blood (routine x 2)     Status: None   Collection Time: 03/10/21  1:49 PM   Specimen: BLOOD  Result Value Ref Range Status   Specimen Description BLOOD LEFT ANTECUBITAL  Final   Special Requests   Final    BOTTLES DRAWN AEROBIC ONLY Blood Culture adequate volume   Culture   Final    NO GROWTH 5 DAYS Performed at [facility], [place] [address], [place], [place] [address]    Report Status 03/15/2021 FINAL  Final  Culture, blood (routine x 2)     Status: None   Collection Time: 03/10/21  2:00 PM   Specimen: BLOOD  Result Value Ref Range Status   Specimen Description   Final    BLOOD Blood Culture adequate volume Performed at [facility], [address] [place], [place] [address]    Special Requests   Final    BOTTLES DRAWN AEROBIC AND ANAEROBIC BLOOD LEFT HAND Performed at [facility], [address] [place], [place] [address]    Culture   Final    NO GROWTH 5 DAYS Performed at [facility], [place] [address] [place], [place] [address]    Report Status 03/15/2021 FINAL  Final         Radiology Studies: IR PERCUTANEOUS TRANSHEPATIC CHOLANGIOGRAM  Result Date: 03/15/2021 INDICATION: History of choledocholithiasis, post attempted though ultimately unsuccessful ERCP. Patient subsequent underwent laparoscopic cholecystectomy with intraoperative cholangiogram demonstrating an occlusive stone within the distal aspect of the CBD. As such patient presents for image guided placement an internal/external biliary drainage catheter Note, the patient's LFTs have normalized and she has no significant intrahepatic biliary ductal dilatation on MRCP 03/11/2021 however Dr. Rush Landmark would like [place] to proceed with attempted internal/external biliary drainage catheter  placement. EXAM: ULTRASOUND AND FLUOROSCOPIC GUIDED TRANSHEPATIC CHOLANGIOGRAM COMPARISON:  MRCP-03/11/2021 MEDICATIONS: Patient is currently admitted to the hospital receiving intravenous antibiotics; The antibiotic was administered with an appropriate time frame prior to the initiation of the procedure CONTRAST:  15 cc Isovue-300 - administered into the biliary tree. ANESTHESIA/SEDATION: Moderate (conscious) sedation was employed during this procedure. Lynn Simpson total of Versed 8 mg, fentanyl 300 mcg and Dilaudid 1 mg was administered intravenously. Moderate Sedation Time: 70 minutes. The patient's level of consciousness and vital signs were  monitored continuously by radiology nursing throughout the procedure under my direct supervision. FLUOROSCOPY TIME:  18.4 minutes (6237 mGy) COMPLICATIONS: None immediate. TECHNIQUE: Informed written consent was obtained from the patient after Lynn Simpson discussion of the risks, benefits and alternatives to treatment. Questions regarding the procedure were encouraged and answered. Lynn Simpson timeout was performed prior to the initiation of the procedure. Sonographic evaluation performed redemonstrated no significant intrahepatic biliary ductal dilatation. Additionally, there is poor sonographic window of both the left and right lobe of the liver secondary to patient body habitus. The right upper abdominal quadrant and midline of the upper abdomen was prepped and draped in the usual sterile fashion, and Lynn Simpson sterile drape was applied covering the operative field. Maximum barrier sterile technique with sterile gowns and gloves were used for the procedure. Lynn Simpson timeout was performed prior to the initiation of the procedure. Under direct ultrasound guidance, efforts were made to cannulate Bahar Shelden nondilated duct within left lobe liver however this ultimately proved unsuccessful. Next, under ultrasound guidance, after which were made to cannulate Lynn Simpson nondilated duct within the peripheral aspect the right lobe liver  Contrast injection was able to opacify Lynn Simpson peripheral nondilated portion of the right intrahepatic biliary tree. Multiple spot fluoroscopic and radiographic images were obtained in various obliquities demonstrating patency of the CBD with passage of contrast to the level duodenum and without Lynn Simpson discrete residual filling defect to suggest residual choledocholithiasis. Prolonged efforts were made to cannulate the opacified right bile duct however this ultimately proved unsuccessful. The opacified bile duct was then targeted fluoroscopically however again, the duct could not be cannulated due to lack of dilatation as well as patient body habitus. At this time, the decision was made to cc further attempts at placement of an internal/external biliary drainage catheter. The procedure was terminated. Dressings were applied. The patient tolerated the procedure well without immediate postprocedural complication though was expectedly uncomfortable at the time of procedure completion. FINDINGS: Sonographic evaluation demonstrates no significant intrahepatic biliary ductal dilatation with poor sonographic window of both the right and left lobes liver secondary to patient body habitus and poor sonographic window. Ultimately, under Jovaun Levene combination of ultrasound and fluoroscopic guidance, the peripheral aspect of Lynn Simpson nondilated right intrahepatic biliary duct was opacified with intraoperative cholangiogram demonstrating restored patency of the CBD with passage of contrast to the level duodenum and without Lynn Simpson definitive residual filling defect to suggest residual choledocholithiasis. Despite prolonged efforts, the opacified biliary tree could not be successfully cannulated secondary to lack of intrahepatic biliary ductal dilatation. Redemonstrated pancreatic stent. IMPRESSION: 1. Attempted though ultimately unsuccessful placement of an internal/external biliary drainage catheter secondary to lack of intrahepatic biliary duct dilatation and  patient body habitus. 2. Intra procedural cholangiogram demonstrates restored patency of the CBD with passage of contrast to the level duodenum and without Dora Simeone discrete residual filling defect to suggest residual choledocholithiasis. Electronically Signed   By: Sandi Mariscal M.D.   On: 03/15/2021 17:48        Scheduled Meds:  bisacodyl  10 mg Rectal Once   diclofenac Sodium  2 g Topical QID   indomethacin  100 mg Rectal Once   pantoprazole  20 mg Oral Daily   polyethylene glycol  17 g Oral BID   senna  2 tablet Oral QHS   Continuous Infusions:  sodium chloride Stopped (03/14/21 1624)   lactated ringers 10 mL/hr at 03/13/21 0827   piperacillin-tazobactam (ZOSYN)  IV 3.375 g (03/16/21 1630)     LOS: 6 days    Time  spent: over 19 mni    Fayrene Helper, MD [facility]   To contact the attending provider between 7A-7P or the covering provider during after hours 7P-7A, please log into the web site [email or web address] and access using universal [place] password for that web site. If you do not have the password, please call the hospital operator.  03/16/2021, 6:52 PM

## 2021-03-16 NOTE — Progress Notes (Signed)
Patient is s/p attempted and failed ext/int biliary drain by IR yesterday.   Patient appears to be clinically improving, t bili is slightly lower today.  IR will remotely follow the patient, IR is available for further assistant at anytime.  Please call IR for questions and concerns.   Armando Gang Damiel Barthold PA-C 03/16/2021 4:18 PM

## 2021-03-17 ENCOUNTER — Encounter (HOSPITAL_COMMUNITY): Payer: Self-pay | Admitting: Gastroenterology

## 2021-03-17 ENCOUNTER — Inpatient Hospital Stay (HOSPITAL_COMMUNITY): Payer: BC Managed Care – PPO

## 2021-03-17 DIAGNOSIS — K819 Cholecystitis, unspecified: Secondary | ICD-10-CM | POA: Diagnosis not present

## 2021-03-17 DIAGNOSIS — R7989 Other specified abnormal findings of blood chemistry: Secondary | ICD-10-CM | POA: Diagnosis not present

## 2021-03-17 DIAGNOSIS — K839 Disease of biliary tract, unspecified: Secondary | ICD-10-CM | POA: Diagnosis not present

## 2021-03-17 DIAGNOSIS — K8042 Calculus of bile duct with acute cholecystitis without obstruction: Secondary | ICD-10-CM | POA: Diagnosis not present

## 2021-03-17 LAB — CBC WITH DIFFERENTIAL/PLATELET
Abs Immature Granulocytes: 0.05 10*3/uL (ref 0.00–0.07)
Basophils Absolute: 0 10*3/uL (ref 0.0–0.1)
Basophils Relative: 0 %
Eosinophils Absolute: 0.1 10*3/uL (ref 0.0–0.5)
Eosinophils Relative: 1 %
HCT: 38.8 % (ref 36.0–46.0)
Hemoglobin: 13.3 g/dL (ref 12.0–15.0)
Immature Granulocytes: 1 %
Lymphocytes Relative: 12 %
Lymphs Abs: 1.3 10*3/uL (ref 0.7–4.0)
MCH: 30.9 pg (ref 26.0–34.0)
MCHC: 34.3 g/dL (ref 30.0–36.0)
MCV: 90 fL (ref 80.0–100.0)
Monocytes Absolute: 0.7 10*3/uL (ref 0.1–1.0)
Monocytes Relative: 7 %
Neutro Abs: 8.6 10*3/uL — ABNORMAL HIGH (ref 1.7–7.7)
Neutrophils Relative %: 79 %
Platelets: 144 10*3/uL — ABNORMAL LOW (ref 150–400)
RBC: 4.31 MIL/uL (ref 3.87–5.11)
RDW: 12.4 % (ref 11.5–15.5)
WBC: 10.9 10*3/uL — ABNORMAL HIGH (ref 4.0–10.5)
nRBC: 0 % (ref 0.0–0.2)

## 2021-03-17 LAB — COMPREHENSIVE METABOLIC PANEL
ALT: 385 U/L — ABNORMAL HIGH (ref 0–44)
ALT: 379 U/L — ABNORMAL HIGH (ref 0–44)
AST: 135 U/L — ABNORMAL HIGH (ref 15–41)
AST: 133 U/L — ABNORMAL HIGH (ref 15–41)
Albumin: 3.6 g/dL (ref 3.5–5.0)
Albumin: 3.9 g/dL (ref 3.5–5.0)
Alkaline Phosphatase: 298 U/L — ABNORMAL HIGH (ref 38–126)
Alkaline Phosphatase: 338 U/L — ABNORMAL HIGH (ref 38–126)
Anion gap: 8 (ref 5–15)
Anion gap: 8 (ref 5–15)
BUN: 7 mg/dL (ref 6–20)
BUN: 6 mg/dL (ref 6–20)
CO2: 26 mmol/L (ref 22–32)
CO2: 29 mmol/L (ref 22–32)
Calcium: 9.2 mg/dL (ref 8.9–10.3)
Calcium: 9.3 mg/dL (ref 8.9–10.3)
Chloride: 98 mmol/L (ref 98–111)
Chloride: 96 mmol/L — ABNORMAL LOW (ref 98–111)
Creatinine, Ser: 0.57 mg/dL (ref 0.44–1.00)
Creatinine, Ser: 0.46 mg/dL (ref 0.44–1.00)
GFR, Estimated: >60 mL/min (ref >60)
GFR, Estimated: >60 mL/min (ref >60)
Glucose, Bld: 122 mg/dL — ABNORMAL HIGH (ref 70–99)
Glucose, Bld: 107 mg/dL — ABNORMAL HIGH (ref 70–99)
Potassium: 3.8 mmol/L (ref 3.5–5.1)
Potassium: 3.8 mmol/L (ref 3.5–5.1)
Sodium: 132 mmol/L — ABNORMAL LOW (ref 135–145)
Sodium: 133 mmol/L — ABNORMAL LOW (ref 135–145)
Total Bilirubin: 1.9 mg/dL — ABNORMAL HIGH (ref 0.3–1.2)
Total Bilirubin: 2.2 mg/dL — ABNORMAL HIGH (ref 0.3–1.2)
Total Protein: 6.9 g/dL (ref 6.5–8.1)
Total Protein: 7.5 g/dL (ref 6.5–8.1)

## 2021-03-17 LAB — MAGNESIUM: Magnesium: 1.9 mg/dL (ref 1.7–2.4)

## 2021-03-17 LAB — PHOSPHORUS: Phosphorus: 3.5 mg/dL (ref 2.5–4.6)

## 2021-03-17 IMAGING — MR MR ABDOMEN WO/W CM MRCP
19 of 21 series · 45 of 48 positions shown · IV contrast (gadavist)
Comparison: MRCP [DATE], ERCP [DATE] and intraoperative
cholangiogram [DATE].

CLINICAL DATA: Abdominal pain with elevated liver function studies
status post recent cholecystectomy and ERCP. Biliary obstruction
suspected. Choledocholithiasis on previous intraoperative
cholangiogram with unsuccessful internal/external biliary drainage
catheter placement 2 days prior.

EXAM:
MRI ABDOMEN WITHOUT AND WITH CONTRAST (INCLUDING MRCP)
TECHNIQUE: Multiplanar multisequence MR imaging of the abdomen was performed
both before and after the administration of intravenous contrast.
Heavily T2-weighted images of the biliary and pancreatic ducts were
obtained, and three-dimensional MRCP images were rendered by post
processing.
CONTRAST:  10mL GADAVIST GADOBUTROL 1 MMOL/ML IV SOLN

[Series 2: DWI · axial · 6.0mm · 1.60mm/px · z∈[-136,+173]mm · 2 of 88 slices shown (1 of 2)]
[im 1/88]
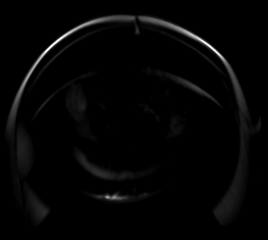
[im 88/88]
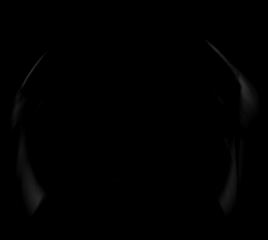

[Series 3: DWI · axial · 6.0mm · 1.60mm/px · 1 of 44 slices shown (2 of 2)]
[im 1/44]
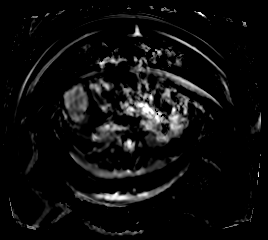

[Series 4: T2 fat-sat · axial · 6.0mm · 1.34mm/px · 1 of 42 slices shown]
[im 1/42]
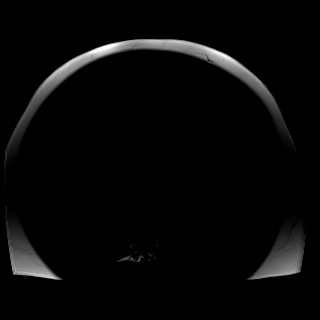

[Series 7: cor_3d_spc_trig · coronal · 1.0mm · 0.67mm/px · 3 of 70 slices shown]
[im 1/70]
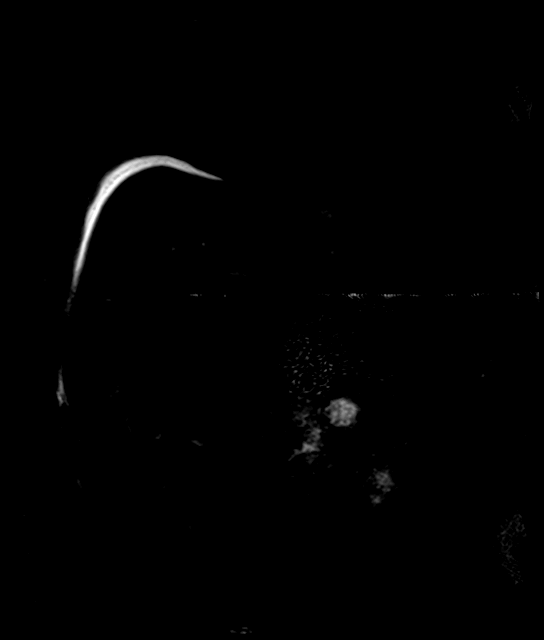
[im 35/70]
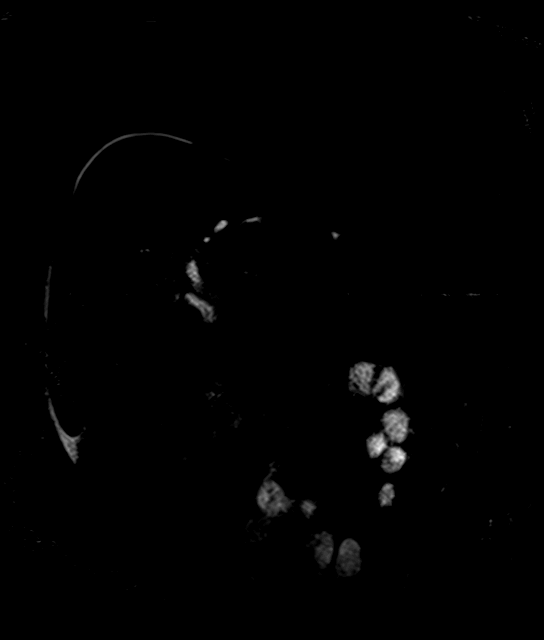
[im 70/70]
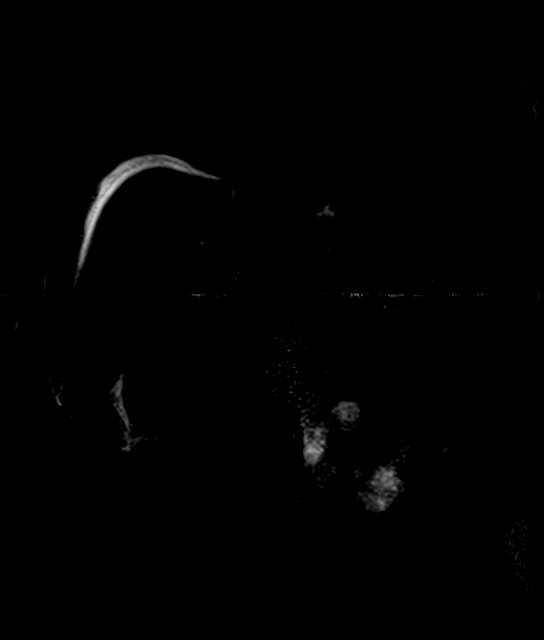

[Series 10: T2 · coronal · 6.0mm · 1.68mm/px · 1 of 40 slices shown (1 of 2)]
[im 1/40]
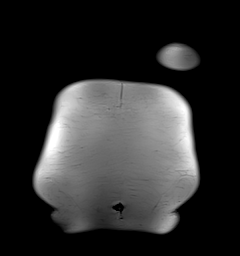

[Series 12: T1 · axial · 3.6mm · 1.38mm/px · z∈[-126,+159]mm · 3 of 80 slices shown (1 of 2)]
[im 1/80]
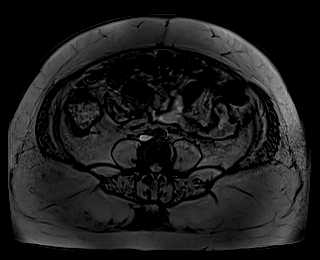
[im 40/80]
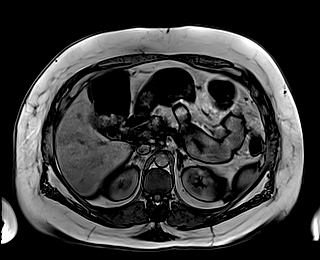
[im 80/80]
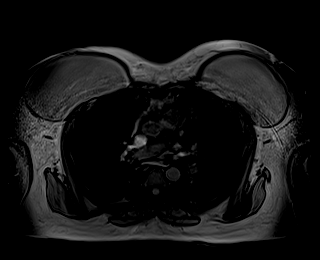

[Series 13: T1 · axial · 3.6mm · 1.38mm/px · z∈[-126,+159]mm · 3 of 80 slices shown (2 of 2)]
[im 1/80]
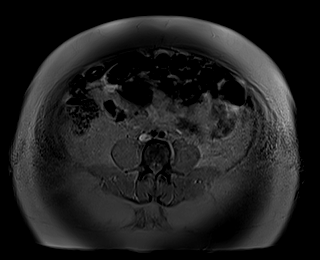
[im 40/80]
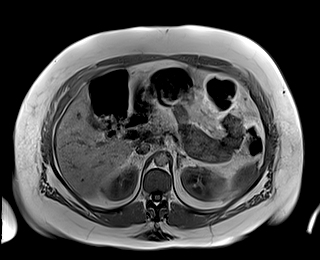
[im 80/80]
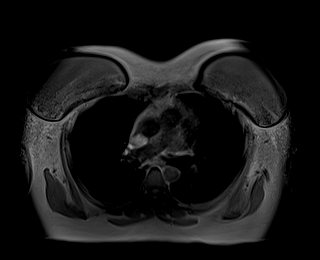

[Series 14: cor obl thk · sagittal · 50.0mm · 0.78mm/px · 1 of 9 slices shown]
[im 1/9]
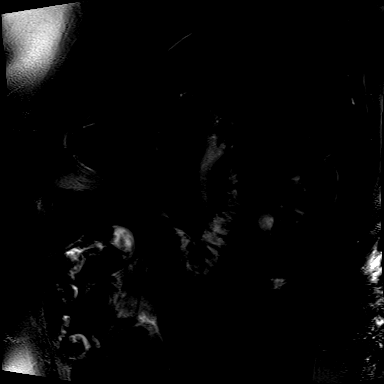

[Series 15: T2 · axial · 6.0mm · 1.68mm/px · 1 of 40 slices shown (2 of 2)]
[im 1/40]
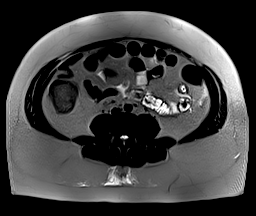

[Series 18: T1 dynamic · axial · 3.5mm · 1.34mm/px · z∈[-114,+162]mm · 3 of 80 slices shown (1 of 10)]
[im 1/80]
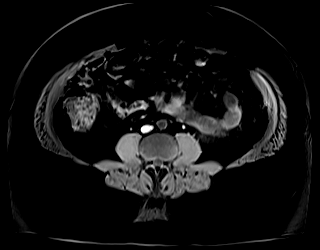
[im 40/80]
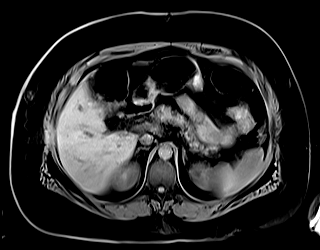
[im 80/80]
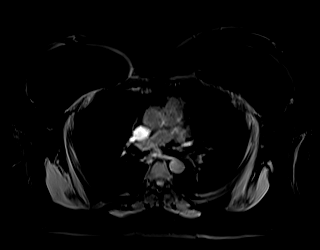

[Series 22: T1 dynamic · axial · 3.5mm · 1.34mm/px · z∈[-114,+162]mm · 3 of 80 slices shown (2 of 10)]
[im 1/80]
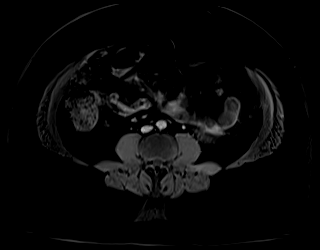
[im 40/80]
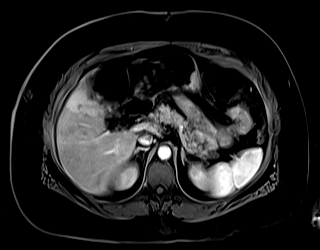
[im 80/80]
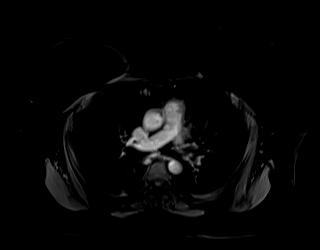

[Series 23: T1 dynamic · axial · 3.5mm · 1.34mm/px · z∈[-114,+162]mm · 3 of 80 slices shown (3 of 10)]
[im 1/80]
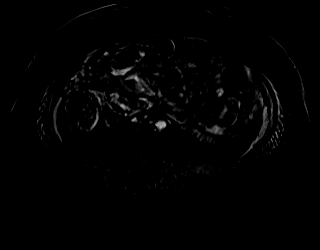
[im 40/80]
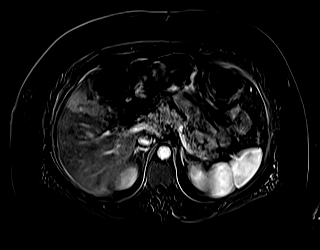
[im 80/80]
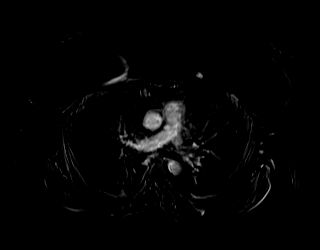

[Series 26: T1 dynamic · axial · 3.5mm · 1.34mm/px · z∈[-114,+162]mm · 3 of 80 slices shown (4 of 10)]
[im 1/80]
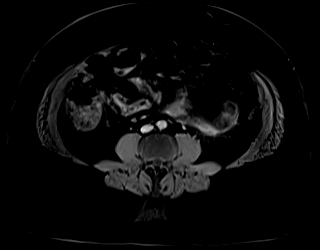
[im 40/80]
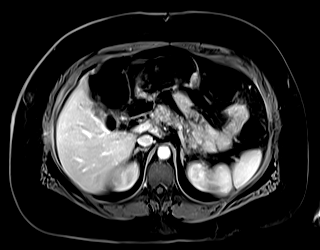
[im 80/80]
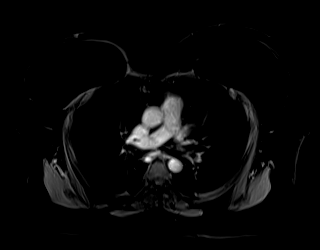

[Series 27: T1 dynamic · axial · 3.5mm · 1.34mm/px · z∈[-114,+162]mm · 3 of 80 slices shown (5 of 10)]
[im 1/80]
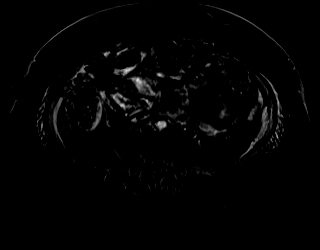
[im 40/80]
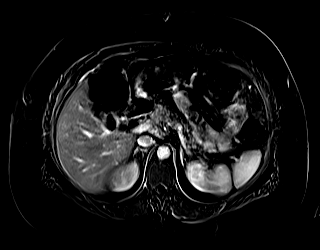
[im 80/80]
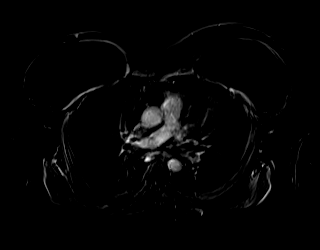

[Series 30: T1 dynamic · axial · 3.5mm · 1.34mm/px · z∈[-114,+162]mm · 3 of 80 slices shown (6 of 10)]
[im 1/80]
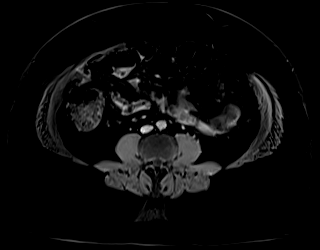
[im 40/80]
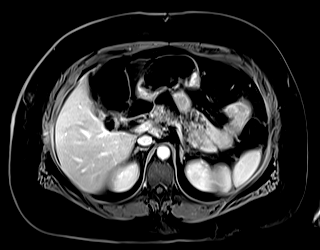
[im 80/80]
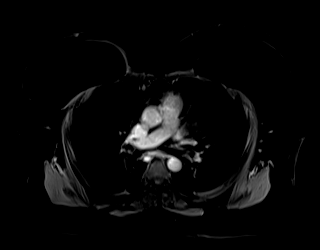

[Series 31: T1 dynamic · axial · 3.5mm · 1.34mm/px · z∈[-114,+162]mm · 3 of 80 slices shown (7 of 10)]
[im 1/80]
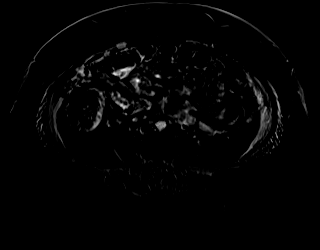
[im 40/80]
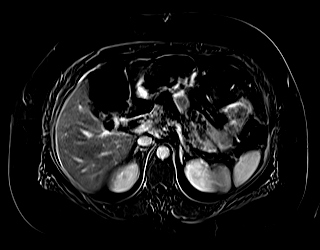
[im 80/80]
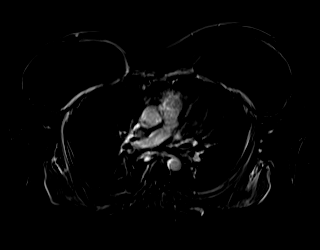

[Series 33: T1 dynamic · coronal · 5.0mm · 1.41mm/px · 2 of 60 slices shown (8 of 10)]
[im 1/60]
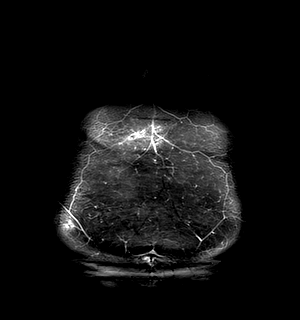
[im 60/60]
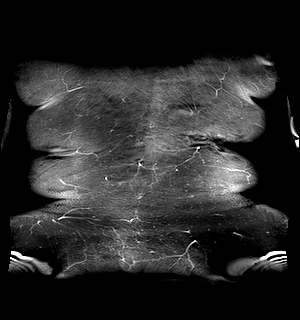

[Series 36: T1 dynamic · axial · 3.5mm · 1.34mm/px · z∈[-114,+162]mm · 3 of 80 slices shown (9 of 10)]
[im 1/80]
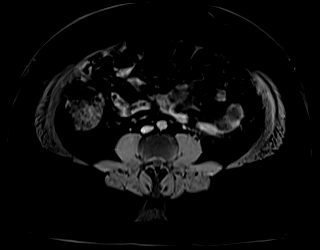
[im 40/80]
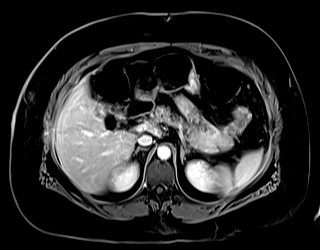
[im 80/80]
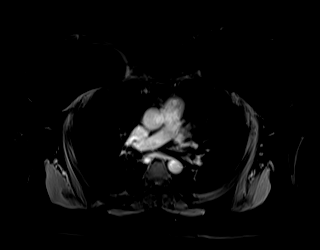

[Series 37: T1 dynamic · axial · 3.5mm · 1.34mm/px · z∈[-114,+162]mm · 3 of 80 slices shown (10 of 10)]
[im 1/80]
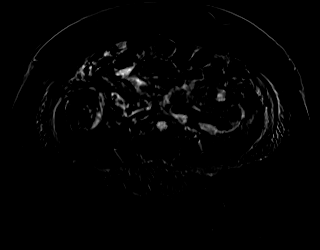
[im 40/80]
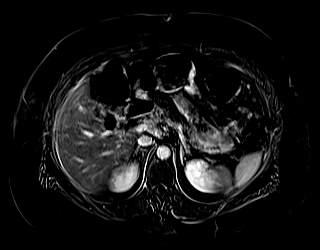
[im 80/80]
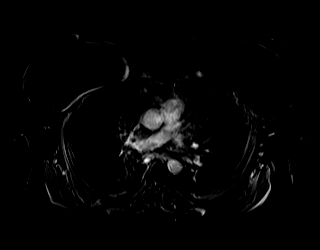

[45 of 48 positions shown; findings below may reference images not displayed]

FINDINGS: Lower chest: Mild dependent atelectasis at both lung bases. No
significant pleural effusion. Bilateral breast implants are noted.

Hepatobiliary: Interval cholecystectomy without significant focal
fluid collection in the cholecystectomy bed. There is a moderate
amount of perihepatic fluid which is new. A small peripherally
enhancing collection in the right hepatic lobe measures 1.3 cm on
image 33/30, not present previously and likely related to attempted
percutaneous biliary drainage catheter placement. There is mild
intra and extrahepatic biliary dilatation with the common hepatic
duct measuring up to 12 mm in diameter. There are 2 probable
dependent punctate stones in the distal common bile duct, best seen
on coronal image [DATE].

Pancreas: Unremarkable. No evidence of pancreatic mass, pancreatic
ductal dilatation or surrounding inflammation.

Spleen: Normal in size without focal abnormality.

Adrenals/Urinary Tract: Both adrenal glands appear normal. Stable
small left renal cyst. No hydronephrosis.

Stomach/Bowel: The stomach appears unremarkable for its degree of
distension. No evidence of bowel wall thickening, distention or
surrounding inflammatory change.

Vascular/Lymphatic: There are no enlarged abdominal lymph nodes. No
significant vascular findings.

Other: As above, new ascites, primarily around the liver.

Musculoskeletal: No acute or significant osseous findings.
IMPRESSION: 1. Persistent mild biliary dilatation status post cholecystectomy
with probable small punctate distal common bile duct stones.
2. New ascites, primarily around the liver. Although nonspecific, in
the setting of recent cholecystectomy, this is suspicious for a bile
leak. This could be further evaluated with nuclear medicine
hepatobiliary scan.
3. New small collection centrally in the right hepatic lobe
attributed to recent attempted percutaneous biliary drainage
catheter placement.
4. No evidence of pancreatitis or pancreatic ductal dilatation.

## 2021-03-17 IMAGING — MR MR 3D RECON AT [facility]ANNER
19 of 21 series · 45 of 48 positions shown · IV contrast (gadavist)
Comparison: MRCP [DATE], ERCP [DATE] and intraoperative
cholangiogram [DATE].

CLINICAL DATA: Abdominal pain with elevated liver function studies
status post recent cholecystectomy and ERCP. Biliary obstruction
suspected. Choledocholithiasis on previous intraoperative
cholangiogram with unsuccessful internal/external biliary drainage
catheter placement 2 days prior.

EXAM:
MRI ABDOMEN WITHOUT AND WITH CONTRAST (INCLUDING MRCP)
TECHNIQUE: Multiplanar multisequence MR imaging of the abdomen was performed
both before and after the administration of intravenous contrast.
Heavily T2-weighted images of the biliary and pancreatic ducts were
obtained, and three-dimensional MRCP images were rendered by post
processing.
CONTRAST:  10mL GADAVIST GADOBUTROL 1 MMOL/ML IV SOLN

[Series 2: DWI · axial · 6.0mm · 1.60mm/px · z∈[-136,+173]mm · 2 of 88 slices shown (1 of 2)]
[im 1/88]
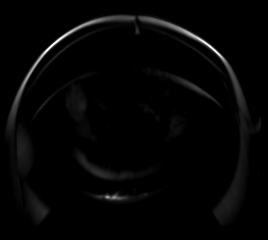
[im 88/88]
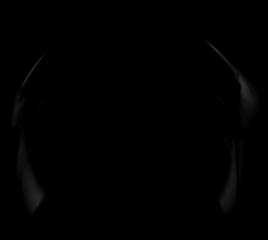

[Series 3: DWI · axial · 6.0mm · 1.60mm/px · 1 of 44 slices shown (2 of 2)]
[im 1/44]
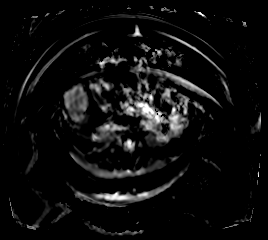

[Series 4: T2 fat-sat · axial · 6.0mm · 1.34mm/px · 1 of 42 slices shown]
[im 1/42]
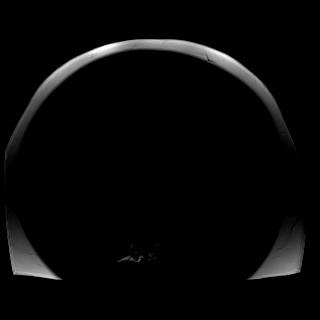

[Series 7: cor_3d_spc_trig · coronal · 1.0mm · 0.67mm/px · 3 of 70 slices shown]
[im 1/70]
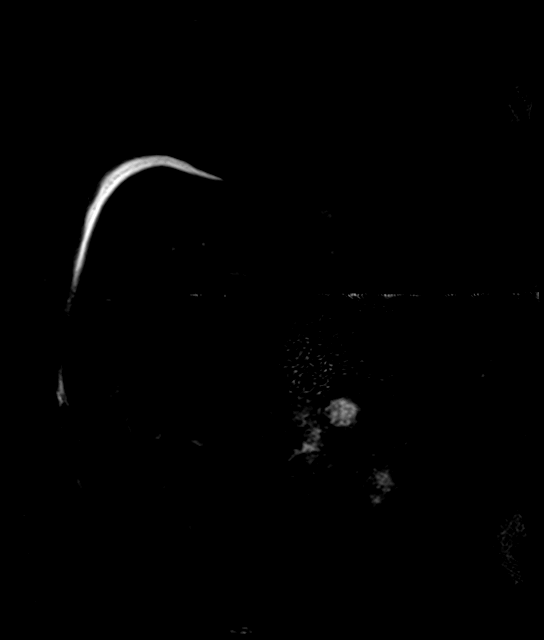
[im 35/70]
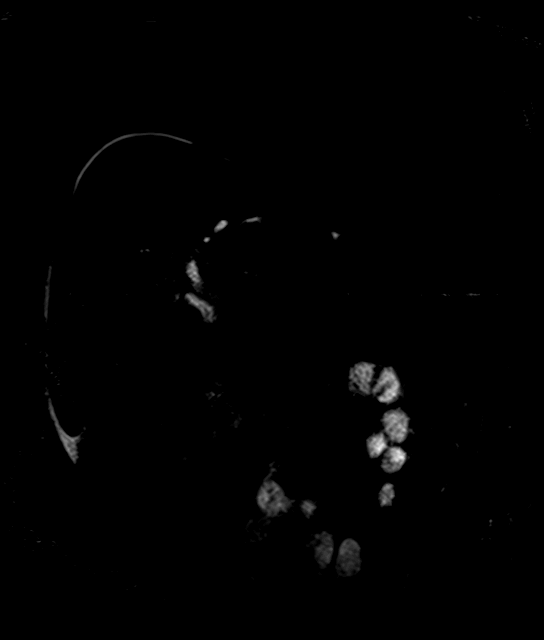
[im 70/70]
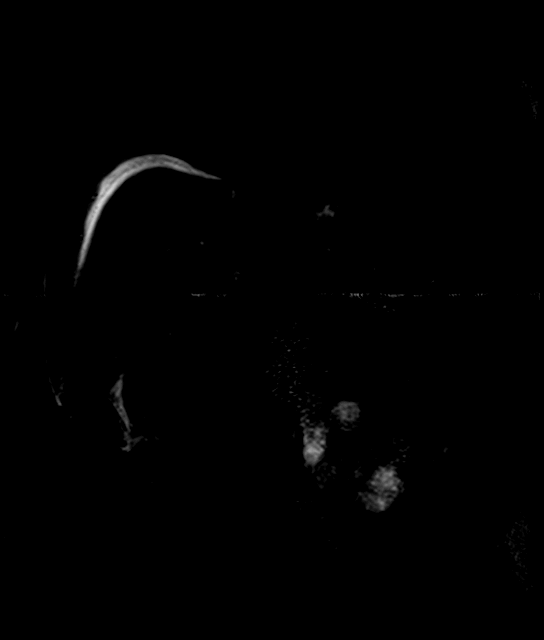

[Series 10: T2 · coronal · 6.0mm · 1.68mm/px · 1 of 40 slices shown (1 of 2)]
[im 1/40]
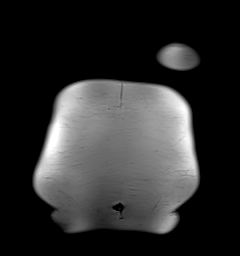

[Series 12: T1 · axial · 3.6mm · 1.38mm/px · z∈[-126,+159]mm · 3 of 80 slices shown (1 of 2)]
[im 1/80]
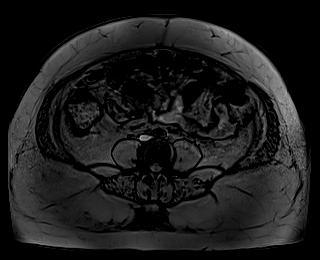
[im 40/80]
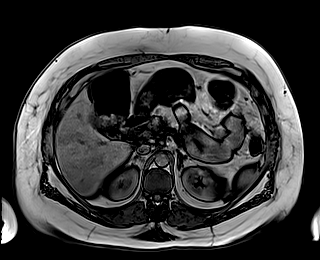
[im 80/80]
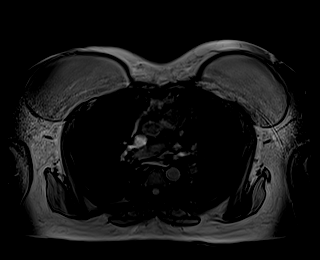

[Series 13: T1 · axial · 3.6mm · 1.38mm/px · z∈[-126,+159]mm · 3 of 80 slices shown (2 of 2)]
[im 1/80]
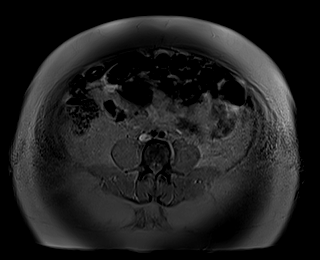
[im 40/80]
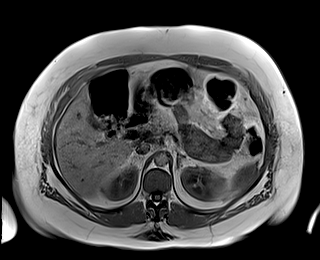
[im 80/80]
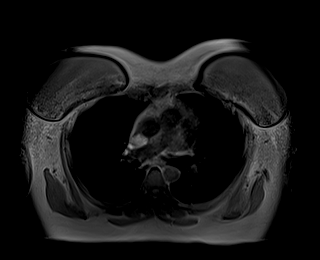

[Series 14: cor obl thk · sagittal · 50.0mm · 0.78mm/px · 1 of 9 slices shown]
[im 1/9]
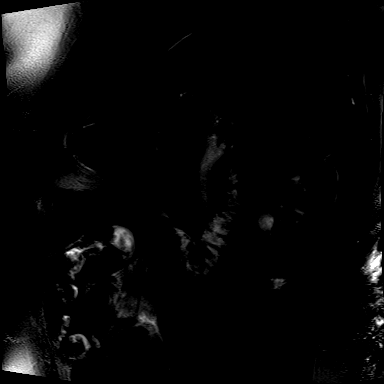

[Series 15: T2 · axial · 6.0mm · 1.68mm/px · 1 of 40 slices shown (2 of 2)]
[im 1/40]
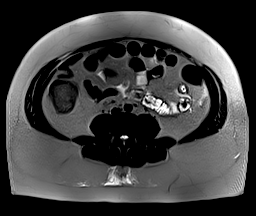

[Series 18: T1 dynamic · axial · 3.5mm · 1.34mm/px · z∈[-114,+162]mm · 3 of 80 slices shown (1 of 10)]
[im 1/80]
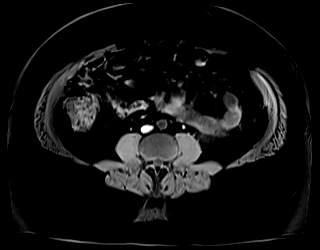
[im 40/80]
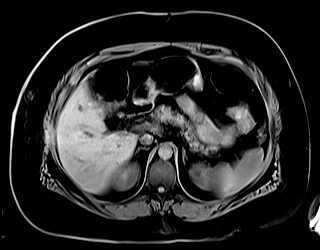
[im 80/80]
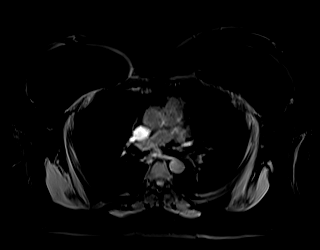

[Series 22: T1 dynamic · axial · 3.5mm · 1.34mm/px · z∈[-114,+162]mm · 3 of 80 slices shown (2 of 10)]
[im 1/80]
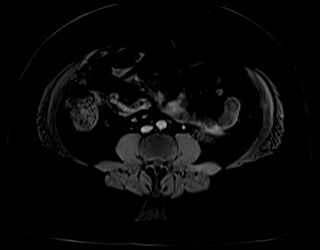
[im 40/80]
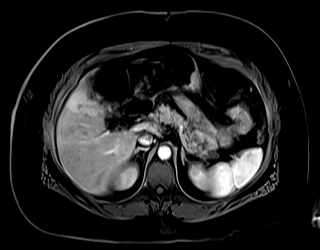
[im 80/80]
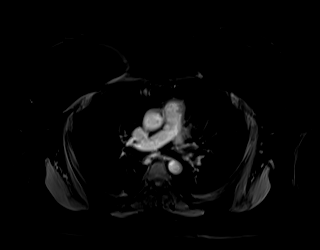

[Series 23: T1 dynamic · axial · 3.5mm · 1.34mm/px · z∈[-114,+162]mm · 3 of 80 slices shown (3 of 10)]
[im 1/80]
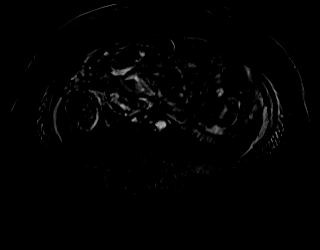
[im 40/80]
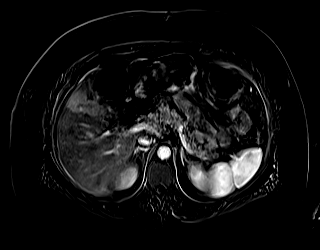
[im 80/80]
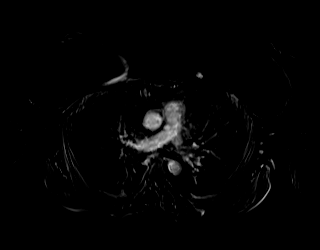

[Series 26: T1 dynamic · axial · 3.5mm · 1.34mm/px · z∈[-114,+162]mm · 3 of 80 slices shown (4 of 10)]
[im 1/80]
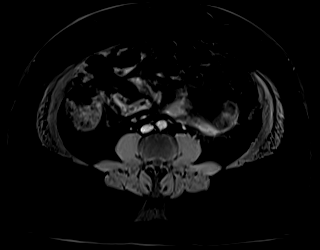
[im 40/80]
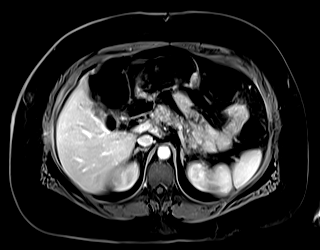
[im 80/80]
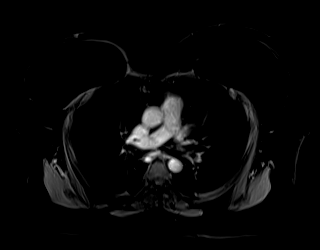

[Series 27: T1 dynamic · axial · 3.5mm · 1.34mm/px · z∈[-114,+162]mm · 3 of 80 slices shown (5 of 10)]
[im 1/80]
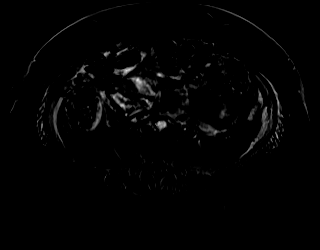
[im 40/80]
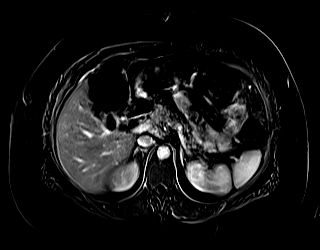
[im 80/80]
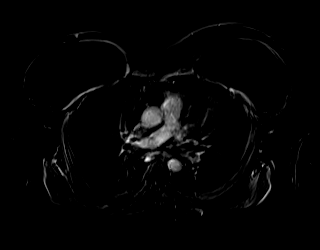

[Series 30: T1 dynamic · axial · 3.5mm · 1.34mm/px · z∈[-114,+162]mm · 3 of 80 slices shown (6 of 10)]
[im 1/80]
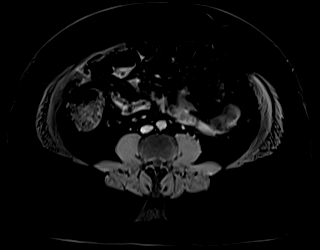
[im 40/80]
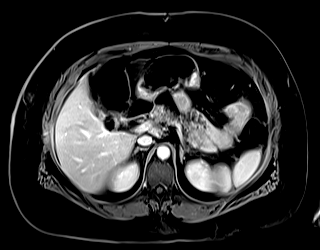
[im 80/80]
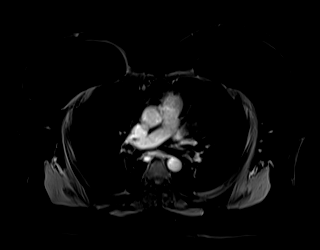

[Series 31: T1 dynamic · axial · 3.5mm · 1.34mm/px · z∈[-114,+162]mm · 3 of 80 slices shown (7 of 10)]
[im 1/80]
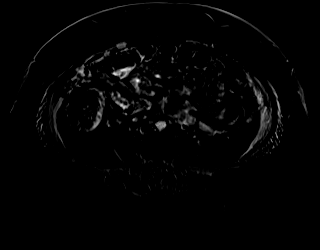
[im 40/80]
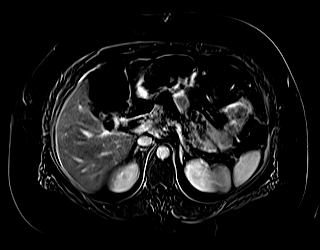
[im 80/80]
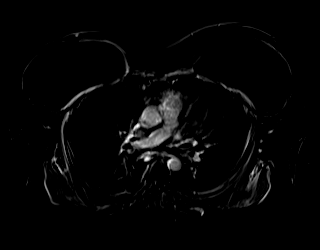

[Series 33: T1 dynamic · coronal · 5.0mm · 1.41mm/px · 2 of 60 slices shown (8 of 10)]
[im 1/60]
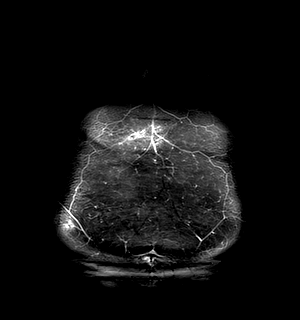
[im 60/60]
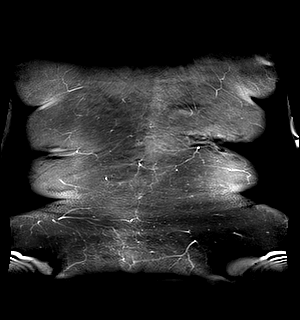

[Series 36: T1 dynamic · axial · 3.5mm · 1.34mm/px · z∈[-114,+162]mm · 3 of 80 slices shown (9 of 10)]
[im 1/80]
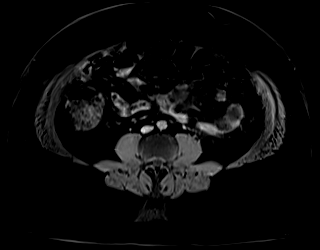
[im 40/80]
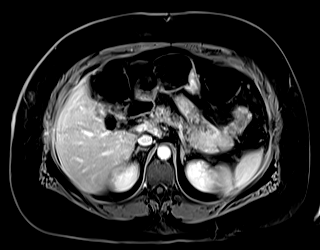
[im 80/80]
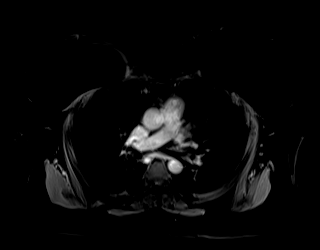

[Series 37: T1 dynamic · axial · 3.5mm · 1.34mm/px · z∈[-114,+162]mm · 3 of 80 slices shown (10 of 10)]
[im 1/80]
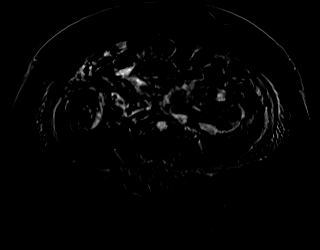
[im 40/80]
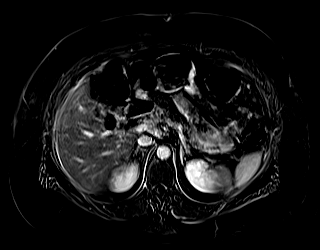
[im 80/80]
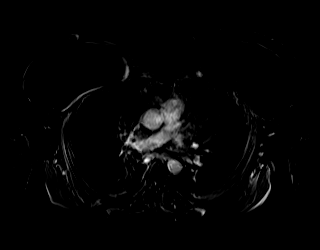

[45 of 48 positions shown; findings below may reference images not displayed]

FINDINGS: Lower chest: Mild dependent atelectasis at both lung bases. No
significant pleural effusion. Bilateral breast implants are noted.

Hepatobiliary: Interval cholecystectomy without significant focal
fluid collection in the cholecystectomy bed. There is a moderate
amount of perihepatic fluid which is new. A small peripherally
enhancing collection in the right hepatic lobe measures 1.3 cm on
image 33/30, not present previously and likely related to attempted
percutaneous biliary drainage catheter placement. There is mild
intra and extrahepatic biliary dilatation with the common hepatic
duct measuring up to 12 mm in diameter. There are 2 probable
dependent punctate stones in the distal common bile duct, best seen
on coronal image [DATE].

Pancreas: Unremarkable. No evidence of pancreatic mass, pancreatic
ductal dilatation or surrounding inflammation.

Spleen: Normal in size without focal abnormality.

Adrenals/Urinary Tract: Both adrenal glands appear normal. Stable
small left renal cyst. No hydronephrosis.

Stomach/Bowel: The stomach appears unremarkable for its degree of
distension. No evidence of bowel wall thickening, distention or
surrounding inflammatory change.

Vascular/Lymphatic: There are no enlarged abdominal lymph nodes. No
significant vascular findings.

Other: As above, new ascites, primarily around the liver.

Musculoskeletal: No acute or significant osseous findings.
IMPRESSION: 1. Persistent mild biliary dilatation status post cholecystectomy
with probable small punctate distal common bile duct stones.
2. New ascites, primarily around the liver. Although nonspecific, in
the setting of recent cholecystectomy, this is suspicious for a bile
leak. This could be further evaluated with nuclear medicine
hepatobiliary scan.
3. New small collection centrally in the right hepatic lobe
attributed to recent attempted percutaneous biliary drainage
catheter placement.
4. No evidence of pancreatitis or pancreatic ductal dilatation.

## 2021-03-17 MED ORDER — SODIUM CHLORIDE 0.9 % IV SOLN: INTRAVENOUS | Status: DC

## 2021-03-17 MED ORDER — GADOBUTROL 1 MMOL/ML IV SOLN
10.0000 mL | Freq: Once | INTRAVENOUS | Status: AC | PRN
  Administered 2021-03-17: 15:00:00 10 mL via INTRAVENOUS

## 2021-03-17 MED ORDER — SODIUM CHLORIDE 0.9 % IV SOLN: 2.0000 g | Freq: Once | INTRAVENOUS | Status: DC

## 2021-03-17 MED ORDER — ALUM & MAG HYDROXIDE-SIMETH 200-200-20 MG/5ML PO SUSP
30.0000 mL | Freq: Four times a day (QID) | ORAL | Status: DC | PRN
  Administered 2021-03-17: 06:00:00 30 mL via ORAL
  Filled 2021-03-17: qty 30

## 2021-03-17 MED ORDER — METOPROLOL TARTRATE 5 MG/5ML IV SOLN
2.5000 mg | Freq: Once | INTRAVENOUS | Status: AC
  Administered 2021-03-17: 05:00:00 2.5 mg via INTRAVENOUS
  Filled 2021-03-17: qty 5

## 2021-03-17 MED ORDER — SODIUM CHLORIDE 0.9 % IV SOLN
25.0000 mg | Freq: Four times a day (QID) | INTRAVENOUS | Status: DC | PRN
  Administered 2021-03-17 – 2021-03-18 (×2): 25 mg via INTRAVENOUS
  Filled 2021-03-17 (×2): qty 25

## 2021-03-17 NOTE — Progress Notes (Addendum)
Progress Note Hospital Day: 8  Chief Complaint:    abdominal pain, abnormal liver tests, CBD stone     Attending physician's note   Reviewed and agree with documentation and assessment and plan. Patient is an MRI getting MRCP.  Await results Continue to monitor LFT  K. Denzil Magnuson , MD [phone]      ASSESSMENT AND PLAN   Brief History: 50 yo female with past medical history of lynch syndrome / multiple tubular adenomas and sessile serrated colon polyps, bilateral mastectomies, IBS, diverticulosis, GERD, asthma. Admitted 12/21 with biliary obstruction /  cholelithiasis / acute cholecystitis / choledocholithiasis   # Cholelithiasis / choledocholithiasis. Incomplete ERCP 12/23 --> biliary cannulation was not possible. Temporary plastic biliary stent placed.  Underwent lap chole with IOC on 12/24, stone unable to be pushed out during surgery. Interventional Radiology attempted PBD 12/26 but unsuccessful due to lack of significant intrahepatic biliary ductal dilation, body habitus and poor sonographic window. Intra procedural cholangiogram showed patent CBD without discrete residual filling defect to suggest residual choledocholithiasis.  --AST and ALT continue to improve. Worsening cholestasis. Alk phos up overnight from 270 >> 298. Tbili 1.4 >>1.9. Having constant RUQ pain ( different from any pain she was having at home). Will proceed with MRCP to reevaluate bile duct  # Lower abdominal discomfort, improving with constipation treatment.        DIAGNOSTIC STUDIES THIS ADMISSION:   05/12/20 MRI / MRCP  Cholelithiasis without superimposed inflammatory change to suggest acute cholecystitis.   Choledocholithiasis with at least 3 small calculi measuring up to 3 mm in diameter within the distal common duct. Mild superimposed extrahepatic biliary ductal dilation    SUBJECTIVE   Constant RUQ pain, different than any pain she was having at home. Pain not related to eating.  Pain sometimes stabbing, hurts to take a deep breath. Had some BMs with Miralax yesterday and lower abdominal pain improves. Still with some discomfort and says she is about to get an enema.       OBJECTIVE      Scheduled inpatient medications:   diclofenac Sodium  2 g Topical QID   indomethacin  100 mg Rectal Once   pantoprazole  20 mg Oral Daily   polyethylene glycol  17 g Oral BID   senna  2 tablet Oral QHS   Continuous inpatient infusions:   sodium chloride Stopped (03/14/21 1624)   lactated ringers 10 mL/hr at 03/13/21 0827   piperacillin-tazobactam (ZOSYN)  IV 3.375 g (03/17/21 0731)   PRN inpatient medications: acetaminophen **OR** acetaminophen, alum & mag hydroxide-simeth, bisacodyl, clonazepam, docusate sodium, HYDROmorphone (DILAUDID) injection, iohexol, lidocaine, lip balm, midazolam, Muscle Rub, ondansetron **OR** ondansetron (ZOFRAN) IV, oxyCODONE **OR** oxyCODONE, oxyCODONE-acetaminophen, traZODone  Vital signs in last 24 hours: Temp:  [97.7 F (36.5 C)-98.3 F (36.8 C)] 98.3 F (36.8 C) (12/28 0435) Pulse Rate:  [73-100] 96 (12/28 0600) Resp:  [16-20] 20 (12/28 0435) BP: (167-187)/(93-117) 187/117 (12/28 0600) SpO2:  [91 %-94 %] 91 % (12/28 0600) Last BM Date: 03/15/21  Intake/Output Summary (Last 24 hours) at 03/17/2021 1136 Last data filed at 03/16/2021 1300 Gross per 24 hour  Intake 240 ml  Output --  Net 240 ml     Physical Exam:  General: Alert female in NAD Heart:  Regular rate and rhythm. No lower extremity edema Pulmonary: Normal respiratory effort Abdomen: Soft, nondistended, moderate RUQ tenderness, no peritoneal signs, a few bowel sounds.   Neurologic: Alert and oriented Psych: Pleasant. Cooperative.  Filed Weights   03/10/21 0949  Weight: 115.7 kg    Intake/Output from previous day: 12/27 0701 - 12/28 0700 In: 240 [P.O.:240] Out: -  Intake/Output this shift: No intake/output data recorded.    Lab Results: Recent Labs     03/15/21 0455 03/16/21 0448 03/17/21 0518  WBC 6.3 9.0 10.9*  HGB 13.1 13.1 13.3  HCT 40.1 40.1 38.8  PLT 149* 148* 144*   BMET Recent Labs    03/15/21 0455 03/16/21 0448 03/17/21 0518  NA 140 135 132*  K 3.8 3.9 3.8  CL 107 102 98  CO2 '23 26 26  ' GLUCOSE 97 110* 122*  BUN '8 8 7  ' CREATININE 0.81 0.59 0.57  CALCIUM 8.6* 8.8* 9.2   LFT Recent Labs    03/17/21 0518  PROT 6.9  ALBUMIN 3.6  AST 135*  ALT 385*  ALKPHOS 298*  BILITOT 1.9*   PT/INR Recent Labs    03/15/21 0455  LABPROT 12.2  INR 0.9   Hepatitis Panel No results for input(s): HEPBSAG, HCVAB, HEPAIGM, HEPBIGM in the last 72 hours.  IR PERCUTANEOUS TRANSHEPATIC CHOLANGIOGRAM  Result Date: 03/15/2021 INDICATION: History of choledocholithiasis, post attempted though ultimately unsuccessful ERCP. Patient subsequent underwent laparoscopic cholecystectomy with intraoperative cholangiogram demonstrating an occlusive stone within the distal aspect of the CBD. As such patient presents for image guided placement an internal/external biliary drainage catheter Note, the patient's LFTs have normalized and she has no significant intrahepatic biliary ductal dilatation on MRCP 03/11/2021 however Dr. Rush Landmark would like [place] to proceed with attempted internal/external biliary drainage catheter placement. EXAM: ULTRASOUND AND FLUOROSCOPIC GUIDED TRANSHEPATIC CHOLANGIOGRAM COMPARISON:  MRCP-03/11/2021 MEDICATIONS: Patient is currently admitted to the hospital receiving intravenous antibiotics; The antibiotic was administered with an appropriate time frame prior to the initiation of the procedure CONTRAST:  15 cc Isovue-300 - administered into the biliary tree. ANESTHESIA/SEDATION: Moderate (conscious) sedation was employed during this procedure. A total of Versed 8 mg, fentanyl 300 mcg and Dilaudid 1 mg was administered intravenously. Moderate Sedation Time: 70 minutes. The patient's level of consciousness and vital signs were  monitored continuously by radiology nursing throughout the procedure under my direct supervision. FLUOROSCOPY TIME:  18.4 minutes (8003 mGy) COMPLICATIONS: None immediate. TECHNIQUE: Informed written consent was obtained from the patient after a discussion of the risks, benefits and alternatives to treatment. Questions regarding the procedure were encouraged and answered. A timeout was performed prior to the initiation of the procedure. Sonographic evaluation performed redemonstrated no significant intrahepatic biliary ductal dilatation. Additionally, there is poor sonographic window of both the left and right lobe of the liver secondary to patient body habitus. The right upper abdominal quadrant and midline of the upper abdomen was prepped and draped in the usual sterile fashion, and a sterile drape was applied covering the operative field. Maximum barrier sterile technique with sterile gowns and gloves were used for the procedure. A timeout was performed prior to the initiation of the procedure. Under direct ultrasound guidance, efforts were made to cannulate a nondilated duct within left lobe liver however this ultimately proved unsuccessful. Next, under ultrasound guidance, after which were made to cannulate a nondilated duct within the peripheral aspect the right lobe liver Contrast injection was able to opacify a peripheral nondilated portion of the right intrahepatic biliary tree. Multiple spot fluoroscopic and radiographic images were obtained in various obliquities demonstrating patency of the CBD with passage of contrast to the level duodenum and without a discrete residual filling defect to suggest residual choledocholithiasis. Prolonged  efforts were made to cannulate the opacified right bile duct however this ultimately proved unsuccessful. The opacified bile duct was then targeted fluoroscopically however again, the duct could not be cannulated due to lack of dilatation as well as patient body habitus.  At this time, the decision was made to cc further attempts at placement of an internal/external biliary drainage catheter. The procedure was terminated. Dressings were applied. The patient tolerated the procedure well without immediate postprocedural complication though was expectedly uncomfortable at the time of procedure completion. FINDINGS: Sonographic evaluation demonstrates no significant intrahepatic biliary ductal dilatation with poor sonographic window of both the right and left lobes liver secondary to patient body habitus and poor sonographic window. Ultimately, under a combination of ultrasound and fluoroscopic guidance, the peripheral aspect of a nondilated right intrahepatic biliary duct was opacified with intraoperative cholangiogram demonstrating restored patency of the CBD with passage of contrast to the level duodenum and without a definitive residual filling defect to suggest residual choledocholithiasis. Despite prolonged efforts, the opacified biliary tree could not be successfully cannulated secondary to lack of intrahepatic biliary ductal dilatation. Redemonstrated pancreatic stent. IMPRESSION: 1. Attempted though ultimately unsuccessful placement of an internal/external biliary drainage catheter secondary to lack of intrahepatic biliary duct dilatation and patient body habitus. 2. Intra procedural cholangiogram demonstrates restored patency of the CBD with passage of contrast to the level duodenum and without a discrete residual filling defect to suggest residual choledocholithiasis. Electronically Signed   By: Sandi Mariscal M.D.   On: 03/15/2021 17:48        Principal Problem:   Choledocholithiasis with acute cholecystitis Active Problems:   Elevated LFTs     LOS: 7 days   Tye Savoy ,NP 03/17/2021, 11:36 AM

## 2021-03-17 NOTE — Progress Notes (Addendum)
PROGRESS NOTE    REATHA SUR   XNA:[id number]  DOB: 31-Dec-1970  DOA: 03/10/2021 PCP: Haywood Pao, MD   Brief Narrative:  Lynn Simpson is 50 y/o with Lynch syndrome, history of prophylactic bilateral mastectomy due to family history of breast cancer who presented on 12/21 for vomiting and abdominal pain and found to have choledocholithiasis and acute cholecystitis.  ERCP 03/12/2021: - J-shaped deformity of the stomach. - The major papilla was adjacent to a diverticulum. - The major papilla appeared congested and had a hood and excess tissue in place. - Only standard ERCP scope position could be used to evaluate the ampullary region (semilong/ long position did not allow for any visualization of the papilla). - One temporary plastic pancreatic stent was placed into the ventral pancreatic duct to try and aid with biliary cannulation and for PEP was placed. - A biliary precut sphincterotomy was performed to try and gain biliary access. - As noted above, unsuccessful at cannulation   12/24> lap chole with IOC Stable post op with plan for IR biliary drain 12/26 IR unable to place drain "due to a combination of lack of any significant intrahepatic biliary ductal dilatation and patient's body habitus and poor sonographic window."  Subjective: Continue to have RUQ abdominal pain and some LLQ pain.     Assessment & Plan:   Principal Problem:   Choledocholithiasis with acute cholecystitis- elevated LFTs - see above for details- general surgery signed off on 12/26 - GI yesterday recommended to try to evacuate bowels- Dulcolax suppository was ineffective- give soap suds enema today - LFTs somewhat better but as pain not improved GI has ordered an MRCP - cont oral and IV pain meds        Time spent in minutes: 35 DVT prophylaxis: SCDs Start: 03/12/21 0906 Place and maintain sequential compression device Start: 03/11/21 0609   Code Status: full code Family Communication:   Level of Care: Level of care: Med-Surg Disposition Plan:  Status is: Inpatient  Remains inpatient appropriate because: acute pain, etiology undetermined, IV pain meds      Consultants:  GI General surgery has signed off  Antimicrobials:  Anti-infectives (From admission, onward)    Start     Dose/Rate Route Frequency Ordered Stop   03/13/21 0809  piperacillin-tazobactam (ZOSYN) 3.375 GM/50ML IVPB       Note to Pharmacy: Christell Faith L: cabinet override      03/13/21 0809 03/13/21 0849   03/12/21 1245  ceFAZolin (ANCEF) IVPB 2g/100 mL premix  Status:  Discontinued        2 g 200 mL/hr over 30 Minutes Intravenous On call to O.R. 03/12/21 1218 03/12/21 1838   03/11/21 0000  piperacillin-tazobactam (ZOSYN) IVPB 3.375 g        3.375 g 12.5 mL/hr over 240 Minutes Intravenous Every 8 hours 03/10/21 2336     03/10/21 1345  piperacillin-tazobactam (ZOSYN) IVPB 3.375 g        3.375 g 100 mL/hr over 30 Minutes Intravenous  Once 03/10/21 1330 03/10/21 1635        Objective: Vitals:   03/16/21 1327 03/16/21 2015 03/17/21 0435 03/17/21 0600  BP: (!) 181/93 (!) 167/116 (!) 177/99 (!) 187/117  Pulse: 73 100 100 96  Resp: 16 20 20    Temp: 97.7 F (36.5 C) 97.7 F (36.5 C) 98.3 F (36.8 C)   TempSrc: Oral Oral Oral   SpO2: 94% 94% 91% 91%  Weight:      Height:  No intake or output data in the 24 hours ending 03/17/21 1519 Filed Weights   03/10/21 0949  Weight: 115.7 kg    Examination: General exam: Appears uncomfortable  HEENT: PERRLA, oral mucosa moist, no sclera icterus or thrush Respiratory system: Clear to auscultation. Respiratory effort normal. Cardiovascular system: S1 & S2 heard, RRR.   Gastrointestinal system: Abdomen soft, RUQ tenderness noted, nondistended. Normal bowel sounds. Central nervous system: Alert and oriented. No focal neurological deficits. Extremities: No cyanosis, clubbing or edema Skin: No rashes or ulcers Psychiatry:  Mood & affect  appropriate.     Data Reviewed: I have personally reviewed following labs and imaging studies  CBC: Recent Labs  Lab 03/11/21 0554 03/12/21 0540 03/15/21 0455 03/16/21 0448 03/17/21 0518  WBC 6.3 5.9 6.3 9.0 10.9*  NEUTROABS 3.2  --   --  6.7 8.6*  HGB 14.4 13.5 13.1 13.1 13.3  HCT 42.8 39.8 40.1 40.1 38.8  MCV 89.9 90.9 92.8 91.8 90.0  PLT 166 152 149* 148* 952*   Basic Metabolic Panel: Recent Labs  Lab 03/13/21 0518 03/14/21 0600 03/15/21 0455 03/16/21 0448 03/17/21 0518  NA 138 137 140 135 132*  K 4.0 3.7 3.8 3.9 3.8  CL 104 109 107 102 98  CO2 25 24 23 26 26   GLUCOSE 109* 103* 97 110* 122*  BUN 8 12 8 8 7   CREATININE 0.58 0.78 0.81 0.59 0.57  CALCIUM 9.4 8.4* 8.6* 8.8* 9.2  MG  --   --   --  1.9 1.9  PHOS  --   --   --  4.0 3.5   GFR: Estimated Creatinine Clearance: 103.6 mL/min (by C-G formula based on SCr of 0.57 mg/dL). Liver Function Tests: Recent Labs  Lab 03/13/21 0518 03/14/21 0600 03/15/21 0455 03/16/21 0448 03/17/21 0518  AST 272* 391* 376* 208* 135*  ALT 518* 601* 611* 532* 385*  ALKPHOS 248* 268* 276* 270* 298*  BILITOT 2.7* 2.1* 1.5* 1.4* 1.9*  PROT 7.4 7.2 6.5 7.0 6.9  ALBUMIN 3.9 3.9 3.5 3.7 3.6   Recent Labs  Lab 03/12/21 0540  LIPASE 25   No results for input(s): AMMONIA in the last 168 hours. Coagulation Profile: Recent Labs  Lab 03/15/21 0455  INR 0.9   Cardiac Enzymes: No results for input(s): CKTOTAL, CKMB, CKMBINDEX, TROPONINI in the last 168 hours. BNP (last 3 results) No results for input(s): PROBNP in the last 8760 hours. HbA1C: No results for input(s): HGBA1C in the last 72 hours. CBG: No results for input(s): GLUCAP in the last 168 hours. Lipid Profile: No results for input(s): CHOL, HDL, LDLCALC, TRIG, CHOLHDL, LDLDIRECT in the last 72 hours. Thyroid Function Tests: No results for input(s): TSH, T4TOTAL, FREET4, T3FREE, THYROIDAB in the last 72 hours. Anemia Panel: No results for input(s): VITAMINB12,  FOLATE, FERRITIN, TIBC, IRON, RETICCTPCT in the last 72 hours. Urine analysis:    Component Value Date/Time   COLORURINE AMBER (A) 03/10/2021 1006   APPEARANCEUR CLEAR 03/10/2021 1006   LABSPEC >=1.030 03/10/2021 1006   PHURINE 6.0 03/10/2021 1006   GLUCOSEU 100 (A) 03/10/2021 1006   HGBUR NEGATIVE 03/10/2021 1006   BILIRUBINUR LARGE (A) 03/10/2021 1006   KETONESUR NEGATIVE 03/10/2021 1006   PROTEINUR 30 (A) 03/10/2021 1006   UROBILINOGEN 0.2 09/12/2009 0256   NITRITE NEGATIVE 03/10/2021 1006   LEUKOCYTESUR NEGATIVE 03/10/2021 1006   Sepsis Labs: @LABRCNTIP (procalcitonin:4,lacticidven:4) ) Recent Results (from the past 240 hour(s))  Resp Panel by RT-PCR (Flu A&B, Covid) Nasopharyngeal Swab  Status: None   Collection Time: 03/10/21 10:06 AM   Specimen: Nasopharyngeal Swab; Nasopharyngeal(NP) swabs in vial transport medium  Result Value Ref Range Status   SARS Coronavirus 2 by RT PCR NEGATIVE NEGATIVE Final    Comment: (NOTE) SARS-CoV-2 target nucleic acids are NOT DETECTED.  The SARS-CoV-2 RNA is generally detectable in upper respiratory specimens during the acute phase of infection. The lowest concentration of SARS-CoV-2 viral copies this assay can detect is 138 copies/mL. A negative result does not preclude SARS-Cov-2 infection and should not be used as the sole basis for treatment or other patient management decisions. A negative result may occur with  improper specimen collection/handling, submission of specimen other than nasopharyngeal swab, presence of viral mutation(s) within the areas targeted by this assay, and inadequate number of viral copies(<138 copies/mL). A negative result must be combined with clinical observations, patient history, and epidemiological information. The expected result is Negative.  Fact Sheet for Patients:  EntrepreneurPulse.com.au  Fact Sheet for Healthcare Providers:  IncredibleEmployment.be  This  test is no t yet approved or cleared by the [place] FDA and  has been authorized for detection and/or diagnosis of SARS-CoV-2 by FDA under an Emergency Use Authorization (EUA). This EUA will remain  in effect (meaning this test can be used) for the duration of the COVID-19 declaration under Section 564(b)(1) of the Act, 21 U.S.C.section 360bbb-3(b)(1), unless the authorization is terminated  or revoked sooner.       Influenza A by PCR NEGATIVE NEGATIVE Final   Influenza B by PCR NEGATIVE NEGATIVE Final    Comment: (NOTE) The Xpert Xpress SARS-CoV-2/FLU/RSV plus assay is intended as an aid in the diagnosis of influenza from Nasopharyngeal swab specimens and should not be used as a sole basis for treatment. Nasal washings and aspirates are unacceptable for Xpert Xpress SARS-CoV-2/FLU/RSV testing.  Fact Sheet for Patients: EntrepreneurPulse.com.au  Fact Sheet for Healthcare Providers: IncredibleEmployment.be  This test is not yet approved or cleared by the [place] FDA and has been authorized for detection and/or diagnosis of SARS-CoV-2 by FDA under an Emergency Use Authorization (EUA). This EUA will remain in effect (meaning this test can be used) for the duration of the COVID-19 declaration under Section 564(b)(1) of the Act, 21 U.S.C. section 360bbb-3(b)(1), unless the authorization is terminated or revoked.  Performed at [facility], [address] [place], [place] [address]   Culture, blood (routine x 2)     Status: None   Collection Time: 03/10/21  1:49 PM   Specimen: BLOOD  Result Value Ref Range Status   Specimen Description BLOOD LEFT ANTECUBITAL  Final   Special Requests   Final    BOTTLES DRAWN AEROBIC ONLY Blood Culture adequate volume   Culture   Final    NO GROWTH 5 DAYS Performed at [facility], [place] [address] [place], [place] [address]    Report Status 03/15/2021 FINAL  Final  Culture,  blood (routine x 2)     Status: None   Collection Time: 03/10/21  2:00 PM   Specimen: BLOOD  Result Value Ref Range Status   Specimen Description   Final    BLOOD Blood Culture adequate volume Performed at [facility], [address] [place], [place] [address]    Special Requests   Final    BOTTLES DRAWN AEROBIC AND ANAEROBIC BLOOD LEFT HAND Performed at [facility], [address], [place], [place] [address]  Culture   Final    NO GROWTH 5 DAYS Performed at [facility], [place] [address] [place], [place] [address]    Report Status 03/15/2021 FINAL  Final         Radiology Studies: IR PERCUTANEOUS TRANSHEPATIC CHOLANGIOGRAM  Result Date: 03/15/2021 INDICATION: History of choledocholithiasis, post attempted though ultimately unsuccessful ERCP. Patient subsequent underwent laparoscopic cholecystectomy with intraoperative cholangiogram demonstrating an occlusive stone within the distal aspect of the CBD. As such patient presents for image guided placement an internal/external biliary drainage catheter Note, the patient's LFTs have normalized and she has no significant intrahepatic biliary ductal dilatation on MRCP 03/11/2021 however Dr. Rush Landmark would like [place] to proceed with attempted internal/external biliary drainage catheter placement. EXAM: ULTRASOUND AND FLUOROSCOPIC GUIDED TRANSHEPATIC CHOLANGIOGRAM COMPARISON:  MRCP-03/11/2021 MEDICATIONS: Patient is currently admitted to the hospital receiving intravenous antibiotics; The antibiotic was administered with an appropriate time frame prior to the initiation of the procedure CONTRAST:  15 cc Isovue-300 - administered into the biliary tree. ANESTHESIA/SEDATION: Moderate (conscious) sedation was employed during this procedure. A total of Versed 8 mg, fentanyl 300 mcg and Dilaudid 1 mg was administered intravenously. Moderate Sedation Time: 70 minutes. The patient's level of consciousness and vital signs were  monitored continuously by radiology nursing throughout the procedure under my direct supervision. FLUOROSCOPY TIME:  18.4 minutes (0160 mGy) COMPLICATIONS: None immediate. TECHNIQUE: Informed written consent was obtained from the patient after a discussion of the risks, benefits and alternatives to treatment. Questions regarding the procedure were encouraged and answered. A timeout was performed prior to the initiation of the procedure. Sonographic evaluation performed redemonstrated no significant intrahepatic biliary ductal dilatation. Additionally, there is poor sonographic window of both the left and right lobe of the liver secondary to patient body habitus. The right upper abdominal quadrant and midline of the upper abdomen was prepped and draped in the usual sterile fashion, and a sterile drape was applied covering the operative field. Maximum barrier sterile technique with sterile gowns and gloves were used for the procedure. A timeout was performed prior to the initiation of the procedure. Under direct ultrasound guidance, efforts were made to cannulate a nondilated duct within left lobe liver however this ultimately proved unsuccessful. Next, under ultrasound guidance, after which were made to cannulate a nondilated duct within the peripheral aspect the right lobe liver Contrast injection was able to opacify a peripheral nondilated portion of the right intrahepatic biliary tree. Multiple spot fluoroscopic and radiographic images were obtained in various obliquities demonstrating patency of the CBD with passage of contrast to the level duodenum and without a discrete residual filling defect to suggest residual choledocholithiasis. Prolonged efforts were made to cannulate the opacified right bile duct however this ultimately proved unsuccessful. The opacified bile duct was then targeted fluoroscopically however again, the duct could not be cannulated due to lack of dilatation as well as patient body habitus.  At this time, the decision was made to cc further attempts at placement of an internal/external biliary drainage catheter. The procedure was terminated. Dressings were applied. The patient tolerated the procedure well without immediate postprocedural complication though was expectedly uncomfortable at the time of procedure completion. FINDINGS: Sonographic evaluation demonstrates no significant intrahepatic biliary ductal dilatation with poor sonographic window of both the right and left lobes liver secondary to patient body habitus and poor sonographic window. Ultimately, under a combination of ultrasound and fluoroscopic guidance, the peripheral aspect of a nondilated right intrahepatic biliary duct was opacified with intraoperative cholangiogram demonstrating  restored patency of the CBD with passage of contrast to the level duodenum and without a definitive residual filling defect to suggest residual choledocholithiasis. Despite prolonged efforts, the opacified biliary tree could not be successfully cannulated secondary to lack of intrahepatic biliary ductal dilatation. Redemonstrated pancreatic stent. IMPRESSION: 1. Attempted though ultimately unsuccessful placement of an internal/external biliary drainage catheter secondary to lack of intrahepatic biliary duct dilatation and patient body habitus. 2. Intra procedural cholangiogram demonstrates restored patency of the CBD with passage of contrast to the level duodenum and without a discrete residual filling defect to suggest residual choledocholithiasis. Electronically Signed   By: Sandi Mariscal M.D.   On: 03/15/2021 17:48      Scheduled Meds:  diclofenac Sodium  2 g Topical QID   indomethacin  100 mg Rectal Once   pantoprazole  20 mg Oral Daily   polyethylene glycol  17 g Oral BID   senna  2 tablet Oral QHS   Continuous Infusions:  sodium chloride Stopped (03/14/21 1624)   lactated ringers 10 mL/hr at 03/13/21 0827   piperacillin-tazobactam (ZOSYN)   IV 3.375 g (03/17/21 0731)   promethazine (PHENERGAN) injection (IM or IVPB) 25 mg (03/17/21 1422)     LOS: 7 days      Debbe Odea, MD [facility] Pager: [email or web address] 03/17/2021, 3:19 PM

## 2021-03-17 NOTE — TOC Progression Note (Signed)
Transition of Care [facility]) - Progression Note    Patient Details  Name: DELAYNIE STETZER MRN: [id number] Date of Birth: 1970-05-06  Transition of Care [facility]) CM/SW Contact  Leeroy Cha, RN Phone Number: 03/17/2021, 8:02 AM  Clinical Narrative:     Transition of Care [facility]) Screening Note   Patient Details  Name: ANNICE JOLLY Date of Birth: August 26, 1970   [facility] of Care [facility]) CM/SW Contact:    Leeroy Cha, RN Phone Number: 03/17/2021, 8:02 AM    Transition of Care Department [facility]) has reviewed patient and no TOC needs have been identified at this time. We will continue to monitor patient advancement through interdisciplinary progression rounds. If new patient transition needs arise, please place a TOC consult.       Barriers to Discharge: Continued Medical Work up  Expected Discharge Plan and Services                                                 Social Determinants of Health (SDOH) Interventions    Readmission Risk Interventions No flowsheet data found.

## 2021-03-17 NOTE — Progress Notes (Signed)
Called [facility] to facilitate patient transfer for ERCP given she had failed ERCP here and also failed percutaneous biliary catheter by IR  She is s/p cholecystectomy  MRCP suggestive of intra and extra hepatic duct dilation , distal CBD stones and possible bile leak  Ordered HIDA scan to further evaluate the bile leak May need to percutaneous drain for peri hepatic fluid collection, will defer to surgery for management  Spoke to [facility] center RN, who refused to connect me with [facility] or advanced endoscopist to discuss the case unless we fax patient information face sheet to [phone] and upload all images to [employer]. She said they have very limited availability for ERCP and didn't want to page them without patient face sheet??  We will check tomorrow am if any of our advanced endoscopist will be able to re attempt ERCP and then re contact [facility] or other tertiary centers if needed  NPO Continue Zosyn  Damaris Hippo , MD [phone]

## 2021-03-17 NOTE — Progress Notes (Addendum)
MRCP shows a moderate amount of perihepatic fluid which is new. A small peripherally enhancing collection in the right hepatic lobe measures 1.3 cm , not present previously and likely related to attempted percutaneous biliary drainage catheter placement. There is mild intra and extrahepatic biliary dilatation with the common hepatic duct measuring up to 12 mm in diameter. There are 2 probable dependent punctate stones in the distal common bile duct   Discussed case with one of our biliary endoscopists, Dr .Rush Landmark who attempted recent ERCP. He has no availability for ERCP within the next couple of days. He recommends transfer to tertiary care center as soon as possible for ERCP for removal of bile duct stones. He recommends HIDA in the interim.  I will notify General Surgery ( had lap chole 12/24). Will order HIDA scan. Continue Zosyn for now. TRH updated.

## 2021-03-18 ENCOUNTER — Encounter (HOSPITAL_COMMUNITY): Payer: Self-pay | Admitting: Certified Registered Nurse Anesthetist

## 2021-03-18 ENCOUNTER — Inpatient Hospital Stay (HOSPITAL_COMMUNITY): Payer: BC Managed Care – PPO

## 2021-03-18 ENCOUNTER — Telehealth: Payer: Self-pay | Admitting: Gastroenterology

## 2021-03-18 DIAGNOSIS — R933 Abnormal findings on diagnostic imaging of other parts of digestive tract: Secondary | ICD-10-CM | POA: Diagnosis not present

## 2021-03-18 DIAGNOSIS — K819 Cholecystitis, unspecified: Secondary | ICD-10-CM | POA: Diagnosis not present

## 2021-03-18 DIAGNOSIS — K8042 Calculus of bile duct with acute cholecystitis without obstruction: Secondary | ICD-10-CM | POA: Diagnosis not present

## 2021-03-18 DIAGNOSIS — K839 Disease of biliary tract, unspecified: Secondary | ICD-10-CM | POA: Diagnosis not present

## 2021-03-18 LAB — HEPATIC FUNCTION PANEL
ALT: 287 U/L — ABNORMAL HIGH (ref 0–44)
AST: 87 U/L — ABNORMAL HIGH (ref 15–41)
Albumin: 3.5 g/dL (ref 3.5–5.0)
Alkaline Phosphatase: 286 U/L — ABNORMAL HIGH (ref 38–126)
Bilirubin, Direct: 0.9 mg/dL — ABNORMAL HIGH (ref 0.0–0.2)
Indirect Bilirubin: 1.2 mg/dL — ABNORMAL HIGH (ref 0.3–0.9)
Total Bilirubin: 2.1 mg/dL — ABNORMAL HIGH (ref 0.3–1.2)
Total Protein: 6.9 g/dL (ref 6.5–8.1)

## 2021-03-18 IMAGING — NM NM HEPATOBILIARY SCAN
2 series · 12 of 12 positions shown · non-contrast
Comparison: MRI [DATE]

CLINICAL DATA: Evaluate for bowel leak. Status post
cholecystectomy.

EXAM:
NUCLEAR MEDICINE HEPATOBILIARY IMAGING
TECHNIQUE: Sequential images of the abdomen were obtained [DATE] minutes
following intravenous administration of radiopharmaceutical.
RADIOPHARMACEUTICALS:  7.5 mCi [HV]  Choletec IV

[Series 1: bile leak 2 hr · 3.28mm/px · 6 of 8 frames shown]
[frame 1/8]
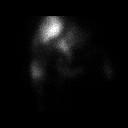
[frame 2/8]
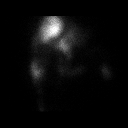
[frame 4/8]
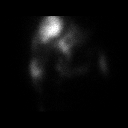
[frame 5/8]
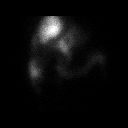
[frame 6/8]
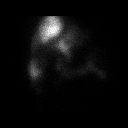
[frame 8/8]
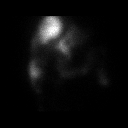

[Series 1: bile leak 1hr · 3.28mm/px · 6 of 60 frames shown]
[frame 6/60]
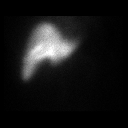
[frame 16/60]
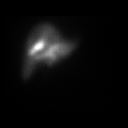
[frame 26/60]
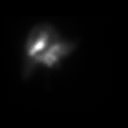
[frame 36/60]
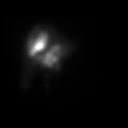
[frame 46/60]
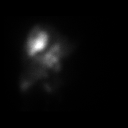
[frame 56/60]
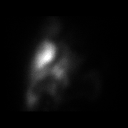

[12 of 12 positions shown; findings below may reference images not displayed]

FINDINGS: Prompt uptake and biliary excretion of activity by the liver is
seen. During the first hour there is progressive abnormal
radiotracer extravasation over the right hepatic lobe and extending
along the inferior and medial margin of the right hepatic lobe.
Radiotracer activity extends into the porta hepatic region as well
as the right pericolic gutter.

Some radiotracer accumulation is identified within the common bile
duct and small bowel loops.
IMPRESSION: Examination is positive for abnormal radiotracer accumulation over
the right hepatic lobe and extending along the medial and inferior
margin of the liver into the right pericolic gutter. Imaging
findings are compatible with active bile leak.

## 2021-03-18 MED ORDER — TECHNETIUM TC 99M MEBROFENIN IV KIT
7.5000 | PACK | Freq: Once | INTRAVENOUS | Status: AC | PRN
  Administered 2021-03-18: 11:00:00 7.5 via INTRAVENOUS

## 2021-03-18 NOTE — Telephone Encounter (Signed)
Got the msg. thanks

## 2021-03-18 NOTE — Telephone Encounter (Signed)
[facility] called.  Dr. Clovis Riley, Radiologist, would like to speak with you regarding this patient about some findings on a Nuclear Medicine scan done.  Please call Dr. Clovis Riley at [phone].  Thank you.

## 2021-03-18 NOTE — Progress Notes (Addendum)
We have reached out to [facility] about inpatient transfer, awaiting a call back. I called inpatient transfer department at [facility] and also [facility]. Neither are accepting medicine transfers and GI doesn't admit.   Addendum:  HIDA is positive for bile leak. Scheduled for ERCP at 730 am tomorrow with Dr. Fuller Plan. I spoke with inpatient transfer at [facility] in [place] and they are closed for transfers. Still waiting to hear back from [facility].

## 2021-03-18 NOTE — Progress Notes (Signed)
PROGRESS NOTE    Lynn Simpson   KDX:[id number]  DOB: 1970/10/24  DOA: 03/10/2021 PCP: Haywood Pao, MD   Brief Narrative:  AVEEN STANSEL is 50 y/o with Lynch syndrome, history of prophylactic bilateral mastectomy due to family history of breast cancer who presented on 12/21 for vomiting and abdominal pain and found to have choledocholithiasis and acute cholecystitis.  ERCP 03/12/2021: - J-shaped deformity of the stomach. - The major papilla was adjacent to a diverticulum. - The major papilla appeared congested and had a hood and excess tissue in place. - Only standard ERCP scope position could be used to evaluate the ampullary region (semilong/ long position did not allow for any visualization of the papilla). - One temporary plastic pancreatic stent was placed into the ventral pancreatic duct to try and aid with biliary cannulation and for PEP was placed. - A biliary precut sphincterotomy was performed to try and gain biliary access. - As noted above, unsuccessful at cannulation   12/24> lap chole with IOC Stable post op with plan for IR biliary drain 12/26 IR unable to place drain "due to a combination of lack of any significant intrahepatic biliary ductal dilatation and patient's body habitus and poor sonographic window."  Subjective: Continue to have RUQ abdominal pain and some LLQ pain.     Assessment & Plan:   Principal Problem:   Choledocholithiasis with acute cholecystitis- elevated LFTs - see above for details- general surgery signed off on 12/26 after lap chole - GI yesterday recommended to try to evacuate bowels 12/27- Dulcolax suppository was ineffective- give soap suds enema - still not much stool- will not give further laxatives - LFTs somewhat better but as pain not improved GI has ordered an MRCP which suggested a bile leak- HIDA scan ordered today confirms a bile leak - plan for ERCP again tomorrow - cont oral and IV pain meds - Cont Zosyn         Time spent in minutes: 35 DVT prophylaxis: SCDs Start: 03/12/21 0906 Place and maintain sequential compression device Start: 03/11/21 0609   Code Status: full code Family Communication:  Level of Care: Level of care: Med-Surg Disposition Plan:  Status is: Inpatient  Remains inpatient appropriate because: acute pain, etiology undetermined, IV pain meds      Consultants:  GI General surgery has signed off  Antimicrobials:  Anti-infectives (From admission, onward)    Start     Dose/Rate Route Frequency Ordered Stop   03/17/21 1715  cefTRIAXone (ROCEPHIN) 2 g in sodium chloride 0.9 % 100 mL IVPB  Status:  Discontinued       Note to Pharmacy: This will be given in IR on 12/26 for internal/external biliary drain placement planned for 0900   2 g 200 mL/hr over 30 Minutes Intravenous  Once 03/17/21 1616 03/17/21 1840   03/13/21 0809  piperacillin-tazobactam (ZOSYN) 3.375 GM/50ML IVPB       Note to Pharmacy: Christell Faith L: cabinet override      03/13/21 0809 03/13/21 0849   03/12/21 1245  ceFAZolin (ANCEF) IVPB 2g/100 mL premix  Status:  Discontinued        2 g 200 mL/hr over 30 Minutes Intravenous On call to O.R. 03/12/21 1218 03/12/21 1838   03/11/21 0000  piperacillin-tazobactam (ZOSYN) IVPB 3.375 g        3.375 g 12.5 mL/hr over 240 Minutes Intravenous Every 8 hours 03/10/21 2336     03/10/21 1345  piperacillin-tazobactam (ZOSYN) IVPB 3.375 g  3.375 g 100 mL/hr over 30 Minutes Intravenous  Once 03/10/21 1330 03/10/21 1635        Objective: Vitals:   03/18/21 0453 03/18/21 0928 03/18/21 1337 03/18/21 1338  BP: (!) 173/97 132/76 (!) 142/90   Pulse: (!) 109  (!) 109   Resp: 18  18   Temp: 98.7 F (37.1 C)  98.6 F (37 C)   TempSrc: Oral  Oral   SpO2: 92%  (!) 89% 93%  Weight:      Height:        Intake/Output Summary (Last 24 hours) at 03/18/2021 1421 Last data filed at 03/18/2021 9767 Gross per 24 hour  Intake 1655.02 ml  Output 950 ml  Net  705.02 ml   Filed Weights   03/10/21 0949  Weight: 115.7 kg    Examination: General exam: Appears comfortable  HEENT: PERRLA, oral mucosa moist, no sclera icterus or thrush Respiratory system: Clear to auscultation. Respiratory effort normal. Cardiovascular system: S1 & S2 heard, regular rate and rhythm Gastrointestinal system: Abdomen soft, RUQ tenderness, nondistended. Normal bowel sounds   Central nervous system: Alert and oriented. No focal neurological deficits. Extremities: No cyanosis, clubbing or edema Skin: No rashes or ulcers Psychiatry:  Mood & affect appropriate.       Data Reviewed: I have personally reviewed following labs and imaging studies  CBC: Recent Labs  Lab 03/12/21 0540 03/15/21 0455 03/16/21 0448 03/17/21 0518  WBC 5.9 6.3 9.0 10.9*  NEUTROABS  --   --  6.7 8.6*  HGB 13.5 13.1 13.1 13.3  HCT 39.8 40.1 40.1 38.8  MCV 90.9 92.8 91.8 90.0  PLT 152 149* 148* 144*    Basic Metabolic Panel: Recent Labs  Lab 03/14/21 0600 03/15/21 0455 03/16/21 0448 03/17/21 0518 03/17/21 1627  NA 137 140 135 132* 133*  K 3.7 3.8 3.9 3.8 3.8  CL 109 107 102 98 96*  CO2 24 23 26 26 29   GLUCOSE 103* 97 110* 122* 107*  BUN 12 8 8 7 6   CREATININE 0.78 0.81 0.59 0.57 0.46  CALCIUM 8.4* 8.6* 8.8* 9.2 9.3  MG  --   --  1.9 1.9  --   PHOS  --   --  4.0 3.5  --     GFR: Estimated Creatinine Clearance: 103.6 mL/min (by C-G formula based on SCr of 0.46 mg/dL). Liver Function Tests: Recent Labs  Lab 03/15/21 0455 03/16/21 0448 03/17/21 0518 03/17/21 1627 03/18/21 0519  AST 376* 208* 135* 133* 87*  ALT 611* 532* 385* 379* 287*  ALKPHOS 276* 270* 298* 338* 286*  BILITOT 1.5* 1.4* 1.9* 2.2* 2.1*  PROT 6.5 7.0 6.9 7.5 6.9  ALBUMIN 3.5 3.7 3.6 3.9 3.5    Recent Labs  Lab 03/12/21 0540  LIPASE 25    No results for input(s): AMMONIA in the last 168 hours. Coagulation Profile: Recent Labs  Lab 03/15/21 0455  INR 0.9    Cardiac Enzymes: No results  for input(s): CKTOTAL, CKMB, CKMBINDEX, TROPONINI in the last 168 hours. BNP (last 3 results) No results for input(s): PROBNP in the last 8760 hours. HbA1C: No results for input(s): HGBA1C in the last 72 hours. CBG: No results for input(s): GLUCAP in the last 168 hours. Lipid Profile: No results for input(s): CHOL, HDL, LDLCALC, TRIG, CHOLHDL, LDLDIRECT in the last 72 hours. Thyroid Function Tests: No results for input(s): TSH, T4TOTAL, FREET4, T3FREE, THYROIDAB in the last 72 hours. Anemia Panel: No results for input(s): VITAMINB12, FOLATE, FERRITIN, TIBC, IRON,  RETICCTPCT in the last 72 hours. Urine analysis:    Component Value Date/Time   COLORURINE AMBER (A) 03/10/2021 1006   APPEARANCEUR CLEAR 03/10/2021 1006   LABSPEC >=1.030 03/10/2021 1006   PHURINE 6.0 03/10/2021 1006   GLUCOSEU 100 (A) 03/10/2021 1006   HGBUR NEGATIVE 03/10/2021 1006   BILIRUBINUR LARGE (A) 03/10/2021 1006   KETONESUR NEGATIVE 03/10/2021 1006   PROTEINUR 30 (A) 03/10/2021 1006   UROBILINOGEN 0.2 09/12/2009 0256   NITRITE NEGATIVE 03/10/2021 1006   LEUKOCYTESUR NEGATIVE 03/10/2021 1006   Sepsis Labs: @LABRCNTIP (procalcitonin:4,lacticidven:4) ) Recent Results (from the past 240 hour(s))  Resp Panel by RT-PCR (Flu A&B, Covid) Nasopharyngeal Swab     Status: None   Collection Time: 03/10/21 10:06 AM   Specimen: Nasopharyngeal Swab; Nasopharyngeal(NP) swabs in vial transport medium  Result Value Ref Range Status   SARS Coronavirus 2 by RT PCR NEGATIVE NEGATIVE Final    Comment: (NOTE) SARS-CoV-2 target nucleic acids are NOT DETECTED.  The SARS-CoV-2 RNA is generally detectable in upper respiratory specimens during the acute phase of infection. The lowest concentration of SARS-CoV-2 viral copies this assay can detect is 138 copies/mL. A negative result does not preclude SARS-Cov-2 infection and should not be used as the sole basis for treatment or other patient management decisions. A negative result  may occur with  improper specimen collection/handling, submission of specimen other than nasopharyngeal swab, presence of viral mutation(s) within the areas targeted by this assay, and inadequate number of viral copies(<138 copies/mL). A negative result must be combined with clinical observations, patient history, and epidemiological information. The expected result is Negative.  Fact Sheet for Patients:  EntrepreneurPulse.com.au  Fact Sheet for Healthcare Providers:  IncredibleEmployment.be  This test is no t yet approved or cleared by the [place] FDA and  has been authorized for detection and/or diagnosis of SARS-CoV-2 by FDA under an Emergency Use Authorization (EUA). This EUA will remain  in effect (meaning this test can be used) for the duration of the COVID-19 declaration under Section 564(b)(1) of the Act, 21 U.S.C.section 360bbb-3(b)(1), unless the authorization is terminated  or revoked sooner.       Influenza A by PCR NEGATIVE NEGATIVE Final   Influenza B by PCR NEGATIVE NEGATIVE Final    Comment: (NOTE) The Xpert Xpress SARS-CoV-2/FLU/RSV plus assay is intended as an aid in the diagnosis of influenza from Nasopharyngeal swab specimens and should not be used as a sole basis for treatment. Nasal washings and aspirates are unacceptable for Xpert Xpress SARS-CoV-2/FLU/RSV testing.  Fact Sheet for Patients: EntrepreneurPulse.com.au  Fact Sheet for Healthcare Providers: IncredibleEmployment.be  This test is not yet approved or cleared by the [place] FDA and has been authorized for detection and/or diagnosis of SARS-CoV-2 by FDA under an Emergency Use Authorization (EUA). This EUA will remain in effect (meaning this test can be used) for the duration of the COVID-19 declaration under Section 564(b)(1) of the Act, 21 U.S.C. section 360bbb-3(b)(1), unless the authorization is terminated  or revoked.  Performed at [facility], [address] [place], [place] [address]   Culture, blood (routine x 2)     Status: None   Collection Time: 03/10/21  1:49 PM   Specimen: BLOOD  Result Value Ref Range Status   Specimen Description BLOOD LEFT ANTECUBITAL  Final   Special Requests   Final    BOTTLES DRAWN AEROBIC ONLY Blood Culture adequate volume   Culture   Final    NO GROWTH 5 DAYS  Performed at [facility], [place] [address], [place], [place] [address]    Report Status 03/15/2021 FINAL  Final  Culture, blood (routine x 2)     Status: None   Collection Time: 03/10/21  2:00 PM   Specimen: BLOOD  Result Value Ref Range Status   Specimen Description   Final    BLOOD Blood Culture adequate volume Performed at [facility], [address] [place], [place] [address]    Special Requests   Final    BOTTLES DRAWN AEROBIC AND ANAEROBIC BLOOD LEFT HAND Performed at [facility], [address] [place], [place] [address]    Culture   Final    NO GROWTH 5 DAYS Performed at [facility], [place] [address] [place], [place] [address]    Report Status 03/15/2021 FINAL  Final         Radiology Studies: MR 3D Recon At Scanner  Result Date: 03/17/2021 CLINICAL DATA:  Abdominal pain with elevated liver function studies status post recent cholecystectomy and ERCP. Biliary obstruction suspected. Choledocholithiasis on previous intraoperative cholangiogram with unsuccessful internal/external biliary drainage catheter placement 2 days prior. EXAM: MRI ABDOMEN WITHOUT AND WITH CONTRAST (INCLUDING MRCP) TECHNIQUE: Multiplanar multisequence MR imaging of the abdomen was performed both before and after the administration of intravenous contrast. Heavily T2-weighted images of the biliary and pancreatic ducts were obtained, and three-dimensional MRCP images were rendered by post processing. CONTRAST:  38mL GADAVIST GADOBUTROL 1 MMOL/ML IV SOLN  COMPARISON:  MRCP 03/11/2021, ERCP 03/12/2021 and intraoperative cholangiogram 03/13/2021. FINDINGS: Lower chest: Mild dependent atelectasis at both lung bases. No significant pleural effusion. Bilateral breast implants are noted. Hepatobiliary: Interval cholecystectomy without significant focal fluid collection in the cholecystectomy bed. There is a moderate amount of perihepatic fluid which is new. A small peripherally enhancing collection in the right hepatic lobe measures 1.3 cm on image 33/30, not present previously and likely related to attempted percutaneous biliary drainage catheter placement. There is mild intra and extrahepatic biliary dilatation with the common hepatic duct measuring up to 12 mm in diameter. There are 2 probable dependent punctate stones in the distal common bile duct, best seen on coronal image 20/10. Pancreas: Unremarkable. No evidence of pancreatic mass, pancreatic ductal dilatation or surrounding inflammation. Spleen: Normal in size without focal abnormality. Adrenals/Urinary Tract: Both adrenal glands appear normal. Stable small left renal cyst. No hydronephrosis. Stomach/Bowel: The stomach appears unremarkable for its degree of distension. No evidence of bowel wall thickening, distention or surrounding inflammatory change. Vascular/Lymphatic: There are no enlarged abdominal lymph nodes. No significant vascular findings. Other: As above, new ascites, primarily around the liver. Musculoskeletal: No acute or significant osseous findings. IMPRESSION: 1. Persistent mild biliary dilatation status post cholecystectomy with probable small punctate distal common bile duct stones. 2. New ascites, primarily around the liver. Although nonspecific, in the setting of recent cholecystectomy, this is suspicious for a bile leak. This could be further evaluated with nuclear medicine hepatobiliary scan. 3. New small collection centrally in the right hepatic lobe attributed to recent attempted  percutaneous biliary drainage catheter placement. 4. No evidence of pancreatitis or pancreatic ductal dilatation. Electronically Signed   By: Richardean Sale M.D.   On: 03/17/2021 15:56   MR ABDOMEN MRCP W WO CONTAST  Result Date: 03/17/2021 CLINICAL DATA:  Abdominal pain with elevated liver function studies status post recent cholecystectomy and ERCP. Biliary obstruction suspected. Choledocholithiasis on previous intraoperative cholangiogram with unsuccessful internal/external biliary drainage catheter placement  2 days prior. EXAM: MRI ABDOMEN WITHOUT AND WITH CONTRAST (INCLUDING MRCP) TECHNIQUE: Multiplanar multisequence MR imaging of the abdomen was performed both before and after the administration of intravenous contrast. Heavily T2-weighted images of the biliary and pancreatic ducts were obtained, and three-dimensional MRCP images were rendered by post processing. CONTRAST:  90mL GADAVIST GADOBUTROL 1 MMOL/ML IV SOLN COMPARISON:  MRCP 03/11/2021, ERCP 03/12/2021 and intraoperative cholangiogram 03/13/2021. FINDINGS: Lower chest: Mild dependent atelectasis at both lung bases. No significant pleural effusion. Bilateral breast implants are noted. Hepatobiliary: Interval cholecystectomy without significant focal fluid collection in the cholecystectomy bed. There is a moderate amount of perihepatic fluid which is new. A small peripherally enhancing collection in the right hepatic lobe measures 1.3 cm on image 33/30, not present previously and likely related to attempted percutaneous biliary drainage catheter placement. There is mild intra and extrahepatic biliary dilatation with the common hepatic duct measuring up to 12 mm in diameter. There are 2 probable dependent punctate stones in the distal common bile duct, best seen on coronal image 20/10. Pancreas: Unremarkable. No evidence of pancreatic mass, pancreatic ductal dilatation or surrounding inflammation. Spleen: Normal in size without focal abnormality.  Adrenals/Urinary Tract: Both adrenal glands appear normal. Stable small left renal cyst. No hydronephrosis. Stomach/Bowel: The stomach appears unremarkable for its degree of distension. No evidence of bowel wall thickening, distention or surrounding inflammatory change. Vascular/Lymphatic: There are no enlarged abdominal lymph nodes. No significant vascular findings. Other: As above, new ascites, primarily around the liver. Musculoskeletal: No acute or significant osseous findings. IMPRESSION: 1. Persistent mild biliary dilatation status post cholecystectomy with probable small punctate distal common bile duct stones. 2. New ascites, primarily around the liver. Although nonspecific, in the setting of recent cholecystectomy, this is suspicious for a bile leak. This could be further evaluated with nuclear medicine hepatobiliary scan. 3. New small collection centrally in the right hepatic lobe attributed to recent attempted percutaneous biliary drainage catheter placement. 4. No evidence of pancreatitis or pancreatic ductal dilatation. Electronically Signed   By: Richardean Sale M.D.   On: 03/17/2021 15:56   NM HEPATOBILIARY LEAK (POST-SURGICAL)  Result Date: 03/18/2021 CLINICAL DATA:  Evaluate for bowel leak. Status post cholecystectomy. EXAM: NUCLEAR MEDICINE HEPATOBILIARY IMAGING TECHNIQUE: Sequential images of the abdomen were obtained out to 60 minutes following intravenous administration of radiopharmaceutical. RADIOPHARMACEUTICALS:  7.5 mCi Tc-73m  Choletec IV COMPARISON:  MRI 03/17/21 FINDINGS: Prompt uptake and biliary excretion of activity by the liver is seen. During the first hour there is progressive abnormal radiotracer extravasation over the right hepatic lobe and extending along the inferior and medial margin of the right hepatic lobe. Radiotracer activity extends into the porta hepatic region as well as the right pericolic gutter. Some radiotracer accumulation is identified within the common bile  duct and small bowel loops. IMPRESSION: Examination is positive for abnormal radiotracer accumulation over the right hepatic lobe and extending along the medial and inferior margin of the liver into the right pericolic gutter. Imaging findings are compatible with active bile leak. Electronically Signed   By: Kerby Moors M.D.   On: 03/18/2021 13:25      Scheduled Meds:  diclofenac Sodium  2 g Topical QID   indomethacin  100 mg Rectal Once   pantoprazole  20 mg Oral Daily   polyethylene glycol  17 g Oral BID   senna  2 tablet Oral QHS   Continuous Infusions:  sodium chloride 75 mL/hr at 03/18/21 2355   lactated ringers 10 mL/hr at 03/13/21  0827   piperacillin-tazobactam (ZOSYN)  IV 3.375 g (03/18/21 0727)   promethazine (PHENERGAN) injection (IM or IVPB) 25 mg (03/17/21 1422)     LOS: 8 days      Debbe Odea, MD [facility] Pager: [email or web address] 03/18/2021, 2:21 PM

## 2021-03-18 NOTE — Discharge Summary (Addendum)
Physician Discharge Summary  AYRIANA WIX VZD:[id number] DOB: Jan 24, 1971 DOA: 03/10/2021  PCP: Haywood Pao, MD  Admit date: 03/10/2021 Discharge date: 03/18/2021      Discharge Diagnoses:  Principal Problem:   Choledocholithiasis with acute cholecystitis Active Problems:   Elevated LFTs   Cholecystitis   Bile leak   Abnormal magnetic resonance cholangiopancreatography (MRCP)  Lynch Syndrome    Brief Summary: IDOLINA MANTELL is 50 y/o with Lynch syndrome, history of prophylactic bilateral mastectomy due to family history of breast cancer who presented on 12/21 for vomiting and abdominal pain and found to have choledocholithiasis and acute cholecystitis.    Hospital Course:   Choledocholithiasis with acute cholecystitis- elevated LFTs ERCP 03/12/2021: - J-shaped deformity of the stomach. - The major papilla was adjacent to a diverticulum. - The major papilla appeared congested and had a hood and excess tissue in place. - Only standard ERCP scope position could be used to evaluate the ampullary region (semilong/ long position did not allow for any visualization of the papilla). - One temporary plastic pancreatic stent was placed into the ventral pancreatic duct to try and aid with biliary cannulation and for PEP was placed. - A biliary precut sphincterotomy was performed to try and gain biliary access. - As noted above, unsuccessful at cannulation   12/24> lap chole with IOC Stable post op with plan for IR biliary drain  12/26 IR unable to place transhepatic biliary drain "due to a combination of lack of any significant intrahepatic biliary ductal dilatation and patient's body habitus and poor sonographic window." Intra-operative cholangiogram demonstrates resorted patency of the CBD with passage of contrast to the level of the duodenum.  -  - general surgery signed off on 12/26  - GI yesterday recommended to try to evacuate bowels 12/27- Dulcolax suppository was  ineffective- give soap suds enema - still not much stool- will not give further laxatives - LFTs somewhat better but as pain not improved GI ordered an MRCP 12/28 which suggested a bile leak - HIDA scan ordered today confirms a bile leak - plan for ERCP again tomorrow if patient is not transferred to [facility]- she is on the waiting list for [facility]- Dr Rosalio Macadamia (hospitalist) and Dr Jackquline Denmark (GI) have accepted her with tentative plans for ERCP tomorrow - cont oral and IV pain meds - Cont Zosyn and IVF    Discharge Exam: Vitals:   03/18/21 1337 03/18/21 1338  BP: (!) 142/90   Pulse: (!) 109   Resp: 18   Temp: 98.6 F (37 C)   SpO2: (!) 89% 93%   Vitals:   03/18/21 0453 03/18/21 0928 03/18/21 1337 03/18/21 1338  BP: (!) 173/97 132/76 (!) 142/90   Pulse: (!) 109  (!) 109   Resp: 18  18   Temp: 98.7 F (37.1 C)  98.6 F (37 C)   TempSrc: Oral  Oral   SpO2: 92%  (!) 89% 93%  Weight:      Height:        General: Pt is alert, awake, not in acute distress Cardiovascular: RRR, S1/S2 +, no rubs, no gallops Respiratory: CTA bilaterally, no wheezing, no rhonchi Abdominal: Soft, NT, ND, bowel sounds + Extremities: no edema, no cyanosis   Discharge Instructions  Discharge Instructions     No wound care   Complete by: As directed       Allergies as of 03/18/2021       Reactions   Bee Venom Anaphylaxis   Wellbutrin [  bupropion] Anaphylaxis        Medication List     STOP taking these medications    albuterol 108 (90 Base) MCG/ACT inhaler Commonly known as: VENTOLIN HFA   calcium carbonate 750 MG chewable tablet Commonly known as: TUMS EX   celecoxib 200 MG capsule Commonly known as: CELEBREX   hydrOXYzine 10 MG tablet Commonly known as: ATARAX       TAKE these medications    Clenpiq 10-3.5-12 MG-GM -GM/160ML Soln Generic drug: Sod Picosulfate-Mag Ox-Cit Acd Take as split dose prep following the doctor's instructions   famotidine 40 MG tablet Commonly  known as: PEPCID Take 1 tablet (40 mg total) by mouth daily.   hyoscyamine 0.125 MG SL tablet Commonly known as: LEVSIN SL Place 1 tablet (0.125 mg total) under the tongue every 6 (six) hours as needed.   ondansetron 8 MG disintegrating tablet Commonly known as: ZOFRAN-ODT Dissolve one tablet on the tongue every 8 hours as needed for nausea and vomiting. What changed:  how much to take how to take this when to take this reasons to take this additional instructions   promethazine 12.5 MG tablet Commonly known as: PHENERGAN Take 1 tablet (12.5 mg total) by mouth 2 (two) times daily as needed for nausea or vomiting.   VICKS DAYQUIL/NYQUIL COUGH PO Take 30 mLs by mouth every 4 (four) hours as needed (cough).        Follow-up Information     Sur[facility], [place]. Go on 04/06/2021.   Specialty: General Surgery Why: 9:15 AM. Please arrive 30 min prior to appointment time and have ID and insurance card with you. Contact information: [address] [place] [address] [phone]                Allergies  Allergen Reactions   Bee Venom Anaphylaxis   Wellbutrin [Bupropion] Anaphylaxis      DG Cholangiogram Operative  Result Date: 03/13/2021 CLINICAL DATA:  Acute calculus cholecystitis EXAM: INTRAOPERATIVE CHOLANGIOGRAM TECHNIQUE: Cholangiographic images from the C-arm fluoroscopic device were submitted for interpretation post-operatively. Please see the procedural report for the amount of contrast and the fluoroscopy time utilized. COMPARISON:  03/11/2021 FINDINGS: Intraoperative cholangiogram performed during laparoscopic procedure. There is diffuse biliary tree dilatation extending to the distal CBD. Small distal CBD filling defect suspicious for obstructing choledocholithiasis. Contrast does not drain easily into the duodenum. IMPRESSION: Distal CBD obstructing stone and biliary dilatation. Electronically Signed   By: Jerilynn Mages.  Shick M.D.   On: 03/13/2021  10:13   MR 3D Recon At Scanner  Result Date: 03/17/2021 CLINICAL DATA:  Abdominal pain with elevated liver function studies status post recent cholecystectomy and ERCP. Biliary obstruction suspected. Choledocholithiasis on previous intraoperative cholangiogram with unsuccessful internal/external biliary drainage catheter placement 2 days prior. EXAM: MRI ABDOMEN WITHOUT AND WITH CONTRAST (INCLUDING MRCP) TECHNIQUE: Multiplanar multisequence MR imaging of the abdomen was performed both before and after the administration of intravenous contrast. Heavily T2-weighted images of the biliary and pancreatic ducts were obtained, and three-dimensional MRCP images were rendered by post processing. CONTRAST:  60mL GADAVIST GADOBUTROL 1 MMOL/ML IV SOLN COMPARISON:  MRCP 03/11/2021, ERCP 03/12/2021 and intraoperative cholangiogram 03/13/2021. FINDINGS: Lower chest: Mild dependent atelectasis at both lung bases. No significant pleural effusion. Bilateral breast implants are noted. Hepatobiliary: Interval cholecystectomy without significant focal fluid collection in the cholecystectomy bed. There is a moderate amount of perihepatic fluid which is new. A small peripherally enhancing collection in the right hepatic lobe measures 1.3 cm on  image 33/30, not present previously and likely related to attempted percutaneous biliary drainage catheter placement. There is mild intra and extrahepatic biliary dilatation with the common hepatic duct measuring up to 12 mm in diameter. There are 2 probable dependent punctate stones in the distal common bile duct, best seen on coronal image 20/10. Pancreas: Unremarkable. No evidence of pancreatic mass, pancreatic ductal dilatation or surrounding inflammation. Spleen: Normal in size without focal abnormality. Adrenals/Urinary Tract: Both adrenal glands appear normal. Stable small left renal cyst. No hydronephrosis. Stomach/Bowel: The stomach appears unremarkable for its degree of distension.  No evidence of bowel wall thickening, distention or surrounding inflammatory change. Vascular/Lymphatic: There are no enlarged abdominal lymph nodes. No significant vascular findings. Other: As above, new ascites, primarily around the liver. Musculoskeletal: No acute or significant osseous findings. IMPRESSION: 1. Persistent mild biliary dilatation status post cholecystectomy with probable small punctate distal common bile duct stones. 2. New ascites, primarily around the liver. Although nonspecific, in the setting of recent cholecystectomy, this is suspicious for a bile leak. This could be further evaluated with nuclear medicine hepatobiliary scan. 3. New small collection centrally in the right hepatic lobe attributed to recent attempted percutaneous biliary drainage catheter placement. 4. No evidence of pancreatitis or pancreatic ductal dilatation. Electronically Signed   By: Richardean Sale M.D.   On: 03/17/2021 15:56   MR 3D Recon At Scanner  Result Date: 03/11/2021 CLINICAL DATA:  Choledocholithiasis, acute cholecystitis EXAM: MRI ABDOMEN WITHOUT AND WITH CONTRAST (INCLUDING MRCP) TECHNIQUE: Multiplanar multisequence MR imaging of the abdomen was performed both before and after the administration of intravenous contrast. Heavily T2-weighted images of the biliary and pancreatic ducts were obtained, and three-dimensional MRCP images were rendered by post processing. CONTRAST:  42mL GADAVIST GADOBUTROL 1 MMOL/ML IV SOLN COMPARISON:  Sonogram 03/10/2021 FINDINGS: Lower chest: Bilateral breast implants are visualized. Cardiac size within normal limits. Hepatobiliary: Normal hepatic parenchymal signal intensity. No focal intrahepatic masses are identified. No intrahepatic biliary ductal dilation. The extrahepatic bile duct is dilated measuring 11 mm in diameter. There are at least 3 intraluminal filling defects identified within the distal common duct in keeping with small stones measuring up to 3 mm in greatest  dimension. Multiple layering gallstones are seen within the gallbladder along with a small amount of sludge. The gallbladder is mildly distended. No pericholecystic inflammatory stranding or gallbladder hydrops is identified to suggest superimposed acute cholecystitis. Pancreas: No mass, inflammatory changes, or other parenchymal abnormality identified. No divisum Spleen:  Within normal limits in size and appearance. Adrenals/Urinary Tract: The adrenal glands are unremarkable. The kidneys are normal in size and position. No hydronephrosis. Simple cortical cyst noted within the mid to upper pole the left kidney. Stomach/Bowel: Visualized portions within the abdomen are unremarkable. Vascular/Lymphatic: No pathologically enlarged lymph nodes identified. No abdominal aortic aneurysm demonstrated. Other:  None. Musculoskeletal: No suspicious bone lesions identified. IMPRESSION: Cholelithiasis without superimposed inflammatory change to suggest acute cholecystitis. Choledocholithiasis with at least 3 small calculi measuring up to 3 mm in diameter within the distal common duct. Mild superimposed extrahepatic biliary ductal dilation. Electronically Signed   By: Fidela Salisbury M.D.   On: 03/11/2021 01:00   DG ERCP WITH SPHINCTEROTOMY  Result Date: 03/12/2021 CLINICAL DATA:  Choledocholithiasis with acute cholecystitis EXAM: ERCP TECHNIQUE: Multiple spot images obtained with the fluoroscopic device and submitted for interpretation post-procedure. FLUOROSCOPY TIME:  Fluoroscopy Time:  4 minutes 4 seconds Number of Acquired Spot Images: 0 COMPARISON:  MRCP 03/11/2021 FINDINGS: Four intraoperative saved images are submitted  for review demonstrating a flexible duodenal scope in the descending duodenum with wire cannulation of the common bile duct and placement of a biliary stent. IMPRESSION: ERCP as above. These images were submitted for radiologic interpretation only. Please see the procedural report for the amount of  contrast and the fluoroscopy time utilized. Electronically Signed   By: Jacqulynn Cadet M.D.   On: 03/12/2021 16:43   DG C-Arm 1-60 Min-No Report  Result Date: 03/12/2021 Fluoroscopy was utilized by the requesting physician.  No radiographic interpretation.   MR ABDOMEN MRCP W WO CONTAST  Result Date: 03/17/2021 CLINICAL DATA:  Abdominal pain with elevated liver function studies status post recent cholecystectomy and ERCP. Biliary obstruction suspected. Choledocholithiasis on previous intraoperative cholangiogram with unsuccessful internal/external biliary drainage catheter placement 2 days prior. EXAM: MRI ABDOMEN WITHOUT AND WITH CONTRAST (INCLUDING MRCP) TECHNIQUE: Multiplanar multisequence MR imaging of the abdomen was performed both before and after the administration of intravenous contrast. Heavily T2-weighted images of the biliary and pancreatic ducts were obtained, and three-dimensional MRCP images were rendered by post processing. CONTRAST:  90mL GADAVIST GADOBUTROL 1 MMOL/ML IV SOLN COMPARISON:  MRCP 03/11/2021, ERCP 03/12/2021 and intraoperative cholangiogram 03/13/2021. FINDINGS: Lower chest: Mild dependent atelectasis at both lung bases. No significant pleural effusion. Bilateral breast implants are noted. Hepatobiliary: Interval cholecystectomy without significant focal fluid collection in the cholecystectomy bed. There is a moderate amount of perihepatic fluid which is new. A small peripherally enhancing collection in the right hepatic lobe measures 1.3 cm on image 33/30, not present previously and likely related to attempted percutaneous biliary drainage catheter placement. There is mild intra and extrahepatic biliary dilatation with the common hepatic duct measuring up to 12 mm in diameter. There are 2 probable dependent punctate stones in the distal common bile duct, best seen on coronal image 20/10. Pancreas: Unremarkable. No evidence of pancreatic mass, pancreatic ductal dilatation  or surrounding inflammation. Spleen: Normal in size without focal abnormality. Adrenals/Urinary Tract: Both adrenal glands appear normal. Stable small left renal cyst. No hydronephrosis. Stomach/Bowel: The stomach appears unremarkable for its degree of distension. No evidence of bowel wall thickening, distention or surrounding inflammatory change. Vascular/Lymphatic: There are no enlarged abdominal lymph nodes. No significant vascular findings. Other: As above, new ascites, primarily around the liver. Musculoskeletal: No acute or significant osseous findings. IMPRESSION: 1. Persistent mild biliary dilatation status post cholecystectomy with probable small punctate distal common bile duct stones. 2. New ascites, primarily around the liver. Although nonspecific, in the setting of recent cholecystectomy, this is suspicious for a bile leak. This could be further evaluated with nuclear medicine hepatobiliary scan. 3. New small collection centrally in the right hepatic lobe attributed to recent attempted percutaneous biliary drainage catheter placement. 4. No evidence of pancreatitis or pancreatic ductal dilatation. Electronically Signed   By: Richardean Sale M.D.   On: 03/17/2021 15:56   MR ABDOMEN MRCP W WO CONTAST  Result Date: 03/11/2021 CLINICAL DATA:  Choledocholithiasis, acute cholecystitis EXAM: MRI ABDOMEN WITHOUT AND WITH CONTRAST (INCLUDING MRCP) TECHNIQUE: Multiplanar multisequence MR imaging of the abdomen was performed both before and after the administration of intravenous contrast. Heavily T2-weighted images of the biliary and pancreatic ducts were obtained, and three-dimensional MRCP images were rendered by post processing. CONTRAST:  76mL GADAVIST GADOBUTROL 1 MMOL/ML IV SOLN COMPARISON:  Sonogram 03/10/2021 FINDINGS: Lower chest: Bilateral breast implants are visualized. Cardiac size within normal limits. Hepatobiliary: Normal hepatic parenchymal signal intensity. No focal intrahepatic masses are  identified. No intrahepatic biliary ductal dilation. The extrahepatic  bile duct is dilated measuring 11 mm in diameter. There are at least 3 intraluminal filling defects identified within the distal common duct in keeping with small stones measuring up to 3 mm in greatest dimension. Multiple layering gallstones are seen within the gallbladder along with a small amount of sludge. The gallbladder is mildly distended. No pericholecystic inflammatory stranding or gallbladder hydrops is identified to suggest superimposed acute cholecystitis. Pancreas: No mass, inflammatory changes, or other parenchymal abnormality identified. No divisum Spleen:  Within normal limits in size and appearance. Adrenals/Urinary Tract: The adrenal glands are unremarkable. The kidneys are normal in size and position. No hydronephrosis. Simple cortical cyst noted within the mid to upper pole the left kidney. Stomach/Bowel: Visualized portions within the abdomen are unremarkable. Vascular/Lymphatic: No pathologically enlarged lymph nodes identified. No abdominal aortic aneurysm demonstrated. Other:  None. Musculoskeletal: No suspicious bone lesions identified. IMPRESSION: Cholelithiasis without superimposed inflammatory change to suggest acute cholecystitis. Choledocholithiasis with at least 3 small calculi measuring up to 3 mm in diameter within the distal common duct. Mild superimposed extrahepatic biliary ductal dilation. Electronically Signed   By: Fidela Salisbury M.D.   On: 03/11/2021 01:00   IR PERCUTANEOUS TRANSHEPATIC CHOLANGIOGRAM  Result Date: 03/15/2021 INDICATION: History of choledocholithiasis, post attempted though ultimately unsuccessful ERCP. Patient subsequent underwent laparoscopic cholecystectomy with intraoperative cholangiogram demonstrating an occlusive stone within the distal aspect of the CBD. As such patient presents for image guided placement an internal/external biliary drainage catheter Note, the patient's LFTs  have normalized and she has no significant intrahepatic biliary ductal dilatation on MRCP 03/11/2021 however Dr. Rush Landmark would like [place] to proceed with attempted internal/external biliary drainage catheter placement. EXAM: ULTRASOUND AND FLUOROSCOPIC GUIDED TRANSHEPATIC CHOLANGIOGRAM COMPARISON:  MRCP-03/11/2021 MEDICATIONS: Patient is currently admitted to the hospital receiving intravenous antibiotics; The antibiotic was administered with an appropriate time frame prior to the initiation of the procedure CONTRAST:  15 cc Isovue-300 - administered into the biliary tree. ANESTHESIA/SEDATION: Moderate (conscious) sedation was employed during this procedure. A total of Versed 8 mg, fentanyl 300 mcg and Dilaudid 1 mg was administered intravenously. Moderate Sedation Time: 70 minutes. The patient's level of consciousness and vital signs were monitored continuously by radiology nursing throughout the procedure under my direct supervision. FLUOROSCOPY TIME:  18.4 minutes (8115 mGy) COMPLICATIONS: None immediate. TECHNIQUE: Informed written consent was obtained from the patient after a discussion of the risks, benefits and alternatives to treatment. Questions regarding the procedure were encouraged and answered. A timeout was performed prior to the initiation of the procedure. Sonographic evaluation performed redemonstrated no significant intrahepatic biliary ductal dilatation. Additionally, there is poor sonographic window of both the left and right lobe of the liver secondary to patient body habitus. The right upper abdominal quadrant and midline of the upper abdomen was prepped and draped in the usual sterile fashion, and a sterile drape was applied covering the operative field. Maximum barrier sterile technique with sterile gowns and gloves were used for the procedure. A timeout was performed prior to the initiation of the procedure. Under direct ultrasound guidance, efforts were made to cannulate a nondilated duct  within left lobe liver however this ultimately proved unsuccessful. Next, under ultrasound guidance, after which were made to cannulate a nondilated duct within the peripheral aspect the right lobe liver Contrast injection was able to opacify a peripheral nondilated portion of the right intrahepatic biliary tree. Multiple spot fluoroscopic and radiographic images were obtained in various obliquities demonstrating patency of the CBD with passage of contrast to the  level duodenum and without a discrete residual filling defect to suggest residual choledocholithiasis. Prolonged efforts were made to cannulate the opacified right bile duct however this ultimately proved unsuccessful. The opacified bile duct was then targeted fluoroscopically however again, the duct could not be cannulated due to lack of dilatation as well as patient body habitus. At this time, the decision was made to cc further attempts at placement of an internal/external biliary drainage catheter. The procedure was terminated. Dressings were applied. The patient tolerated the procedure well without immediate postprocedural complication though was expectedly uncomfortable at the time of procedure completion. FINDINGS: Sonographic evaluation demonstrates no significant intrahepatic biliary ductal dilatation with poor sonographic window of both the right and left lobes liver secondary to patient body habitus and poor sonographic window. Ultimately, under a combination of ultrasound and fluoroscopic guidance, the peripheral aspect of a nondilated right intrahepatic biliary duct was opacified with intraoperative cholangiogram demonstrating restored patency of the CBD with passage of contrast to the level duodenum and without a definitive residual filling defect to suggest residual choledocholithiasis. Despite prolonged efforts, the opacified biliary tree could not be successfully cannulated secondary to lack of intrahepatic biliary ductal dilatation.  Redemonstrated pancreatic stent. IMPRESSION: 1. Attempted though ultimately unsuccessful placement of an internal/external biliary drainage catheter secondary to lack of intrahepatic biliary duct dilatation and patient body habitus. 2. Intra procedural cholangiogram demonstrates restored patency of the CBD with passage of contrast to the level duodenum and without a discrete residual filling defect to suggest residual choledocholithiasis. Electronically Signed   By: Sandi Mariscal M.D.   On: 03/15/2021 17:48   NM HEPATOBILIARY LEAK (POST-SURGICAL)  Result Date: 03/18/2021 CLINICAL DATA:  Evaluate for bowel leak. Status post cholecystectomy. EXAM: NUCLEAR MEDICINE HEPATOBILIARY IMAGING TECHNIQUE: Sequential images of the abdomen were obtained out to 60 minutes following intravenous administration of radiopharmaceutical. RADIOPHARMACEUTICALS:  7.5 mCi Tc-80m  Choletec IV COMPARISON:  MRI 03/17/21 FINDINGS: Prompt uptake and biliary excretion of activity by the liver is seen. During the first hour there is progressive abnormal radiotracer extravasation over the right hepatic lobe and extending along the inferior and medial margin of the right hepatic lobe. Radiotracer activity extends into the porta hepatic region as well as the right pericolic gutter. Some radiotracer accumulation is identified within the common bile duct and small bowel loops. IMPRESSION: Examination is positive for abnormal radiotracer accumulation over the right hepatic lobe and extending along the medial and inferior margin of the liver into the right pericolic gutter. Imaging findings are compatible with active bile leak. Electronically Signed   By: Kerby Moors M.D.   On: 03/18/2021 13:25   US Abdomen Limited RUQ (LIVER/GB)  Result Date: 03/10/2021 CLINICAL DATA:  Right upper quadrant pain EXAM: ULTRASOUND ABDOMEN LIMITED RIGHT UPPER QUADRANT COMPARISON:  Abdominal ultrasound 07/28/2017 FINDINGS: Gallbladder: A few dependent echogenic  shadowing calculi measuring up to 1 cm in size. No significant wall thickening or pericholecystic fluid visualized. Positive sonographic Murphy's sign per the technologist. Common bile duct: Diameter: 10 mm Liver: Coarse, increased echogenicity of the parenchyma with no focal mass identified. Portal vein is patent on color Doppler imaging with normal direction of blood flow towards the liver. Other: None. IMPRESSION: 1. Cholelithiasis with positive sonographic Murphy's sign per the technologist, suggesting acute calculus cholecystitis. 2. Dilated common bile duct, correlate for evidence of obstruction and consider MRCP if indicated. 3. Evidence of hepatic steatosis. Electronically Signed   By: Ofilia Neas M.D.   On: 03/10/2021 12:11  The results of significant diagnostics from this hospitalization (including imaging, microbiology, ancillary and laboratory) are listed below for reference.     Microbiology: Recent Results (from the past 240 hour(s))  Resp Panel by RT-PCR (Flu A&B, Covid) Nasopharyngeal Swab     Status: None   Collection Time: 03/10/21 10:06 AM   Specimen: Nasopharyngeal Swab; Nasopharyngeal(NP) swabs in vial transport medium  Result Value Ref Range Status   SARS Coronavirus 2 by RT PCR NEGATIVE NEGATIVE Final    Comment: (NOTE) SARS-CoV-2 target nucleic acids are NOT DETECTED.  The SARS-CoV-2 RNA is generally detectable in upper respiratory specimens during the acute phase of infection. The lowest concentration of SARS-CoV-2 viral copies this assay can detect is 138 copies/mL. A negative result does not preclude SARS-Cov-2 infection and should not be used as the sole basis for treatment or other patient management decisions. A negative result may occur with  improper specimen collection/handling, submission of specimen other than nasopharyngeal swab, presence of viral mutation(s) within the areas targeted by this assay, and inadequate number of viral copies(<138  copies/mL). A negative result must be combined with clinical observations, patient history, and epidemiological information. The expected result is Negative.  Fact Sheet for Patients:  EntrepreneurPulse.com.au  Fact Sheet for Healthcare Providers:  IncredibleEmployment.be  This test is no t yet approved or cleared by the [place] FDA and  has been authorized for detection and/or diagnosis of SARS-CoV-2 by FDA under an Emergency Use Authorization (EUA). This EUA will remain  in effect (meaning this test can be used) for the duration of the COVID-19 declaration under Section 564(b)(1) of the Act, 21 U.S.C.section 360bbb-3(b)(1), unless the authorization is terminated  or revoked sooner.       Influenza A by PCR NEGATIVE NEGATIVE Final   Influenza B by PCR NEGATIVE NEGATIVE Final    Comment: (NOTE) The Xpert Xpress SARS-CoV-2/FLU/RSV plus assay is intended as an aid in the diagnosis of influenza from Nasopharyngeal swab specimens and should not be used as a sole basis for treatment. Nasal washings and aspirates are unacceptable for Xpert Xpress SARS-CoV-2/FLU/RSV testing.  Fact Sheet for Patients: EntrepreneurPulse.com.au  Fact Sheet for Healthcare Providers: IncredibleEmployment.be  This test is not yet approved or cleared by the [place] FDA and has been authorized for detection and/or diagnosis of SARS-CoV-2 by FDA under an Emergency Use Authorization (EUA). This EUA will remain in effect (meaning this test can be used) for the duration of the COVID-19 declaration under Section 564(b)(1) of the Act, 21 U.S.C. section 360bbb-3(b)(1), unless the authorization is terminated or revoked.  Performed at [facility], [address] [place], [place] [address]   Culture, blood (routine x 2)     Status: None   Collection Time: 03/10/21  1:49 PM   Specimen: BLOOD  Result Value Ref Range  Status   Specimen Description BLOOD LEFT ANTECUBITAL  Final   Special Requests   Final    BOTTLES DRAWN AEROBIC ONLY Blood Culture adequate volume   Culture   Final    NO GROWTH 5 DAYS Performed at [facility], [place] [address], [place], [place] [address]    Report Status 03/15/2021 FINAL  Final  Culture, blood (routine x 2)     Status: None   Collection Time: 03/10/21  2:00 PM   Specimen: BLOOD  Result Value Ref Range Status   Specimen Description   Final    BLOOD Blood Culture adequate volume Performed at [facility]  [address] [place] [place], [place] [address]    Special Requests   Final    BOTTLES DRAWN AEROBIC AND ANAEROBIC BLOOD LEFT HAND Performed at [facility], [address] [place], [place] [address]    Culture   Final    NO GROWTH 5 DAYS Performed at [facility], [place] [address] [place], [place] [address]    Report Status 03/15/2021 FINAL  Final     Labs: BNP (last 3 results) No results for input(s): BNP in the last 8760 hours. Basic Metabolic Panel: Recent Labs  Lab 03/14/21 0600 03/15/21 0455 03/16/21 0448 03/17/21 0518 03/17/21 1627  NA 137 140 135 132* 133*  K 3.7 3.8 3.9 3.8 3.8  CL 109 107 102 98 96*  CO2 24 23 26 26 29   GLUCOSE 103* 97 110* 122* 107*  BUN 12 8 8 7 6   CREATININE 0.78 0.81 0.59 0.57 0.46  CALCIUM 8.4* 8.6* 8.8* 9.2 9.3  MG  --   --  1.9 1.9  --   PHOS  --   --  4.0 3.5  --    Liver Function Tests: Recent Labs  Lab 03/15/21 0455 03/16/21 0448 03/17/21 0518 03/17/21 1627 03/18/21 0519  AST 376* 208* 135* 133* 87*  ALT 611* 532* 385* 379* 287*  ALKPHOS 276* 270* 298* 338* 286*  BILITOT 1.5* 1.4* 1.9* 2.2* 2.1*  PROT 6.5 7.0 6.9 7.5 6.9  ALBUMIN 3.5 3.7 3.6 3.9 3.5   Recent Labs  Lab 03/12/21 0540  LIPASE 25   No results for input(s): AMMONIA in the last 168 hours. CBC: Recent Labs  Lab 03/12/21 0540 03/15/21 0455 03/16/21 0448 03/17/21 0518  WBC 5.9 6.3 9.0 10.9*   NEUTROABS  --   --  6.7 8.6*  HGB 13.5 13.1 13.1 13.3  HCT 39.8 40.1 40.1 38.8  MCV 90.9 92.8 91.8 90.0  PLT 152 149* 148* 144*   Cardiac Enzymes: No results for input(s): CKTOTAL, CKMB, CKMBINDEX, TROPONINI in the last 168 hours. BNP: Invalid input(s): POCBNP CBG: No results for input(s): GLUCAP in the last 168 hours. D-Dimer No results for input(s): DDIMER in the last 72 hours. Hgb A1c No results for input(s): HGBA1C in the last 72 hours. Lipid Profile No results for input(s): CHOL, HDL, LDLCALC, TRIG, CHOLHDL, LDLDIRECT in the last 72 hours. Thyroid function studies No results for input(s): TSH, T4TOTAL, T3FREE, THYROIDAB in the last 72 hours.  Invalid input(s): FREET3 Anemia work up No results for input(s): VITAMINB12, FOLATE, FERRITIN, TIBC, IRON, RETICCTPCT in the last 72 hours. Urinalysis    Component Value Date/Time   COLORURINE AMBER (A) 03/10/2021 1006   APPEARANCEUR CLEAR 03/10/2021 1006   LABSPEC >=1.030 03/10/2021 1006   PHURINE 6.0 03/10/2021 1006   GLUCOSEU 100 (A) 03/10/2021 1006   HGBUR NEGATIVE 03/10/2021 1006   BILIRUBINUR LARGE (A) 03/10/2021 1006   KETONESUR NEGATIVE 03/10/2021 1006   PROTEINUR 30 (A) 03/10/2021 1006   UROBILINOGEN 0.2 09/12/2009 0256   NITRITE NEGATIVE 03/10/2021 1006   LEUKOCYTESUR NEGATIVE 03/10/2021 1006   Sepsis Labs Invalid input(s): PROCALCITONIN,  WBC,  LACTICIDVEN Microbiology Recent Results (from the past 240 hour(s))  Resp Panel by RT-PCR (Flu A&B, Covid) Nasopharyngeal Swab     Status: None   Collection Time: 03/10/21 10:06 AM   Specimen: Nasopharyngeal Swab; Nasopharyngeal(NP) swabs in vial transport medium  Result Value Ref Range Status   SARS Coronavirus 2 by RT PCR NEGATIVE NEGATIVE Final  Comment: (NOTE) SARS-CoV-2 target nucleic acids are NOT DETECTED.  The SARS-CoV-2 RNA is generally detectable in upper respiratory specimens during the acute phase of infection. The lowest concentration of SARS-CoV-2  viral copies this assay can detect is 138 copies/mL. A negative result does not preclude SARS-Cov-2 infection and should not be used as the sole basis for treatment or other patient management decisions. A negative result may occur with  improper specimen collection/handling, submission of specimen other than nasopharyngeal swab, presence of viral mutation(s) within the areas targeted by this assay, and inadequate number of viral copies(<138 copies/mL). A negative result must be combined with clinical observations, patient history, and epidemiological information. The expected result is Negative.  Fact Sheet for Patients:  EntrepreneurPulse.com.au  Fact Sheet for Healthcare Providers:  IncredibleEmployment.be  This test is no t yet approved or cleared by the [place] FDA and  has been authorized for detection and/or diagnosis of SARS-CoV-2 by FDA under an Emergency Use Authorization (EUA). This EUA will remain  in effect (meaning this test can be used) for the duration of the COVID-19 declaration under Section 564(b)(1) of the Act, 21 U.S.C.section 360bbb-3(b)(1), unless the authorization is terminated  or revoked sooner.       Influenza A by PCR NEGATIVE NEGATIVE Final   Influenza B by PCR NEGATIVE NEGATIVE Final    Comment: (NOTE) The Xpert Xpress SARS-CoV-2/FLU/RSV plus assay is intended as an aid in the diagnosis of influenza from Nasopharyngeal swab specimens and should not be used as a sole basis for treatment. Nasal washings and aspirates are unacceptable for Xpert Xpress SARS-CoV-2/FLU/RSV testing.  Fact Sheet for Patients: EntrepreneurPulse.com.au  Fact Sheet for Healthcare Providers: IncredibleEmployment.be  This test is not yet approved or cleared by the [place] FDA and has been authorized for detection and/or diagnosis of SARS-CoV-2 by FDA under an Emergency Use Authorization  (EUA). This EUA will remain in effect (meaning this test can be used) for the duration of the COVID-19 declaration under Section 564(b)(1) of the Act, 21 U.S.C. section 360bbb-3(b)(1), unless the authorization is terminated or revoked.  Performed at [facility], [address] [place], [place] [address]   Culture, blood (routine x 2)     Status: None   Collection Time: 03/10/21  1:49 PM   Specimen: BLOOD  Result Value Ref Range Status   Specimen Description BLOOD LEFT ANTECUBITAL  Final   Special Requests   Final    BOTTLES DRAWN AEROBIC ONLY Blood Culture adequate volume   Culture   Final    NO GROWTH 5 DAYS Performed at [facility], [place] [address] [place], [place] [address]    Report Status 03/15/2021 FINAL  Final  Culture, blood (routine x 2)     Status: None   Collection Time: 03/10/21  2:00 PM   Specimen: BLOOD  Result Value Ref Range Status   Specimen Description   Final    BLOOD Blood Culture adequate volume Performed at [facility], [address] [place], [place] [address]    Special Requests   Final    BOTTLES DRAWN AEROBIC AND ANAEROBIC BLOOD LEFT HAND Performed at [facility], [address], [place], [place] [address]    Culture   Final    NO GROWTH 5 DAYS Performed at [facility], [place] [address] [place], [place] [address]    Report Status 03/15/2021 FINAL  Final     Time coordinating discharge  in minutes: 65  SIGNED:   Debbe Odea, MD  [facility] 03/18/2021, 4:24 PM

## 2021-03-18 NOTE — Anesthesia Preprocedure Evaluation (Deleted)
Anesthesia Evaluation    Airway        Dental   Pulmonary asthma , COPD,  COPD inhaler, Current Smoker and Patient abstained from smoking.,           Cardiovascular      Neuro/Psych    GI/Hepatic GERD  ,Choledocholithiasis, s/p cholecystectomy: residual bile leak Elevated LFTs IBS Lynch syndrome   Endo/Other  Morbid obesity  Renal/GU      Musculoskeletal   Abdominal   Peds  Hematology   Anesthesia Other Findings   Reproductive/Obstetrics                             Anesthesia Physical Anesthesia Plan  ASA: 3  Anesthesia Plan: General   Post-op Pain Management: Minimal or no pain anticipated and Toradol IV (intra-op)   Induction: Intravenous  PONV Risk Score and Plan: Ondansetron and Dexamethasone  Airway Management Planned: Oral ETT  Additional Equipment: None  Intra-op Plan:   Post-operative Plan: Extubation in OR  Informed Consent:   Plan Discussed with:   Anesthesia Plan Comments:         Anesthesia Quick Evaluation

## 2021-03-18 NOTE — Progress Notes (Signed)
Progress Note  5 Days Post-Op  Subjective: Pt reports ruq pain. Some intermittent n/v. She passed flatus once this AM but no bowel function in a few days.   Objective: Vital signs in last 24 hours: Temp:  [97.9 F (36.6 C)-98.7 F (37.1 C)] 98.7 F (37.1 C) (12/29 0453) Pulse Rate:  [103-109] 109 (12/29 0453) Resp:  [18-20] 18 (12/29 0453) BP: (123-173)/(76-97) 132/76 (12/29 0928) SpO2:  [88 %-98 %] 92 % (12/29 0453) Last BM Date: 03/17/21 (after enema)  Intake/Output from previous day: 12/28 0701 - 12/29 0700 In: 1655 [I.V.:1051.5; IV Piggyback:603.5] Out: 950 [Urine:950] Intake/Output this shift: No intake/output data recorded.  PE: General: pleasant, WD, overweight female who is laying in bed in NAD HEENT: Sclera are anicteric.  Heart: regular, rate, and rhythm.   Lungs: Respiratory effort nonlabored Abd: soft, mildly ttp in RUQ, ND, BS hypoactive, incisions c/d/i Psych: A&Ox3 with an appropriate affect.    Lab Results:  Recent Labs    03/16/21 0448 03/17/21 0518  WBC 9.0 10.9*  HGB 13.1 13.3  HCT 40.1 38.8  PLT 148* 144*   BMET Recent Labs    03/17/21 0518 03/17/21 1627  NA 132* 133*  K 3.8 3.8  CL 98 96*  CO2 26 29  GLUCOSE 122* 107*  BUN 7 6  CREATININE 0.57 0.46  CALCIUM 9.2 9.3   PT/INR No results for input(s): LABPROT, INR in the last 72 hours. CMP     Component Value Date/Time   NA 133 (L) 03/17/2021 1627   NA 140 08/21/2018 0000   K 3.8 03/17/2021 1627   CL 96 (L) 03/17/2021 1627   CO2 29 03/17/2021 1627   GLUCOSE 107 (H) 03/17/2021 1627   BUN 6 03/17/2021 1627   BUN 11 08/21/2018 0000   CREATININE 0.46 03/17/2021 1627   CALCIUM 9.3 03/17/2021 1627   PROT 6.9 03/18/2021 0519   PROT 6.6 08/21/2018 0000   ALBUMIN 3.5 03/18/2021 0519   ALBUMIN 4.5 08/21/2018 0000   AST 87 (H) 03/18/2021 0519   ALT 287 (H) 03/18/2021 0519   ALKPHOS 286 (H) 03/18/2021 0519   BILITOT 2.1 (H) 03/18/2021 0519   BILITOT 0.2 08/21/2018 0000    GFRNONAA >60 03/17/2021 1627   GFRAA 122 08/21/2018 0000   Lipase     Component Value Date/Time   LIPASE 25 03/12/2021 0540       Studies/Results: MR 3D Recon At Scanner  Result Date: 03/17/2021 CLINICAL DATA:  Abdominal pain with elevated liver function studies status post recent cholecystectomy and ERCP. Biliary obstruction suspected. Choledocholithiasis on previous intraoperative cholangiogram with unsuccessful internal/external biliary drainage catheter placement 2 days prior. EXAM: MRI ABDOMEN WITHOUT AND WITH CONTRAST (INCLUDING MRCP) TECHNIQUE: Multiplanar multisequence MR imaging of the abdomen was performed both before and after the administration of intravenous contrast. Heavily T2-weighted images of the biliary and pancreatic ducts were obtained, and three-dimensional MRCP images were rendered by post processing. CONTRAST:  55mL GADAVIST GADOBUTROL 1 MMOL/ML IV SOLN COMPARISON:  MRCP 03/11/2021, ERCP 03/12/2021 and intraoperative cholangiogram 03/13/2021. FINDINGS: Lower chest: Mild dependent atelectasis at both lung bases. No significant pleural effusion. Bilateral breast implants are noted. Hepatobiliary: Interval cholecystectomy without significant focal fluid collection in the cholecystectomy bed. There is a moderate amount of perihepatic fluid which is new. A small peripherally enhancing collection in the right hepatic lobe measures 1.3 cm on image 33/30, not present previously and likely related to attempted percutaneous biliary drainage catheter placement. There is mild intra and extrahepatic  biliary dilatation with the common hepatic duct measuring up to 12 mm in diameter. There are 2 probable dependent punctate stones in the distal common bile duct, best seen on coronal image 20/10. Pancreas: Unremarkable. No evidence of pancreatic mass, pancreatic ductal dilatation or surrounding inflammation. Spleen: Normal in size without focal abnormality. Adrenals/Urinary Tract: Both adrenal  glands appear normal. Stable small left renal cyst. No hydronephrosis. Stomach/Bowel: The stomach appears unremarkable for its degree of distension. No evidence of bowel wall thickening, distention or surrounding inflammatory change. Vascular/Lymphatic: There are no enlarged abdominal lymph nodes. No significant vascular findings. Other: As above, new ascites, primarily around the liver. Musculoskeletal: No acute or significant osseous findings. IMPRESSION: 1. Persistent mild biliary dilatation status post cholecystectomy with probable small punctate distal common bile duct stones. 2. New ascites, primarily around the liver. Although nonspecific, in the setting of recent cholecystectomy, this is suspicious for a bile leak. This could be further evaluated with nuclear medicine hepatobiliary scan. 3. New small collection centrally in the right hepatic lobe attributed to recent attempted percutaneous biliary drainage catheter placement. 4. No evidence of pancreatitis or pancreatic ductal dilatation. Electronically Signed   By: Richardean Sale M.D.   On: 03/17/2021 15:56   MR ABDOMEN MRCP W WO CONTAST  Result Date: 03/17/2021 CLINICAL DATA:  Abdominal pain with elevated liver function studies status post recent cholecystectomy and ERCP. Biliary obstruction suspected. Choledocholithiasis on previous intraoperative cholangiogram with unsuccessful internal/external biliary drainage catheter placement 2 days prior. EXAM: MRI ABDOMEN WITHOUT AND WITH CONTRAST (INCLUDING MRCP) TECHNIQUE: Multiplanar multisequence MR imaging of the abdomen was performed both before and after the administration of intravenous contrast. Heavily T2-weighted images of the biliary and pancreatic ducts were obtained, and three-dimensional MRCP images were rendered by post processing. CONTRAST:  41mL GADAVIST GADOBUTROL 1 MMOL/ML IV SOLN COMPARISON:  MRCP 03/11/2021, ERCP 03/12/2021 and intraoperative cholangiogram 03/13/2021. FINDINGS: Lower  chest: Mild dependent atelectasis at both lung bases. No significant pleural effusion. Bilateral breast implants are noted. Hepatobiliary: Interval cholecystectomy without significant focal fluid collection in the cholecystectomy bed. There is a moderate amount of perihepatic fluid which is new. A small peripherally enhancing collection in the right hepatic lobe measures 1.3 cm on image 33/30, not present previously and likely related to attempted percutaneous biliary drainage catheter placement. There is mild intra and extrahepatic biliary dilatation with the common hepatic duct measuring up to 12 mm in diameter. There are 2 probable dependent punctate stones in the distal common bile duct, best seen on coronal image 20/10. Pancreas: Unremarkable. No evidence of pancreatic mass, pancreatic ductal dilatation or surrounding inflammation. Spleen: Normal in size without focal abnormality. Adrenals/Urinary Tract: Both adrenal glands appear normal. Stable small left renal cyst. No hydronephrosis. Stomach/Bowel: The stomach appears unremarkable for its degree of distension. No evidence of bowel wall thickening, distention or surrounding inflammatory change. Vascular/Lymphatic: There are no enlarged abdominal lymph nodes. No significant vascular findings. Other: As above, new ascites, primarily around the liver. Musculoskeletal: No acute or significant osseous findings. IMPRESSION: 1. Persistent mild biliary dilatation status post cholecystectomy with probable small punctate distal common bile duct stones. 2. New ascites, primarily around the liver. Although nonspecific, in the setting of recent cholecystectomy, this is suspicious for a bile leak. This could be further evaluated with nuclear medicine hepatobiliary scan. 3. New small collection centrally in the right hepatic lobe attributed to recent attempted percutaneous biliary drainage catheter placement. 4. No evidence of pancreatitis or pancreatic ductal dilatation.  Electronically Signed   By:  Richardean Sale M.D.   On: 03/17/2021 15:56    Anti-infectives: Anti-infectives (From admission, onward)    Start     Dose/Rate Route Frequency Ordered Stop   03/17/21 1715  cefTRIAXone (ROCEPHIN) 2 g in sodium chloride 0.9 % 100 mL IVPB  Status:  Discontinued       Note to Pharmacy: This will be given in IR on 12/26 for internal/external biliary drain placement planned for 0900   2 g 200 mL/hr over 30 Minutes Intravenous  Once 03/17/21 1616 03/17/21 1840   03/13/21 0809  piperacillin-tazobactam (ZOSYN) 3.375 GM/50ML IVPB       Note to Pharmacy: Christell Faith L: cabinet override      03/13/21 0809 03/13/21 0849   03/12/21 1245  ceFAZolin (ANCEF) IVPB 2g/100 mL premix  Status:  Discontinued        2 g 200 mL/hr over 30 Minutes Intravenous On call to O.R. 03/12/21 1218 03/12/21 1838   03/11/21 0000  piperacillin-tazobactam (ZOSYN) IVPB 3.375 g        3.375 g 12.5 mL/hr over 240 Minutes Intravenous Every 8 hours 03/10/21 2336     03/10/21 1345  piperacillin-tazobactam (ZOSYN) IVPB 3.375 g        3.375 g 100 mL/hr over 30 Minutes Intravenous  Once 03/10/21 1330 03/10/21 1635        Assessment/Plan Acute cholecystitis with choledocholithiasis POD5 s/p laparoscopic cholecystectomy with IOC 12/24 - s/p failed ERCP pre-op - s/p failed transhepatic drain placement by IR - MRCP yesterday suggestive of possible bile leak, HIDA ordered today to confirm - GI trying to coordinate, may need transfer to tertiary center for ERCP - no other surgical recommendations at this time but we will continue to follow along   FEN: NPO this AM, IVF @75  cc/h VTE: SCDs ID: [email or web address] 12/22>>  LOS: 8 days    Norm Parcel, [facility] 03/18/2021, 9:53 AM Please see Amion for pager number during day hours 7:00am-4:30pm

## 2021-03-18 NOTE — Progress Notes (Addendum)
Progress Note Hospital Day: 9  Chief Complaint:   Bile duct stones / possible bile duct leak      Attending physician's note   I have taken an interval history, reviewed the chart and examined the patient. I agree with the Advanced Practitioner's note, impression and recommendations.   HIDA scan positive for bile leak  She has Perihepatic and right pericolic gutter fluid collection.  Complains of right upper and lower abdominal pain radiating to the back.  S/p failed ERCP with choledocholithiasis, status postcholecystectomy and s/p failed percutaneous biliary catheterization by IR  Dr. Harl Bowie at [facility] is willing to reattempt ERCP tomorrow if patient is able to transfer to [place] based on bed availability there and acceptance by hospitalist team  Will defer to surgery regarding placement of percutaneous catheter to drain  perihepatic fluid collection  Continue pain control Continue Zosyn N.p.o. after midnight  I have spent 35 minutes of patient care (this includes precharting, chart review, review of results, face-to-face time used for counseling as well as treatment plan and follow-up. The patient was provided an opportunity to ask questions and all were answered. The patient agreed with the plan and demonstrated an understanding of the instructions.  Damaris Hippo , MD [phone]     ASSESSMENT AND PLAN   Brief History: 50 yo female with past medical history of lynch syndrome / multiple tubular adenomas and sessile serrated colon polyps, bilateral mastectomies, IBS, diverticulosis, GERD, asthma. Admitted 12/21 with biliary obstruction /  cholelithiasis / acute cholecystitis / choledocholithiasis   # Cholelithiasis / choledocholithiasis. ERCP 12/23 was incomplete --> biliary cannulation was not possible.  Temporary plastic biliary stent placed. Underwent lap chole with IOC on 12/24, stone unable to be pushed out during surgery. Interventional  Radiology attempt at PBD was unsuccessful. Persistent CBD stones and ? Bile duct leak on MRCP yesterday ( done for worsening LFTs and abdominal pain)  --HIDA not yet done.  --RUQ abdominal pain worse today. Slight improvement in alk phos overnight 338 >> 286. Tbili still ~ 2. AST and ALT continue to improve.   --Our biliary endoscopist has no availability to reattempt ERCP for bile duct stone removal. We are contacting [facility] about inpatient transfer. --General Surgery is aware   --Continue Zosyn --Patient and husband updated on plan of treatment   DIAGNOSTIC STUDIES THIS ADMISSION:   03/17/21 MRCP 1. Persistent mild biliary dilatation status post cholecystectomy with probable small punctate distal common bile duct stones. 2. New ascites, primarily around the liver. Although nonspecific, in the setting of recent cholecystectomy, this is suspicious for a bile leak. This could be further evaluated with nuclear medicine hepatobiliary scan. 3. New small collection centrally in the right hepatic lobe attributed to recent attempted percutaneous biliary drainage catheter placement. 4. No evidence of pancreatitis or pancreatic ductal dilatation     SUBJECTIVE   RUQ pain is getting worse. Hurts to ambulate.     OBJECTIVE      Scheduled inpatient medications:   diclofenac Sodium  2 g Topical QID   indomethacin  100 mg Rectal Once   pantoprazole  20 mg Oral Daily   polyethylene glycol  17 g Oral BID   senna  2 tablet Oral QHS   Continuous inpatient infusions:   sodium chloride 75 mL/hr at 03/18/21 0722   lactated ringers 10 mL/hr at 03/13/21 0827   piperacillin-tazobactam (ZOSYN)  IV 3.375 g (03/18/21 0727)   promethazine (PHENERGAN) injection (IM or IVPB)  25 mg (03/17/21 1422)   PRN inpatient medications: acetaminophen **OR** acetaminophen, alum & mag hydroxide-simeth, bisacodyl, clonazepam, docusate sodium, HYDROmorphone (DILAUDID) injection, iohexol, lidocaine, lip balm,  midazolam, Muscle Rub, ondansetron **OR** ondansetron (ZOFRAN) IV, oxyCODONE **OR** oxyCODONE, oxyCODONE-acetaminophen, promethazine (PHENERGAN) injection (IM or IVPB), traZODone  Vital signs in last 24 hours: Temp:  [97.9 F (36.6 C)-98.7 F (37.1 C)] 98.7 F (37.1 C) (12/29 0453) Pulse Rate:  [103-109] 109 (12/29 0453) Resp:  [18-20] 18 (12/29 0453) BP: (123-173)/(77-97) 173/97 (12/29 0453) SpO2:  [88 %-98 %] 92 % (12/29 0453) Last BM Date: 03/17/21 (after enema)  Intake/Output Summary (Last 24 hours) at 03/18/2021 0921 Last data filed at 03/18/2021 0357 Gross per 24 hour  Intake 1655.02 ml  Output 950 ml  Net 705.02 ml     Physical Exam:  General: Alert female in NAD Heart:  Regular rate and rhythm. No lower extremity edema Pulmonary: Normal respiratory effort Abdomen: Soft, nondistended, moderate RUQ tenderness. Normal bowel sounds.  Neurologic: Alert and oriented Psych: Pleasant. Cooperative.   Filed Weights   03/10/21 0949  Weight: 115.7 kg    Intake/Output from previous day: 12/28 0701 - 12/29 0700 In: 1655 [I.V.:1051.5; IV Piggyback:603.5] Out: 950 [Urine:950] Intake/Output this shift: No intake/output data recorded.    Lab Results: Recent Labs    03/16/21 0448 03/17/21 0518  WBC 9.0 10.9*  HGB 13.1 13.3  HCT 40.1 38.8  PLT 148* 144*   BMET Recent Labs    03/16/21 0448 03/17/21 0518 03/17/21 1627  NA 135 132* 133*  K 3.9 3.8 3.8  CL 102 98 96*  CO2 _0 GLUCOSE 110* 122* 107*  BUN _1 CREATININE 0.59 0.57 0.46  CALCIUM 8.8* 9.2 9.3   LFT Recent Labs    03/18/21 0519  PROT 6.9  ALBUMIN 3.5  AST 87*  ALT 287*  ALKPHOS 286*  BILITOT 2.1*  BILIDIR 0.9*  IBILI 1.2*   PT/INR No results for input(s): LABPROT, INR in the last 72 hours. Hepatitis Panel No results for input(s): HEPBSAG, HCVAB, HEPAIGM, HEPBIGM in the last 72 hours.  MR 3D Recon At Scanner  Result Date: 03/17/2021 CLINICAL DATA:  Abdominal pain with  elevated liver function studies status post recent cholecystectomy and ERCP. Biliary obstruction suspected. Choledocholithiasis on previous intraoperative cholangiogram with unsuccessful internal/external biliary drainage catheter placement 2 days prior. EXAM: MRI ABDOMEN WITHOUT AND WITH CONTRAST (INCLUDING MRCP) TECHNIQUE: Multiplanar multisequence MR imaging of the abdomen was performed both before and after the administration of intravenous contrast. Heavily T2-weighted images of the biliary and pancreatic ducts were obtained, and three-dimensional MRCP images were rendered by post processing. CONTRAST:  55m GADAVIST GADOBUTROL 1 MMOL/ML IV SOLN COMPARISON:  MRCP 03/11/2021, ERCP 03/12/2021 and intraoperative cholangiogram 03/13/2021. FINDINGS: Lower chest: Mild dependent atelectasis at both lung bases. No significant pleural effusion. Bilateral breast implants are noted. Hepatobiliary: Interval cholecystectomy without significant focal fluid collection in the cholecystectomy bed. There is a moderate amount of perihepatic fluid which is new. A small peripherally enhancing collection in the right hepatic lobe measures 1.3 cm on image 33/30, not present previously and likely related to attempted percutaneous biliary drainage catheter placement. There is mild intra and extrahepatic biliary dilatation with the common hepatic duct measuring up to 12 mm in diameter. There are 2 probable dependent punctate stones in the distal common bile duct, best seen on coronal image 20/10. Pancreas: Unremarkable. No evidence of pancreatic mass, pancreatic ductal dilatation or surrounding inflammation. Spleen: Normal in  size without focal abnormality. Adrenals/Urinary Tract: Both adrenal glands appear normal. Stable small left renal cyst. No hydronephrosis. Stomach/Bowel: The stomach appears unremarkable for its degree of distension. No evidence of bowel wall thickening, distention or surrounding inflammatory change.  Vascular/Lymphatic: There are no enlarged abdominal lymph nodes. No significant vascular findings. Other: As above, new ascites, primarily around the liver. Musculoskeletal: No acute or significant osseous findings. IMPRESSION: 1. Persistent mild biliary dilatation status post cholecystectomy with probable small punctate distal common bile duct stones. 2. New ascites, primarily around the liver. Although nonspecific, in the setting of recent cholecystectomy, this is suspicious for a bile leak. This could be further evaluated with nuclear medicine hepatobiliary scan. 3. New small collection centrally in the right hepatic lobe attributed to recent attempted percutaneous biliary drainage catheter placement. 4. No evidence of pancreatitis or pancreatic ductal dilatation. Electronically Signed   By: Richardean Sale M.D.   On: 03/17/2021 15:56   MR ABDOMEN MRCP W WO CONTAST  Result Date: 03/17/2021 CLINICAL DATA:  Abdominal pain with elevated liver function studies status post recent cholecystectomy and ERCP. Biliary obstruction suspected. Choledocholithiasis on previous intraoperative cholangiogram with unsuccessful internal/external biliary drainage catheter placement 2 days prior. EXAM: MRI ABDOMEN WITHOUT AND WITH CONTRAST (INCLUDING MRCP) TECHNIQUE: Multiplanar multisequence MR imaging of the abdomen was performed both before and after the administration of intravenous contrast. Heavily T2-weighted images of the biliary and pancreatic ducts were obtained, and three-dimensional MRCP images were rendered by post processing. CONTRAST:  7m GADAVIST GADOBUTROL 1 MMOL/ML IV SOLN COMPARISON:  MRCP 03/11/2021, ERCP 03/12/2021 and intraoperative cholangiogram 03/13/2021. FINDINGS: Lower chest: Mild dependent atelectasis at both lung bases. No significant pleural effusion. Bilateral breast implants are noted. Hepatobiliary: Interval cholecystectomy without significant focal fluid collection in the cholecystectomy bed.  There is a moderate amount of perihepatic fluid which is new. A small peripherally enhancing collection in the right hepatic lobe measures 1.3 cm on image 33/30, not present previously and likely related to attempted percutaneous biliary drainage catheter placement. There is mild intra and extrahepatic biliary dilatation with the common hepatic duct measuring up to 12 mm in diameter. There are 2 probable dependent punctate stones in the distal common bile duct, best seen on coronal image 20/10. Pancreas: Unremarkable. No evidence of pancreatic mass, pancreatic ductal dilatation or surrounding inflammation. Spleen: Normal in size without focal abnormality. Adrenals/Urinary Tract: Both adrenal glands appear normal. Stable small left renal cyst. No hydronephrosis. Stomach/Bowel: The stomach appears unremarkable for its degree of distension. No evidence of bowel wall thickening, distention or surrounding inflammatory change. Vascular/Lymphatic: There are no enlarged abdominal lymph nodes. No significant vascular findings. Other: As above, new ascites, primarily around the liver. Musculoskeletal: No acute or significant osseous findings. IMPRESSION: 1. Persistent mild biliary dilatation status post cholecystectomy with probable small punctate distal common bile duct stones. 2. New ascites, primarily around the liver. Although nonspecific, in the setting of recent cholecystectomy, this is suspicious for a bile leak. This could be further evaluated with nuclear medicine hepatobiliary scan. 3. New small collection centrally in the right hepatic lobe attributed to recent attempted percutaneous biliary drainage catheter placement. 4. No evidence of pancreatitis or pancreatic ductal dilatation. Electronically Signed   By: WRichardean SaleM.D.   On: 03/17/2021 15:56        Principal Problem:   Choledocholithiasis with acute cholecystitis Active Problems:   Elevated LFTs   Cholecystitis   Bile leak     LOS: 8 days    PTye Savoy,NP  03/18/2021, 9:21 AM

## 2021-03-19 DIAGNOSIS — Z9013 Acquired absence of bilateral breasts and nipples: Secondary | ICD-10-CM | POA: Diagnosis not present

## 2021-03-19 DIAGNOSIS — Z9049 Acquired absence of other specified parts of digestive tract: Secondary | ICD-10-CM | POA: Diagnosis not present

## 2021-03-19 DIAGNOSIS — K831 Obstruction of bile duct: Secondary | ICD-10-CM | POA: Diagnosis not present

## 2021-03-19 DIAGNOSIS — Z1509 Genetic susceptibility to other malignant neoplasm: Secondary | ICD-10-CM | POA: Diagnosis not present

## 2021-03-19 DIAGNOSIS — K838 Other specified diseases of biliary tract: Secondary | ICD-10-CM | POA: Diagnosis not present

## 2021-03-19 DIAGNOSIS — Z91030 Bee allergy status: Secondary | ICD-10-CM | POA: Diagnosis not present

## 2021-03-19 DIAGNOSIS — K567 Ileus, unspecified: Secondary | ICD-10-CM | POA: Diagnosis not present

## 2021-03-19 DIAGNOSIS — K8041 Calculus of bile duct with cholecystitis, unspecified, with obstruction: Secondary | ICD-10-CM | POA: Diagnosis not present

## 2021-03-19 DIAGNOSIS — Z90710 Acquired absence of both cervix and uterus: Secondary | ICD-10-CM | POA: Diagnosis not present

## 2021-03-19 SURGERY — ERCP, WITH INTERVENTION IF INDICATED
Anesthesia: General

## 2021-03-19 NOTE — Progress Notes (Signed)
RN received call from [place] at [facility] to inform her of pt having an available bed at [facility]. Pt informed of the available bed and MD on-call paged and notified. Report called off to [facility] receiving RN Lurlean Horns and [facility] notified. Pt placed on transport list and RN to be notified for report when transport available. VSS, pt called spouse to come pick up her belongings and spouse given pt belongings to send home. Will continue to closely monitor pt till pick up by [facility]. Delia Heady RN

## 2021-03-19 NOTE — Progress Notes (Signed)
Pt picked up by [facility], pt pre-medicated with pain medication prior to transport. Receiving RN called and notified. Pt transported off unit via stretcher with IV intact and transfusing. Delia Heady RN

## 2021-03-23 ENCOUNTER — Telehealth: Payer: Self-pay | Admitting: Nurse Practitioner

## 2021-03-23 NOTE — Telephone Encounter (Signed)
Inbound call from patient  states she would like a call back to discuss upcoming procedure. States she was in the hospital at [facility] for biliary obstriction and need to know if she is still okay for her procedure 03/30/2021

## 2021-03-23 NOTE — Telephone Encounter (Signed)
Patient is aware I am waiting for instructions from Dr Ardis Hughs.

## 2021-03-23 NOTE — Telephone Encounter (Signed)
Just had ERCP for bile leak and retained gall stone. She is scheduled for her colonoscopy and endoscopy on 03/30/21. Do we need to reschedule?

## 2021-03-24 NOTE — Telephone Encounter (Signed)
Spoke with the patient and scheduled the follow up.

## 2021-03-30 ENCOUNTER — Encounter: Payer: BC Managed Care – PPO | Admitting: Gastroenterology

## 2021-04-06 DIAGNOSIS — R110 Nausea: Secondary | ICD-10-CM | POA: Diagnosis not present

## 2021-04-19 DIAGNOSIS — Z9689 Presence of other specified functional implants: Secondary | ICD-10-CM | POA: Diagnosis not present

## 2021-04-19 DIAGNOSIS — Z4659 Encounter for fitting and adjustment of other gastrointestinal appliance and device: Secondary | ICD-10-CM | POA: Diagnosis not present

## 2021-04-19 DIAGNOSIS — Z6841 Body Mass Index (BMI) 40.0 and over, adult: Secondary | ICD-10-CM | POA: Diagnosis not present

## 2021-04-19 DIAGNOSIS — K839 Disease of biliary tract, unspecified: Secondary | ICD-10-CM | POA: Diagnosis not present

## 2021-04-19 DIAGNOSIS — Z4689 Encounter for fitting and adjustment of other specified devices: Secondary | ICD-10-CM | POA: Diagnosis not present

## 2021-04-19 DIAGNOSIS — K838 Other specified diseases of biliary tract: Secondary | ICD-10-CM | POA: Diagnosis not present

## 2021-04-19 DIAGNOSIS — K317 Polyp of stomach and duodenum: Secondary | ICD-10-CM | POA: Diagnosis not present

## 2021-04-19 DIAGNOSIS — Z87891 Personal history of nicotine dependence: Secondary | ICD-10-CM | POA: Diagnosis not present

## 2021-04-21 ENCOUNTER — Other Ambulatory Visit: Payer: Self-pay

## 2021-04-21 ENCOUNTER — Encounter: Payer: Self-pay | Admitting: Gastroenterology

## 2021-04-21 ENCOUNTER — Other Ambulatory Visit (INDEPENDENT_AMBULATORY_CARE_PROVIDER_SITE_OTHER): Payer: BC Managed Care – PPO

## 2021-04-21 ENCOUNTER — Ambulatory Visit: Payer: BC Managed Care – PPO | Admitting: Gastroenterology

## 2021-04-21 ENCOUNTER — Ambulatory Visit (INDEPENDENT_AMBULATORY_CARE_PROVIDER_SITE_OTHER)
Admission: RE | Admit: 2021-04-21 | Discharge: 2021-04-21 | Disposition: A | Discharge status: Home / Self Care | Payer: BC Managed Care – PPO | Source: Ambulatory Visit | Attending: Gastroenterology | Admitting: Gastroenterology

## 2021-04-21 DIAGNOSIS — R059 Cough, unspecified: Secondary | ICD-10-CM

## 2021-04-21 DIAGNOSIS — R110 Nausea: Secondary | ICD-10-CM

## 2021-04-21 DIAGNOSIS — Z1509 Genetic susceptibility to other malignant neoplasm: Secondary | ICD-10-CM

## 2021-04-21 LAB — CBC
HCT: 39.9 % (ref 36.0–46.0)
Hemoglobin: 13.4 g/dL (ref 12.0–15.0)
MCHC: 33.5 g/dL (ref 30.0–36.0)
MCV: 88.8 fl (ref 78.0–100.0)
Platelets: 186 10*3/uL (ref 150.0–400.0)
RBC: 4.5 Mil/uL (ref 3.87–5.11)
RDW: 13.1 % (ref 11.5–15.5)
WBC: 5.7 10*3/uL (ref 4.0–10.5)

## 2021-04-21 NOTE — Patient Instructions (Signed)
If you are age 49 or older, your body mass index should be between 23-30. Your Body mass index is 46.09 kg/m. If this is out of the aforementioned range listed, please consider follow up with your Primary Care Provider.  If you are age 47 or younger, your body mass index should be between 19-25. Your Body mass index is 46.09 kg/m. If this is out of the aformentioned range listed, please consider follow up with your Primary Care Provider.   ________________________________________________________  The [facility] providers would like to encourage you to use [facility] to communicate with providers for non-urgent requests or questions.  Due to long hold times on the telephone, sending your provider a message by [facility] may be a faster and more efficient way to get a response.  Please allow 48 business hours for a response.  Please remember that this is for non-urgent requests.  _______________________________________________________  Your provider has requested that you go to the basement level for lab work before leaving today. Press "B" on the elevator. The lab is located at the first door on the left as you exit the elevator.  You will need a CXR today.  This will be done downstairs.  You have been scheduled for an endoscopy and colonoscopy. Please follow the written instructions given to you at your visit today. Please pick up your prep supplies at the pharmacy within the next 1-3 days. If you use inhalers (even only as needed), please bring them with you on the day of your procedure.  Due to recent changes in healthcare laws, you may see the results of your imaging and laboratory studies on MyChart before your provider has had a chance to review them.  We understand that in some cases there may be results that are confusing or concerning to you. Not all laboratory results come back in the same time frame and the provider may be waiting for multiple results in order to interpret others.  Please give  [place] 48 hours in order for your provider to thoroughly review all the results before contacting the office for clarification of your results.   Thank you for entrusting me with your care and choosing [facility].  Dr Ardis Hughs

## 2021-04-21 NOTE — Progress Notes (Signed)
Review of pertinent gastrointestinal problems: 1.  PMS 2 mutation positive Lynch syndrome:  Colonoscopy 2018 Dr. Ardis Hughs found several tubular adenomas and sessile serrated adenomas.  Colonoscopy May 2019 Dr. Ardis Hughs found 3 subcentimeter adenomas.  Colonoscopy Dr. Ardis Hughs 08/2020 with subcentimeter adenomas.  Colonoscopy October 2021 no polyps. EGD February 2018 small hiatal hernia.  Otherwise normal.  EGD October 2021 hiatal hernia otherwise normal. 2.  Complicated biliary disease presented December 2022 with gallstones and gallbladder, common bile duct stone.  Failed ERCP locally in [place].  Laparoscopic cholecystectomy in [place].  Eventual transfer to [facility] with successful ERCP for postoperative leak and gallstone and bile duct.  Repeat ERCP at [facility] January 2023 to remove the previous bile duct stent.  HPI: This is a very pleasant 51 year old woman whom I last saw October 2021 for EGD and colonoscopy.  She was results summarized above.  She had quite a medically adventurous past month or 2 with gallstone related problems.  She had her second ERCP at [place] 3 days ago with removal of the previously placed biliary stent for bile duct leak and choledocholithiasis.  She had a subsequent upper respiratory problem going into that with coughing fatigue and that has gotten worse since her ERCP 2 days ago.  She is having no fevers but she does have an intermittently productive deep cough.  She thinks she might have pneumonia.  She is still bothered by left-sided abdominal pains.  There was some possibility that this could be relieved with her gallbladder surgery however she has had 3 discrete events since then so obviously her left-sided abdominal pains were not related to her gallbladder problems or choledocholithiasis.  She is having no bowel issues.  She has been losing weight over the past month or 2   ROS: complete GI ROS as described in HPI, all other review negative.  Constitutional:  No  unintentional weight loss   Past Medical History:  Diagnosis Date   Allergy    Anxiety    Asthma    Back pain    Cancer ([place]) 2018   Left Breast   COVID-19 virus infection 11/2019   Depression    Dyspnea    Family history of breast cancer    Family history of melanoma    Family history of ovarian cancer    Gallstones    GERD (gastroesophageal reflux disease)    IBS (irritable bowel syndrome)    Lower extremity edema    Lynch syndrome    Lynch syndrome    Migraine    Obesity    Ovarian cyst    Vitamin D deficiency     Past Surgical History:  Procedure Laterality Date   ANKLE FRACTURE SURGERY Right 12/2018   BREAST RECONSTRUCTION WITH PLACEMENT OF TISSUE EXPANDER AND FLEX HD (ACELLULAR HYDRATED DERMIS) Bilateral 03/31/2016   Procedure: BILATERAL BREAST RECONSTRUCTION WITH PLACEMENT OF TISSUE EXPANDER AND FLEX HD (ACELLULAR HYDRATED DERMIS);  Surgeon: Wallace Going, DO;  Location: [facility];  Service: Plastics;  Laterality: Bilateral;   CESAREAN SECTION  2005   CHOLECYSTECTOMY N/A 03/13/2021   Procedure: LAPAROSCOPIC CHOLECYSTECTOMY WITH  INTRAOPERATIVE CHOLANGIOGRAM;  Surgeon: Erroll Luna, MD;  Location: [facility];  Service: General;  Laterality: N/A;   COLONOSCOPY     ERCP N/A 03/12/2021   Procedure: ENDOSCOPIC RETROGRADE CHOLANGIOPANCREATOGRAPHY (ERCP);  Surgeon: Irving Copas., MD;  Location: [facility];  Service: Gastroenterology;  Laterality: N/A;   IR PERCUTANEOUS TRANSHEPATIC CHOLANGIOGRAM  03/15/2021   LAPAROSCOPIC VAGINAL HYSTERECTOMY WITH SALPINGO  OOPHORECTOMY Bilateral 09/15/2016   Procedure: LAPAROSCOPIC ASSISTED VAGINAL HYSTERECTOMY WITH BILATERAL SALPINGO OOPHORECTOMY;  Surgeon: Molli Posey, MD;  Location: [facility];  Service: Gynecology;  Laterality: Bilateral;   LIPOSUCTION Bilateral 06/30/2016   Procedure: LIPOSUCTION TO LATERAL CHEST;  Surgeon: Wallace Going, DO;  Location: [facility];  Service:  Plastics;  Laterality: Bilateral;   NIPPLE SPARING MASTECTOMY Bilateral 03/31/2016   Procedure: BILATERAL NIPPLE SPARING MASTECTOMIES;  Surgeon: Autumn Messing III, MD;  Location: [facility];  Service: General;  Laterality: Bilateral;   PANCREATIC STENT PLACEMENT  03/12/2021   Procedure: PANCREATIC STENT PLACEMENT;  Surgeon: Irving Copas., MD;  [facility]: [facility];  Service: Gastroenterology;;   POLYPECTOMY     REMOVAL OF BILATERAL TISSUE EXPANDERS WITH PLACEMENT OF BILATERAL BREAST IMPLANTS Bilateral 06/30/2016   Procedure: REMOVAL OF BILATERAL TISSUE EXPANDERS WITH PLACEMENT OF BILATERAL SILCONE BREAST IMPLANTS;  Surgeon: Wallace Going, DO;  Location: [facility];  Service: Plastics;  Laterality: Bilateral;   SPHINCTEROTOMY  03/12/2021   Procedure: SPHINCTEROTOMY;  Surgeon: Irving Copas., MD;  Location: [facility];  Service: Gastroenterology;;  needle knife   WISDOM TOOTH EXTRACTION      Current Outpatient Medications  Medication Sig Dispense Refill   Doxylamine-DM (VICKS DAYQUIL/NYQUIL COUGH PO) Take 30 mLs by mouth every 4 (four) hours as needed (cough).     famotidine (PEPCID) 40 MG tablet Take 1 tablet (40 mg total) by mouth daily. 90 tablet 1   hyoscyamine (LEVSIN SL) 0.125 MG SL tablet Place 1 tablet (0.125 mg total) under the tongue every 6 (six) hours as needed. 30 tablet 1   ondansetron (ZOFRAN-ODT) 8 MG disintegrating tablet Dissolve one tablet on the tongue every 8 hours as needed for nausea and vomiting. (Patient taking differently: Take 8 mg by mouth every 8 (eight) hours as needed for nausea or vomiting.) 30 tablet 1   promethazine (PHENERGAN) 12.5 MG tablet Take 1 tablet (12.5 mg total) by mouth 2 (two) times daily as needed for nausea or vomiting. 30 tablet 1   No current facility-administered medications for this visit.    Allergies as of 04/21/2021 - Review Complete 04/21/2021  Allergen Reaction Noted   Bee venom  Anaphylaxis 09/30/2015   Wellbutrin [bupropion] Anaphylaxis 06/12/2013    Family History  Problem Relation Age of Onset   Breast cancer Mother 42   Liver cancer Mother    Alcoholism Mother    Lung cancer Father 52   Breast cancer Sister 23   Breast cancer Maternal Grandmother    Ovarian cancer Maternal Grandmother    Colon cancer Maternal Grandmother    Cancer Paternal Grandfather        NOS   Melanoma Maternal Aunt        mother's maternal half sister   Colon polyps Neg Hx    Rectal cancer Neg Hx    Stomach cancer Neg Hx    Esophageal cancer Neg Hx     Social History   Socioeconomic History   Marital status: Married    Spouse name: Jamaica Inthavong   Number of children: 3   Years of education: Not on file   Highest education level: Not on file  Occupational History   Occupation: Sales  Tobacco Use   Smoking status: Every Day    Packs/day: 0.50    Years: 20.00    Pack years: 10.00    Types: Cigarettes    Last attempt to quit: 12/20/2015    Years  since quitting: 5.3   Smokeless tobacco: Never   Tobacco comments:    form given 02-6016  Vaping Use   Vaping Use: Never used  Substance and Sexual Activity   Alcohol use: Yes    Comment: occ   Drug use: No   Sexual activity: Yes    Birth control/protection: I.U.D., Post-menopausal    Comment: IUD REMOVED 09/07/16  Other Topics Concern   Not on file  Social History Narrative   Not on file   Social Determinants of Health   Financial Resource Strain: Not on file  Food Insecurity: Not on file  Transportation Needs: Not on file  Physical Activity: Not on file  Stress: Not on file  Social Connections: Not on file  Intimate Partner Violence: Not on file     Physical Exam: BP 130/80    Pulse 94    Ht 5\' 2"  (1.575 m)    Wt 252 lb (114.3 kg)    LMP 09/07/2016 Comment: IUD REMOVED 09/07/16   BMI 46.09 kg/m  Constitutional: generally well-appearing Psychiatric: alert and oriented x3 Lungs, expiratory wheezing  throughout Abdomen: soft, nontender, nondistended, no obvious ascites, no peritoneal signs, normal bowel sounds No peripheral edema noted in lower extremities  Assessment and plan: 51 y.o. female with recent complicated biliary disease, deep and intermittent productive cough, Lynch syndrome, nausea  She had a cough going into her ERCP 2 days ago at [facility] and that is worse since her procedure.  I do have some concern that she might have a significant upper respiratory tract infection or perhaps pneumonia and I am getting a chest x-ray PA and lateral as well as a CBC and complete metabolic profile today.  Her intermittent left upper quadrant pains have returned despite treatment of her complicated biliary disease and so obviously they were not related to her biliary disease.  Unclear etiology.  She is due for Lynch syndrome screening with colonoscopy and EGD and we will plan for those to be done in 3 or 4 weeks from now to allow her to get a bit more recovered from what seems like an upper respiratory tract infection.  Please see the "Patient Instructions" section for addition details about the plan.  Owens Loffler, MD [facility] 04/21/2021, 8:34 AM   Total time on date of encounter was 30 minutes (this included time spent preparing to see the patient reviewing records; obtaining and/or reviewing separately obtained history; performing a medically appropriate exam and/or evaluation; counseling and educating the patient and family if present; ordering medications, tests or procedures if applicable; and documenting clinical information in the health record).

## 2021-05-28 ENCOUNTER — Encounter: Payer: Self-pay | Admitting: Gastroenterology

## 2021-06-02 ENCOUNTER — Encounter: Payer: Self-pay | Admitting: Gastroenterology

## 2021-06-02 ENCOUNTER — Other Ambulatory Visit: Payer: Self-pay

## 2021-06-02 ENCOUNTER — Ambulatory Visit (AMBULATORY_SURGERY_CENTER): Payer: BC Managed Care – PPO | Admitting: Gastroenterology

## 2021-06-02 ENCOUNTER — Other Ambulatory Visit: Payer: Self-pay | Admitting: Gastroenterology

## 2021-06-02 VITALS — BP 159/79 | HR 81 | Temp 98.0°F | Resp 16 | Ht 62.0 in | Wt 252.0 lb

## 2021-06-02 DIAGNOSIS — Z1509 Genetic susceptibility to other malignant neoplasm: Secondary | ICD-10-CM

## 2021-06-02 DIAGNOSIS — K449 Diaphragmatic hernia without obstruction or gangrene: Secondary | ICD-10-CM

## 2021-06-02 DIAGNOSIS — R1030 Lower abdominal pain, unspecified: Secondary | ICD-10-CM | POA: Diagnosis not present

## 2021-06-02 DIAGNOSIS — Z86010 Personal history of colonic polyps: Secondary | ICD-10-CM | POA: Diagnosis not present

## 2021-06-02 DIAGNOSIS — K2970 Gastritis, unspecified, without bleeding: Secondary | ICD-10-CM | POA: Diagnosis not present

## 2021-06-02 DIAGNOSIS — R110 Nausea: Secondary | ICD-10-CM | POA: Diagnosis not present

## 2021-06-02 DIAGNOSIS — K319 Disease of stomach and duodenum, unspecified: Secondary | ICD-10-CM | POA: Diagnosis not present

## 2021-06-02 MED ORDER — SODIUM CHLORIDE 0.9 % IV SOLN: 500.0000 mL | Freq: Once | INTRAVENOUS | Status: DC

## 2021-06-02 NOTE — Patient Instructions (Addendum)
Handout on diverticulosis and hiatal hernia  provided  ? ?Await pathology results.  ? ?Continue current medications.  ? ?Repeat colonoscopy  in 1 year. ? ?YOU HAD AN ENDOSCOPIC PROCEDURE TODAY AT [facility]:   Refer to the procedure report that was given to you for any specific questions about what was found during the examination.  If the procedure report does not answer your questions, please call your gastroenterologist to clarify.  If you requested that your care partner not be given the details of your procedure findings, then the procedure report has been included in a sealed envelope for you to review at your convenience later. ? ?YOU SHOULD EXPECT: Some feelings of bloating in the abdomen. Passage of more gas than usual.  Walking can help get rid of the air that was put into your GI tract during the procedure and reduce the bloating. If you had a lower endoscopy (such as a colonoscopy or flexible sigmoidoscopy) you may notice spotting of blood in your stool or on the toilet paper. If you underwent a bowel prep for your procedure, you may not have a normal bowel movement for a few days. ? ?Please Note:  You might notice some irritation and congestion in your nose or some drainage.  This is from the oxygen used during your procedure.  There is no need for concern and it should clear up in a day or so. ? ?SYMPTOMS TO REPORT IMMEDIATELY: ? ?Following lower endoscopy (colonoscopy or flexible sigmoidoscopy): ? Excessive amounts of blood in the stool ? Significant tenderness or worsening of abdominal pains ? Swelling of the abdomen that is new, acute ? Fever of 100?F or higher ? ?Following upper endoscopy (EGD) ? Vomiting of blood or coffee ground material ? New chest pain or pain under the shoulder blades ? Painful or persistently difficult swallowing ? New shortness of breath ? Fever of 100?F or higher ? Black, tarry-looking stools ? ?For urgent or emergent issues, a gastroenterologist can be  reached at any hour by calling [phone]. ?Do not use MyChart messaging for urgent concerns.  ? ? ?DIET:  We do recommend a small meal at first, but then you may proceed to your regular diet.  Drink plenty of fluids but you should avoid alcoholic beverages for 24 hours. ? ?ACTIVITY:  You should plan to take it easy for the rest of today and you should NOT DRIVE or use heavy machinery until tomorrow (because of the sedation medicines used during the test).   ? ?FOLLOW UP: ?Our staff will call the number listed on your records 48-72 hours following your procedure to check on you and address any questions or concerns that you may have regarding the information given to you following your procedure. If we do not reach you, we will leave a message.  We will attempt to reach you two times.  During this call, we will ask if you have developed any symptoms of COVID 19. If you develop any symptoms (ie: fever, flu-like symptoms, shortness of breath, cough etc.) before then, please call [phone].  If you test positive for Covid 19 in the 2 weeks post procedure, please call and report this information to [place].   ? ?If any biopsies were taken you will be contacted by phone or by letter within the next 1-3 weeks.  Please call us at [phone] if you have not heard about the biopsies in 3 weeks.  ? ? ?SIGNATURES/CONFIDENTIALITY: ?You and/or your care partner  have signed paperwork which will be entered into your electronic medical record.  These signatures attest to the fact that that the information above on your After Visit Summary has been reviewed and is understood.  Full responsibility of the confidentiality of this discharge information lies with you and/or your care-partner. ? ? ?

## 2021-06-02 NOTE — Progress Notes (Signed)
Called to room to assist during endoscopic procedure.  Patient ID and intended procedure confirmed with present staff. Received instructions for my participation in the procedure from the performing physician.  

## 2021-06-02 NOTE — Progress Notes (Signed)
1.  PMS 2 mutation positive Lynch syndrome:  ?Colonoscopy 2018 Dr. Ardis Hughs found several tubular adenomas and sessile serrated adenomas.  Colonoscopy May 2019 Dr. Ardis Hughs found 3 subcentimeter adenomas.  Colonoscopy Dr. Ardis Hughs 08/2020 with subcentimeter adenomas.  Colonoscopy October 2021 no polyps. ?EGD February 2018 small hiatal hernia.  Otherwise normal.  EGD October 2021 hiatal hernia otherwise normal. ? ?HPI: ?This is a woman with Lynch Syndrome,left sided abd pains, nausea ? ? ?ROS: complete GI ROS as described in HPI, all other review negative. ? ?Constitutional:  No unintentional weight loss ? ? ?Past Medical History:  ?Diagnosis Date  ? Allergy   ? Anxiety   ? Asthma   ? Back pain   ? Cancer [facility]) 2018  ? Left Breast  ? COVID-19 virus infection 11/2019  ? Depression   ? Dyspnea   ? Family history of breast cancer   ? Family history of melanoma   ? Family history of ovarian cancer   ? Gallstones   ? GERD (gastroesophageal reflux disease)   ? IBS (irritable bowel syndrome)   ? Lower extremity edema   ? Lynch syndrome   ? Lynch syndrome   ? Migraine   ? Obesity   ? Ovarian cyst   ? Vitamin D deficiency   ? ? ?Past Surgical History:  ?Procedure Laterality Date  ? ANKLE FRACTURE SURGERY Right 12/2018  ? BREAST RECONSTRUCTION WITH PLACEMENT OF TISSUE EXPANDER AND FLEX HD (ACELLULAR HYDRATED DERMIS) Bilateral 03/31/2016  ? Procedure: BILATERAL BREAST RECONSTRUCTION WITH PLACEMENT OF TISSUE EXPANDER AND FLEX HD (ACELLULAR HYDRATED DERMIS);  Surgeon: Wallace Going, DO;  Location: [facility];  Service: Plastics;  Laterality: Bilateral;  ? CESAREAN SECTION  2005  ? CHOLECYSTECTOMY N/A 03/13/2021  ? Procedure: LAPAROSCOPIC CHOLECYSTECTOMY WITH  INTRAOPERATIVE CHOLANGIOGRAM;  Surgeon: Erroll Luna, MD;  [facility]: [facility];  Service: General;  Laterality: N/A;  ? COLONOSCOPY    ? ERCP N/A 03/12/2021  ? Procedure: ENDOSCOPIC RETROGRADE CHOLANGIOPANCREATOGRAPHY (ERCP);  Surgeon: Irving Copas.,  MD;  Location: [facility];  Service: Gastroenterology;  Laterality: N/A;  ? IR PERCUTANEOUS TRANSHEPATIC CHOLANGIOGRAM  03/15/2021  ? LAPAROSCOPIC VAGINAL HYSTERECTOMY WITH SALPINGO OOPHORECTOMY Bilateral 09/15/2016  ? Procedure: LAPAROSCOPIC ASSISTED VAGINAL HYSTERECTOMY WITH BILATERAL SALPINGO OOPHORECTOMY;  Surgeon: Molli Posey, MD;  Location: [facility];  Service: Gynecology;  Laterality: Bilateral;  ? LIPOSUCTION Bilateral 06/30/2016  ? Procedure: LIPOSUCTION TO LATERAL CHEST;  Surgeon: Wallace Going, DO;  Location: [facility];  Service: Plastics;  Laterality: Bilateral;  ? NIPPLE SPARING MASTECTOMY Bilateral 03/31/2016  ? Procedure: BILATERAL NIPPLE SPARING MASTECTOMIES;  Surgeon: Autumn Messing III, MD;  Location: [facility];  Service: General;  Laterality: Bilateral;  ? PANCREATIC STENT PLACEMENT  03/12/2021  ? Procedure: PANCREATIC STENT PLACEMENT;  Surgeon: Rush Landmark Telford Nab., MD;  Location: [facility];  Service: Gastroenterology;;  ? POLYPECTOMY    ? REMOVAL OF BILATERAL TISSUE EXPANDERS WITH PLACEMENT OF BILATERAL BREAST IMPLANTS Bilateral 06/30/2016  ? Procedure: REMOVAL OF BILATERAL TISSUE EXPANDERS WITH PLACEMENT OF BILATERAL SILCONE BREAST IMPLANTS;  Surgeon: Wallace Going, DO;  Location: [facility];  Service: Plastics;  Laterality: Bilateral;  ? SPHINCTEROTOMY  03/12/2021  ? Procedure: SPHINCTEROTOMY;  Surgeon: Mansouraty, Telford Nab., MD;  Location: [facility];  Service: Gastroenterology;;  needle knife  ? WISDOM TOOTH EXTRACTION    ? ? ?Current Outpatient Medications  ?Medication Sig Dispense Refill  ? Doxylamine-DM (VICKS DAYQUIL/NYQUIL COUGH PO) Take 30 mLs by mouth every 4 (  four) hours as needed (cough).    ? famotidine (PEPCID) 40 MG tablet Take 1 tablet (40 mg total) by mouth daily. 90 tablet 1  ? hyoscyamine (LEVSIN SL) 0.125 MG SL tablet Place 1 tablet (0.125 mg total) under the tongue every 6 (six) hours as needed. 30 tablet 1  ?  ondansetron (ZOFRAN-ODT) 8 MG disintegrating tablet Dissolve one tablet on the tongue every 8 hours as needed for nausea and vomiting. (Patient taking differently: Take 8 mg by mouth every 8 (eight) hours as needed for nausea or vomiting.) 30 tablet 1  ? promethazine (PHENERGAN) 12.5 MG tablet Take 1 tablet (12.5 mg total) by mouth 2 (two) times daily as needed for nausea or vomiting. 30 tablet 1  ? ?Current Facility-Administered Medications  ?Medication Dose Route Frequency Provider Last Rate Last Admin  ? 0.9 %  sodium chloride infusion  500 mL Intravenous Once Milus Banister, MD      ? ? ?Allergies as of 06/02/2021 - Review Complete 06/02/2021  ?Allergen Reaction Noted  ? Bee venom Anaphylaxis 09/30/2015  ? Wellbutrin [bupropion] Anaphylaxis 06/12/2013  ? ? ?Family History  ?Problem Relation Age of Onset  ? Breast cancer Mother 51  ? Liver cancer Mother   ? Alcoholism Mother   ? Lung cancer Father 8  ? Breast cancer Sister 22  ? Breast cancer Maternal Grandmother   ? Ovarian cancer Maternal Grandmother   ? Colon cancer Maternal Grandmother   ? Cancer Paternal Grandfather   ?     NOS  ? Melanoma Maternal Aunt   ?     mother's maternal half sister  ? Colon polyps Neg Hx   ? Rectal cancer Neg Hx   ? Stomach cancer Neg Hx   ? Esophageal cancer Neg Hx   ? ? ?Social History  ? ?Socioeconomic History  ? Marital status: Married  ?  Spouse name: Lilianna Case  ? Number of children: 3  ? Years of education: Not on file  ? Highest education level: Not on file  ?Occupational History  ? Occupation: Sales  ?Tobacco Use  ? Smoking status: Every Day  ?  Packs/day: 0.50  ?  Years: 20.00  ?  Pack years: 10.00  ?  Types: Cigarettes  ?  Last attempt to quit: 12/20/2015  ?  Years since quitting: 5.4  ? Smokeless tobacco: Never  ? Tobacco comments:  ?  form given 02-6016  ?Vaping Use  ? Vaping Use: Never used  ?Substance and Sexual Activity  ? Alcohol use: Yes  ?  Comment: occ  ? Drug use: No  ? Sexual activity: Yes  ?  Birth  control/protection: I.U.D., Post-menopausal  ?  Comment: IUD REMOVED 09/07/16  ?Other Topics Concern  ? Not on file  ?Social History Narrative  ? Not on file  ? ?Social Determinants of Health  ? ?Financial Resource Strain: Not on file  ?Food Insecurity: Not on file  ?Transportation Needs: Not on file  ?Physical Activity: Not on file  ?Stress: Not on file  ?Social Connections: Not on file  ?Intimate Partner Violence: Not on file  ? ? ? ?Physical Exam: ?BP 127/70   Pulse 84   Temp 98 ?F (36.7 ?C)   Ht '5\' 2"'$  (1.575 m)   Wt 252 lb (114.3 kg)   LMP 09/07/2016 Comment: IUD REMOVED 09/07/16  SpO2 95%   BMI 46.09 kg/m?  ?Constitutional: generally well-appearing ?Psychiatric: alert and oriented x3 ?Lungs: CTA bilaterally ?Heart: no MCR ? ?Assessment  and plan: ?51 y.o. female with Lynch Syndrome,left sided abd pains, nausea ? ?Colonoscopy and EGD today ? ?Care is appropriate for the ambulatory setting. ? ?Owens Loffler, MD ?[facility] ?06/02/2021, 12:56 PM ? ? ? ?

## 2021-06-02 NOTE — Progress Notes (Signed)
Pt in recovery with monitors in place, VSS. Report given to receiving RN. Bite guard was placed with pt awake to ensure comfort. No dental or soft tissue damage noted. 

## 2021-06-02 NOTE — Op Note (Signed)
[place] ?Patient Name: Lynn Simpson ?Procedure Date: 06/02/2021 1:31 PM ?MRN: [id number] ?Endoscopist: Milus Banister , MD ?Age: 51 ?Referring MD:  ?Date of Birth: 1970/06/22 ?Gender: Female ?Account #: [id number] ?Procedure:                Colonoscopy ?Indications:              High risk colon cancer surveillance: PMS 2 mutation  ?                          positive Lynch syndrome: Colonoscopy 2018 Dr.  Ardis Hughs found several tubular adenomas and sessile  ?                          serrated adenomas. Colonoscopy May 2019 Dr. Ardis Hughs  ?                          found 3 subcentimeter adenomas. Colonoscopy Dr.  Ardis Hughs 08/2018 with subcentimeter adenomas.  ?                          Colonoscopy 12/2019 no polyps. ?Medicines:                Monitored Anesthesia Care ?Procedure:                Pre-Anesthesia Assessment: ?                          - Prior to the procedure, a History and Physical  ?                          was performed, and patient medications and  ?                          allergies were reviewed. The patient's tolerance of  ?                          previous anesthesia was also reviewed. The risks  ?                          and benefits of the procedure and the sedation  ?                          options and risks were discussed with the patient.  ?                          All questions were answered, and informed consent  ?                          was obtained. Prior Anticoagulants: The patient has  ?  taken no previous anticoagulant or antiplatelet  ?                          agents. ASA Grade Assessment: III - A patient with  ?                          severe systemic disease. After reviewing the risks  ?                          and benefits, the patient was deemed in  ?                          satisfactory condition to undergo the procedure. ?                          After obtaining informed consent, the  colonoscope  ?                          was passed under direct vision. Throughout the  ?                          procedure, the patient's blood pressure, pulse, and  ?                          oxygen saturations were monitored continuously. The  ?                          Olympus CF-[id number] (#[id number]) Colonoscope was  ?                          introduced through the anus and advanced to the the  ?                          cecum, identified by appendiceal orifice and  ?                          ileocecal valve. The colonoscopy was performed  ?                          without difficulty. The patient tolerated the  ?                          procedure well. The quality of the bowel  ?                          preparation was good. The ileocecal valve,  ?                          appendiceal orifice, and rectum were photographed. ?Scope In: 1:33:30 PM ?Scope Out: 1:47:23 PM ?Scope Withdrawal Time: 0 hours 10 minutes 21 seconds  ?Total Procedure Duration: 0 hours 13 minutes 53 seconds  ?Findings:                 Multiple small-mouthed diverticula were found in  ?  the entire colon. ?                          The exam was otherwise without abnormality on  ?                          direct and retroflexion views. ?Complications:            No immediate complications. Estimated blood loss:  ?                          None. ?Estimated Blood Loss:     Estimated blood loss: none. ?Impression:               - Diverticulosis in the entire examined colon. ?                          - The examination was otherwise normal on direct  ?                          and retroflexion views. ?                          - No polyps or cancers. ?Recommendation:           - EGD now ?                          - Repeat colonoscopy in 1 year ?Milus Banister, MD ?06/02/2021 1:50:27 PM ?This report has been signed electronically. ?

## 2021-06-02 NOTE — Op Note (Signed)
[facility] ?Patient Name: Lynn Simpson ?Procedure Date: 06/02/2021 1:31 PM ?MRN: [id number] ?Endoscopist: Milus Banister , MD ?Age: 51 ?Referring MD:  ?Date of Birth: 10-Dec-1970 ?Gender: Female ?Account #: [id number] ?Procedure:                Upper GI endoscopy ?Indications:              left sided abd pains, nausea; PMS2 mutation Lynch  ?                          Syndrome ?? EGD February 2018 small hiatal hernia.  ?                          Otherwise normal. EGD October 2021 hiatal hernia  ?                          otherwise normal. ?Medicines:                Monitored Anesthesia Care ?Procedure:                Pre-Anesthesia Assessment: ?                          - Prior to the procedure, a History and Physical  ?                          was performed, and patient medications and  ?                          allergies were reviewed. The patient's tolerance of  ?                          previous anesthesia was also reviewed. The risks  ?                          and benefits of the procedure and the sedation  ?                          options and risks were discussed with the patient.  ?                          All questions were answered, and informed consent  ?                          was obtained. Prior Anticoagulants: The patient has  ?                          taken no previous anticoagulant or antiplatelet  ?                          agents. ASA Grade Assessment: III - A patient with  ?                          severe systemic disease. After reviewing the risks  ?  and benefits, the patient was deemed in  ?                          satisfactory condition to undergo the procedure. ?                          After obtaining informed consent, the endoscope was  ?                          passed under direct vision. Throughout the  ?                          procedure, the patient's blood pressure, pulse, and  ?                          oxygen saturations were monitored  continuously. The  ?                          GIF [id number] #[id number] was introduced through the  ?                          mouth, and advanced to the second part of duodenum.  ?                          The upper GI endoscopy was accomplished without  ?                          difficulty. The patient tolerated the procedure  ?                          well. ?Scope In: ?Scope Out: ?Findings:                 Mild inflammation characterized by erythema,  ?                          friability and granularity was found in the gastric  ?                          antrum. Biopsies were taken with a cold forceps for  ?                          histology. ?                          A small hiatal hernia was present. ?                          The exam was otherwise without abnormality. ?Complications:            No immediate complications. Estimated blood loss:  ?                          None. ?Estimated Blood Loss:     Estimated blood loss: none. ?Impression:               -  Non-specific distal gastritis, biopsied to check  ?                          for H. pylori. ?                          - Small hiatal hernia. ?                          - The examination was otherwise normal. ?Recommendation:           - Patient has a contact number available for  ?                          emergencies. The signs and symptoms of potential  ?                          delayed complications were discussed with the  ?                          patient. Return to normal activities tomorrow.  ?                          Written discharge instructions were provided to the  ?                          patient. ?                          - Resume previous diet. ?                          - Continue present medications. ?                          - Await pathology results. ?Milus Banister, MD ?06/02/2021 1:58:16 PM ?This report has been signed electronically. ?

## 2021-06-04 ENCOUNTER — Telehealth: Payer: Self-pay

## 2021-06-04 NOTE — Telephone Encounter (Signed)
?  Follow up Call- ? ?Call back number 06/02/2021 01/01/2020  ?Post procedure Call Back phone  # [phone]  ?Permission to leave phone message Yes Yes  ?Some recent data might be hidden  ?  ? ?Patient questions: ? ?Do you have a fever, pain , or abdominal swelling? No. ?Pain Score  0 * ? ?Have you tolerated food without any problems? Yes.   ? ?Have you been able to return to your normal activities? Yes.   ? ?Do you have any questions about your discharge instructions: ?Diet   No. ?Medications  No. ?Follow up visit  No. ? ?Do you have questions or concerns about your Care? No. ? ?Actions: ?* If pain score is 4 or above: ?No action needed, pain <4. ? ? ?

## 2021-06-22 DIAGNOSIS — E7800 Pure hypercholesterolemia, unspecified: Secondary | ICD-10-CM | POA: Diagnosis not present

## 2021-06-22 DIAGNOSIS — R030 Elevated blood-pressure reading, without diagnosis of hypertension: Secondary | ICD-10-CM | POA: Diagnosis not present

## 2021-06-22 DIAGNOSIS — Z1339 Encounter for screening examination for other mental health and behavioral disorders: Secondary | ICD-10-CM | POA: Diagnosis not present

## 2021-06-22 DIAGNOSIS — Z1331 Encounter for screening for depression: Secondary | ICD-10-CM | POA: Diagnosis not present

## 2021-07-27 DIAGNOSIS — R051 Acute cough: Secondary | ICD-10-CM | POA: Diagnosis not present

## 2021-07-27 DIAGNOSIS — J069 Acute upper respiratory infection, unspecified: Secondary | ICD-10-CM | POA: Diagnosis not present

## 2021-10-27 ENCOUNTER — Encounter (INDEPENDENT_AMBULATORY_CARE_PROVIDER_SITE_OTHER): Payer: Self-pay

## 2021-12-14 DIAGNOSIS — E7800 Pure hypercholesterolemia, unspecified: Secondary | ICD-10-CM | POA: Diagnosis not present

## 2021-12-20 DIAGNOSIS — Z23 Encounter for immunization: Secondary | ICD-10-CM | POA: Diagnosis not present

## 2021-12-20 DIAGNOSIS — Z1331 Encounter for screening for depression: Secondary | ICD-10-CM | POA: Diagnosis not present

## 2021-12-20 DIAGNOSIS — R030 Elevated blood-pressure reading, without diagnosis of hypertension: Secondary | ICD-10-CM | POA: Diagnosis not present

## 2021-12-20 DIAGNOSIS — Z0000 Encounter for general adult medical examination without abnormal findings: Secondary | ICD-10-CM | POA: Diagnosis not present

## 2021-12-20 DIAGNOSIS — Z1339 Encounter for screening examination for other mental health and behavioral disorders: Secondary | ICD-10-CM | POA: Diagnosis not present

## 2022-02-28 DIAGNOSIS — Z6841 Body Mass Index (BMI) 40.0 and over, adult: Secondary | ICD-10-CM | POA: Diagnosis not present

## 2022-02-28 DIAGNOSIS — Z01419 Encounter for gynecological examination (general) (routine) without abnormal findings: Secondary | ICD-10-CM | POA: Diagnosis not present

## 2022-03-08 ENCOUNTER — Emergency Department (HOSPITAL_BASED_OUTPATIENT_CLINIC_OR_DEPARTMENT_OTHER)
Admission: EM | Admit: 2022-03-08 | Discharge: 2022-03-08 | Disposition: A | Discharge status: Home / Self Care | Payer: BC Managed Care – PPO | Attending: Emergency Medicine | Admitting: Emergency Medicine

## 2022-03-08 ENCOUNTER — Encounter (HOSPITAL_BASED_OUTPATIENT_CLINIC_OR_DEPARTMENT_OTHER): Payer: Self-pay | Admitting: Emergency Medicine

## 2022-03-08 ENCOUNTER — Emergency Department (HOSPITAL_BASED_OUTPATIENT_CLINIC_OR_DEPARTMENT_OTHER): Payer: BC Managed Care – PPO

## 2022-03-08 ENCOUNTER — Other Ambulatory Visit: Payer: Self-pay

## 2022-03-08 DIAGNOSIS — I700 Atherosclerosis of aorta: Secondary | ICD-10-CM | POA: Diagnosis not present

## 2022-03-08 DIAGNOSIS — R1030 Lower abdominal pain, unspecified: Secondary | ICD-10-CM | POA: Diagnosis not present

## 2022-03-08 DIAGNOSIS — R9431 Abnormal electrocardiogram [ECG] [EKG]: Secondary | ICD-10-CM | POA: Diagnosis not present

## 2022-03-08 DIAGNOSIS — I880 Nonspecific mesenteric lymphadenitis: Secondary | ICD-10-CM | POA: Diagnosis not present

## 2022-03-08 DIAGNOSIS — R109 Unspecified abdominal pain: Secondary | ICD-10-CM | POA: Diagnosis not present

## 2022-03-08 DIAGNOSIS — R1031 Right lower quadrant pain: Secondary | ICD-10-CM | POA: Diagnosis not present

## 2022-03-08 LAB — COMPREHENSIVE METABOLIC PANEL
ALT: 26 U/L (ref 0–44)
AST: 30 U/L (ref 15–41)
Albumin: 3.7 g/dL (ref 3.5–5.0)
Alkaline Phosphatase: 111 U/L (ref 38–126)
Anion gap: 6 (ref 5–15)
BUN: 14 mg/dL (ref 6–20)
CO2: 24 mmol/L (ref 22–32)
Calcium: 8.6 mg/dL — ABNORMAL LOW (ref 8.9–10.3)
Chloride: 108 mmol/L (ref 98–111)
Creatinine, Ser: 0.63 mg/dL (ref 0.44–1.00)
GFR, Estimated: >60 mL/min (ref >60)
Glucose, Bld: 107 mg/dL — ABNORMAL HIGH (ref 70–99)
Potassium: 4.2 mmol/L (ref 3.5–5.1)
Sodium: 138 mmol/L (ref 135–145)
Total Bilirubin: 0.2 mg/dL — ABNORMAL LOW (ref 0.3–1.2)
Total Protein: 6.7 g/dL (ref 6.5–8.1)

## 2022-03-08 LAB — CBC
HCT: 42.8 % (ref 36.0–46.0)
Hemoglobin: 14.4 g/dL (ref 12.0–15.0)
MCH: 30.1 pg (ref 26.0–34.0)
MCHC: 33.6 g/dL (ref 30.0–36.0)
MCV: 89.5 fL (ref 80.0–100.0)
Platelets: 186 10*3/uL (ref 150–400)
RBC: 4.78 MIL/uL (ref 3.87–5.11)
RDW: 11.9 % (ref 11.5–15.5)
WBC: 8.3 10*3/uL (ref 4.0–10.5)
nRBC: 0 % (ref 0.0–0.2)

## 2022-03-08 LAB — URINALYSIS, ROUTINE W REFLEX MICROSCOPIC
Bilirubin Urine: NEGATIVE
Glucose, UA: NEGATIVE mg/dL
Hgb urine dipstick: NEGATIVE
Ketones, ur: NEGATIVE mg/dL
Leukocytes,Ua: NEGATIVE
Nitrite: NEGATIVE
Protein, ur: NEGATIVE mg/dL
Specific Gravity, Urine: 1.025 (ref 1.005–1.030)
pH: 6 (ref 5.0–8.0)

## 2022-03-08 LAB — TROPONIN I (HIGH SENSITIVITY): Troponin I (High Sensitivity): <2 ng/L (ref <18)

## 2022-03-08 LAB — LIPASE, BLOOD: Lipase: 32 U/L (ref 11–51)

## 2022-03-08 LAB — D-DIMER, QUANTITATIVE: D-Dimer, Quant: 0.31 ug/mL-FEU (ref 0.00–0.50)

## 2022-03-08 MED ORDER — IOHEXOL 300 MG/ML  SOLN
100.0000 mL | Freq: Once | INTRAMUSCULAR | Status: AC | PRN
  Administered 2022-03-08: 100 mL via INTRAVENOUS

## 2022-03-08 MED ORDER — KETOROLAC TROMETHAMINE 15 MG/ML IJ SOLN
15.0000 mg | Freq: Once | INTRAMUSCULAR | Status: AC
  Administered 2022-03-08: 15 mg via INTRAVENOUS
  Filled 2022-03-08: qty 1

## 2022-03-08 NOTE — ED Provider Notes (Signed)
[facility] EMERGENCY DEPARTMENT Provider Note   CSN: [id number] Arrival date & time: 03/08/22  0444     History  Chief Complaint  Patient presents with   Abdominal Pain    Lynn Simpson is a 51 y.o. female.  Patient is a 51 year old female with a history of Lynch's syndrome status post hysterectomy, oophorectomy, mastectomy, prior cholecystectomy last year, GERD and back pain who is presenting today with complaints of back and abdominal pain.  Patient reports last Thursday which was 6 days prior to arrival she started having pain in the left side of her back.  By Friday it was in the right side of her back and over the weekend started traveling into her abdomen.  The pain is worse with taking a deep breath and certain movements.  She now does not have significant pain in her back at all seems to be more in her abdomen.  However she is not having any nausea, vomiting or change in her stools.  Last bowel movement was yesterday and normal.  No issues with urinating.  She denies any cough, congestion or fevers.  She did notice over the weekend she was feeling winded with exertion but denies resting shortness of breath.  She also has had no new chest pain.  She has constant pain in her her lower extremities but nothing that is different than her baseline.  She has not noticed any new leg swelling and denies any recent surgery or immobilization.  No prior history of clots.  She has seen cardiology in the past and has no known heart disease.  She denies any fevers.  The history is provided by the patient.  Abdominal Pain      Home Medications Prior to Admission medications   Medication Sig Start Date End Date Taking? Authorizing Provider  Doxylamine-DM (VICKS DAYQUIL/NYQUIL COUGH PO) Take 30 mLs by mouth every 4 (four) hours as needed (cough).    [provider]  famotidine (PEPCID) 40 MG tablet Take 1 tablet (40 mg total) by mouth daily. 02/03/21   Noralyn Pick, NP  hyoscyamine (LEVSIN SL) 0.125 MG SL tablet Place 1 tablet (0.125 mg total) under the tongue every 6 (six) hours as needed. 12/31/20   Noralyn Pick, NP  ondansetron (ZOFRAN-ODT) 8 MG disintegrating tablet Dissolve one tablet on the tongue every 8 hours as needed for nausea and vomiting. Patient taking differently: Take 8 mg by mouth every 8 (eight) hours as needed for nausea or vomiting. 02/03/21   Noralyn Pick, NP  promethazine (PHENERGAN) 12.5 MG tablet Take 1 tablet (12.5 mg total) by mouth 2 (two) times daily as needed for nausea or vomiting. 02/03/21   Noralyn Pick, NP      Allergies    Bee venom and Wellbutrin [bupropion]    Review of Systems   Review of Systems  Gastrointestinal:  Positive for abdominal pain.    Physical Exam Updated Vital Signs BP 125/65   Pulse 84   Temp 97.8 F (36.6 C)   Resp (!) 26   Ht '5\' 2"'$  (1.575 m)   Wt 119.7 kg   LMP 09/07/2016 Comment: IUD REMOVED 09/07/16  SpO2 100%   BMI 48.29 kg/m  Physical Exam Vitals and nursing note reviewed.  Constitutional:      General: She is not in acute distress.    Appearance: She is well-developed.  HENT:     Head: Normocephalic and atraumatic.  Eyes:     Pupils:  Pupils are equal, round, and reactive to light.  Cardiovascular:     Rate and Rhythm: Normal rate and regular rhythm.     Heart sounds: Normal heart sounds. No murmur heard.    No friction rub.  Pulmonary:     Effort: Pulmonary effort is normal.     Breath sounds: Normal breath sounds. No wheezing or rales.  Abdominal:     General: Bowel sounds are normal. There is no distension.     Palpations: Abdomen is soft.     Tenderness: There is abdominal tenderness in the right lower quadrant. There is right CVA tenderness. There is no guarding or rebound.  Musculoskeletal:        General: No tenderness. Normal range of motion.     Right lower leg: No edema.     Left lower leg: No edema.     Comments: No  edema  Skin:    General: Skin is warm and dry.     Findings: No rash.  Neurological:     Mental Status: She is alert and oriented to person, place, and time. Mental status is at baseline.     Cranial Nerves: No cranial nerve deficit.  Psychiatric:        Behavior: Behavior normal.     ED Results / Procedures / Treatments   Labs (all labs ordered are listed, but only abnormal results are displayed) Labs Reviewed  COMPREHENSIVE METABOLIC PANEL - Abnormal; Notable for the following components:      Result Value   Glucose, Bld 107 (*)    Calcium 8.6 (*)    Total Bilirubin 0.2 (*)    All other components within normal limits  URINALYSIS, ROUTINE W REFLEX MICROSCOPIC - Abnormal; Notable for the following components:   APPearance CLOUDY (*)    All other components within normal limits  LIPASE, BLOOD  CBC  D-DIMER, QUANTITATIVE  TROPONIN I (HIGH SENSITIVITY)    EKG EKG Interpretation  Date/Time:  Tuesday March 08 2022 08:04:15 EST Ventricular Rate:  72 PR Interval:  160 QRS Duration: 93 QT Interval:  404 QTC Calculation: 443 R Axis:   85 Text Interpretation: Sinus rhythm ST elev, probable normal early repol pattern No significant change since last tracing Confirmed by Blanchie Dessert [phone]) on 03/08/2022 8:39:27 AM  Radiology CT ABDOMEN PELVIS W CONTRAST  Result Date: 03/08/2022 CLINICAL DATA:  Abdominal pain for a few days. EXAM: CT ABDOMEN AND PELVIS WITH CONTRAST TECHNIQUE: Multidetector CT imaging of the abdomen and pelvis was performed using the standard protocol following bolus administration of intravenous contrast. RADIATION DOSE REDUCTION: This exam was performed according to the departmental dose-optimization program which includes automated exposure control, adjustment of the mA and/or kV according to patient size and/or use of iterative reconstruction technique. CONTRAST:  178m OMNIPAQUE IOHEXOL 300 MG/ML  SOLN COMPARISON:  CT scan 01/04/2021 FINDINGS: Lower  chest: The lung bases are clear of acute process. No pleural effusion or pulmonary lesions. The heart is normal in size. No pericardial effusion. Small hiatal hernia. Hepatobiliary: No hepatic lesions or intrahepatic biliary dilatation. Geographic areas of fatty infiltration noted. The portal and hepatic veins are patent. The gallbladder is surgically absent. No common bile duct dilatation. Pancreas: No mass, inflammation or ductal dilatation. Spleen: Normal size.  No focal lesions. Adrenals/Urinary Tract: The adrenal glands are normal. No worrisome renal lesions, renal calculi or hydronephrosis. The bladder is decompressed but grossly normal. Stomach/Bowel: The stomach, duodenum, small bowel and colon are unremarkable. Small duodenal diverticulum noted  near the pancreatic head. No acute inflammatory process, mass lesions or obstructive findings. The terminal ileum is normal. Appendix is normal. Scattered colonic diverticulosis but no findings for acute diverticulitis. Vascular/Lymphatic: Age advanced atherosclerotic calcifications involving the aorta and iliac arteries but no aneurysm or dissection. The branch vessels are patent. The major venous structures are patent. Circumaortic left renal vein is noted. Small scattered mesenteric and retroperitoneal lymph nodes. There is also interstitial changes in the root of the small bowel mesentery also seen on the prior study but more pronounced on today's study. Findings suggest benign mesenteric adenitis or panniculitis. Reproductive: Surgically absent. Other: No pelvic mass or adenopathy. No free pelvic fluid collections. No inguinal mass or adenopathy. No abdominal wall hernia or subcutaneous lesions. Musculoskeletal: No significant bony findings. IMPRESSION: 1. No acute abdominal/pelvic findings, mass lesions or adenopathy. 2. Mild mesenteritis or panniculitis. 3. Geographic areas of fatty infiltration of the liver. 4. Status post cholecystectomy. No biliary  dilatation. 5. Age advanced atherosclerotic calcifications involving the aorta and iliac arteries. Aortic Atherosclerosis (ICD10-I70.0). Electronically Signed   By: Marijo Sanes M.D.   On: 03/08/2022 10:20    Procedures Procedures    Medications Ordered in ED Medications  ketorolac (TORADOL) 15 MG/ML injection 15 mg (15 mg Intravenous Given 03/08/22 0802)  iohexol (OMNIPAQUE) 300 MG/ML solution 100 mL (100 mLs Intravenous Contrast Given 03/08/22 0959)    ED Course/ Medical Decision Making/ A&P                           Medical Decision Making Amount and/or Complexity of Data Reviewed Labs: ordered. Decision-making details documented in ED Course. Radiology: ordered and independent interpretation performed. Decision-making details documented in ED Course. ECG/medicine tests: ordered and independent interpretation performed. Decision-making details documented in ED Course.  Risk Prescription drug management.   Pt with multiple medical problems and comorbidities and presenting today with a complaint that caries a high risk for morbidity and mortality.  Here today with nonspecific back and abdominal pain.  Symptoms not classic for dissection, hepatitis, pancreatitis, perforation, small bowel obstruction or diverticulitis.  Patient has some right flank and some right-sided abdominal pain but no rebound or guarding.  No rashes to suggest shingles.  She does have a component of dyspnea on exertion but vital signs are reassuring.  She has no wheezing on exam.  I independently interpreted patient's EKG which is within normal limits and low suspicion for ACS as the symptoms of her pain.  Could be asthma related in addition to her pain.  I independently interpreted patient's labs and CBC, CMP, UA are all within normal limits, trop and d-dimer are normal.  No evidence of a UTI, transaminitis and lipase is normal.  Patient reports she had taken some leftover oxycodone and muscle relaxer at home which  did not help with her pain but a Goody[employer] powder did provide 4 hours of relief. Given her history and prior abdominal surgeries with low suspicion of cardiac or pulmonary process we will do a CT for further evaluation.  She is not having radicular symptoms consistent with a spinal process but might be muscular in nature.  10:56 AM I have independently visualized and interpreted pt's images today.  CT today shows no evidence of obstruction, masses or renal stones.  Radiology reports no acute abdominal pelvic findings masses lesions or adenopathy.  Mild mesenteric adenitis or panniculitis which potentially could be other cause of the patient's pain.  Findings discussed with  the patient and her husband who are present at bedside.  Low suspicion at this time for mesenteric ischemia.  Patient has pain control at home and will continue to use that and follow-up with [facility].  No further indication for ongoing testing.  No indication for admission at this time.          Final Clinical Impression(s) / ED Diagnoses Final diagnoses:  Mesenteric adenitis    Rx / DC Orders ED Discharge Orders     None         Blanchie Dessert, MD 03/08/22 1057

## 2022-03-08 NOTE — ED Triage Notes (Signed)
Pt is c/o right side abd pain that started on Sunday morning  Pt states the pain radiates into her back  Denies N/V/D  Last BM 8pm last night

## 2022-03-08 NOTE — Discharge Instructions (Signed)
.  All lab work looks normal and the CAT scan shows some mesenteric adenitis.  No signs of masses or blockages.  Continue current medications for pain.  Try a few days of 400-'600mg'$  ibuprofen but then discontinue and do not use for more than a few days.  If you develop vomiting, fever or change in symptoms return.

## 2022-03-08 NOTE — ED Notes (Signed)
ED Provider at bedside. 

## 2022-06-30 DIAGNOSIS — J449 Chronic obstructive pulmonary disease, unspecified: Secondary | ICD-10-CM | POA: Diagnosis not present

## 2022-06-30 DIAGNOSIS — Z1509 Genetic susceptibility to other malignant neoplasm: Secondary | ICD-10-CM | POA: Diagnosis not present

## 2022-06-30 DIAGNOSIS — R030 Elevated blood-pressure reading, without diagnosis of hypertension: Secondary | ICD-10-CM | POA: Diagnosis not present

## 2022-06-30 DIAGNOSIS — F4323 Adjustment disorder with mixed anxiety and depressed mood: Secondary | ICD-10-CM | POA: Diagnosis not present

## 2022-07-12 ENCOUNTER — Ambulatory Visit: Payer: BC Managed Care – PPO | Admitting: Gastroenterology

## 2022-10-05 ENCOUNTER — Telehealth: Payer: Self-pay | Admitting: Gastroenterology

## 2022-10-05 NOTE — Telephone Encounter (Signed)
Hi Dr. Meridee Score,     I contacted patient regarding your previous recommendation. Patient requested to see you in office first due to having nausea and vomiting that she is concerned about. She is scheduled for 10/12/2022.     Thank you.

## 2022-10-05 NOTE — Telephone Encounter (Signed)
OK. GM

## 2022-10-05 NOTE — Telephone Encounter (Signed)
Good morning Dr. Meridee Score,    This patient is requesting to continue her care with you. This patient is a former [employer] patient after being seen with [facility] in 2023. She was scheduled with you for an office visit because she was having symptoms in April 2024 and then cancelled. She stated that she was going to transfer her care back over to [facility] which is also stated in her recall for her colonoscopy. She now does not want to transfer her care to [facility] and would like to schedule office visit with you.    Can you please review and advise on scheduling? Thank you.

## 2022-10-05 NOTE — Telephone Encounter (Signed)
Patient has Lynch Syndrome. She is overdue for her screening. She can be scheduled for direct colonoscopy in the [facility]. She will not need EGD for at least 1-3 years, but can be decided in future. If she wants to be seen in clinic, then she can be scheduled with me or with one of the APPs and I can supervise. Let me know what she decides. Thanks. GM

## 2022-10-12 ENCOUNTER — Other Ambulatory Visit (INDEPENDENT_AMBULATORY_CARE_PROVIDER_SITE_OTHER): Payer: BC Managed Care – PPO

## 2022-10-12 ENCOUNTER — Encounter: Payer: Self-pay | Admitting: Gastroenterology

## 2022-10-12 ENCOUNTER — Ambulatory Visit: Payer: BC Managed Care – PPO | Admitting: Gastroenterology

## 2022-10-12 VITALS — BP 130/86 | HR 100 | Ht 62.0 in | Wt 268.4 lb

## 2022-10-12 DIAGNOSIS — R112 Nausea with vomiting, unspecified: Secondary | ICD-10-CM | POA: Diagnosis not present

## 2022-10-12 DIAGNOSIS — Z9889 Other specified postprocedural states: Secondary | ICD-10-CM | POA: Diagnosis not present

## 2022-10-12 DIAGNOSIS — G8929 Other chronic pain: Secondary | ICD-10-CM | POA: Insufficient documentation

## 2022-10-12 DIAGNOSIS — R6881 Early satiety: Secondary | ICD-10-CM

## 2022-10-12 DIAGNOSIS — Z1507 Genetic susceptibility to malignant neoplasm of urinary tract: Secondary | ICD-10-CM

## 2022-10-12 DIAGNOSIS — I880 Nonspecific mesenteric lymphadenitis: Secondary | ICD-10-CM

## 2022-10-12 DIAGNOSIS — Z1509 Genetic susceptibility to other malignant neoplasm: Secondary | ICD-10-CM | POA: Diagnosis not present

## 2022-10-12 DIAGNOSIS — K529 Noninfective gastroenteritis and colitis, unspecified: Secondary | ICD-10-CM | POA: Diagnosis not present

## 2022-10-12 DIAGNOSIS — R197 Diarrhea, unspecified: Secondary | ICD-10-CM | POA: Diagnosis not present

## 2022-10-12 DIAGNOSIS — R12 Heartburn: Secondary | ICD-10-CM

## 2022-10-12 DIAGNOSIS — Z9049 Acquired absence of other specified parts of digestive tract: Secondary | ICD-10-CM

## 2022-10-12 LAB — CBC
HCT: 44 % (ref 36.0–46.0)
Hemoglobin: 14.6 g/dL (ref 12.0–15.0)
MCHC: 33.2 g/dL (ref 30.0–36.0)
MCV: 91.1 fl (ref 78.0–100.0)
Platelets: 198 10*3/uL (ref 150.0–400.0)
RBC: 4.83 Mil/uL (ref 3.87–5.11)
RDW: 12.8 % (ref 11.5–15.5)
WBC: 9.3 10*3/uL (ref 4.0–10.5)

## 2022-10-12 LAB — COMPREHENSIVE METABOLIC PANEL
ALT: 18 U/L (ref 0–35)
AST: 18 U/L (ref 0–37)
Albumin: 4.3 g/dL (ref 3.5–5.2)
Alkaline Phosphatase: 101 U/L (ref 39–117)
BUN: 10 mg/dL (ref 6–23)
CO2: 28 mEq/L (ref 19–32)
Calcium: 9.1 mg/dL (ref 8.4–10.5)
Chloride: 104 mEq/L (ref 96–112)
Creatinine, Ser: 0.73 mg/dL (ref 0.40–1.20)
GFR: 96.05 mL/min (ref >60.00)
Glucose, Bld: 91 mg/dL (ref 70–99)
Potassium: 4 mEq/L (ref 3.5–5.1)
Sodium: 139 mEq/L (ref 135–145)
Total Bilirubin: 0.3 mg/dL (ref 0.2–1.2)
Total Protein: 6.8 g/dL (ref 6.0–8.3)

## 2022-10-12 LAB — C-REACTIVE PROTEIN: CRP: <1 mg/dL (ref 0.5–20.0)

## 2022-10-12 LAB — TSH: TSH: 2 u[IU]/mL (ref 0.35–5.50)

## 2022-10-12 LAB — AMYLASE: Amylase: 17 U/L — ABNORMAL LOW (ref 27–131)

## 2022-10-12 LAB — LIPASE: Lipase: 21 U/L (ref 11.0–59.0)

## 2022-10-12 LAB — SEDIMENTATION RATE: Sed Rate: 31 mm/hr — ABNORMAL HIGH (ref 0–30)

## 2022-10-12 MED ORDER — CHOLESTYRAMINE 4 G PO PACK: 4.0000 g | PACK | Freq: Two times a day (BID) | ORAL | 1 refills | Status: DC

## 2022-10-12 MED ORDER — RABEPRAZOLE SODIUM 20 MG PO TBEC: 20.0000 mg | DELAYED_RELEASE_TABLET | Freq: Every day | ORAL | 6 refills | Status: DC

## 2022-10-12 MED ORDER — NA SULFATE-K SULFATE-MG SULF 17.5-3.13-1.6 GM/177ML PO SOLN: 1.0000 | ORAL | 0 refills | Status: DC

## 2022-10-12 NOTE — Progress Notes (Signed)
GASTROENTEROLOGY OUTPATIENT CLINIC VISIT   Primary Care Provider Tisovec, Adelfa Koh, MD [address] [place] [address] [phone]  Patient Profile: Lynn Simpson is a 52 y.o. female with a pmh significant for Lynch syndrome, prior breast cancer, anxiety, MDD, migraines, IBS-D, GERD, status postcholecystectomy (previous choledocholithiasis and ERCPs for removal).  The patient presents to the [facility] for an evaluation and management of problem(s) noted below:  Problem List 1. Lynch syndrome   2. Abdominal pain, chronic, epigastric   3. Nausea and vomiting, unspecified vomiting type   4. Pyrosis   5. Early satiety   6. Chronic diarrhea   7. History of cholecystectomy   8. History of ERCP   9. Mesenteric adenitis     History of Present Illness Please see prior notes for full details of HPI.  Interval History This is the first time I see the patient in quite a few years.  She is a Dr. Christella Hartigan patient.  I met her for an attempt at ERCP a few years ago when she had choledocholithiasis.  She is overdue for her Lynch syndrome colonoscopy but as she was having some symptoms she wanted to come in today evaluated further.  She describes that in December when she was having significant lower abdominal discomfort she went to med center emergency department and ended up having imaging that showed evidence of mesenteritis/panniculitis.  That discomfort has slowly abated though comes and goes infrequently.  What is bit more significant for her currently is that she is having more episodes of upper abdominal pain with vomiting.  She is experiencing more significant acid reflux and is not on any current acid medications at this time.  This abdominal discomfort comes and goes.  However this is very similar and reminiscent to her symptoms of her gallbladder even though that has been gone.  She vomits at night at least 2 times a week.  She does have some early satiety.  She  has been gaining weight.  She has at least 8-10 bowel movements that are loose and runny on a daily basis.  She thinks this is similar if not slightly worse since her gallbladder returned.  She cannot recall previous biopsies of the colon being performed.  She just wants to feel better and hopeful that we can help her with that.  She does not take significant nonsteroidals or BC/Goody powders.  GI Review of Systems Positive as above Negative for dysphagia, odynophagia, melena, hematochezia  Review of Systems General: Denies fevers/chills/weight loss unintentionally Cardiovascular: Denies chest pain Pulmonary: Denies shortness of breath Gastroenterological: See HPI Genitourinary: Denies darkened urine  Hematological: Denies easy bruising/bleeding Endocrine: Denies temperature intolerance Dermatological: Denies jaundice Psychological: Mood is stable   Medications Current Outpatient Medications  Medication Sig Dispense Refill   cholestyramine (QUESTRAN) 4 g packet Take 1 packet (4 g total) by mouth 2 (two) times daily. 60 each 1   ibuprofen (ADVIL) 200 MG tablet Take 200-600 mg by mouth as needed.     Na Sulfate-K Sulfate-Mg Sulf (SUPREP BOWEL PREP KIT) 17.5-3.13-1.6 GM/177ML SOLN Take 1 kit by mouth as directed. For colonoscopy prep 354 mL 0   RABEprazole (ACIPHEX) 20 MG tablet Take 1 tablet (20 mg total) by mouth daily with breakfast. 30 tablet 6   famotidine (PEPCID) 40 MG tablet Take 1 tablet (40 mg total) by mouth daily. (Patient not taking: Reported on 10/12/2022) 90 tablet 1   hyoscyamine (LEVSIN SL) 0.125 MG SL tablet Place 1 tablet (0.125  mg total) under the tongue every 6 (six) hours as needed. (Patient not taking: Reported on 10/12/2022) 30 tablet 1   ondansetron (ZOFRAN-ODT) 8 MG disintegrating tablet Dissolve one tablet on the tongue every 8 hours as needed for nausea and vomiting. (Patient not taking: Reported on 10/12/2022) 30 tablet 1   promethazine (PHENERGAN) 12.5 MG tablet  Take 1 tablet (12.5 mg total) by mouth 2 (two) times daily as needed for nausea or vomiting. (Patient not taking: Reported on 10/12/2022) 30 tablet 1   No current facility-administered medications for this visit.    Allergies Allergies  Allergen Reactions   Bee Venom Anaphylaxis   Wellbutrin [Bupropion] Anaphylaxis    Histories Past Medical History:  Diagnosis Date   Allergy    Anxiety    Asthma    Back pain    Cancer (HCC) 2018   Left Breast   COVID-19 virus infection 11/2019   Depression    Dyspnea    Gallstones    GERD (gastroesophageal reflux disease)    IBS (irritable bowel syndrome)    Lower extremity edema    Lynch syndrome    Migraine    Obesity    Ovarian cyst    Vitamin D deficiency    Past Surgical History:  Procedure Laterality Date   ANKLE FRACTURE SURGERY Right 12/2018   BREAST RECONSTRUCTION WITH PLACEMENT OF TISSUE EXPANDER AND FLEX HD (ACELLULAR HYDRATED DERMIS) Bilateral 03/31/2016   Procedure: BILATERAL BREAST RECONSTRUCTION WITH PLACEMENT OF TISSUE EXPANDER AND FLEX HD (ACELLULAR HYDRATED DERMIS);  Surgeon: Peggye Form, DO;  Location: [facility];  Service: Plastics;  Laterality: Bilateral;   CESAREAN SECTION  2005   CHOLECYSTECTOMY N/A 03/13/2021   Procedure: LAPAROSCOPIC CHOLECYSTECTOMY WITH  INTRAOPERATIVE CHOLANGIOGRAM;  Surgeon: Harriette Bouillon, MD;  Location: [facility];  Service: General;  Laterality: N/A;   COLONOSCOPY     ERCP N/A 03/12/2021   Procedure: ENDOSCOPIC RETROGRADE CHOLANGIOPANCREATOGRAPHY (ERCP);  Surgeon: Lemar Lofty., MD;  Location: [facility];  Service: Gastroenterology;  Laterality: N/A;   IR PERCUTANEOUS TRANSHEPATIC CHOLANGIOGRAM  03/15/2021   LAPAROSCOPIC VAGINAL HYSTERECTOMY WITH SALPINGO OOPHORECTOMY Bilateral 09/15/2016   Procedure: LAPAROSCOPIC ASSISTED VAGINAL HYSTERECTOMY WITH BILATERAL SALPINGO OOPHORECTOMY;  Surgeon: Richarda Overlie, MD;  Location: [facility];  Service: Gynecology;   Laterality: Bilateral;   LIPOSUCTION Bilateral 06/30/2016   Procedure: LIPOSUCTION TO LATERAL CHEST;  Surgeon: Peggye Form, DO;  Location: [facility];  Service: Plastics;  Laterality: Bilateral;   NIPPLE SPARING MASTECTOMY Bilateral 03/31/2016   Procedure: BILATERAL NIPPLE SPARING MASTECTOMIES;  Surgeon: Chevis Pretty III, MD;  Location: [facility];  Service: General;  Laterality: Bilateral;   PANCREATIC STENT PLACEMENT  03/12/2021   Procedure: PANCREATIC STENT PLACEMENT;  Surgeon: Lemar Lofty., MD;  Location: [facility];  Service: Gastroenterology;;   POLYPECTOMY     REMOVAL OF BILATERAL TISSUE EXPANDERS WITH PLACEMENT OF BILATERAL BREAST IMPLANTS Bilateral 06/30/2016   Procedure: REMOVAL OF BILATERAL TISSUE EXPANDERS WITH PLACEMENT OF BILATERAL SILCONE BREAST IMPLANTS;  Surgeon: Peggye Form, DO;  Location: [facility];  Service: Plastics;  Laterality: Bilateral;   SPHINCTEROTOMY  03/12/2021   Procedure: SPHINCTEROTOMY;  Surgeon: Mansouraty, Netty Starring., MD;  Location: [facility];  Service: Gastroenterology;;  needle knife   WISDOM TOOTH EXTRACTION     Social History   Socioeconomic History   Marital status: Married    Spouse name: Jamiesha Victoria   Number of children: 3   Years of education: Not on file  Highest education level: Not on file  Occupational History   Occupation: Sales  Tobacco Use   Smoking status: Every Day    Current packs/day: 0.00    Average packs/day: 0.3 packs/day for 20.0 years (5.0 ttl pk-yrs)    Types: Cigarettes    Start date: 12/20/1995    Last attempt to quit: 12/20/2015    Years since quitting: 6.8   Smokeless tobacco: Never   Tobacco comments:    form given [id number]  Vaping Use   Vaping status: Never Used  Substance and Sexual Activity   Alcohol use: Yes    Comment: occ   Drug use: No   Sexual activity: Yes    Birth control/protection: I.U.D., Post-menopausal    Comment: IUD REMOVED  09/07/16  Other Topics Concern   Not on file  Social History Narrative   Not on file   Social Determinants of Health   Financial Resource Strain: Low Risk  (03/25/2021)   Received from [facility], [facility]   Overall Financial Resource Strain (CARDIA)    Difficulty of Paying Living Expenses: Not hard at all  Food Insecurity: No Food Insecurity (03/25/2021)   Received from [facility], [facility]   Hunger Vital Sign    Worried About Running Out of Food in the Last Year: Never true    Ran Out of Food in the Last Year: Never true  Transportation Needs: No Transportation Needs (03/25/2021)   Received from [facility], [facility]   [facility] - Transportation    In the past 12 months, has lack of transportation kept you from medical appointments or from getting medications?: No    Lack of Transportation (Non-Medical): No  Physical Activity: Not on file  Stress: Not on file  Social Connections: Not on file  Intimate Partner Violence: Not on file   Family History  Problem Relation Age of Onset   Breast cancer Mother 29   Liver cancer Mother    Alcoholism Mother    Lung cancer Father 52   Breast cancer Sister 60   Melanoma Maternal Aunt        mother's maternal half sister   Breast cancer Maternal Grandmother    Ovarian cancer Maternal Grandmother    Colon cancer Maternal Grandmother    Cancer Paternal Grandfather        NOS   Colon polyps Neg Hx    Rectal cancer Neg Hx    Stomach cancer Neg Hx    Esophageal cancer Neg Hx    Inflammatory bowel disease Neg Hx    Pancreatic cancer Neg Hx    I have reviewed her medical, social, and family history in detail and updated the electronic medical record as necessary.    PHYSICAL EXAMINATION  BP 130/86 (BP Location: Left Arm, Patient Position: Sitting, Cuff Size: Large)   Pulse 100   Ht 5\' 2"  (1.575 m)   Wt 268 lb 6 oz (121.7  kg)   LMP 09/07/2016 Comment: IUD REMOVED 09/07/16  BMI 49.09 kg/m  Wt Readings from Last 3 Encounters:  10/12/22 268 lb 6 oz (121.7 kg)  03/08/22 264 lb (119.7 kg)  06/02/21 252 lb (114.3 kg)  GEN: NAD, appears stated age, doesn't appear chronically ill PSYCH: Cooperative, without pressured speech EYE: Conjunctivae pink, sclerae anicteric ENT: MMM CV: Nontachycardic RESP: No audible wheezing GI: NABS, soft, protuberant abdomen, rounded, mildly tender to palpation in MEG, no rebound or guarding  MSK/EXT: Trace bilateral pedal edema SKIN: No jaundice NEURO:  Alert & Oriented x 3, no focal deficits   REVIEW OF DATA  I reviewed the following data at the time of this encounter:  GI Procedures and Studies  2023 EGD - Non-specific distal gastritis, biopsied to check for H. pylori. - Small hiatal hernia. - The examination was otherwise normal.  2023 colonoscopy - Diverticulosis in the entire examined colon. - The examination was otherwise normal on direct and retroflexion views. - No polyps or cancers.  Laboratory Studies  Reviewed those in epic  Imaging Studies  December 2023 CT abdomen pelvis IMPRESSION: 1. No acute abdominal/pelvic findings, mass lesions or adenopathy. 2. Mild mesenteritis or panniculitis. 3. Geographic areas of fatty infiltration of the liver. 4. Status post cholecystectomy. No biliary dilatation. 5. Age advanced atherosclerotic calcifications involving the aorta and iliac arteries.    ASSESSMENT  Ms. Boutelle is a 52 y.o. female with a pmh significant for Lynch syndrome, prior breast cancer, anxiety, MDD, migraines, IBS-D, GERD, status postcholecystectomy (previous choledocholithiasis and ERCPs for removal).  The patient is seen today for evaluation and management of:  1. Lynch syndrome   2. Abdominal pain, chronic, epigastric   3. Nausea and vomiting, unspecified vomiting type   4. Pyrosis   5. Early satiety   6. Chronic diarrhea   7. History of  cholecystectomy   8. History of ERCP   9. Mesenteric adenitis    The patient is hemodynamically stable.  Clinically, the patient has multiple GI symptoms that seem to be playing issues, most of them are much more chronic issue rather than acute in nature.  In regards to her Lynch syndrome, she is due for colon cancer screening and we will move forward with that this year.  She did have an endoscopy last year but as she is having more upper GI symptoms at this time I think it is reasonable to go ahead and perform her upper endoscopy as well at the same time.  Patient's symptoms sound like she has underlying IBS diarrhea which she describes she does have, but since she has had her gallbladder out she is now at risk of bile salt diarrhea.  I am going to trial her on cholestyramine to see if this helps her symptoms somewhat and she can titrate this upwards.  In regards to her nausea and vomiting symptoms and regurgitation, we are going to put her on PPI once daily.  Will going to get some additional blood work to see if anything shows up that is concerning.  Will gena update her cross-sectional imaging to follow-up the previous mesenteritis/panniculitis.  She is at risk of SIBO as well and so we will consider SIBO breath testing in the future.  I am going to rule out pancreatic insufficiency at this time.  I am going to plan to take biopsies at the time of her colonoscopy to rule out lymphocytic/microscopic colitis.  I am hopeful that over the course of the coming weeks/months we will be able to better define and help her symptoms.  Time will tell.  The risks and benefits of endoscopic evaluation were discussed with the patient; these include but are not limited to the risk of perforation, infection, bleeding, missed lesions, lack of diagnosis, severe illness requiring hospitalization, as well as anesthesia and sedation related illnesses.  The patient and/or family is agreeable to proceed.  All patient questions  were answered to the best of my ability, and the patient agrees to the  aforementioned plan of action with follow-up as indicated.   PLAN  Laboratories as outlined below Stool studies as outlined below Endoscopy to be performed Colonoscopy to be performed CT abdomen pelvis with IV/oral contrast to be performed Initiate Aciphex daily (take before a meal) Initiate cholestyramine 1 packet daily for 1 to 2 weeks then increase to 2 packets and titrate as necessary Consider SIBO breath testing in future Consider IBS-D Xifaxan therapy in future Okay for Zofran therapy if needed in the future   Orders Placed This Encounter  Procedures   Clostridium difficile Toxin B, Qualitative, Real-Time PCR   Calprotectin, Fecal   CT ABDOMEN PELVIS W CONTRAST   CBC   Comp Met (CMET)   Amylase   Lipase   TSH   Sedimentation rate   C-reactive protein   IgA   Tissue transglutaminase, IgA   Pancreatic elastase, fecal   Ambulatory referral to Gastroenterology    New Prescriptions   CHOLESTYRAMINE (QUESTRAN) 4 G PACKET    Take 1 packet (4 g total) by mouth 2 (two) times daily.   NA SULFATE-K SULFATE-MG SULF (SUPREP BOWEL PREP KIT) 17.5-3.13-1.6 GM/177ML SOLN    Take 1 kit by mouth as directed. For colonoscopy prep   RABEPRAZOLE (ACIPHEX) 20 MG TABLET    Take 1 tablet (20 mg total) by mouth daily with breakfast.   Modified Medications   No medications on file    Planned Follow Up No follow-ups on file.   Total Time in Face-to-Face and in Coordination of Care for patient including independent/personal interpretation/review of prior testing, medical history, examination, medication adjustment, communicating results with the patient directly, and documentation within the EHR is 45 minutes.   Corliss Parish, MD [facility] Office # [phone]

## 2022-10-12 NOTE — Patient Instructions (Signed)
Your provider has requested that you go to the basement level for lab work before leaving today. Press "B" on the elevator. The lab is located at the first door on the left as you exit the elevator.  You have been scheduled for a CT scan of the abdomen and pelvis at [facility], 1st floor Radiology. You are scheduled on 10/24/22 at 4:00 pm . You should arrive 2 hour prior at 1:45 pm prior to your appointment time for registration and to drink oral contrast  Please follow the written instructions below on the day of your exam:   1) Do not eat anything after 12:00 noon  (4 hours prior to your test)     You may take any medications as prescribed with a small amount of water, if necessary. If you take any of the following medications: METFORMIN, GLUCOPHAGE, GLUCOVANCE, AVANDAMET, RIOMET, FORTAMET, ACTOPLUS MET, JANUMET, GLUMETZA or METAGLIP, you MAY be asked to HOLD this medication 48 hours AFTER the exam.   The purpose of you drinking the oral contrast is to aid in the visualization of your intestinal tract. The contrast solution may cause some diarrhea. Depending on your individual set of symptoms, you may also receive an intravenous injection of x-ray contrast/dye. Plan on being at [facility] for 45 minutes or longer, depending on the type of exam you are having performed.   If you have any questions regarding your exam or if you need to reschedule, you may call [facility] at [phone] between the hours of 8:00 am and 5:00 pm, Monday-Friday.    You have been scheduled for an endoscopy and colonoscopy. Please follow the written instructions given to you at your visit today.  Please pick up your prep supplies at the pharmacy within the next 1-3 days.  If you use inhalers (even only as needed), please bring them with you on the day of your procedure.  DO NOT TAKE 7 DAYS PRIOR TO TEST- Trulicity (dulaglutide) Ozempic, Wegovy (semaglutide) Mounjaro (tirzepatide) Bydureon  Bcise (exanatide extended release)  DO NOT TAKE 1 DAY PRIOR TO YOUR TEST Rybelsus (semaglutide) Adlyxin (lixisenatide) Victoza (liraglutide) Byetta (exanatide) ___________________________________________________________________________  We have sent the following medications to your pharmacy for you to pick up at your convenience: Suprep, Cholestyramine , Aciphex    Cholestyramine -You may take 1 to 2 packet daily as needed.   Aciphex- Take 1 pill by mouth once daily at breakfast time.   _______________________________________________________  If your blood pressure at your visit was 140/90 or greater, please contact your primary care physician to follow up on this.  _______________________________________________________  If you are age 40 or older, your body mass index should be between 23-30. Your Body mass index is 49.09 kg/m. If this is out of the aforementioned range listed, please consider follow up with your Primary Care Provider.  If you are age 79 or younger, your body mass index should be between 19-25. Your Body mass index is 49.09 kg/m. If this is out of the aformentioned range listed, please consider follow up with your Primary Care Provider.   ________________________________________________________  The [facility] providers would like to encourage you to use [facility] to communicate with providers for non-urgent requests or questions.  Due to long hold times on the telephone, sending your provider a message by [facility] may be a faster and more efficient way to get a response.  Please allow 48 business hours for a response.  Please remember that this is for non-urgent requests.  _______________________________________________________  Thank you for choosing me and [facility].  Dr. Meridee Score

## 2022-10-13 LAB — TISSUE TRANSGLUTAMINASE, IGA: (tTG) Ab, IgA: <1 U/mL

## 2022-10-18 ENCOUNTER — Ambulatory Visit: Payer: BC Managed Care – PPO

## 2022-10-18 DIAGNOSIS — R6881 Early satiety: Secondary | ICD-10-CM | POA: Diagnosis not present

## 2022-10-18 DIAGNOSIS — Z9889 Other specified postprocedural states: Secondary | ICD-10-CM | POA: Diagnosis not present

## 2022-10-18 DIAGNOSIS — K529 Noninfective gastroenteritis and colitis, unspecified: Secondary | ICD-10-CM | POA: Diagnosis not present

## 2022-10-18 DIAGNOSIS — R112 Nausea with vomiting, unspecified: Secondary | ICD-10-CM

## 2022-10-18 DIAGNOSIS — I880 Nonspecific mesenteric lymphadenitis: Secondary | ICD-10-CM | POA: Diagnosis not present

## 2022-10-18 DIAGNOSIS — Z1509 Genetic susceptibility to other malignant neoplasm: Secondary | ICD-10-CM

## 2022-10-24 ENCOUNTER — Encounter (HOSPITAL_COMMUNITY): Payer: Self-pay

## 2022-10-24 ENCOUNTER — Ambulatory Visit (HOSPITAL_COMMUNITY)
Admission: RE | Admit: 2022-10-24 | Discharge: 2022-10-24 | Disposition: A | Discharge status: Home / Self Care | Payer: BC Managed Care – PPO | Source: Ambulatory Visit | Attending: Gastroenterology | Admitting: Gastroenterology

## 2022-10-24 DIAGNOSIS — Z1509 Genetic susceptibility to other malignant neoplasm: Secondary | ICD-10-CM | POA: Diagnosis not present

## 2022-10-24 DIAGNOSIS — K529 Noninfective gastroenteritis and colitis, unspecified: Secondary | ICD-10-CM | POA: Insufficient documentation

## 2022-10-24 DIAGNOSIS — R1011 Right upper quadrant pain: Secondary | ICD-10-CM | POA: Diagnosis not present

## 2022-10-24 DIAGNOSIS — K5730 Diverticulosis of large intestine without perforation or abscess without bleeding: Secondary | ICD-10-CM | POA: Diagnosis not present

## 2022-10-24 DIAGNOSIS — R6881 Early satiety: Secondary | ICD-10-CM | POA: Insufficient documentation

## 2022-10-24 DIAGNOSIS — Z9889 Other specified postprocedural states: Secondary | ICD-10-CM | POA: Diagnosis not present

## 2022-10-24 DIAGNOSIS — K760 Fatty (change of) liver, not elsewhere classified: Secondary | ICD-10-CM | POA: Diagnosis not present

## 2022-10-24 DIAGNOSIS — I880 Nonspecific mesenteric lymphadenitis: Secondary | ICD-10-CM | POA: Insufficient documentation

## 2022-10-24 DIAGNOSIS — R112 Nausea with vomiting, unspecified: Secondary | ICD-10-CM | POA: Diagnosis not present

## 2022-10-24 MED ORDER — SODIUM CHLORIDE (PF) 0.9 % IJ SOLN
INTRAMUSCULAR | Status: AC
  Filled 2022-10-24: qty 50

## 2022-10-24 MED ORDER — IOHEXOL 9 MG/ML PO SOLN: 500.0000 mL | ORAL | Status: AC

## 2022-10-24 MED ORDER — IOHEXOL 300 MG/ML  SOLN
100.0000 mL | Freq: Once | INTRAMUSCULAR | Status: AC | PRN
  Administered 2022-10-24: 100 mL via INTRAVENOUS

## 2022-10-25 ENCOUNTER — Telehealth: Payer: Self-pay | Admitting: Gastroenterology

## 2022-10-25 NOTE — Telephone Encounter (Signed)
The pt will call tomorrow with an update and will await call with CT results.

## 2022-10-25 NOTE — Telephone Encounter (Signed)
PT had a CT yesterday and woke up this morning with pain in her abdomen. She wants to know if this is happening because of contrast. Should be concerned and is it okay to take pain medications? Please advise.

## 2022-10-25 NOTE — Telephone Encounter (Signed)
The pt had CT yesterday for Lynch syndrome, Nausea and vomiting and abd pain. She  woke up around 3 am with pretty intense abd discomfort.  We did discuss that she may have some gas and diarrhea after the CT due to the contrast.  She has been asked to drink some warm tea, use a heating pad on her abd and she has taken some tylenol. She will monitor and see if the discomfort resolves completley.  She says she does feel better than early this morning.  I will forward to Dr Meridee Score for any further recommendations.

## 2022-10-25 NOTE — Telephone Encounter (Signed)
Thank you Patty for the update. I am not sure what to make of it. I am glad that she is doing better. Please follow-up tomorrow to see how things are going. No update from the CT scan as it has not been read yet.  Hopefully by tomorrow. Thanks. GM

## 2022-10-26 NOTE — Telephone Encounter (Signed)
Abd pain has resolved however her lower back has now started hurting.  She will call her PCP and wait for CT results.  I will also send to Dr Meridee Score for any further recommendations.

## 2022-10-26 NOTE — Telephone Encounter (Signed)
Thanks for update. GM 

## 2022-10-26 NOTE — Telephone Encounter (Signed)
Patient called stating her symptoms are not better, and is requesting a call back to discuss. Please advise.

## 2022-11-03 ENCOUNTER — Other Ambulatory Visit: Payer: Self-pay | Admitting: Gastroenterology

## 2022-12-09 ENCOUNTER — Encounter: Payer: Self-pay | Admitting: Gastroenterology

## 2022-12-15 DIAGNOSIS — N8110 Cystocele, unspecified: Secondary | ICD-10-CM | POA: Diagnosis not present

## 2022-12-15 DIAGNOSIS — R3982 Chronic bladder pain: Secondary | ICD-10-CM | POA: Diagnosis not present

## 2022-12-21 ENCOUNTER — Telehealth: Payer: Self-pay

## 2022-12-21 DIAGNOSIS — Z0000 Encounter for general adult medical examination without abnormal findings: Secondary | ICD-10-CM | POA: Diagnosis not present

## 2022-12-21 DIAGNOSIS — K529 Noninfective gastroenteritis and colitis, unspecified: Secondary | ICD-10-CM

## 2022-12-21 DIAGNOSIS — E786 Lipoprotein deficiency: Secondary | ICD-10-CM | POA: Diagnosis not present

## 2022-12-21 DIAGNOSIS — G8929 Other chronic pain: Secondary | ICD-10-CM

## 2022-12-21 DIAGNOSIS — F4323 Adjustment disorder with mixed anxiety and depressed mood: Secondary | ICD-10-CM | POA: Diagnosis not present

## 2022-12-21 DIAGNOSIS — Z0189 Encounter for other specified special examinations: Secondary | ICD-10-CM | POA: Diagnosis not present

## 2022-12-21 DIAGNOSIS — Z1507 Genetic susceptibility to other malignant neoplasm: Secondary | ICD-10-CM

## 2022-12-21 DIAGNOSIS — R112 Nausea with vomiting, unspecified: Secondary | ICD-10-CM

## 2022-12-21 DIAGNOSIS — Z1212 Encounter for screening for malignant neoplasm of rectum: Secondary | ICD-10-CM | POA: Diagnosis not present

## 2022-12-21 DIAGNOSIS — R6881 Early satiety: Secondary | ICD-10-CM

## 2022-12-21 DIAGNOSIS — Z1509 Genetic susceptibility to other malignant neoplasm: Secondary | ICD-10-CM

## 2022-12-21 NOTE — Telephone Encounter (Signed)
The pt has been advised that she is due for fecal calprotectin.  She will come in as soon as able.

## 2022-12-21 NOTE — Telephone Encounter (Signed)
-----   Message from Nurse Madelena Maturin P sent at 10/21/2022  4:55 PM EDT ----- recall for 90-month follow-up fecal calprotectin

## 2022-12-23 ENCOUNTER — Encounter: Payer: Self-pay | Admitting: Gastroenterology

## 2022-12-23 ENCOUNTER — Ambulatory Visit (AMBULATORY_SURGERY_CENTER): Payer: BC Managed Care – PPO | Admitting: Gastroenterology

## 2022-12-23 VITALS — BP 131/62 | HR 88 | Temp 97.7°F | Resp 24 | Ht 62.0 in | Wt 268.0 lb

## 2022-12-23 DIAGNOSIS — K449 Diaphragmatic hernia without obstruction or gangrene: Secondary | ICD-10-CM | POA: Diagnosis not present

## 2022-12-23 DIAGNOSIS — Z1509 Genetic susceptibility to other malignant neoplasm: Secondary | ICD-10-CM | POA: Diagnosis not present

## 2022-12-23 DIAGNOSIS — K222 Esophageal obstruction: Secondary | ICD-10-CM

## 2022-12-23 DIAGNOSIS — K529 Noninfective gastroenteritis and colitis, unspecified: Secondary | ICD-10-CM | POA: Diagnosis not present

## 2022-12-23 DIAGNOSIS — K229 Disease of esophagus, unspecified: Secondary | ICD-10-CM

## 2022-12-23 DIAGNOSIS — G8929 Other chronic pain: Secondary | ICD-10-CM

## 2022-12-23 DIAGNOSIS — D127 Benign neoplasm of rectosigmoid junction: Secondary | ICD-10-CM

## 2022-12-23 DIAGNOSIS — D123 Benign neoplasm of transverse colon: Secondary | ICD-10-CM

## 2022-12-23 DIAGNOSIS — D122 Benign neoplasm of ascending colon: Secondary | ICD-10-CM

## 2022-12-23 DIAGNOSIS — K635 Polyp of colon: Secondary | ICD-10-CM | POA: Diagnosis not present

## 2022-12-23 DIAGNOSIS — Z1507 Genetic susceptibility to other malignant neoplasm: Secondary | ICD-10-CM

## 2022-12-23 DIAGNOSIS — D125 Benign neoplasm of sigmoid colon: Secondary | ICD-10-CM

## 2022-12-23 DIAGNOSIS — R1013 Epigastric pain: Secondary | ICD-10-CM | POA: Diagnosis not present

## 2022-12-23 DIAGNOSIS — K319 Disease of stomach and duodenum, unspecified: Secondary | ICD-10-CM | POA: Diagnosis not present

## 2022-12-23 DIAGNOSIS — K2960 Other gastritis without bleeding: Secondary | ICD-10-CM

## 2022-12-23 MED ORDER — SODIUM CHLORIDE 0.9 % IV SOLN: 500.0000 mL | Freq: Once | INTRAVENOUS | Status: DC

## 2022-12-23 MED ORDER — COLESTIPOL HCL 1 G PO TABS: 1.0000 g | ORAL_TABLET | Freq: Two times a day (BID) | ORAL | 6 refills | Status: DC

## 2022-12-23 MED ORDER — COLESTIPOL HCL 1 G PO TABS: 1.0000 g | ORAL_TABLET | Freq: Two times a day (BID) | ORAL | 1 refills | Status: DC

## 2022-12-23 NOTE — Progress Notes (Unsigned)
Sedate, gd SR, tolerated procedure well, VSS, report to RN 

## 2022-12-23 NOTE — Progress Notes (Unsigned)
I have reviewed the patient's medical history in detail and updated the computerized patient record.

## 2022-12-23 NOTE — Progress Notes (Signed)
Called to room to assist during endoscopic procedure.  Patient ID and intended procedure confirmed with present staff. Received instructions for my participation in the procedure from the performing physician.  

## 2022-12-23 NOTE — Progress Notes (Unsigned)
GASTROENTEROLOGY PROCEDURE H&P NOTE   Primary Care Physician: Tisovec, Adelfa Koh, MD  HPI: Lynn Simpson is a 52 y.o. female who presents for EGD/Colonoscopy for evaluation of abdominal pain, early satiety, pyrosis, and history of Lynch Syndrome.  Past Medical History:  Diagnosis Date   Allergy    Anxiety    Asthma    Back pain    Cancer (HCC) 2018   Left Breast   COVID-19 virus infection 11/2019   Depression    Dyspnea    Gallstones    GERD (gastroesophageal reflux disease)    IBS (irritable bowel syndrome)    Lower extremity edema    Lynch syndrome    Migraine    Obesity    Ovarian cyst    Vitamin D deficiency    Past Surgical History:  Procedure Laterality Date   ANKLE FRACTURE SURGERY Right 12/2018   BREAST RECONSTRUCTION WITH PLACEMENT OF TISSUE EXPANDER AND FLEX HD (ACELLULAR HYDRATED DERMIS) Bilateral 03/31/2016   Procedure: BILATERAL BREAST RECONSTRUCTION WITH PLACEMENT OF TISSUE EXPANDER AND FLEX HD (ACELLULAR HYDRATED DERMIS);  Surgeon: Peggye Form, DO;  Location: [facility];  Service: Plastics;  Laterality: Bilateral;   CESAREAN SECTION  2005   CHOLECYSTECTOMY N/A 03/13/2021   Procedure: LAPAROSCOPIC CHOLECYSTECTOMY WITH  INTRAOPERATIVE CHOLANGIOGRAM;  Surgeon: Harriette Bouillon, MD;  Location: [facility];  Service: General;  Laterality: N/A;   COLONOSCOPY     ERCP N/A 03/12/2021   Procedure: ENDOSCOPIC RETROGRADE CHOLANGIOPANCREATOGRAPHY (ERCP);  Surgeon: Lemar Lofty., MD;  Location: [facility];  Service: Gastroenterology;  Laterality: N/A;   IR PERCUTANEOUS TRANSHEPATIC CHOLANGIOGRAM  03/15/2021   LAPAROSCOPIC VAGINAL HYSTERECTOMY WITH SALPINGO OOPHORECTOMY Bilateral 09/15/2016   Procedure: LAPAROSCOPIC ASSISTED VAGINAL HYSTERECTOMY WITH BILATERAL SALPINGO OOPHORECTOMY;  Surgeon: Richarda Overlie, MD;  Location: [facility];  Service: Gynecology;  Laterality: Bilateral;   LIPOSUCTION Bilateral 06/30/2016   Procedure: LIPOSUCTION TO  LATERAL CHEST;  Surgeon: Peggye Form, DO;  Location: [facility];  Service: Plastics;  Laterality: Bilateral;   NIPPLE SPARING MASTECTOMY Bilateral 03/31/2016   Procedure: BILATERAL NIPPLE SPARING MASTECTOMIES;  Surgeon: Chevis Pretty III, MD;  Location: [facility];  Service: General;  Laterality: Bilateral;   PANCREATIC STENT PLACEMENT  03/12/2021   Procedure: PANCREATIC STENT PLACEMENT;  Surgeon: Lemar Lofty., MD;  Location: [facility];  Service: Gastroenterology;;   POLYPECTOMY     REMOVAL OF BILATERAL TISSUE EXPANDERS WITH PLACEMENT OF BILATERAL BREAST IMPLANTS Bilateral 06/30/2016   Procedure: REMOVAL OF BILATERAL TISSUE EXPANDERS WITH PLACEMENT OF BILATERAL SILCONE BREAST IMPLANTS;  Surgeon: Peggye Form, DO;  Location: [facility];  Service: Plastics;  Laterality: Bilateral;   SPHINCTEROTOMY  03/12/2021   Procedure: SPHINCTEROTOMY;  Surgeon: Mansouraty, Netty Starring., MD;  Location: [facility];  Service: Gastroenterology;;  needle knife   WISDOM TOOTH EXTRACTION     Current Outpatient Medications  Medication Sig Dispense Refill   cholestyramine (QUESTRAN) 4 g packet TAKE 1 PACKET (4 G TOTAL) BY MOUTH 2 TIMES DAILY. 180 packet 1   famotidine (PEPCID) 40 MG tablet Take 1 tablet (40 mg total) by mouth daily. (Patient not taking: Reported on 10/12/2022) 90 tablet 1   hyoscyamine (LEVSIN SL) 0.125 MG SL tablet Place 1 tablet (0.125 mg total) under the tongue every 6 (six) hours as needed. (Patient not taking: Reported on 10/12/2022) 30 tablet 1   ibuprofen (ADVIL) 200 MG tablet Take 200-600 mg by mouth as needed.     Na Sulfate-K Sulfate-Mg  Sulf (SUPREP BOWEL PREP KIT) 17.5-3.13-1.6 GM/177ML SOLN Take 1 kit by mouth as directed. For colonoscopy prep 354 mL 0   ondansetron (ZOFRAN-ODT) 8 MG disintegrating tablet Dissolve one tablet on the tongue every 8 hours as needed for nausea and vomiting. (Patient not taking: Reported on  10/12/2022) 30 tablet 1   promethazine (PHENERGAN) 12.5 MG tablet Take 1 tablet (12.5 mg total) by mouth 2 (two) times daily as needed for nausea or vomiting. (Patient not taking: Reported on 10/12/2022) 30 tablet 1   RABEprazole (ACIPHEX) 20 MG tablet Take 1 tablet (20 mg total) by mouth daily with breakfast. 30 tablet 6   Current Facility-Administered Medications  Medication Dose Route Frequency Provider Last Rate Last Admin   0.9 %  sodium chloride infusion  500 mL Intravenous Once Mansouraty, Netty Starring., MD        Current Outpatient Medications:    cholestyramine (QUESTRAN) 4 g packet, TAKE 1 PACKET (4 G TOTAL) BY MOUTH 2 TIMES DAILY., Disp: 180 packet, Rfl: 1   famotidine (PEPCID) 40 MG tablet, Take 1 tablet (40 mg total) by mouth daily. (Patient not taking: Reported on 10/12/2022), Disp: 90 tablet, Rfl: 1   hyoscyamine (LEVSIN SL) 0.125 MG SL tablet, Place 1 tablet (0.125 mg total) under the tongue every 6 (six) hours as needed. (Patient not taking: Reported on 10/12/2022), Disp: 30 tablet, Rfl: 1   ibuprofen (ADVIL) 200 MG tablet, Take 200-600 mg by mouth as needed., Disp: , Rfl:    Na Sulfate-K Sulfate-Mg Sulf (SUPREP BOWEL PREP KIT) 17.5-3.13-1.6 GM/177ML SOLN, Take 1 kit by mouth as directed. For colonoscopy prep, Disp: 354 mL, Rfl: 0   ondansetron (ZOFRAN-ODT) 8 MG disintegrating tablet, Dissolve one tablet on the tongue every 8 hours as needed for nausea and vomiting. (Patient not taking: Reported on 10/12/2022), Disp: 30 tablet, Rfl: 1   promethazine (PHENERGAN) 12.5 MG tablet, Take 1 tablet (12.5 mg total) by mouth 2 (two) times daily as needed for nausea or vomiting. (Patient not taking: Reported on 10/12/2022), Disp: 30 tablet, Rfl: 1   RABEprazole (ACIPHEX) 20 MG tablet, Take 1 tablet (20 mg total) by mouth daily with breakfast., Disp: 30 tablet, Rfl: 6  Current Facility-Administered Medications:    0.9 %  sodium chloride infusion, 500 mL, Intravenous, Once, Mansouraty, Netty Starring.,  MD Allergies  Allergen Reactions   Bee Venom Anaphylaxis   Wellbutrin [Bupropion] Anaphylaxis   Family History  Problem Relation Age of Onset   Breast cancer Mother 54   Liver cancer Mother    Alcoholism Mother    Lung cancer Father 39   Breast cancer Sister 20   Melanoma Maternal Aunt        mother's maternal half sister   Breast cancer Maternal Grandmother    Ovarian cancer Maternal Grandmother    Colon cancer Maternal Grandmother    Cancer Paternal Grandfather        NOS   Colon polyps Neg Hx    Rectal cancer Neg Hx    Stomach cancer Neg Hx    Esophageal cancer Neg Hx    Inflammatory bowel disease Neg Hx    Pancreatic cancer Neg Hx    Social History   Socioeconomic History   Marital status: Married    Spouse name: Raynell Upton   Number of children: 3   Years of education: Not on file   Highest education level: Not on file  Occupational History   Occupation: Sales  Tobacco Use   Smoking status:  Every Day    Current packs/day: 0.00    Average packs/day: 0.3 packs/day for 20.0 years (5.0 ttl pk-yrs)    Types: Cigarettes    Start date: 12/20/1995    Last attempt to quit: 12/20/2015    Years since quitting: 7.0   Smokeless tobacco: Never   Tobacco comments:    form given [phone]  Vaping Use   Vaping status: Never Used  Substance and Sexual Activity   Alcohol use: Yes    Comment: occassionally   Drug use: No   Sexual activity: Yes    Birth control/protection: I.U.D., Post-menopausal    Comment: IUD REMOVED 09/07/16  Other Topics Concern   Not on file  Social History Narrative   Not on file   Social Determinants of Health   Financial Resource Strain: Low Risk  (03/25/2021)   Received from [facility], [facility]   Overall Financial Resource Strain (CARDIA)    Difficulty of Paying Living Expenses: Not hard at all  Food Insecurity: No Food Insecurity (03/25/2021)   Received from [facility], [facility]   Hunger Vital Sign    Worried About Running Out of Food in the Last Year: Never true    Ran Out of Food in the Last Year: Never true  Transportation Needs: No Transportation Needs (03/25/2021)   Received from [facility], [facility]   [facility] - Transportation    In the past 12 months, has lack of transportation kept you from medical appointments or from getting medications?: No    Lack of Transportation (Non-Medical): No  Physical Activity: Not on file  Stress: Not on file  Social Connections: Not on file  Intimate Partner Violence: Not on file    Physical Exam: Today's Vitals   12/23/22 1338  BP: 130/76  Pulse: 90  Temp: 97.7 F (36.5 C)  TempSrc: Temporal  SpO2: 93%  Weight: 268 lb (121.6 kg)  Height: 5\' 2"  (1.575 m)   Body mass index is 49.02 kg/m. GEN: NAD EYE: Sclerae anicteric ENT: MMM CV: Non-tachycardic GI: Soft, NT/ND NEURO:  Alert & Oriented x 3  Lab Results: No results for input(s): "WBC", "HGB", "HCT", "PLT" in the last 72 hours. BMET No results for input(s): "NA", "K", "CL", "CO2", "GLUCOSE", "BUN", "CREATININE", "CALCIUM" in the last 72 hours. LFT No results for input(s): "PROT", "ALBUMIN", "AST", "ALT", "ALKPHOS", "BILITOT", "BILIDIR", "IBILI" in the last 72 hours. PT/INR No results for input(s): "LABPROT", "INR" in the last 72 hours.   Impression / Plan: This is a 52 y.o.female who presents for EGD/Colonoscopy for evaluation of abdominal pain, early satiety, pyrosis, and history of Lynch Syndrome.  The risks and benefits of endoscopic evaluation/treatment were discussed with the patient and/or family; these include but are not limited to the risk of perforation, infection, bleeding, missed lesions, lack of diagnosis, severe illness requiring hospitalization, as well as anesthesia and sedation related illnesses.  The patient's history has been reviewed, patient examined, no change in status, and deemed  stable for procedure.  The patient and/or family is agreeable to proceed.    Corliss Parish, MD [facility] Office # [phone]

## 2022-12-23 NOTE — Patient Instructions (Signed)
Discharge instructions given. Handouts on polyps,Diverticulosis,Hemorrhoids and Hiatal Hernia,Gastritis. Resume previous medications. Prescription sent to pharmacy. YOU HAD AN ENDOSCOPIC PROCEDURE TODAY AT THE [facility]:   Refer to the procedure report that was given to you for any specific questions about what was found during the examination.  If the procedure report does not answer your questions, please call your gastroenterologist to clarify.  If you requested that your care partner not be given the details of your procedure findings, then the procedure report has been included in a sealed envelope for you to review at your convenience later.  YOU SHOULD EXPECT: Some feelings of bloating in the abdomen. Passage of more gas than usual.  Walking can help get rid of the air that was put into your GI tract during the procedure and reduce the bloating. If you had a lower endoscopy (such as a colonoscopy or flexible sigmoidoscopy) you may notice spotting of blood in your stool or on the toilet paper. If you underwent a bowel prep for your procedure, you may not have a normal bowel movement for a few days.  Please Note:  You might notice some irritation and congestion in your nose or some drainage.  This is from the oxygen used during your procedure.  There is no need for concern and it should clear up in a day or so.  SYMPTOMS TO REPORT IMMEDIATELY:  Following lower endoscopy (colonoscopy or flexible sigmoidoscopy):  Excessive amounts of blood in the stool  Significant tenderness or worsening of abdominal pains  Swelling of the abdomen that is new, acute  Fever of 100F or higher  Following upper endoscopy (EGD)  Vomiting of blood or coffee ground material  New chest pain or pain under the shoulder blades  Painful or persistently difficult swallowing  New shortness of breath  Fever of 100F or higher  Black, tarry-looking stools  For urgent or emergent issues, a  gastroenterologist can be reached at any hour by calling [phone]. Do not use MyChart messaging for urgent concerns.    DIET:  We do recommend a small meal at first, but then you may proceed to your regular diet.  Drink plenty of fluids but you should avoid alcoholic beverages for 24 hours.  ACTIVITY:  You should plan to take it easy for the rest of today and you should NOT DRIVE or use heavy machinery until tomorrow (because of the sedation medicines used during the test).    FOLLOW UP: Our staff will call the number listed on your records the next business day following your procedure.  We will call around 7:15- 8:00 am to check on you and address any questions or concerns that you may have regarding the information given to you following your procedure. If we do not reach you, we will leave a message.     If any biopsies were taken you will be contacted by phone or by letter within the next 1-3 weeks.  Please call us at [phone] if you have not heard about the biopsies in 3 weeks.    SIGNATURES/CONFIDENTIALITY: You and/or your care partner have signed paperwork which will be entered into your electronic medical record.  These signatures attest to the fact that that the information above on your After Visit Summary has been reviewed and is understood.  Full responsibility of the confidentiality of this discharge information lies with you and/or your care-partner.

## 2022-12-23 NOTE — Op Note (Signed)
[facility] Patient Name: Lynn Simpson Procedure Date: 12/23/2022 2:03 PM MRN: [id number] Endoscopist: Corliss Parish , MD, [id number] Age: 52 Referring MD:  Date of Birth: 05/31/1970 Gender: Female Account #: [id number] Procedure:                Colonoscopy Indications:              Lynch Syndrome, Incidental change in bowel habits                            noted since cholecystectomy Medicines:                Monitored Anesthesia Care Procedure:                Pre-Anesthesia Assessment:                           - Prior to the procedure, a History and Physical                            was performed, and patient medications and                            allergies were reviewed. The patient's tolerance of                            previous anesthesia was also reviewed. The risks                            and benefits of the procedure and the sedation                            options and risks were discussed with the patient.                            All questions were answered, and informed consent                            was obtained. Prior Anticoagulants: The patient has                            taken no anticoagulant or antiplatelet agents. ASA                            Grade Assessment: II - A patient with mild systemic                            disease. After reviewing the risks and benefits,                            the patient was deemed in satisfactory condition to                            undergo the procedure.  After obtaining informed consent, the colonoscope                            was passed under direct vision. Throughout the                            procedure, the patient's blood pressure, pulse, and                            oxygen saturations were monitored continuously. The                            CF [id number] #[id number] was introduced through the anus                            and advanced to the 3 cm  into the ileum. The                            colonoscopy was performed without difficulty. The                            patient tolerated the procedure. The quality of the                            bowel preparation was adequate. The terminal ileum,                            ileocecal valve, appendiceal orifice, and rectum                            were photographed. Scope In: 2:17:34 PM Scope Out: 2:34:12 PM Scope Withdrawal Time: 0 hours 13 minutes 27 seconds  Total Procedure Duration: 0 hours 16 minutes 38 seconds  Findings:                 Skin tags were found on perianal exam.                           The digital rectal exam findings include                            hemorrhoids. Pertinent negatives include no                            palpable rectal lesions.                           The terminal ileum and ileocecal valve appeared                            normal.                           Six sessile polyps were found in the recto-sigmoid  colon (2), sigmoid colon (1), transverse colon (1)                            and ascending colon (2). The polyps were 2 to 7 mm                            in size. These polyps were removed with a cold                            snare. Resection and retrieval were complete.                           Many medium-mouthed and small-mouthed diverticula                            were found in the entire colon.                           Normal mucosa was found in the entire colon                            otherwise. Biopsies for histology were taken with a                            cold forceps from the entire colon for evaluation                            of microscopic colitis.                           Non-bleeding non-thrombosed external and internal                            hemorrhoids were found during retroflexion, during                            perianal exam and during digital exam. The                             hemorrhoids were Grade II (internal hemorrhoids                            that prolapse but reduce spontaneously). Complications:            No immediate complications. Estimated Blood Loss:     Estimated blood loss was minimal. Impression:               - Perianal skin tags found on perianal exam.                           - Hemorrhoids found on digital rectal exam.                           - The examined portion of the ileum was normal.                           -  Six, 2 to 7 mm polyps at the recto-sigmoid colon,                            in the sigmoid colon, in the transverse colon and                            in the ascending colon, removed with a cold snare.                            Resected and retrieved.                           - Diverticulosis in the entire examined colon.                           - Normal mucosa in the entire examined colon                            otherwise. Biopsied.                           - Non-bleeding non-thrombosed external and internal                            hemorrhoids. Recommendation:           - The patient will be observed post-procedure,                            until all discharge criteria are met.                           - Discharge patient to home.                           - Patient has a contact number available for                            emergencies. The signs and symptoms of potential                            delayed complications were discussed with the                            patient. Return to normal activities tomorrow.                            Written discharge instructions were provided to the                            patient.                           - High fiber diet.                           -  Transition cholestyramine to colestipol 1 tablet                            twice daily. See if that she is able to tolerate                            this without the swelling that  occurred with the                            cholestyramine medication.                           - Continue present medications.                           - Await pathology results.                           - Repeat colonoscopy in 1 year for surveillance due                            to underlying Lynch syndrome.                           - The findings and recommendations were discussed                            with the patient.                           - The findings and recommendations were discussed                            with the designated responsible adult. Corliss Parish, MD 12/23/2022 2:44:26 PM

## 2022-12-23 NOTE — Op Note (Signed)
[facility] Patient Name: Lynn Simpson Procedure Date: 12/23/2022 2:04 PM MRN: [id number] Endoscopist: Corliss Parish , MD, [id number] Age: 52 Referring MD:  Date of Birth: 07/11/1970 Gender: Female Account #: [id number] Procedure:                Upper GI endoscopy Indications:              Generalized abdominal pain, Heartburn, Suspected                            gastro-esophageal reflux disease Medicines:                Monitored Anesthesia Care Procedure:                Pre-Anesthesia Assessment:                           - Prior to the procedure, a History and Physical                            was performed, and patient medications and                            allergies were reviewed. The patient's tolerance of                            previous anesthesia was also reviewed. The risks                            and benefits of the procedure and the sedation                            options and risks were discussed with the patient.                            All questions were answered, and informed consent                            was obtained. Prior Anticoagulants: The patient has                            taken no anticoagulant or antiplatelet agents. ASA                            Grade Assessment: II - A patient with mild systemic                            disease. After reviewing the risks and benefits,                            the patient was deemed in satisfactory condition to                            undergo the procedure.  After obtaining informed consent, the endoscope was                            passed under direct vision. Throughout the                            procedure, the patient's blood pressure, pulse, and                            oxygen saturations were monitored continuously. The                            Olympus Scope [id number] was introduced through the                            mouth, and  advanced to the second part of duodenum.                            The upper GI endoscopy was accomplished without                            difficulty. The patient tolerated the procedure. Scope In: Scope Out: Findings:                 No gross lesions were noted in the entire esophagus.                           A non-obstructing Schatzki ring was found at the                            gastroesophageal junction.                           The Z-line was irregular and was found 33 cm from                            the incisors.                           A 4 cm hiatal hernia was present.                           Patchy mildly erythematous mucosa without bleeding                            was found in the entire examined stomach. Biopsies                            were taken with a cold forceps for histology and                            Helicobacter pylori testing.                           No gross lesions were noted  in the duodenal bulb,                            in the first portion of the duodenum and in the                            second portion of the duodenum. Biopsies were taken                            with a cold forceps for histology. Complications:            No immediate complications. Estimated Blood Loss:     Estimated blood loss was minimal. Impression:               - No gross lesions in the entire esophagus.                            Non-obstructing Schatzki ring at GE junction.                            Z-line irregular, 33 cm from the incisors.                           - 4 cm hiatal hernia.                           - Erythematous mucosa in the stomach. Biopsied.                           - No gross lesions in the duodenal bulb, in the                            first portion of the duodenum and in the second                            portion of the duodenum. Biopsied. Recommendation:           - Proceed to scheduled colonoscopy.                            - Continue present medications.                           - Await pathology results.                           - Repeat upper endoscopy in 2-4 years for screening                            purposes based on underlying Lynch syndrome.                           - The findings and recommendations were discussed                            with  the patient.                           - The findings and recommendations were discussed                            with the designated responsible adult. Corliss Parish, MD 12/23/2022 2:39:50 PM

## 2022-12-26 ENCOUNTER — Telehealth: Payer: Self-pay | Admitting: *Deleted

## 2022-12-26 NOTE — Telephone Encounter (Signed)
  Follow up Call-     12/23/2022    1:39 PM 06/02/2021   12:47 PM  Call back number  Post procedure Call Back phone  # [phone]  Permission to leave phone message Yes Yes     Patient questions:  Do you have a fever, pain , or abdominal swelling? No. Pain Score  0 *  Have you tolerated food without any problems? Yes.    Have you been able to return to your normal activities? Yes.    Do you have any questions about your discharge instructions: Diet   No. Medications  No. Follow up visit  No.  Do you have questions or concerns about your Care? No.  Actions: * If pain score is 4 or above: No action needed, pain <4.

## 2022-12-28 DIAGNOSIS — Z0000 Encounter for general adult medical examination without abnormal findings: Secondary | ICD-10-CM | POA: Diagnosis not present

## 2022-12-28 DIAGNOSIS — Z1331 Encounter for screening for depression: Secondary | ICD-10-CM | POA: Diagnosis not present

## 2022-12-28 DIAGNOSIS — R030 Elevated blood-pressure reading, without diagnosis of hypertension: Secondary | ICD-10-CM | POA: Diagnosis not present

## 2022-12-28 DIAGNOSIS — Z1339 Encounter for screening examination for other mental health and behavioral disorders: Secondary | ICD-10-CM | POA: Diagnosis not present

## 2022-12-28 DIAGNOSIS — R82998 Other abnormal findings in urine: Secondary | ICD-10-CM | POA: Diagnosis not present

## 2022-12-28 LAB — SURGICAL PATHOLOGY

## 2022-12-29 ENCOUNTER — Encounter: Payer: Self-pay | Admitting: Gastroenterology

## 2023-02-27 DIAGNOSIS — R058 Other specified cough: Secondary | ICD-10-CM | POA: Diagnosis not present

## 2023-02-27 DIAGNOSIS — J189 Pneumonia, unspecified organism: Secondary | ICD-10-CM | POA: Diagnosis not present

## 2023-03-27 DIAGNOSIS — G4730 Sleep apnea, unspecified: Secondary | ICD-10-CM | POA: Diagnosis not present

## 2023-03-27 DIAGNOSIS — J358 Other chronic diseases of tonsils and adenoids: Secondary | ICD-10-CM | POA: Diagnosis not present

## 2023-03-29 ENCOUNTER — Other Ambulatory Visit: Payer: Self-pay | Admitting: Gastroenterology

## 2023-03-30 DIAGNOSIS — Z01419 Encounter for gynecological examination (general) (routine) without abnormal findings: Secondary | ICD-10-CM | POA: Diagnosis not present

## 2023-03-30 DIAGNOSIS — Z6841 Body Mass Index (BMI) 40.0 and over, adult: Secondary | ICD-10-CM | POA: Diagnosis not present

## 2023-03-31 ENCOUNTER — Other Ambulatory Visit: Payer: Self-pay | Admitting: Obstetrics and Gynecology

## 2023-03-31 DIAGNOSIS — R1032 Left lower quadrant pain: Secondary | ICD-10-CM

## 2023-04-12 ENCOUNTER — Encounter: Payer: Self-pay | Admitting: Obstetrics and Gynecology

## 2023-04-18 DIAGNOSIS — E7800 Pure hypercholesterolemia, unspecified: Secondary | ICD-10-CM | POA: Diagnosis not present

## 2023-04-20 DIAGNOSIS — J343 Hypertrophy of nasal turbinates: Secondary | ICD-10-CM | POA: Diagnosis not present

## 2023-04-24 ENCOUNTER — Ambulatory Visit
Admission: RE | Admit: 2023-04-24 | Discharge: 2023-04-24 | Disposition: A | Discharge status: Home / Self Care | Payer: BC Managed Care – PPO | Source: Ambulatory Visit | Attending: Obstetrics and Gynecology | Admitting: Obstetrics and Gynecology

## 2023-04-24 DIAGNOSIS — K5730 Diverticulosis of large intestine without perforation or abscess without bleeding: Secondary | ICD-10-CM | POA: Diagnosis not present

## 2023-04-24 DIAGNOSIS — R1032 Left lower quadrant pain: Secondary | ICD-10-CM | POA: Diagnosis not present

## 2023-04-24 DIAGNOSIS — K769 Liver disease, unspecified: Secondary | ICD-10-CM | POA: Diagnosis not present

## 2023-04-24 MED ORDER — IOPAMIDOL (ISOVUE-370) INJECTION 76%
500.0000 mL | Freq: Once | INTRAVENOUS | Status: AC | PRN
  Administered 2023-04-24: 80 mL via INTRAVENOUS

## 2023-05-22 DIAGNOSIS — J301 Allergic rhinitis due to pollen: Secondary | ICD-10-CM | POA: Diagnosis not present

## 2023-06-30 DIAGNOSIS — E7800 Pure hypercholesterolemia, unspecified: Secondary | ICD-10-CM | POA: Diagnosis not present

## 2023-09-30 ENCOUNTER — Other Ambulatory Visit: Payer: Self-pay | Admitting: Gastroenterology

## 2023-09-30 DIAGNOSIS — K529 Noninfective gastroenteritis and colitis, unspecified: Secondary | ICD-10-CM

## 2023-12-29 DIAGNOSIS — E7800 Pure hypercholesterolemia, unspecified: Secondary | ICD-10-CM | POA: Diagnosis not present

## 2023-12-31 ENCOUNTER — Other Ambulatory Visit: Payer: Self-pay | Admitting: Gastroenterology

## 2024-01-05 DIAGNOSIS — Z0000 Encounter for general adult medical examination without abnormal findings: Secondary | ICD-10-CM | POA: Diagnosis not present

## 2024-01-05 DIAGNOSIS — K219 Gastro-esophageal reflux disease without esophagitis: Secondary | ICD-10-CM | POA: Diagnosis not present

## 2024-03-04 ENCOUNTER — Ambulatory Visit: Admitting: Bariatrics

## 2024-03-04 ENCOUNTER — Encounter: Payer: Self-pay | Admitting: Bariatrics

## 2024-03-04 VITALS — BP 133/84 | HR 92 | Ht 63.0 in | Wt 237.0 lb

## 2024-03-04 DIAGNOSIS — E559 Vitamin D deficiency, unspecified: Secondary | ICD-10-CM | POA: Diagnosis not present

## 2024-03-04 DIAGNOSIS — R5383 Other fatigue: Secondary | ICD-10-CM

## 2024-03-04 DIAGNOSIS — Z6841 Body Mass Index (BMI) 40.0 and over, adult: Secondary | ICD-10-CM

## 2024-03-04 DIAGNOSIS — K760 Fatty (change of) liver, not elsewhere classified: Secondary | ICD-10-CM | POA: Diagnosis not present

## 2024-03-04 DIAGNOSIS — E538 Deficiency of other specified B group vitamins: Secondary | ICD-10-CM | POA: Diagnosis not present

## 2024-03-04 DIAGNOSIS — R0602 Shortness of breath: Secondary | ICD-10-CM

## 2024-03-04 DIAGNOSIS — Z0000 Encounter for general adult medical examination without abnormal findings: Secondary | ICD-10-CM

## 2024-03-04 DIAGNOSIS — E669 Obesity, unspecified: Secondary | ICD-10-CM | POA: Diagnosis not present

## 2024-03-04 DIAGNOSIS — Z1331 Encounter for screening for depression: Secondary | ICD-10-CM | POA: Diagnosis not present

## 2024-03-04 NOTE — Progress Notes (Signed)
 At a Glance:  Vitals BP: 133/84 Pulse Rate: 92 SpO2: 96 %   Anthropometric Measurements Height: 5' 3 (1.6 m) Weight: 237 lb (107.5 kg) BMI (Calculated): 41.99 Starting Weight: 237lb   Body Composition  Body Fat %: 50.2 % Fat Mass (lbs): 119 lbs Muscle Mass (lbs): 112 lbs Total Body Water (lbs): 82.8 lbs Visceral Fat Rating : 16   Other Clinical Data RMR: 1930 Fasting: yes Labs: yes Today's Visit #: 1 Starting Date: 03/04/24    EKG: Normal sinus rhythm, rate 85.  Indirect Calorimeter:   Resting Metabolic Rate ( RMR):  RMR (actual): 1930 kcal RMR (calculated): 1692 kcal The calculated basal metabolic rate is 8307 kcal  thus her basal metabolic rate is better than expected.  Plan:   Indirect calorimeter completed, interpreted and reviewed with patient today and allowed to ask questions.  Discussed the implications for the chosen plan and exercise based on the RMR reading.  Will consider repeating the RMR in the future based on weight loss.    Chief Complaint:  Obesity   Subjective:  Lynn Simpson (MR# [id number]) is a 53 y.o. female who presents for evaluation and treatment of obesity and related comorbidities.   Lynn Simpson is currently in the action stage of change and ready to dedicate time achieving and maintaining a healthier weight. Lynn Simpson is interested in becoming our patient and working on intensive lifestyle modifications including (but not limited to) diet and exercise for weight loss.  Lynn Simpson has been struggling with her weight. She has been unsuccessful in either losing weight, maintaining weight loss, or reaching her healthy weight goal.  Lynn Simpson's habits were reviewed today and are as follows: Her family eats meals together, she thinks her family will eat healthier with her, she started gaining weight with her 3rd pregnancy, she snacks frequently in the evenings, and she has problems with excessive hunger.   Current or previous pharmacotherapy: GLP-1  and Other: Saxenda  Response to medication: side effects with Saxenda, and still on the [facility] 2.4 mg.   Other Fatigue Lynn Simpson admits to daytime somnolence and admits to waking up still tired.  Lynn Simpson generally gets 4 hours of sleep per night, and states that she has difficulty falling asleep. Snoring is present. Apneic episodes are present. Epworth Sleepiness Score is 2.   Shortness of Breath Lynn Simpson notes increasing shortness of breath with exercising and seems to be worsening over time with weight gain. She notes getting out of breath sooner with activity than she used to. This has not gotten worse recently. Lynn Simpson denies shortness of breath at rest or orthopnea.  Depression Screen Lynn Simpson's Food and Mood (modified PHQ-9) score was 6. 5-9 mild depression     03/04/2024    7:10 AM  Depression screen PHQ 2/9  Decreased Interest 1  Down, Depressed, Hopeless 0  PHQ - 2 Score 1  Altered sleeping 2  Tired, decreased energy 2  Change in appetite 1  Feeling bad or failure about yourself  0  Trouble concentrating 0  Moving slowly or fidgety/restless 0  Suicidal thoughts 0  PHQ-9 Score 6  Difficult doing work/chores Very difficult     Assessment and Plan:   Other Fatigue Lynn Simpson does feel that her weight is causing her energy to be lower than it should be. Fatigue may be related to obesity, depression or many other causes. Labs will be ordered, and in the meanwhile, Lynn Simpson will focus on self care including making healthy food choices, increasing physical activity and  focusing on stress reduction.  Shortness of Breath Lynn Simpson does not feel that she gets out of breath more easily that she used to when she exercises. Lynn Simpson's shortness of breath appears to be obesity related and exercise induced. She has agreed to work on weight loss and gradually increase exercise to treat her exercise induced shortness of breath. Will continue to monitor closely.  Health Maintenance:   Obesity   Plan: Will do EKG,  indirect calorimetry, and labs.     Vitamin D  Deficiency She is at risk for vitamin D  deficiency due to obesity.  She is not on any vitamin D  Lab Results  Component Value Date   VD25OH 11.7 (L) 08/21/2018    Plan: Will check for vitamin D  deficiency.   Fatty Liver:   Unable to calculate Fibrosis 4 Score. Requires ALT, AST, and platelet count within the last year.   Plan:  Will calculate her fib 4 score at her next visit.    B 12 deficiency:  She is at risk for B12 deficiency secondary to diet, and GI issues  Plan:  Check B 12 lab today.   Previous labs reviewed today. Date: 12/2023 CMP, Lipids, and CBC  Labs done today CMP, Insulin , HgbA1c, Vit D, Vit B12, and Thyroid  Panel   Morbid Obesity: BMI (Calculated): 41.99   Lynn Simpson is currently in the action stage of change and her goal is to begin weight loss efforts. I recommend Lynn Simpson begin the structured treatment plan as follows:  She has agreed to Category 3 Plan  Exercise goals: All adults should avoid inactivity. Some activity is better than none, and adults who participate in any amount of physical activity, gain some health benefits.  Behavioral modification strategies:increasing lean protein intake, decreasing simple carbohydrates, increasing vegetables, increase H2O intake, no skipping meals, keeping healthy foods in the home, better snacking choices, avoiding temptations, and planning for success  She was informed of the importance of frequent follow-up visits to maximize her success with intensive lifestyle modifications for her multiple health conditions. She was informed we would discuss her lab results at her next visit unless there is a critical issue that needs to be addressed sooner. Lynn Simpson agreed to keep her next visit at the agreed upon time to discuss these results.  Objective:  General: Cooperative, alert, well developed, in no acute distress. HEENT: Conjunctivae and lids unremarkable. Cardiovascular:  Regular rhythm.  Lungs: Normal work of breathing. Neurologic: No focal deficits.   Lab Results  Component Value Date   CREATININE 0.73 10/12/2022   BUN 10 10/12/2022   NA 139 10/12/2022   K 4.0 10/12/2022   CL 104 10/12/2022   CO2 28 10/12/2022   Lab Results  Component Value Date   ALT 18 10/12/2022   AST 18 10/12/2022   ALKPHOS 101 10/12/2022   BILITOT 0.3 10/12/2022   Lab Results  Component Value Date   HGBA1C 5.4 08/21/2018   Lab Results  Component Value Date   INSULIN  18.6 08/21/2018   Lab Results  Component Value Date   TSH 2.00 10/12/2022   Lab Results  Component Value Date   CHOL 212 (H) 08/21/2018   HDL 59 08/21/2018   LDLCALC 137 (H) 08/21/2018   TRIG 79 08/21/2018   Lab Results  Component Value Date   WBC 9.3 10/12/2022   HGB 14.6 10/12/2022   HCT 44.0 10/12/2022   MCV 91.1 10/12/2022   PLT 198.0 10/12/2022   No results found for: IRON, TIBC, FERRITIN  Attestation  Statements:  Applicable history such as the following:  allergies, medications, problem list, medical history, surgical history, family history, social history, and previous encounter notes reviewed by clinician on day of visit:  Time spent on visit in care of the patient today including the items listed below was 45 minutes.    20 minutes were spent talking about the history, 25 minutes for face to face counseling implementing the plan, discussing the specifics of how to arrange meals, meal planning, water intake.   I spent face to face time discussing his/her plan, including breakfast, additional breakfast options, lunch, and dinner options, grocery list, and snacks.  I reviewed her indirect calorimetry. I discussed the implications for the diet plan.    Discussed the bio-impedence test (fat %, muscle mass, and water weight) and allowed the patient to ask questions.   Discussed the following information sheets: Category 3, Grocery List, 100 Calorie Snacks, 200 Calorie Snacks,  Microwave Meals, and protein shake sheet.   I reviewed the labs which were ordered from her visit on 10/12/22.   She will continue her Wegovy per another provider at this time.   I additionally spent time documenting, reviewing, and checking the codes before submitting.   This may have been prepared with the assistance of Engineer, Civil (consulting).  Occasional wrong-word or sound-a-like substitutions may have occurred due to the inherent limitations of voice recognition software.    Clayborne Daring, DO

## 2024-03-05 LAB — TSH+T4F+T3FREE
Free T4: 1.46 ng/dL (ref 0.82–1.77)
T3, Free: 3.7 pg/mL (ref 2.0–4.4)
TSH: 1.25 u[IU]/mL (ref 0.450–4.500)

## 2024-03-05 LAB — COMPREHENSIVE METABOLIC PANEL WITH GFR
ALT: 30 IU/L (ref 0–32)
AST: 20 IU/L (ref 0–40)
Albumin: 4.1 g/dL (ref 3.8–4.9)
Alkaline Phosphatase: 101 IU/L (ref 49–135)
BUN/Creatinine Ratio: 20 (ref 9–23)
BUN: 14 mg/dL (ref 6–24)
Bilirubin Total: 0.5 mg/dL (ref 0.0–1.2)
CO2: 19 mmol/L — ABNORMAL LOW (ref 20–29)
Calcium: 9.3 mg/dL (ref 8.7–10.2)
Chloride: 103 mmol/L (ref 96–106)
Creatinine, Ser: 0.7 mg/dL (ref 0.57–1.00)
Globulin, Total: 2.1 g/dL (ref 1.5–4.5)
Glucose: 79 mg/dL (ref 70–99)
Potassium: 4.6 mmol/L (ref 3.5–5.2)
Sodium: 145 mmol/L — ABNORMAL HIGH (ref 134–144)
Total Protein: 6.2 g/dL (ref 6.0–8.5)
eGFR: 105 mL/min/1.73 (ref >59)

## 2024-03-05 LAB — HEMOGLOBIN A1C
Est. average glucose Bld gHb Est-mCnc: 108 mg/dL
Hgb A1c MFr Bld: 5.4 % (ref 4.8–5.6)

## 2024-03-05 LAB — VITAMIN B12: Vitamin B-12: 321 pg/mL (ref 232–1245)

## 2024-03-05 LAB — INSULIN, RANDOM: INSULIN: 9.2 u[IU]/mL (ref 2.6–24.9)

## 2024-03-05 LAB — VITAMIN D 25 HYDROXY (VIT D DEFICIENCY, FRACTURES): Vit D, 25-Hydroxy: 12.6 ng/mL — ABNORMAL LOW (ref 30.0–100.0)

## 2024-03-06 ENCOUNTER — Encounter (INDEPENDENT_AMBULATORY_CARE_PROVIDER_SITE_OTHER): Payer: Self-pay | Admitting: Bariatrics

## 2024-03-06 DIAGNOSIS — E559 Vitamin D deficiency, unspecified: Secondary | ICD-10-CM | POA: Insufficient documentation

## 2024-03-07 ENCOUNTER — Encounter: Payer: Self-pay | Admitting: Gastroenterology

## 2024-03-18 ENCOUNTER — Encounter: Payer: Self-pay | Admitting: Bariatrics

## 2024-03-18 ENCOUNTER — Ambulatory Visit: Admitting: Bariatrics

## 2024-03-18 VITALS — BP 132/83 | HR 99 | Ht 63.0 in | Wt 233.0 lb

## 2024-03-18 DIAGNOSIS — E559 Vitamin D deficiency, unspecified: Secondary | ICD-10-CM | POA: Diagnosis not present

## 2024-03-18 DIAGNOSIS — K760 Fatty (change of) liver, not elsewhere classified: Secondary | ICD-10-CM | POA: Diagnosis not present

## 2024-03-18 DIAGNOSIS — Z6841 Body Mass Index (BMI) 40.0 and over, adult: Secondary | ICD-10-CM

## 2024-03-18 DIAGNOSIS — E66813 Obesity, class 3: Secondary | ICD-10-CM

## 2024-03-18 MED ORDER — VITAMIN D (ERGOCALCIFEROL) 1.25 MG (50000 UNIT) PO CAPS: 50000.0000 [IU] | ORAL_CAPSULE | ORAL | 0 refills | Status: AC

## 2024-03-18 NOTE — Progress Notes (Signed)
 "                                                                                                                        First follow-up after initial visit.        WEIGHT SUMMARY AND BIOMETRICS  Weight Lost Since Last Visit: 4lb  Weight Gained Since Last Visit: 0   Vitals BP: 132/83 Pulse Rate: 99 SpO2: 94 %   Anthropometric Measurements Height: 5' 3 (1.6 m) Weight: 233 lb (105.7 kg) BMI (Calculated): 41.28 Weight at Last Visit: 237lb Weight Lost Since Last Visit: 4lb Weight Gained Since Last Visit: 0 Starting Weight: 237lb Total Weight Loss (lbs): 4 lb (1.814 kg)   Body Composition  Body Fat %: 50.1 % Fat Mass (lbs): 116.8 lbs Muscle Mass (lbs): 110.6 lbs Total Body Water (lbs): 82.4 lbs Visceral Fat Rating : 15   Other Clinical Data Fasting: no Labs: no Today's Visit #: 2 Starting Date: 03/04/24    OBESITY Lynn Simpson is here to discuss her progress with her obesity treatment plan along with follow-up of her obesity related diagnoses.    Nutrition Plan: the Category 3 plan - 0% adherence.  Current exercise: none  Interim History:  She is down 4 lbs since her initial visit.  Eating all of the food on the plan., Protein intake is as prescribed, Is not skipping meals, and Water intake is adequate.  Initial positives regarding the dietary plan:  Initial challenges regarding  the dietary plan:   Pharmacotherapy: Lynn Simpson is not on any anti-obesity medications. Hunger is well controlled.  Cravings are well controlled.  Assessment/Plan:   Vitamin D  Deficiency Vitamin D  is not at goal of 50.  Most recent vitamin D  level was 12.6. She is on  prescription ergocalciferol  50,000 IU weekly. Lab Results  Component Value Date   VD25OH 12.6 (L) 03/04/2024   VD25OH 11.7 (L) 08/21/2018    Plan: Begin prescription vitamin D  50,000 IU weekly.  She will additionally begin a multivitamin, B12, vitamin C at 1000 mg daily, and calcium 1200 mg daily.  She will talk with her  OB/GYN in regard to her women's care issues and hormonal replacement.  Fatty liver:  She is taking Colestid  and working on weight loss. Her liver enzymes were normal.  She will continue her Wegovy through her current provider.  Plan:  Will continue continue diet and exercise.     Morbid Obesity: Current BMI BMI (Calculated): 41.28   Pharmacotherapy Plan Continue  Wegovy 2.4 mg SQ weekly ( On Wegovy since August 2025) and states that her original weight was 275 lbs   Lynn Simpson is currently in the action stage of change. As such, her goal is to continue with weight loss efforts.  She has agreed to the Category 3 plan.  Exercise goals: All adults should avoid inactivity. Some physical activity is better than none, and adults who participate in any amount of physical activity gain some health benefits.  Behavioral modification strategies: increasing  lean protein intake, meal planning , increase water intake, better snacking choices, planning for success, increasing vegetables, increasing fiber rich foods, avoiding temptations, keep healthy foods in the home, weigh protein portions, measure portion sizes, and mindful eating.  Lynn Simpson has agreed to follow-up with our clinic in 2 weeks.   Labs reviewed today from last visit (CMP, HgbA1c, insulin , vitamin D , B 12, and thyroid  panel).   Objective:   VITALS: Per patient if applicable, see vitals. GENERAL: Alert and in no acute distress. CARDIOPULMONARY: No increased WOB. Speaking in clear sentences.  PSYCH: Pleasant and cooperative. Speech normal rate and rhythm. Affect is appropriate. Insight and judgement are appropriate. Attention is focused, linear, and appropriate.  NEURO: Oriented as arrived to appointment on time with no prompting.   Attestation Statements:   This was prepared with the assistance of Engineer, Civil (consulting).  Occasional wrong-word or sound-a-like substitutions may have occurred due to the inherent limitations of voice  recognition software.   Lynn Daring, DO  "

## 2024-03-30 ENCOUNTER — Other Ambulatory Visit: Payer: Self-pay | Admitting: Gastroenterology

## 2024-04-04 ENCOUNTER — Encounter: Payer: Self-pay | Admitting: Bariatrics

## 2024-04-04 ENCOUNTER — Ambulatory Visit: Admitting: Bariatrics

## 2024-04-04 VITALS — BP 139/85 | HR 107 | Ht 63.0 in | Wt 231.0 lb

## 2024-04-04 DIAGNOSIS — R0683 Snoring: Secondary | ICD-10-CM

## 2024-04-04 DIAGNOSIS — Z6841 Body Mass Index (BMI) 40.0 and over, adult: Secondary | ICD-10-CM

## 2024-04-04 DIAGNOSIS — E559 Vitamin D deficiency, unspecified: Secondary | ICD-10-CM

## 2024-04-04 NOTE — Progress Notes (Signed)
 "                                                                                                             WEIGHT SUMMARY AND BIOMETRICS  Weight Lost Since Last Visit: 2lb  Weight Gained Since Last Visit: 0   Vitals BP: 139/85 Pulse Rate: (!) 107 SpO2: 96 %   Anthropometric Measurements Height: 5' 3 (1.6 m) Weight: 231 lb (104.8 kg) BMI (Calculated): 40.93 Weight at Last Visit: 233lb Weight Lost Since Last Visit: 2lb Weight Gained Since Last Visit: 0 Starting Weight: 237lb Total Weight Loss (lbs): 6 lb (2.722 kg)   Body Composition  Body Fat %: 49.3 % Fat Mass (lbs): 114 lbs Muscle Mass (lbs): 111.4 lbs Total Body Water (lbs): 83.4 lbs Visceral Fat Rating : 15   Other Clinical Data Fasting: no Labs: no Today's Visit #: 3 Starting Date: 03/04/24    OBESITY Lynn Simpson is here to discuss her progress with her obesity treatment plan along with follow-up of her obesity related diagnoses.    Nutrition Plan: the Category 3 plan - 70% adherence.  Current exercise: none  Interim History:  She is down 2 lbs since her last visit. She has been Prednisone since her last visit.  Eating all of the food on the plan., Protein intake is as prescribed, and Water intake is adequate.   Pharmacotherapy: Lynn Simpson is on Wegovy 2.4 mg SQ weekly Adverse side effects: None Hunger is moderately controlled.  Cravings are moderately controlled.  Assessment/Plan:   Snoring:  She states that she believes that she has sleep apnea (snoring loudly, some apnea, and non-restorative sleep).   Plan:  Referral for a sleep study.   Vitamin D  Deficiency Vitamin D  is not at goal of 50.  Most recent vitamin D  level was 12.6. She is on  prescription ergocalciferol  50,000 IU weekly. Lab Results  Component Value Date   VD25OH 12.6 (L) 03/04/2024   VD25OH 11.7 (L) 08/21/2018    Plan: Continue prescription vitamin D  50,000 IU weekly.  Will read labels.  Will do some journaling of food and  calories.    Morbid Obesity: Current BMI BMI (Calculated): 40.93   Pharmacotherapy Plan Continue and refill  Wegovy 2.4 mg SQ weekly  Lynn Simpson is currently in the action stage of change. As such, her goal is to continue with weight loss efforts.  She has agreed to the Category 3 plan.  Exercise goals: All adults should avoid inactivity. Some physical activity is better than none, and adults who participate in any amount of physical activity gain some health benefits.  Behavioral modification strategies: increasing lean protein intake, no meal skipping, meal planning , increase water intake, better snacking choices, planning for success, increasing vegetables, avoiding temptations, keep healthy foods in the home, measure portion sizes, work on smaller portions, and pack lunch for work.  Lynn Simpson has agreed to follow-up with our clinic in 1 month.   Objective:   VITALS: Per patient if applicable, see vitals. GENERAL: Alert and in no acute distress. CARDIOPULMONARY: No  increased WOB. Speaking in clear sentences.  PSYCH: Pleasant and cooperative. Speech normal rate and rhythm. Affect is appropriate. Insight and judgement are appropriate. Attention is focused, linear, and appropriate.  NEURO: Oriented as arrived to appointment on time with no prompting.   Attestation Statements:   This was prepared with the assistance of Engineer, Civil (consulting).  Occasional wrong-word or sound-a-like substitutions may have occurred due to the inherent limitations of voice recognition.   Clayborne Daring, DO    "

## 2024-04-17 ENCOUNTER — Other Ambulatory Visit: Payer: Self-pay | Admitting: Medical Genetics

## 2024-04-17 ENCOUNTER — Ambulatory Visit (AMBULATORY_SURGERY_CENTER)

## 2024-04-17 ENCOUNTER — Encounter: Payer: Self-pay | Admitting: Gastroenterology

## 2024-04-17 VITALS — Ht 63.0 in | Wt 229.0 lb

## 2024-04-17 DIAGNOSIS — Z1509 Genetic susceptibility to other malignant neoplasm: Secondary | ICD-10-CM

## 2024-04-17 DIAGNOSIS — Z15060 Genetic susceptibility to colorectal cancer: Secondary | ICD-10-CM

## 2024-04-17 DIAGNOSIS — Z15068 Genetic susceptibility to other malignant neoplasm of digestive system: Secondary | ICD-10-CM

## 2024-04-17 DIAGNOSIS — Z1507 Genetic susceptibility to malignant neoplasm of urinary tract: Secondary | ICD-10-CM

## 2024-04-17 MED ORDER — NA SULFATE-K SULFATE-MG SULF 17.5-3.13-1.6 GM/177ML PO SOLN: 1.0000 | Freq: Once | ORAL | 0 refills | Status: AC

## 2024-04-17 NOTE — Progress Notes (Signed)
 RN confirmed patient name, date of birth, and address RN confirmed date and time of procedure RN reviewed and confirmed allergies  RN reviewed and updated current medications; confirmed preferred pharmacy Pt is not on diet pills nor GLP-1 medications Pt is not on blood thinners RN reviewed medical & surgical hx  Pt denies issues with chronic constipation  Diabetic - no No A fib or A flutter No cardiac tests are pending  Pt is not on home 02  No issues known with past sedation with any surgeries or procedures Patient denies ever being told they had issues or difficulty with intubation  Patient unaware of any fh of malignant hyperthermia Ambulates independently RN reviewed prep instructions and explained time frames for holding certain medications RN answered patient questions; patient stated understanding Prep instructions sent via My Chart

## 2024-04-23 ENCOUNTER — Ambulatory Visit: Admitting: Bariatrics

## 2024-05-01 ENCOUNTER — Encounter: Admitting: Gastroenterology

## 2024-05-14 ENCOUNTER — Ambulatory Visit: Admitting: Bariatrics
